# Patient Record
Sex: Female | Born: 1955 | Race: White | Hispanic: No | Marital: Married | Smoking: Former smoker
Health system: Southern US, Community
[De-identification: ages and names within clinical notes are randomized; demographics above are authoritative.]

## PROBLEM LIST (undated history)

## (undated) DIAGNOSIS — Z8709 Personal history of other diseases of the respiratory system: Secondary | ICD-10-CM

## (undated) DIAGNOSIS — T7840XA Allergy, unspecified, initial encounter: Secondary | ICD-10-CM

## (undated) DIAGNOSIS — G5603 Carpal tunnel syndrome, bilateral upper limbs: Secondary | ICD-10-CM

## (undated) DIAGNOSIS — Z8759 Personal history of other complications of pregnancy, childbirth and the puerperium: Secondary | ICD-10-CM

## (undated) DIAGNOSIS — E782 Mixed hyperlipidemia: Secondary | ICD-10-CM

## (undated) DIAGNOSIS — N183 Chronic kidney disease, stage 3 unspecified: Secondary | ICD-10-CM

## (undated) DIAGNOSIS — J302 Other seasonal allergic rhinitis: Secondary | ICD-10-CM

## (undated) DIAGNOSIS — L281 Prurigo nodularis: Secondary | ICD-10-CM

## (undated) DIAGNOSIS — G894 Chronic pain syndrome: Secondary | ICD-10-CM

## (undated) DIAGNOSIS — M797 Fibromyalgia: Secondary | ICD-10-CM

## (undated) DIAGNOSIS — R011 Cardiac murmur, unspecified: Secondary | ICD-10-CM

## (undated) DIAGNOSIS — G43909 Migraine, unspecified, not intractable, without status migrainosus: Secondary | ICD-10-CM

## (undated) DIAGNOSIS — M25562 Pain in left knee: Secondary | ICD-10-CM

## (undated) DIAGNOSIS — K219 Gastro-esophageal reflux disease without esophagitis: Secondary | ICD-10-CM

## (undated) DIAGNOSIS — R7303 Prediabetes: Secondary | ICD-10-CM

## (undated) DIAGNOSIS — N2889 Other specified disorders of kidney and ureter: Secondary | ICD-10-CM

## (undated) DIAGNOSIS — I10 Essential (primary) hypertension: Secondary | ICD-10-CM

## (undated) DIAGNOSIS — M170 Bilateral primary osteoarthritis of knee: Secondary | ICD-10-CM

## (undated) DIAGNOSIS — R7301 Impaired fasting glucose: Secondary | ICD-10-CM

## (undated) DIAGNOSIS — Q600 Renal agenesis, unilateral: Secondary | ICD-10-CM

## (undated) DIAGNOSIS — M25561 Pain in right knee: Secondary | ICD-10-CM

## (undated) DIAGNOSIS — H269 Unspecified cataract: Secondary | ICD-10-CM

## (undated) DIAGNOSIS — K649 Unspecified hemorrhoids: Secondary | ICD-10-CM

## (undated) DIAGNOSIS — M47816 Spondylosis without myelopathy or radiculopathy, lumbar region: Secondary | ICD-10-CM

## (undated) DIAGNOSIS — C801 Malignant (primary) neoplasm, unspecified: Secondary | ICD-10-CM

## (undated) DIAGNOSIS — H8110 Benign paroxysmal vertigo, unspecified ear: Secondary | ICD-10-CM

## (undated) DIAGNOSIS — E663 Overweight: Secondary | ICD-10-CM

## (undated) DIAGNOSIS — Z85528 Personal history of other malignant neoplasm of kidney: Secondary | ICD-10-CM

## (undated) HISTORY — DX: Gastro-esophageal reflux disease without esophagitis: K21.9

## (undated) HISTORY — DX: Carpal tunnel syndrome, bilateral upper limbs: G56.03

## (undated) HISTORY — DX: Bilateral primary osteoarthritis of knee: M17.0

## (undated) HISTORY — DX: Chronic kidney disease, stage 3 unspecified: N18.30

## (undated) HISTORY — DX: Pain in left knee: M25.562

## (undated) HISTORY — DX: Other specified disorders of kidney and ureter: N28.89

## (undated) HISTORY — PX: TONSILLECTOMY: SUR1361

## (undated) HISTORY — DX: Personal history of other diseases of the respiratory system: Z87.09

## (undated) HISTORY — DX: Chronic pain syndrome: G89.4

## (undated) HISTORY — DX: Other seasonal allergic rhinitis: J30.2

## (undated) HISTORY — DX: Cardiac murmur, unspecified: R01.1

## (undated) HISTORY — DX: Migraine, unspecified, not intractable, without status migrainosus: G43.909

## (undated) HISTORY — PX: OTHER SURGICAL HISTORY: SHX169

## (undated) HISTORY — PX: TUBAL LIGATION: SHX77

## (undated) HISTORY — DX: Impaired fasting glucose: R73.01

## (undated) HISTORY — DX: Prurigo nodularis: L28.1

## (undated) HISTORY — DX: Spondylosis without myelopathy or radiculopathy, lumbar region: M47.816

## (undated) HISTORY — DX: Unspecified cataract: H26.9

## (undated) HISTORY — DX: Pain in right knee: M25.561

## (undated) HISTORY — DX: Mixed hyperlipidemia: E78.2

## (undated) HISTORY — DX: Personal history of other complications of pregnancy, childbirth and the puerperium: Z87.59

## (undated) HISTORY — DX: Allergy, unspecified, initial encounter: T78.40XA

## (undated) HISTORY — DX: Renal agenesis, unilateral: Q60.0

## (undated) HISTORY — DX: Personal history of other malignant neoplasm of kidney: Z85.528

## (undated) HISTORY — DX: Benign paroxysmal vertigo, unspecified ear: H81.10

## (undated) HISTORY — DX: Fibromyalgia: M79.7

## (undated) HISTORY — DX: Chronic kidney disease, stage 3 (moderate): N18.3

## (undated) HISTORY — DX: Malignant (primary) neoplasm, unspecified: C80.1

## (undated) HISTORY — DX: Overweight: E66.3

---

## 2001-08-23 ENCOUNTER — Other Ambulatory Visit: Admission: RE | Admit: 2001-08-23 | Discharge: 2001-08-23 | Payer: Self-pay | Admitting: Gynecology

## 2009-09-07 ENCOUNTER — Encounter: Admission: RE | Admit: 2009-09-07 | Discharge: 2009-09-07 | Payer: Self-pay | Admitting: Family Medicine

## 2010-08-04 ENCOUNTER — Encounter: Payer: Self-pay | Admitting: Family Medicine

## 2011-01-30 ENCOUNTER — Encounter: Payer: Self-pay | Admitting: Family Medicine

## 2011-01-31 ENCOUNTER — Encounter: Payer: Self-pay | Admitting: Family Medicine

## 2011-01-31 ENCOUNTER — Ambulatory Visit (INDEPENDENT_AMBULATORY_CARE_PROVIDER_SITE_OTHER): Payer: BC Managed Care – PPO | Admitting: Family Medicine

## 2011-01-31 ENCOUNTER — Other Ambulatory Visit: Payer: Self-pay | Admitting: Family Medicine

## 2011-01-31 VITALS — BP 148/84 | HR 75 | Temp 98.1°F | Ht 62.25 in | Wt 169.0 lb

## 2011-01-31 DIAGNOSIS — Z8759 Personal history of other complications of pregnancy, childbirth and the puerperium: Secondary | ICD-10-CM

## 2011-01-31 DIAGNOSIS — Z8639 Personal history of other endocrine, nutritional and metabolic disease: Secondary | ICD-10-CM

## 2011-01-31 DIAGNOSIS — Z862 Personal history of diseases of the blood and blood-forming organs and certain disorders involving the immune mechanism: Secondary | ICD-10-CM

## 2011-01-31 DIAGNOSIS — R5383 Other fatigue: Secondary | ICD-10-CM

## 2011-01-31 DIAGNOSIS — K219 Gastro-esophageal reflux disease without esophagitis: Secondary | ICD-10-CM

## 2011-01-31 DIAGNOSIS — R1310 Dysphagia, unspecified: Secondary | ICD-10-CM

## 2011-01-31 DIAGNOSIS — T7840XA Allergy, unspecified, initial encounter: Secondary | ICD-10-CM

## 2011-01-31 DIAGNOSIS — Z0000 Encounter for general adult medical examination without abnormal findings: Secondary | ICD-10-CM

## 2011-01-31 DIAGNOSIS — IMO0001 Reserved for inherently not codable concepts without codable children: Secondary | ICD-10-CM

## 2011-01-31 DIAGNOSIS — R030 Elevated blood-pressure reading, without diagnosis of hypertension: Secondary | ICD-10-CM

## 2011-01-31 DIAGNOSIS — M25569 Pain in unspecified knee: Secondary | ICD-10-CM

## 2011-01-31 DIAGNOSIS — R5381 Other malaise: Secondary | ICD-10-CM

## 2011-01-31 DIAGNOSIS — E663 Overweight: Secondary | ICD-10-CM | POA: Insufficient documentation

## 2011-01-31 DIAGNOSIS — M25561 Pain in right knee: Secondary | ICD-10-CM

## 2011-01-31 DIAGNOSIS — H547 Unspecified visual loss: Secondary | ICD-10-CM

## 2011-01-31 MED ORDER — RANITIDINE HCL 300 MG PO TABS: 300.0000 mg | ORAL_TABLET | Freq: Every day | ORAL | Status: DC

## 2011-01-31 NOTE — Patient Instructions (Addendum)
Heartburn Heartburn is a painful, burning sensation in the chest. It may feel worse in certain positions, such as lying down or bending over. It is caused by stomach acid backing up into the tube that carries food from the mouth down to the stomach (lower esophagus).  CAUSES A number of conditions can cause or worsen heartburn, including:   Pregnancy.   Being overweight (obesity).   A condition called hiatal hernia, in which part or all of the stomach is moved up into the chest through a weakness in the diaphragm muscle.   Alcohol.   Exercise.   Eating just before going to bed.   Overeating.   Medications, including:   Nonsteroidal anti-inflammatory drugs, such as ibuprofen and naproxen.   Aspirin.   Some blood pressure medicines, including beta-blockers, calcium channel blockers, and alpha-blockers.   Nitrates (used to treat angina).   The asthma medication Theophylline.   Certain sedative drugs.   Heartburn may be worse after eating certain foods. These heartburn-causing foods are different for different people, but may include:   Peppers.   Chocolate.   Coffee.   High-fat foods, including fried foods.   Spicy foods.   Garlic, onions.   Citrus fruits, including oranges, grapefruit, lemons and limes.   Food containing tomatoes or tomato products.   Mint.   Carbonated beverages.   Vinegar.  SYMPTOMS  Symptoms may last for a few minutes or a few hours, and can include:  Burning pain in the chest or lower throat.   Bitter taste in the mouth.   Coughing.  DIAGNOSIS If the usual treatments for heartburn do not improve your symptoms, then tests may be done to see if there is another condition present. Possible tests may include:  X-rays.   Endoscopy. This is when a tube with a light and a camera on the end is used to examine the esophagus and the stomach.   Blood, breath, or stool tests may be used to check for bacteria that cause ulcers.   TREATMENT There are a number of non-prescription medicines used to treat heartburn, including:  Antacids.   Acid reducers (also called H-2 blockers).   Proton-pump inhibitors.  HOME CARE INSTRUCTIONS  Raise the head of your bed by putting blocks under the legs.   Eat 2-3 hours before going to bed.   Stop smoking.   Try to reach and maintain a healthy weight.   Do not eat just a few very large meals. Instead, eat many smaller meals throughout the day.   Try to identify foods and beverages that make your symptoms worse, and avoid these.   Avoid tight clothing.   Do not exercise right after eating.  SEEK IMMEDIATE MEDICAL CARE IF YOU:  Have severe chest pain that goes down your arm, or into your jaw or neck.   Feel sweaty, dizzy, or lightheaded.   Are short of breath.   Throw up (vomit) blood.   Have difficulty or pain with swallowing.   Have bloody or black, tarry stools.   Have bouts of heartburn more than three times a week for more than two weeks.  Document Released: 11/16/2008 Document Re-Released: 09/24/2009 [facility] Patient Information 2011 [place], [place]. Consider Alavert daily or try Cetirizine twice a day

## 2011-02-01 LAB — CBC WITH DIFFERENTIAL/PLATELET
Basophils Absolute: 0 10*3/uL (ref 0.0–0.1)
Eosinophils Absolute: 0.2 10*3/uL (ref 0.0–0.7)
Hemoglobin: 13.3 g/dL (ref 12.0–15.0)
Lymphocytes Relative: 43 % (ref 12–46)
MCH: 30.9 pg (ref 26.0–34.0)
MCHC: 32.8 g/dL (ref 30.0–36.0)
Neutro Abs: 3.8 10*3/uL (ref 1.7–7.7)
Platelets: 313 10*3/uL (ref 150–400)
RBC: 4.3 MIL/uL (ref 3.87–5.11)
RDW: 13.5 % (ref 11.5–15.5)
WBC: 7.9 10*3/uL (ref 4.0–10.5)

## 2011-02-01 LAB — HEPATIC FUNCTION PANEL
ALT: 15 U/L (ref 0–35)
Bilirubin, Direct: 0.1 mg/dL (ref 0.0–0.3)
Indirect Bilirubin: 0.5 mg/dL (ref 0.0–0.9)
Total Protein: 7.4 g/dL (ref 6.0–8.3)

## 2011-02-01 LAB — BASIC METABOLIC PANEL
BUN: 13 mg/dL (ref 6–23)
Chloride: 102 mEq/L (ref 96–112)
Glucose, Bld: 87 mg/dL (ref 70–99)
Sodium: 139 mEq/L (ref 135–145)

## 2011-02-01 LAB — LIPID PANEL
HDL: 77 mg/dL (ref >39)
VLDL: 41 mg/dL — ABNORMAL HIGH (ref 0–40)

## 2011-02-01 LAB — TSH: TSH: 0.901 u[IU]/mL (ref 0.350–4.500)

## 2011-02-01 LAB — T4, FREE: Free T4: 1.16 ng/dL (ref 0.80–1.80)

## 2011-02-05 ENCOUNTER — Encounter: Payer: Self-pay | Admitting: Family Medicine

## 2011-02-05 DIAGNOSIS — M25561 Pain in right knee: Secondary | ICD-10-CM

## 2011-02-05 DIAGNOSIS — Z8759 Personal history of other complications of pregnancy, childbirth and the puerperium: Secondary | ICD-10-CM

## 2011-02-05 DIAGNOSIS — Z0000 Encounter for general adult medical examination without abnormal findings: Secondary | ICD-10-CM | POA: Insufficient documentation

## 2011-02-05 DIAGNOSIS — H547 Unspecified visual loss: Secondary | ICD-10-CM | POA: Insufficient documentation

## 2011-02-05 DIAGNOSIS — M25562 Pain in left knee: Secondary | ICD-10-CM

## 2011-02-05 HISTORY — DX: Personal history of other complications of pregnancy, childbirth and the puerperium: Z87.59

## 2011-02-05 HISTORY — DX: Pain in right knee: M25.561

## 2011-02-05 HISTORY — DX: Pain in left knee: M25.562

## 2011-02-05 NOTE — Progress Notes (Signed)
[name] [id number] Dec 29, 1955 02/05/2011      Progress Note New Patient  Subjective  Chief Complaint  Chief Complaint  Patient presents with  . Annual Exam    neck problems    HPI  Patient is a 55 year old Caucasian female in today for urgent appointment. She has her blood pressures recently been trending up. Denies headaches, chest pain, palpitations, shortness of breath. Has swelling with some heartburn. Some epigastric discomfort some bloating some belching. No change in bowel habits, bloody or or stool. No anorexia nausea or diarrhea. No recent illness, fevers, chills. She follows with Dr. Orvan Falconer at the [facility] for her Paps and mammograms last exam was roughly 2 years ago. She has some trouble with stiffness and discomfort in her knees at times but no swelling erythema or injury. Did have some difficulties with a corn on his left foot. As a child she had trouble with recurrent strep and bronchitis but has not had difficulty as an adult. Does trouble with allergies intermittently with congestion and pruritus responds well to sublingual Alavert.  Past Medical History  Diagnosis Date  . Overweight   . History of bronchitis     childhood, wood stove and coal heat  . History of galactorrhea 02/05/2011  . Knee pain, bilateral 02/05/2011  . Allergy   . GERD (gastroesophageal reflux disease)   . Heart murmur   . Preventative health care 02/05/2011  . Elevated BP 02/05/2011    Past Surgical History  Procedure Date  . Cesarean section 1983  . Tubal ligation     Family History  Problem Relation Age of Onset  . Thyroid disease Mother   . Heart disease Mother     CHF, heart murmur, PAD  . Kidney disease Mother     RAS  . Cancer Father     colon  . Cancer Sister     cervical, breast  . Thyroid disease Sister   . Heart disease Brother   . Thyroid disease Maternal Grandfather   . Cancer Maternal Grandfather   . Diabetes Paternal Grandmother   . Cancer Paternal  Grandmother     breast  . Heart disease Paternal Grandmother   . Heart disease Paternal Grandfather   . Diabetes Paternal Grandfather   . Hypertension Paternal Grandfather   . Thyroid disease Sister   . Diabetes Sister   . Diabetes Sister   . Hypertension Sister   . Cancer Paternal Aunt     breast cancer  . Heart disease Maternal Grandmother   . Thyroid disease Maternal Grandmother   . Cancer Sister     kidney    History   Social History  . Marital Status: Married    Spouse Name: Sharl Ma    Number of Children: 3  . Years of Education: N/A   Occupational History  . Not on file.   Social History Main Topics  . Smoking status: Former Smoker -- 2.0 packs/day for 5 years    Types: Cigarettes    Quit date: 07/14/1981  . Smokeless tobacco: Never Used  . Alcohol Use: No  . Drug Use: No  . Sexually Active: Not on file   Other Topics Concern  . Not on file   Social History Narrative  . No narrative on file    No current outpatient prescriptions on file prior to visit.    Allergies  Allergen Reactions  . Codeine Nausea Only    Review of Systems  Review of Systems  Constitutional: Negative  for fever, chills and malaise/fatigue.  HENT: Positive for congestion. Negative for hearing loss and nosebleeds.   Eyes: Negative for pain and discharge.  Respiratory: Negative for cough, sputum production, shortness of breath and wheezing.   Cardiovascular: Negative for chest pain, palpitations and leg swelling.  Gastrointestinal: Positive for heartburn and nausea. Negative for vomiting, abdominal pain, diarrhea, constipation and blood in stool.  Genitourinary: Negative for dysuria, urgency, frequency and hematuria.  Musculoskeletal: Positive for joint pain. Negative for myalgias, back pain and falls.       B/l knees  Skin: Negative for rash.  Neurological: Negative for dizziness, tremors, sensory change, focal weakness, loss of consciousness, weakness and headaches.    Endo/Heme/Allergies: Negative for polydipsia. Does not bruise/bleed easily.  Psychiatric/Behavioral: Negative for depression and suicidal ideas. The patient is not nervous/anxious and does not have insomnia.     Objective  BP 148/84  Pulse 75  Temp(Src) 98.1 F (36.7 C) (Oral)  Ht 5' 2.25" (1.581 m)  Wt 169 lb (76.658 kg)  BMI 30.66 kg/m2  SpO2 97%  Physical Exam  Physical Exam  Constitutional: She is oriented to person, place, and time and well-developed, well-nourished, and in no distress. No distress.  HENT:  Head: Normocephalic and atraumatic.  Right Ear: External ear normal.  Left Ear: External ear normal.  Nose: Nose normal.  Mouth/Throat: Oropharynx is clear and moist. No oropharyngeal exudate.  Eyes: Conjunctivae are normal. Pupils are equal, round, and reactive to light. Right eye exhibits no discharge. Left eye exhibits no discharge. No scleral icterus.  Neck: Normal range of motion. Neck supple. No thyromegaly present.  Cardiovascular: Normal rate, regular rhythm, normal heart sounds and intact distal pulses.   No murmur heard. Pulmonary/Chest: Effort normal and breath sounds normal. No respiratory distress. She has no wheezes. She has no rales.  Abdominal: Soft. Bowel sounds are normal. She exhibits no distension and no mass. There is no tenderness.  Musculoskeletal: Normal range of motion. She exhibits no edema and no tenderness.  Lymphadenopathy:    She has no cervical adenopathy.  Neurological: She is alert and oriented to person, place, and time. She has normal reflexes. No cranial nerve deficit. Coordination normal.  Skin: Skin is warm and dry. No rash noted. She is not diaphoretic.  Psychiatric: Mood, memory and affect normal.       Assessment & Plan  Allergic state Symptoms vary patient encouraged to try Zyrtec once to twice daily when symptoms flare.  Preventative health care Encouraged her to proceed with annual fasting labs prior to next visit.    Knee pain, bilateral Intermittent, stiff as well at times, consider fish oil caps and stay as active as tolerated.  Elevated BP Patient encouraged to minimize sodium and caffeine until seen again, will continue to monitor, patient will notify us if she becomes symptomatic  GERD (gastroesophageal reflux disease) Avoid trans fats, offending foods, consider Ginger prn and Tums prn, if symptoms worsen can use Ranitidine prescription provided

## 2011-02-05 NOTE — Assessment & Plan Note (Addendum)
Avoid trans fats, offending foods, consider Ginger prn and Tums prn, if symptoms worsen can use Ranitidine prescription provided. She does note some dysphagia at times.

## 2011-02-05 NOTE — Assessment & Plan Note (Signed)
Intermittent, stiff as well at times, consider fish oil caps and stay as active as tolerated.

## 2011-02-05 NOTE — Assessment & Plan Note (Signed)
Patient encouraged to minimize sodium and caffeine until seen again, will continue to monitor, patient will notify us if she becomes symptomatic

## 2011-02-05 NOTE — Assessment & Plan Note (Signed)
Encouraged her to proceed with annual fasting labs prior to next visit.

## 2011-02-05 NOTE — Assessment & Plan Note (Signed)
Symptoms vary patient encouraged to try Zyrtec once to twice daily when symptoms flare.

## 2011-02-26 ENCOUNTER — Other Ambulatory Visit: Payer: BC Managed Care – PPO

## 2011-03-05 ENCOUNTER — Encounter: Payer: Self-pay | Admitting: Family Medicine

## 2011-03-05 ENCOUNTER — Ambulatory Visit (INDEPENDENT_AMBULATORY_CARE_PROVIDER_SITE_OTHER): Payer: BC Managed Care – PPO | Admitting: Family Medicine

## 2011-03-05 VITALS — BP 139/80 | HR 63 | Temp 98.0°F | Ht 62.25 in | Wt 166.1 lb

## 2011-03-05 DIAGNOSIS — R030 Elevated blood-pressure reading, without diagnosis of hypertension: Secondary | ICD-10-CM

## 2011-03-05 DIAGNOSIS — J0391 Acute recurrent tonsillitis, unspecified: Secondary | ICD-10-CM

## 2011-03-05 DIAGNOSIS — E785 Hyperlipidemia, unspecified: Secondary | ICD-10-CM

## 2011-03-05 DIAGNOSIS — J039 Acute tonsillitis, unspecified: Secondary | ICD-10-CM

## 2011-03-05 DIAGNOSIS — Z8709 Personal history of other diseases of the respiratory system: Secondary | ICD-10-CM

## 2011-03-05 DIAGNOSIS — IMO0001 Reserved for inherently not codable concepts without codable children: Secondary | ICD-10-CM

## 2011-03-05 NOTE — Patient Instructions (Signed)
Hypercholesterolemia High Blood Cholesterol Cholesterol is a white, waxy, fat-like protein needed by your body in small amounts. The liver makes all the cholesterol you need. It is carried from the liver by the blood through the blood vessels. Deposits (plaque) may build up on blood vessel walls. This makes the arteries narrower and stiffer. Plaque increases the risk for heart attack and stroke. You cannot feel your cholesterol level even if it is very high. The only way to know is by a blood test to check your lipid (fats) levels. Once you know your cholesterol levels, you should keep a record of the test results. Work with your caregiver to to keep your levels in the desired range. WHAT THE RESULTS MEAN:  Total cholesterol is a rough measure of all the cholesterol in your blood.   LDL is the so-called bad cholesterol. This is the type that deposits cholesterol in the walls of the arteries. You want this level to be low.   HDL is the good cholesterol because it cleans the arteries and carries the LDL away. You want this level to be high.   Triglycerides are fat that the body can either burn for energy or store. High levels are closely linked to heart disease.  DESIRED LEVELS:  Total cholesterol below 200.   LDL below 100 for people at risk, below 70 for very high risk.   HDL above 50 is good, above 60 is best.   Triglycerides below 150.  HOW TO LOWER YOUR CHOLESTEROL:  Diet.   Choose fish or white meat chicken and [place], roasted or baked. Limit fatty cuts of red meat, fried foods, and processed meats, such as sausage and lunch meat.   Eat lots of fresh fruits and vegetables. Choose whole grains, beans, pasta, potatoes and cereals.   Use only small amounts of olive, corn or canola oils. Avoid butter, mayonnaise, shortening or palm kernel oils. Avoid foods with trans-fats.   Use skim/nonfat milk and low-fat/nonfat yogurt and cheeses. Avoid whole milk, cream, ice cream, egg yolks and  cheeses. Healthy desserts include angel food cake, gingersnaps, animal crackers, hard candy, popsicles, and low-fat/nonfat frozen yogurt. Avoid pastries, cakes, pies and cookies.   Exercise.   A regular program helps decrease LDL and raises HDL.   Helps with weight control.   Do things that increase your activity level like gardening, walking, or taking the stairs.   Medication.   May be prescribed by your caregiver to help lowering cholesterol and the risk for heart disease.   You may need medicine even if your levels are normal if you have several risk factors.  HOME CARE INSTRUCTIONS  Follow your diet and exercise programs as suggested by your caregiver.   Take medications as directed.   Have blood work done when your caregiver feels it is necessary.  MAKE SURE YOU:   Understand these instructions.   Will watch your condition.   Will get help right away if you are not doing well or get worse.  Document Released: 06/30/2005 Document Re-Released: 06/12/2008 [facility] Patient Information 2011 [place], [place]. Try probiotic caps daily, consider vingegar cleanse

## 2011-03-10 ENCOUNTER — Encounter: Payer: Self-pay | Admitting: Family Medicine

## 2011-03-10 NOTE — Progress Notes (Signed)
[name] [id number] [birth year] 03/10/2011      Progress Note-Follow Up  Subjective  Chief Complaint  Chief Complaint  Patient presents with  . Follow-up    1 month follow up    HPI  Patient is a 55 yo Caucasian female. Since her last visit she reports feeling well. She did have a mild sore throat no obvious fevers or chills. She has had some mild nasal congestion but no green rhinorrhea. No headache no ear pain. No recent illness, fevers, chills, chest pain, palpitations, shortness of breath since her last visit. She has not tried any OTC meds for the nasal congestion.  Past Medical History  Diagnosis Date  . Overweight   . History of bronchitis     childhood, wood stove and coal heat  . History of galactorrhea 02/05/2011  . Knee pain, bilateral 02/05/2011  . Allergy   . GERD (gastroesophageal reflux disease)   . Heart murmur   . Preventative health care 02/05/2011  . Elevated BP 02/05/2011  . Recurrent acute tonsillitis 03/05/2011    Past Surgical History  Procedure Date  . Cesarean section 1983  . Tubal ligation     Family History  Problem Relation Age of Onset  . Thyroid disease Mother   . Heart disease Mother     CHF, heart murmur, PAD  . Kidney disease Mother     RAS  . Cancer Father     colon  . Cancer Sister     cervical, breast  . Thyroid disease Sister   . Heart disease Brother   . Thyroid disease Maternal Grandfather   . Cancer Maternal Grandfather   . Diabetes Paternal Grandmother   . Cancer Paternal Grandmother     breast  . Heart disease Paternal Grandmother   . Heart disease Paternal Grandfather   . Diabetes Paternal Grandfather   . Hypertension Paternal Grandfather   . Thyroid disease Sister   . Diabetes Sister   . Diabetes Sister   . Hypertension Sister   . Cancer Paternal Aunt     breast cancer  . Heart disease Maternal Grandmother   . Thyroid disease Maternal Grandmother   . Cancer Sister     kidney  . Asthma Daughter   .  Allergies Daughter   . Lung disease Son     recurrent bronchitis  . Lung disease Daughter     recurrent bronchitis    History   Social History  . Marital Status: Married    Spouse Name: Sharl Ma    Number of Children: 3  . Years of Education: N/A   Occupational History  . Not on file.   Social History Main Topics  . Smoking status: Former Smoker -- 2.0 packs/day for 5 years    Types: Cigarettes    Quit date: 07/14/1981  . Smokeless tobacco: Never Used  . Alcohol Use: No  . Drug Use: No  . Sexually Active: Not on file   Other Topics Concern  . Not on file   Social History Narrative  . No narrative on file    Current Outpatient Prescriptions on File Prior to Visit  Medication Sig Dispense Refill  . acetaminophen (TYLENOL) 500 MG tablet Take 500 mg by mouth every 6 (six) hours as needed.        Marland Kitchen GREEN TEA, CAMILLIA SINENSIS, PO Take 1 tablet by mouth daily.        Marland Kitchen ibuprofen (ADVIL,MOTRIN) 200 MG tablet Take 200 mg by mouth every 6 (  six) hours as needed.        . ranitidine (ZANTAC) 300 MG tablet Take 1 tablet (300 mg total) by mouth at bedtime.  30 tablet  1    Allergies  Allergen Reactions  . Codeine Nausea Only    Review of Systems  Review of Systems  Constitutional: Negative for fever and malaise/fatigue.  HENT: Positive for sore throat. Negative for congestion.   Eyes: Negative for discharge.  Respiratory: Negative for shortness of breath.   Cardiovascular: Negative for chest pain, palpitations and leg swelling.  Gastrointestinal: Negative for nausea, abdominal pain and diarrhea.  Genitourinary: Negative for dysuria.  Musculoskeletal: Negative for falls.  Skin: Negative for rash.  Neurological: Negative for loss of consciousness and headaches.  Endo/Heme/Allergies: Negative for polydipsia.  Psychiatric/Behavioral: Negative for depression and suicidal ideas. The patient is not nervous/anxious and does not have insomnia.     Objective  BP 139/80   Pulse 63  Temp(Src) 98 F (36.7 C) (Oral)  Ht 5' 2.25" (1.581 m)  Wt 166 lb 1.9 oz (75.352 kg)  BMI 30.14 kg/m2  SpO2 98%  Physical Exam  Physical Exam  Constitutional: She is oriented to person, place, and time and well-developed, well-nourished, and in no distress. No distress.  HENT:  Head: Normocephalic and atraumatic.  Eyes: Conjunctivae are normal.  Neck: Neck supple. No thyromegaly present.  Cardiovascular: Normal rate, regular rhythm and normal heart sounds.   No murmur heard. Pulmonary/Chest: Effort normal and breath sounds normal. She has no wheezes.  Abdominal: She exhibits no distension and no mass.  Musculoskeletal: She exhibits no edema.  Lymphadenopathy:    She has no cervical adenopathy.  Neurological: She is alert and oriented to person, place, and time.  Skin: Skin is warm and dry. No rash noted. She is not diaphoretic.  Psychiatric: Memory, affect and judgment normal.    Lab Results  Component Value Date   TSH 0.901 01/31/2011   Lab Results  Component Value Date   WBC 7.9 01/31/2011   HGB 13.3 01/31/2011   HCT 40.6 01/31/2011   MCV 94.4 01/31/2011   PLT 313 01/31/2011   Lab Results  Component Value Date   CREATININE 0.87 01/31/2011   BUN 13 01/31/2011   NA 139 01/31/2011   K 4.3 01/31/2011   CL 102 01/31/2011   CO2 28 01/31/2011   Lab Results  Component Value Date   ALT 15 01/31/2011   AST 15 01/31/2011   ALKPHOS 89 01/31/2011   BILITOT 0.6 01/31/2011   Lab Results  Component Value Date   CHOL 247* 01/31/2011   Lab Results  Component Value Date   HDL 77 01/31/2011   Lab Results  Component Value Date   LDLCALC 129* 01/31/2011   Lab Results  Component Value Date   TRIG 203* 01/31/2011   Lab Results  Component Value Date   CHOLHDL 3.2 01/31/2011     Assessment & Plan  Recurrent acute tonsillitis Patient requesting referral to ENT for further evaluation of her enlarged tonsils and recurrent tonsillitis, she describes quite a bit of food  retention and halitosis and she reports ENTs have recommended tonsillectomy in the past but now she is ready to proceed, will order today  Elevated BP Adequate control at today's visit. Minimize sodium and maintain 8 hours of sleep nightly  Hyperlipidemia SIgnificantly elevated numbers, patient hesitant to start a Statin, avoid trans fats, increase exercise, Increase fish oil and fiber supplements. Continue to monitor

## 2011-03-10 NOTE — Assessment & Plan Note (Signed)
Patient requesting referral to ENT for further evaluation of her enlarged tonsils and recurrent tonsillitis, she describes quite a bit of food retention and halitosis and she reports ENTs have recommended tonsillectomy in the past but now she is ready to proceed, will order today

## 2011-03-10 NOTE — Assessment & Plan Note (Signed)
SIgnificantly elevated numbers, patient hesitant to start a Statin, avoid trans fats, increase exercise, Increase fish oil and fiber supplements. Continue to monitor

## 2011-03-10 NOTE — Assessment & Plan Note (Signed)
Adequate control at today's visit. Minimize sodium and maintain 8 hours of sleep nightly

## 2011-07-15 DIAGNOSIS — IMO0002 Reserved for concepts with insufficient information to code with codable children: Secondary | ICD-10-CM

## 2011-07-15 HISTORY — DX: Reserved for concepts with insufficient information to code with codable children: IMO0002

## 2011-09-24 ENCOUNTER — Encounter: Payer: Self-pay | Admitting: Family Medicine

## 2011-09-24 ENCOUNTER — Telehealth: Payer: Self-pay

## 2011-09-24 ENCOUNTER — Ambulatory Visit (INDEPENDENT_AMBULATORY_CARE_PROVIDER_SITE_OTHER): Payer: BC Managed Care – PPO | Admitting: Family Medicine

## 2011-09-24 VITALS — BP 174/85 | HR 85 | Temp 97.2°F | Wt 165.0 lb

## 2011-09-24 DIAGNOSIS — IMO0001 Reserved for inherently not codable concepts without codable children: Secondary | ICD-10-CM

## 2011-09-24 DIAGNOSIS — R030 Elevated blood-pressure reading, without diagnosis of hypertension: Secondary | ICD-10-CM

## 2011-09-24 DIAGNOSIS — H6690 Otitis media, unspecified, unspecified ear: Secondary | ICD-10-CM | POA: Insufficient documentation

## 2011-09-24 MED ORDER — OFLOXACIN 0.3 % OT SOLN: OTIC | Status: DC

## 2011-09-24 MED ORDER — HYDROCODONE-ACETAMINOPHEN 5-500 MG PO TABS: 1.0000 | ORAL_TABLET | Freq: Four times a day (QID) | ORAL | Status: DC | PRN

## 2011-09-24 MED ORDER — AMOXICILLIN-POT CLAVULANATE 875-125 MG PO TABS: 1.0000 | ORAL_TABLET | Freq: Two times a day (BID) | ORAL | Status: AC

## 2011-09-24 NOTE — Assessment & Plan Note (Signed)
Pt's home bp monitoring lately (even with a dose of sudafed yesterday) shows 130's over 80s per her report. Monitor and call if bp's like today's office reading persist. Avoid decongestants if at all possible.

## 2011-09-24 NOTE — Progress Notes (Signed)
OFFICE VISIT  09/24/2011   CC:  Chief Complaint  Patient presents with  . ear pain, congestion     HPI:    Patient is a 56 y.o. Caucasian female who presents for left ear pain. One month hx of stopped up off and on and uncomfortable left ear, some left jaw area discomfort yesterday+ST on and off lately, then onset yesterday of runny nose and sneezing.   Left ear does itch and feel swollen at times.  No ear drainage.  No fevers.   Past Medical History  Diagnosis Date  . Overweight   . History of bronchitis     childhood, wood stove and coal heat  . History of galactorrhea 02/05/2011  . Knee pain, bilateral 02/05/2011  . Allergy   . GERD (gastroesophageal reflux disease)   . Heart murmur   . Preventative health care 02/05/2011  . Elevated BP 02/05/2011  . Recurrent acute tonsillitis 03/05/2011    Past Surgical History  Procedure Date  . Cesarean section 1983  . Tubal ligation     Outpatient Prescriptions Prior to Visit  Medication Sig Dispense Refill  . acetaminophen (TYLENOL) 500 MG tablet Take 500 mg by mouth every 6 (six) hours as needed.        Marland Kitchen GREEN TEA, CAMILLIA SINENSIS, PO Take 1 tablet by mouth daily.        Marland Kitchen ibuprofen (ADVIL,MOTRIN) 200 MG tablet Take 200 mg by mouth every 6 (six) hours as needed.        . ranitidine (ZANTAC) 300 MG tablet Take 1 tablet (300 mg total) by mouth at bedtime.  30 tablet  1    Allergies  Allergen Reactions  . Codeine Nausea Only    ROS As per HPI  PE: Blood pressure 174/85, pulse 85, temperature 97.2 F (36.2 C), temperature source Temporal, weight 165 lb (74.844 kg), SpO2 97.00%. Gen: Alert, well appearing.  Patient is oriented to person, place, time, and situation. ENT: left TM with dull, wrinkled appearance, EAC with mild erythema proximal to TM, question of small rupture of TM in posterior lower corner, with no active drainage. Right TM and EAC normal.  No jaw swelling. Throat without erythema or swelling.  LABS:    none  IMPRESSION AND PLAN:  AOM (acute otitis media) Augmentin 875mg  bid x 10d plus floxin otic drops x 10d. Zyrtec 10mg  qd prn for recent URI/allergy sx's. Vicodin 5/500, 1-2 q6h prn pain for severe pain, #30, no RF.  Elevated BP Pt's home bp monitoring lately (even with a dose of sudafed yesterday) shows 130's over 80s per her report. Monitor and call if bp's like today's office reading persist. Avoid decongestants if at all possible.       FOLLOW UP: Return for f/u in office for recheck ear in 10-14d.

## 2011-09-24 NOTE — Telephone Encounter (Signed)
Patient given her work note

## 2011-09-24 NOTE — Assessment & Plan Note (Addendum)
Augmentin 875mg  bid x 10d plus floxin otic drops x 10d. Zyrtec 10mg  qd prn for recent URI/allergy sx's. Vicodin 5/500, 1-2 q6h prn pain for severe pain, #30, no RF.

## 2011-10-08 ENCOUNTER — Ambulatory Visit (INDEPENDENT_AMBULATORY_CARE_PROVIDER_SITE_OTHER): Payer: BC Managed Care – PPO | Admitting: Family Medicine

## 2011-10-08 ENCOUNTER — Ambulatory Visit: Payer: BC Managed Care – PPO | Admitting: Family Medicine

## 2011-10-08 ENCOUNTER — Encounter: Payer: Self-pay | Admitting: Family Medicine

## 2011-10-08 VITALS — BP 133/82 | HR 67 | Temp 97.3°F | Ht 62.25 in | Wt 174.0 lb

## 2011-10-08 DIAGNOSIS — R600 Localized edema: Secondary | ICD-10-CM

## 2011-10-08 DIAGNOSIS — B373 Candidiasis of vulva and vagina: Secondary | ICD-10-CM

## 2011-10-08 DIAGNOSIS — B3731 Acute candidiasis of vulva and vagina: Secondary | ICD-10-CM

## 2011-10-08 DIAGNOSIS — H6690 Otitis media, unspecified, unspecified ear: Secondary | ICD-10-CM

## 2011-10-08 DIAGNOSIS — R609 Edema, unspecified: Secondary | ICD-10-CM

## 2011-10-08 DIAGNOSIS — R319 Hematuria, unspecified: Secondary | ICD-10-CM

## 2011-10-08 DIAGNOSIS — I10 Essential (primary) hypertension: Secondary | ICD-10-CM

## 2011-10-08 LAB — POCT URINALYSIS DIPSTICK
Glucose, UA: NEGATIVE
Ketones, UA: NEGATIVE
Protein, UA: NEGATIVE
Spec Grav, UA: 1.015
pH, UA: 6.5

## 2011-10-08 MED ORDER — FLUCONAZOLE 150 MG PO TABS: 150.0000 mg | ORAL_TABLET | Freq: Once | ORAL | Status: AC

## 2011-10-08 MED ORDER — LOSARTAN POTASSIUM 50 MG PO TABS: ORAL_TABLET | ORAL | Status: DC
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
## 2011-10-08 NOTE — Assessment & Plan Note (Addendum)
Has been on the "cusp" of being dx'd with this for a few years now, but now we have a good 2 wk period of confirmatory home bp's. Stage 1. UA today without protein. Discussed med options: decided to avoid diuretics since she has a bit of nocturia already, also decided to avoid ACE-I b/c of her fear of cough side effect potential. Started generic cozaar 50mg  today, increase to 2 qd after 7d if needed.  Discussed goal bp of <140/90 and outlined clear parameters to call or return for before next appt. F/u in office in 2 wks.

## 2011-10-08 NOTE — Assessment & Plan Note (Signed)
Her ear looks EXCELLENT/NORMAL today s/p a course of augmentin. I reassured her that the remainder of her soreness should gradually dissipate completely.

## 2011-10-13 ENCOUNTER — Other Ambulatory Visit: Payer: Self-pay | Admitting: Family Medicine

## 2011-10-13 LAB — URINE CULTURE

## 2011-10-13 MED ORDER — NITROFURANTOIN MONOHYD MACRO 100 MG PO CAPS: 100.0000 mg | ORAL_CAPSULE | Freq: Two times a day (BID) | ORAL | Status: DC

## 2011-10-13 NOTE — Progress Notes (Signed)
Macrobid sent per lab result note of 3/27.

## 2011-10-15 ENCOUNTER — Ambulatory Visit: Payer: BC Managed Care – PPO | Admitting: Family Medicine

## 2011-10-22 ENCOUNTER — Ambulatory Visit (INDEPENDENT_AMBULATORY_CARE_PROVIDER_SITE_OTHER): Payer: BC Managed Care – PPO | Admitting: Family Medicine

## 2011-10-22 ENCOUNTER — Encounter: Payer: Self-pay | Admitting: Family Medicine

## 2011-10-22 VITALS — BP 131/70 | HR 73 | Ht 62.25 in | Wt 172.0 lb

## 2011-10-22 DIAGNOSIS — N390 Urinary tract infection, site not specified: Secondary | ICD-10-CM | POA: Insufficient documentation

## 2011-10-22 DIAGNOSIS — I10 Essential (primary) hypertension: Secondary | ICD-10-CM

## 2011-10-22 DIAGNOSIS — E785 Hyperlipidemia, unspecified: Secondary | ICD-10-CM

## 2011-10-22 NOTE — Assessment & Plan Note (Signed)
Mildly elevated trigs recently, LDL fine, HDL great. Encouraged low chol/low fat diet, increase exercise. Recheck FLP 42mo.

## 2011-10-22 NOTE — Patient Instructions (Signed)
Take 3 tums per day and 1 women's multivitamin per day for osteoporosis prevention.
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
## 2011-10-22 NOTE — Assessment & Plan Note (Signed)
Control is good. Continue 50mg  losartan qd. F/u 32mo.

## 2011-10-22 NOTE — Assessment & Plan Note (Signed)
Resolved

## 2011-11-03 ENCOUNTER — Other Ambulatory Visit: Payer: Self-pay | Admitting: Obstetrics and Gynecology

## 2011-11-03 DIAGNOSIS — R928 Other abnormal and inconclusive findings on diagnostic imaging of breast: Secondary | ICD-10-CM

## 2011-11-05 ENCOUNTER — Ambulatory Visit
Admission: RE | Admit: 2011-11-05 | Discharge: 2011-11-05 | Disposition: A | Discharge status: Home / Self Care | Payer: BC Managed Care – PPO | Source: Ambulatory Visit | Attending: Obstetrics and Gynecology | Admitting: Obstetrics and Gynecology

## 2011-11-05 DIAGNOSIS — R928 Other abnormal and inconclusive findings on diagnostic imaging of breast: Secondary | ICD-10-CM

## 2011-12-03 ENCOUNTER — Ambulatory Visit: Payer: BC Managed Care – PPO | Admitting: Family Medicine

## 2011-12-04 ENCOUNTER — Other Ambulatory Visit: Payer: Self-pay | Admitting: *Deleted

## 2011-12-04 MED ORDER — LOSARTAN POTASSIUM 50 MG PO TABS: ORAL_TABLET | ORAL | Status: DC

## 2011-12-04 NOTE — Telephone Encounter (Signed)
Faxed refill request received from pharmacy for losartan Last filled by MD on 10/08/11, #[phone] x 1 Last seen on 10/22/11 Follow up 04/21/12 for CPE

## 2012-01-01 ENCOUNTER — Ambulatory Visit (INDEPENDENT_AMBULATORY_CARE_PROVIDER_SITE_OTHER): Payer: Self-pay | Admitting: Family Medicine

## 2012-01-01 ENCOUNTER — Ambulatory Visit (HOSPITAL_BASED_OUTPATIENT_CLINIC_OR_DEPARTMENT_OTHER)
Admission: RE | Admit: 2012-01-01 | Discharge: 2012-01-01 | Disposition: A | Discharge status: Home / Self Care | Payer: BC Managed Care – PPO | Source: Ambulatory Visit | Attending: Family Medicine | Admitting: Family Medicine

## 2012-01-01 ENCOUNTER — Encounter: Payer: Self-pay | Admitting: Family Medicine

## 2012-01-01 VITALS — BP 137/81 | HR 69 | Temp 97.4°F | Ht 62.25 in | Wt 173.0 lb

## 2012-01-01 DIAGNOSIS — IMO0002 Reserved for concepts with insufficient information to code with codable children: Secondary | ICD-10-CM

## 2012-01-01 DIAGNOSIS — M545 Low back pain, unspecified: Secondary | ICD-10-CM | POA: Insufficient documentation

## 2012-01-01 DIAGNOSIS — S139XXA Sprain of joints and ligaments of unspecified parts of neck, initial encounter: Secondary | ICD-10-CM | POA: Insufficient documentation

## 2012-01-01 DIAGNOSIS — IMO0001 Reserved for inherently not codable concepts without codable children: Secondary | ICD-10-CM

## 2012-01-01 DIAGNOSIS — S335XXA Sprain of ligaments of lumbar spine, initial encounter: Secondary | ICD-10-CM

## 2012-01-01 DIAGNOSIS — M542 Cervicalgia: Secondary | ICD-10-CM | POA: Insufficient documentation

## 2012-01-01 DIAGNOSIS — S161XXA Strain of muscle, fascia and tendon at neck level, initial encounter: Secondary | ICD-10-CM

## 2012-01-01 DIAGNOSIS — M7918 Myalgia, other site: Secondary | ICD-10-CM

## 2012-01-01 DIAGNOSIS — S39012A Strain of muscle, fascia and tendon of lower back, initial encounter: Secondary | ICD-10-CM

## 2012-01-01 DIAGNOSIS — S46919A Strain of unspecified muscle, fascia and tendon at shoulder and upper arm level, unspecified arm, initial encounter: Secondary | ICD-10-CM

## 2012-01-01 MED ORDER — CYCLOBENZAPRINE HCL 10 MG PO TABS: 10.0000 mg | ORAL_TABLET | Freq: Three times a day (TID) | ORAL | Status: AC | PRN

## 2012-01-01 NOTE — Patient Instructions (Addendum)
Don't take any OTC pain or anti-inflammitory meds. Take celebrex 200mg , 1 cap with food once daily x 15d (samples).
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
## 2012-01-04 DIAGNOSIS — M7918 Myalgia, other site: Secondary | ICD-10-CM | POA: Insufficient documentation

## 2012-01-04 NOTE — Assessment & Plan Note (Signed)
Cervical spine, shoulders, and lumbar spine regions are the areas most affected/strained. Will check C-spine and L-spine x-rays. Start celebrex 200 mg qd with food--samples given.  Therapeutic expectations and side effect profile of medication discussed today.  Patient's questions answered. Start flexeril 10mg  q8h prn.  Therapeutic expectations and side effect profile of medication discussed today.  Patient's questions answered. Start PT--ordered today [facility] PT). F/u 2 wks.

## 2012-01-05 ENCOUNTER — Telehealth: Payer: Self-pay | Admitting: Family Medicine

## 2012-01-05 NOTE — Telephone Encounter (Signed)
She should be taking celebrex 200mg  daily and flexeril (this is the muscle relaxer). If she does not feel impaired when she takes her hydrocodone/APAP then she may take it for pain control at work.  If it makes her feel impaired, then I guess we could try some tramadol short term.  Let me know. --thx
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
## 2012-01-05 NOTE — Telephone Encounter (Signed)
Dr McGowen please advise. 

## 2012-01-06 NOTE — Telephone Encounter (Signed)
Pt will take Hydrocodone/APAP at work.  She will only take 1/2 tablet.  She has 5/500 tabs.  If this does not work she will call us.

## 2012-01-09 ENCOUNTER — Other Ambulatory Visit: Payer: Self-pay | Admitting: *Deleted

## 2012-01-09 MED ORDER — LOSARTAN POTASSIUM 50 MG PO TABS: ORAL_TABLET | ORAL | Status: DC

## 2012-01-14 ENCOUNTER — Ambulatory Visit: Payer: BC Managed Care – PPO | Admitting: Family Medicine

## 2012-01-28 ENCOUNTER — Ambulatory Visit (INDEPENDENT_AMBULATORY_CARE_PROVIDER_SITE_OTHER): Payer: BC Managed Care – PPO | Admitting: Family Medicine

## 2012-01-28 ENCOUNTER — Encounter: Payer: Self-pay | Admitting: Family Medicine

## 2012-01-28 VITALS — BP 130/79 | HR 96 | Temp 96.8°F | Ht 62.25 in | Wt 177.0 lb

## 2012-01-28 DIAGNOSIS — IMO0001 Reserved for inherently not codable concepts without codable children: Secondary | ICD-10-CM

## 2012-01-28 DIAGNOSIS — M7918 Myalgia, other site: Secondary | ICD-10-CM

## 2012-01-28 NOTE — Assessment & Plan Note (Signed)
She is considerably improved, esp considering that she has not done PE nor has she taken any time off of work. Continue celebrex 200mg  qd (#12 samples given today) with food. Continue prn vicodin 1/2 tab bid and flexeril q8h prn--no rx's for these meds given today. She'll continue a few more chiropracter visits and I recommended she do ROM exercises for neck, shoulders, and low back.
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
## 2012-02-04 ENCOUNTER — Ambulatory Visit (INDEPENDENT_AMBULATORY_CARE_PROVIDER_SITE_OTHER): Payer: BC Managed Care – PPO | Admitting: Family Medicine

## 2012-02-04 ENCOUNTER — Encounter: Payer: Self-pay | Admitting: Family Medicine

## 2012-02-04 VITALS — BP 152/105 | HR 88 | Temp 97.2°F | Ht 62.25 in | Wt 177.8 lb

## 2012-02-04 DIAGNOSIS — J40 Bronchitis, not specified as acute or chronic: Secondary | ICD-10-CM

## 2012-02-04 DIAGNOSIS — E785 Hyperlipidemia, unspecified: Secondary | ICD-10-CM

## 2012-02-04 DIAGNOSIS — J209 Acute bronchitis, unspecified: Secondary | ICD-10-CM

## 2012-02-04 DIAGNOSIS — I10 Essential (primary) hypertension: Secondary | ICD-10-CM

## 2012-02-04 LAB — COMPREHENSIVE METABOLIC PANEL
ALT: 33 U/L (ref 0–35)
Albumin: 4.1 g/dL (ref 3.5–5.2)
CO2: 25 mEq/L (ref 19–32)
Chloride: 102 mEq/L (ref 96–112)
GFR: 89.68 mL/min (ref >60.00)
Glucose, Bld: 87 mg/dL (ref 70–99)
Potassium: 4.1 mEq/L (ref 3.5–5.1)
Sodium: 138 mEq/L (ref 135–145)
Total Bilirubin: 0.5 mg/dL (ref 0.3–1.2)
Total Protein: 7.1 g/dL (ref 6.0–8.3)

## 2012-02-04 LAB — RENAL FUNCTION PANEL
BUN: 13 mg/dL (ref 6–23)
CO2: 25 mEq/L (ref 19–32)
Creatinine, Ser: 0.7 mg/dL (ref 0.4–1.2)
GFR: 89.68 mL/min (ref >60.00)
Glucose, Bld: 87 mg/dL (ref 70–99)
Sodium: 138 mEq/L (ref 135–145)

## 2012-02-04 LAB — LIPID PANEL: Total CHOL/HDL Ratio: 4

## 2012-02-04 LAB — CBC
Hemoglobin: 12.2 g/dL (ref 12.0–15.0)
Platelets: 241 10*3/uL (ref 150.0–400.0)
RBC: 3.84 Mil/uL — ABNORMAL LOW (ref 3.87–5.11)

## 2012-02-04 LAB — TSH: TSH: 1.13 u[IU]/mL (ref 0.35–5.50)

## 2012-02-04 MED ORDER — GUAIFENESIN ER 600 MG PO TB12: 600.0000 mg | ORAL_TABLET | Freq: Two times a day (BID) | ORAL | Status: DC

## 2012-02-04 MED ORDER — AZITHROMYCIN 250 MG PO TABS: ORAL_TABLET | ORAL | Status: AC

## 2012-02-04 MED ORDER — FLUCONAZOLE 150 MG PO TABS: ORAL_TABLET | ORAL | Status: DC

## 2012-02-04 MED ORDER — HYDROCOD POLST-CHLORPHEN POLST 10-8 MG/5ML PO LQCR: 5.0000 mL | Freq: Every evening | ORAL | Status: DC | PRN

## 2012-02-04 NOTE — Patient Instructions (Addendum)

## 2012-02-05 ENCOUNTER — Encounter: Payer: Self-pay | Admitting: Family Medicine

## 2012-02-05 DIAGNOSIS — J209 Acute bronchitis, unspecified: Secondary | ICD-10-CM | POA: Insufficient documentation

## 2012-02-05 NOTE — Assessment & Plan Note (Signed)
Elevated with acute illness, asked to avoid sudafed and dextromethorphan
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
## 2012-02-05 NOTE — Assessment & Plan Note (Addendum)
Started on an antibiotic and mucinex, increase rest and Tussionex prn

## 2012-04-01 ENCOUNTER — Ambulatory Visit (HOSPITAL_BASED_OUTPATIENT_CLINIC_OR_DEPARTMENT_OTHER)
Admission: RE | Admit: 2012-04-01 | Discharge: 2012-04-01 | Disposition: A | Discharge status: Home / Self Care | Payer: BC Managed Care – PPO | Source: Ambulatory Visit | Attending: Family Medicine | Admitting: Family Medicine

## 2012-04-01 ENCOUNTER — Encounter: Payer: Self-pay | Admitting: Family Medicine

## 2012-04-01 ENCOUNTER — Ambulatory Visit (INDEPENDENT_AMBULATORY_CARE_PROVIDER_SITE_OTHER): Payer: BC Managed Care – PPO | Admitting: Family Medicine

## 2012-04-01 VITALS — BP 148/84 | HR 76 | Temp 96.8°F | Ht 62.25 in | Wt 178.0 lb

## 2012-04-01 DIAGNOSIS — M546 Pain in thoracic spine: Secondary | ICD-10-CM

## 2012-04-01 DIAGNOSIS — M549 Dorsalgia, unspecified: Secondary | ICD-10-CM | POA: Insufficient documentation

## 2012-04-01 MED ORDER — HYDROCODONE-ACETAMINOPHEN 5-500 MG PO TABS: ORAL_TABLET | ORAL | Status: DC
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
## 2012-04-04 NOTE — Assessment & Plan Note (Signed)
Myofascial strain. Check T-spine x-ray. I recommended she take a week off from work and do stretches and relative rest. She has flexeril and NSAIDs to take at home as previously instructed when she had low back pain. I rx'd Vicodin 5/500, 1-2 q6h prn severe pain, #45, no RF.  Therapeutic expectations and side effect profile of medication discussed today.  Patient's questions answered.

## 2012-04-09 ENCOUNTER — Ambulatory Visit (INDEPENDENT_AMBULATORY_CARE_PROVIDER_SITE_OTHER): Payer: BC Managed Care – PPO | Admitting: Family Medicine

## 2012-04-09 ENCOUNTER — Other Ambulatory Visit: Payer: Self-pay | Admitting: Family Medicine

## 2012-04-09 ENCOUNTER — Ambulatory Visit (HOSPITAL_BASED_OUTPATIENT_CLINIC_OR_DEPARTMENT_OTHER)
Admission: RE | Admit: 2012-04-09 | Discharge: 2012-04-09 | Disposition: A | Discharge status: Home / Self Care | Payer: BC Managed Care – PPO | Source: Ambulatory Visit | Attending: Family Medicine | Admitting: Family Medicine

## 2012-04-09 ENCOUNTER — Encounter: Payer: Self-pay | Admitting: Family Medicine

## 2012-04-09 VITALS — BP 172/81 | HR 94 | Ht 62.25 in | Wt 180.0 lb

## 2012-04-09 DIAGNOSIS — R0781 Pleurodynia: Secondary | ICD-10-CM

## 2012-04-09 DIAGNOSIS — M546 Pain in thoracic spine: Secondary | ICD-10-CM

## 2012-04-09 DIAGNOSIS — R10812 Left upper quadrant abdominal tenderness: Secondary | ICD-10-CM

## 2012-04-09 DIAGNOSIS — R079 Chest pain, unspecified: Secondary | ICD-10-CM | POA: Insufficient documentation

## 2012-04-09 DIAGNOSIS — I10 Essential (primary) hypertension: Secondary | ICD-10-CM

## 2012-04-09 LAB — LIPASE: Lipase: 15 U/L (ref 11.0–59.0)

## 2012-04-09 LAB — COMPREHENSIVE METABOLIC PANEL
Albumin: 3.7 g/dL (ref 3.5–5.2)
CO2: 26 mEq/L (ref 19–32)
Calcium: 8.9 mg/dL (ref 8.4–10.5)
GFR: 85.5 mL/min (ref >60.00)
Glucose, Bld: 95 mg/dL (ref 70–99)
Potassium: 4.1 mEq/L (ref 3.5–5.1)
Sodium: 139 mEq/L (ref 135–145)
Total Protein: 6.4 g/dL (ref 6.0–8.3)

## 2012-04-09 NOTE — Assessment & Plan Note (Signed)
BP up a bit with pain. I encouraged her to take 2 of her cozaar tabs per day instead of one.
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
## 2012-04-09 NOTE — Assessment & Plan Note (Signed)
Unclear etiology.  Thoracic spine x-ray unremarkable. Will check rib x-ray and since she does have some LUQ tenderness I will check a CMET and lipase. She'll continue her current NSAIDs, pain meds, and muscle relaxers and she'll try to return to work in 3d. If she is not well enough to get through work then I will have her see orthopedics.

## 2012-04-12 ENCOUNTER — Encounter: Payer: Self-pay | Admitting: Family Medicine

## 2012-04-12 ENCOUNTER — Ambulatory Visit (INDEPENDENT_AMBULATORY_CARE_PROVIDER_SITE_OTHER): Payer: BC Managed Care – PPO | Admitting: Family Medicine

## 2012-04-12 VITALS — BP 145/83 | HR 105 | Wt 180.0 lb

## 2012-04-12 DIAGNOSIS — M549 Dorsalgia, unspecified: Secondary | ICD-10-CM

## 2012-04-12 DIAGNOSIS — M546 Pain in thoracic spine: Secondary | ICD-10-CM

## 2012-04-12 DIAGNOSIS — M542 Cervicalgia: Secondary | ICD-10-CM

## 2012-04-12 DIAGNOSIS — M545 Low back pain, unspecified: Secondary | ICD-10-CM

## 2012-04-12 MED ORDER — CYCLOBENZAPRINE HCL 10 MG PO TABS: 10.0000 mg | ORAL_TABLET | Freq: Two times a day (BID) | ORAL | Status: DC | PRN

## 2012-04-12 MED ORDER — OXYCODONE HCL 5 MG PO TABS: ORAL_TABLET | ORAL | Status: DC

## 2012-04-12 NOTE — Assessment & Plan Note (Addendum)
Her course is worsening. I told her I want a specialist to evaluate her. I will refer to physical medicine & rehab MD named Dr. Regino Schultze at [facility].  I will defer further imaging to the specialist. I did have her d/c her vicodin (recent mild LFT elevation, plus inadequate pain control), and I rx'd oxycodone 5mg , 1-2 q6h prn, limit to 6 tabs per day, #60, no RF.  I also RF'd her flexeril 10mg , 1 bid prn, #30, no RF.
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
## 2012-04-13 ENCOUNTER — Encounter: Payer: Self-pay | Admitting: Family Medicine

## 2012-04-14 ENCOUNTER — Other Ambulatory Visit: Payer: BC Managed Care – PPO

## 2012-04-21 ENCOUNTER — Encounter: Payer: BC Managed Care – PPO | Admitting: Family Medicine

## 2012-04-29 ENCOUNTER — Telehealth: Payer: Self-pay | Admitting: Family Medicine

## 2012-04-29 NOTE — Telephone Encounter (Signed)
Pt dropped off short term disability paperwork last Friday and wants to know status.  Advised I did not know about this set of paperwork and I would look into it and call her back.

## 2012-04-29 NOTE — Telephone Encounter (Signed)
Patient is checking on status of disability paperwork.

## 2012-04-29 NOTE — Telephone Encounter (Signed)
Per Dr. Milinda Cave, he has completed this form and given back to [facility].  I will ask Nikki status on 10/18.

## 2012-04-30 NOTE — Telephone Encounter (Signed)
Advised pt that paperwork has been completed, but we can not find in office.  Advised that they have most likely been faxed and sent to our central scanning department.  Pt aware that I will continue to look for these to be scanned into chart and follow up with patient.

## 2012-05-04 NOTE — Telephone Encounter (Signed)
Notified pt per Lowella Bandy forms have been faxed.  Pt would like a copy.

## 2012-05-13 ENCOUNTER — Other Ambulatory Visit: Payer: Self-pay | Admitting: *Deleted

## 2012-05-13 MED ORDER — OXYCODONE HCL 5 MG PO TABS: ORAL_TABLET | ORAL | Status: DC

## 2012-05-13 NOTE — Telephone Encounter (Signed)
Refill request for OXYCODONE Last filled-04/12/12, #60 X 0 Last seen- 04/12/12 Follow up - 2 WEEKS to follow up pain.  Pt has CPE scheduled on 05/26/12. Please advise refills.  Pt is having pain down her hip/leg.  She wants to know who would be MD to do pain management referral, PCP or ortho.  Pt states she is trying to get out and walk to get exercise, but pain is making it difficult.  Pt states she is out of her medication.  Advised pt we have a 48-72 hour refill policy and I can not guarantee her request will be taken care of today.  She voices understanding.

## 2012-05-13 NOTE — Telephone Encounter (Signed)
Pt notified RX ready to pickup and reminded to bring in ID when she comes to pick up.

## 2012-05-19 ENCOUNTER — Other Ambulatory Visit: Payer: BC Managed Care – PPO

## 2012-05-25 DIAGNOSIS — N2889 Other specified disorders of kidney and ureter: Secondary | ICD-10-CM

## 2012-05-25 HISTORY — DX: Other specified disorders of kidney and ureter: N28.89

## 2012-05-26 ENCOUNTER — Encounter: Payer: Self-pay | Admitting: Family Medicine

## 2012-05-26 ENCOUNTER — Ambulatory Visit (INDEPENDENT_AMBULATORY_CARE_PROVIDER_SITE_OTHER): Payer: BC Managed Care – PPO | Admitting: Family Medicine

## 2012-05-26 VITALS — BP 127/75 | HR 80 | Ht 62.25 in | Wt 176.0 lb

## 2012-05-26 DIAGNOSIS — Z0000 Encounter for general adult medical examination without abnormal findings: Secondary | ICD-10-CM

## 2012-05-26 DIAGNOSIS — M546 Pain in thoracic spine: Secondary | ICD-10-CM

## 2012-05-26 DIAGNOSIS — Z23 Encounter for immunization: Secondary | ICD-10-CM

## 2012-05-26 LAB — COMPREHENSIVE METABOLIC PANEL
ALT: 33 U/L (ref 0–35)
AST: 28 U/L (ref 0–37)
Albumin: 3.9 g/dL (ref 3.5–5.2)
CO2: 27 mEq/L (ref 19–32)
Calcium: 9 mg/dL (ref 8.4–10.5)
Chloride: 102 mEq/L (ref 96–112)
Potassium: 3.8 mEq/L (ref 3.5–5.1)

## 2012-05-26 LAB — TSH: TSH: 1.74 u[IU]/mL (ref 0.35–5.50)

## 2012-05-26 LAB — CBC WITH DIFFERENTIAL/PLATELET
Basophils Absolute: 0 10*3/uL (ref 0.0–0.1)
Basophils Relative: 0.6 % (ref 0.0–3.0)
Eosinophils Absolute: 0.1 10*3/uL (ref 0.0–0.7)
Lymphocytes Relative: 49 % — ABNORMAL HIGH (ref 12.0–46.0)
MCHC: 32.2 g/dL (ref 30.0–36.0)
Monocytes Relative: 8.8 % (ref 3.0–12.0)
Neutrophils Relative %: 39.7 % — ABNORMAL LOW (ref 43.0–77.0)
RBC: 4.02 Mil/uL (ref 3.87–5.11)

## 2012-05-26 MED ORDER — CYCLOBENZAPRINE HCL 10 MG PO TABS: 10.0000 mg | ORAL_TABLET | Freq: Two times a day (BID) | ORAL | Status: DC | PRN

## 2012-05-26 NOTE — Assessment & Plan Note (Signed)
Reviewed age and gender appropriate health maintenance issues (prudent diet, regular exercise, health risks of tobacco and excessive alcohol, use of seatbelts, fire alarms in home, use of sunscreen).  Also reviewed age and gender appropriate health screening as well as vaccine recommendations. Tdap given today. Discussed need for her first colonoscopy to screen for colon cancer and we'll pursue this referral to GI when her back pain issue is more stable. Checked routine HM labs today (she is fasting). She is due for her routine GYN health maintenance exam/testing 06/2012 per her report today. F/u back pain 1 mo.

## 2012-05-26 NOTE — Assessment & Plan Note (Signed)
She has mid back, low back, and intermittent radiculopathy pains in legs. She is under the care of Dr.Wang and is on neurontin 300mg  qhs, flexeril 10mg  q8h prn (#30, RF x 1 rx'd again today), and oxycodone prn (no new rx for this given today).  She recently had a back MRI through [facility] yesterday and was called and told to come in to Dr. Juanda Chance today to be worked in --she was not told any specific result of her MRI. She is currently not able to work due to this pain.

## 2012-05-26 NOTE — Patient Instructions (Signed)
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
## 2012-05-27 ENCOUNTER — Encounter: Payer: Self-pay | Admitting: Family Medicine

## 2012-05-27 LAB — LDL CHOLESTEROL, DIRECT: Direct LDL: 170.7 mg/dL

## 2012-06-21 ENCOUNTER — Other Ambulatory Visit: Payer: Self-pay | Admitting: Urology

## 2012-06-23 ENCOUNTER — Ambulatory Visit (INDEPENDENT_AMBULATORY_CARE_PROVIDER_SITE_OTHER): Payer: BC Managed Care – PPO | Admitting: Family Medicine

## 2012-06-23 ENCOUNTER — Encounter: Payer: Self-pay | Admitting: Family Medicine

## 2012-06-23 VITALS — BP 147/84 | HR 85 | Ht 62.25 in | Wt 178.0 lb

## 2012-06-23 DIAGNOSIS — M7918 Myalgia, other site: Secondary | ICD-10-CM

## 2012-06-23 DIAGNOSIS — IMO0001 Reserved for inherently not codable concepts without codable children: Secondary | ICD-10-CM

## 2012-06-23 DIAGNOSIS — N289 Disorder of kidney and ureter, unspecified: Secondary | ICD-10-CM

## 2012-06-23 DIAGNOSIS — N2889 Other specified disorders of kidney and ureter: Secondary | ICD-10-CM | POA: Insufficient documentation

## 2012-06-23 DIAGNOSIS — M549 Dorsalgia, unspecified: Secondary | ICD-10-CM

## 2012-06-23 MED ORDER — GABAPENTIN 300 MG PO CAPS: 300.0000 mg | ORAL_CAPSULE | Freq: Three times a day (TID) | ORAL | Status: DC

## 2012-06-23 MED ORDER — OXYCODONE HCL 5 MG PO TABS: ORAL_TABLET | ORAL | Status: DC

## 2012-06-23 NOTE — Progress Notes (Signed)
OFFICE NOTE  06/23/2012  CC:  Chief Complaint  Patient presents with  . Follow-up    pain     HPI: Patient is a 56 y.o. Caucasian female who is here for follow up on her musculoskeletal pain. She hurts a lot still in the entire back, radiating pain around sides and into abdomen, also low back pain with radiating pain down legs.  Left side affected more than right.  No weakness or paresthesias.  Describes spasms/cramps/legs aching during the night when trying to go to sleep.  Recent MRI of T spine showed one bulging disc but there was no mention of it compressing anything.  MRI L spine at the same time was unremarkable.  She is upset with what her phys med/rehab MD wrote in her short term disability letter, wants to confront him about it at upcoming f/u visit in 2d, says she may not continue to see him after this.  She feels like a neurologist referral is needed.  She takes 2 oxycodone 5mg  tabs at bedtime to try to help get rest. She takes mobic and cyclobenzaprine in the daytime.  Takes one 300mg  gabapentin pill qhs.  She has recently been diagnosed with a renal mass that is believed to be cancer.  Urologist's impression is that the disease is localized and a nephrectomy was recommended.  She has appt for this surgery 07/12/12.  Pertinent PMH:  Past Medical History  Diagnosis Date  . Overweight   . History of bronchitis     childhood, wood stove and coal heat  . History of galactorrhea 02/05/2011  . Knee pain, bilateral 02/05/2011  . Allergy   . GERD (gastroesophageal reflux disease)   . Heart murmur   . Preventative health care 02/05/2011  . Elevated BP 02/05/2011  . Recurrent acute tonsillitis 03/05/2011  . Bronchitis, acute 02/05/2012  . Hyperlipidemia, mixed   . Renal mass, right 05/25/12    Noted on T-spine MRI that was done for ongoing mid/upper back pain by physical med/rehab MD.  Urology re-imaged with a CT scan and confirmed a mass suspicious for malignant tumor so they  recommended nephrectomy.  Their impression was that her dz was localized.    MEDS:  Outpatient Prescriptions Prior to Visit  Medication Sig Dispense Refill  . cyclobenzaprine (FLEXERIL) 10 MG tablet Take 1 tablet (10 mg total) by mouth 2 (two) times daily as needed for muscle spasms.  30 tablet  1  . gabapentin (NEURONTIN) 300 MG capsule Take 1 capsule by mouth Daily.      Marland Kitchen ibuprofen (ADVIL,MOTRIN) 200 MG tablet Take 200 mg by mouth every 6 (six) hours as needed.        Marland Kitchen losartan (COZAAR) 50 MG tablet 1-2 tabs po qd  60 tablet  5  . meloxicam (MOBIC) 7.5 MG tablet Take 1 tablet by mouth Daily.      Marland Kitchen omeprazole (PRILOSEC) 20 MG capsule Take 1 capsule by mouth Daily.      Marland Kitchen oxyCODONE (OXY IR/ROXICODONE) 5 MG immediate release tablet 1-2 tabs po q6h prn pain  60 tablet  0   Last reviewed on 06/23/2012 10:47 AM by Jeoffrey Massed, MD  PE: Blood pressure 147/84, pulse 85, height 5' 2.25" (1.581 m), weight 178 lb (80.74 kg). Gen: Alert, well appearing.  Patient is oriented to person, place, time, and situation. AFFECT: she is nervous, talks with a desperate flare at times.  Displays lucid thought and speech. No further exam today.  IMPRESSION AND PLAN:  Musculoskeletal pain Mostly myofascial pain, but her description of radiating pain is puzzling.  We talked at length today about her pain, how it inhibits normal activity and normal life for her and how frustrated that makes her feel.  She says she really wants to go back to work ASAP but is unable to at this time. Will increase neurontin to 300mg  tid over the next 2 weeks.   Oxycodone 5mg , 1-2 tabs qhs prn, #60, no RF.  She knows that these are to last her 1 mo. I encouraged her to continue to see Dr Regino Schultze.   Spinal pain Believed to be renal cell carcinoma, localized. Urologist is set to do a nephrectomy for her 07/12/12.   Spent 45 min with patient today, with >50% of that time spent in counseling and care coordination for the  problems noted above.  An After Visit Summary was printed and given to the patient.  FOLLOW UP: 6 wks

## 2012-06-23 NOTE — Assessment & Plan Note (Signed)
Believed to be renal cell carcinoma, localized. Urologist is set to do a nephrectomy for her 07/12/12.

## 2012-06-23 NOTE — Assessment & Plan Note (Addendum)
Mostly myofascial pain, but her description of radiating pain is puzzling.  We talked at length today about her pain, how it inhibits normal activity and normal life for her and how frustrated that makes her feel.  She says she really wants to go back to work ASAP but is unable to at this time. Will increase neurontin to 300mg  tid over the next 2 weeks.   Oxycodone 5mg , 1-2 tabs qhs prn, #60, no RF.  She knows that these are to last her 1 mo. I encouraged her to continue to see Dr Regino Schultze.

## 2012-07-01 ENCOUNTER — Encounter (HOSPITAL_COMMUNITY): Payer: Self-pay | Admitting: Pharmacy Technician

## 2012-07-05 ENCOUNTER — Encounter (HOSPITAL_COMMUNITY)
Admission: RE | Admit: 2012-07-05 | Discharge: 2012-07-05 | Disposition: A | Discharge status: Home / Self Care | Payer: BC Managed Care – PPO | Source: Ambulatory Visit | Attending: Urology | Admitting: Urology

## 2012-07-05 ENCOUNTER — Encounter (HOSPITAL_COMMUNITY): Payer: Self-pay

## 2012-07-05 HISTORY — DX: Essential (primary) hypertension: I10

## 2012-07-05 HISTORY — DX: Unspecified hemorrhoids: K64.9

## 2012-07-05 LAB — ABO/RH: ABO/RH(D): O POS

## 2012-07-05 LAB — BASIC METABOLIC PANEL
CO2: 25 mEq/L (ref 19–32)
Calcium: 9.7 mg/dL (ref 8.4–10.5)
Creatinine, Ser: 0.71 mg/dL (ref 0.50–1.10)
GFR calc Af Amer: >90 mL/min (ref >90)
GFR calc non Af Amer: >90 mL/min (ref >90)

## 2012-07-05 LAB — CBC
MCH: 31.1 pg (ref 26.0–34.0)
MCHC: 35 g/dL (ref 30.0–36.0)
MCV: 88.8 fL (ref 78.0–100.0)
Platelets: 243 10*3/uL (ref 150–400)
RDW: 12.7 % (ref 11.5–15.5)

## 2012-07-05 NOTE — Patient Instructions (Addendum)
20 [name]  07/05/2012   Your procedure is scheduled on: 12-30-  -2013  Report to [facility] at     0515   AM .  Call this number if you have problems the morning of surgery: [phone]  Or Presurgical Testing [phone]([place])   Remember: Follow any bowel prep instructions per MD office. For Cpap use: Bring mask and tubing only.   Do not eat food:After Midnight.    Take these medicines the morning of surgery with A SIP OF WATER: Neurontin. Omeprazole. Oxycodone   Do not wear jewelry, make-up or nail polish.  Do not wear lotions, powders, or perfumes. You may wear deodorant.  Do not shave 48 hours prior to surgery.(face and neck okay, no shaving of legs)  Do not bring valuables to the hospital.  Contacts, dentures or bridgework,body piercing,  may not be worn into surgery.  Leave suitcase in the car. After surgery it may be brought to your room.  For patients admitted to the hospital, checkout time is 11:00 AM the day of discharge.   Patients discharged the day of surgery will not be allowed to drive home. Must have responsible person with you x 24 hours once discharged.  Name and phone number of your driver: [name], spouse [phone] cell  Special Instructions: CHG Shower Use Special Wash: see special instructions.(avoid face and genitals)   Please read over the following fact sheets that you were given: MRSA Information, Blood Transfusion fact sheet, Incentive Spirometry Instruction.    Failure to follow these instructions may result in Cancellation of your surgery.   Patient signature_______________________________________________________

## 2012-07-12 ENCOUNTER — Ambulatory Visit (HOSPITAL_COMMUNITY): Payer: BC Managed Care – PPO | Admitting: Registered Nurse

## 2012-07-12 ENCOUNTER — Encounter (HOSPITAL_COMMUNITY): Payer: Self-pay | Admitting: *Deleted

## 2012-07-12 ENCOUNTER — Encounter (HOSPITAL_COMMUNITY)
Admission: RE | Disposition: A | Discharge status: Home / Self Care | Payer: Self-pay | Source: Ambulatory Visit | Attending: Urology

## 2012-07-12 ENCOUNTER — Encounter (HOSPITAL_COMMUNITY): Payer: Self-pay | Admitting: Registered Nurse

## 2012-07-12 ENCOUNTER — Inpatient Hospital Stay (HOSPITAL_COMMUNITY)
Admission: RE | Admit: 2012-07-12 | Discharge: 2012-07-13 | DRG: 303 | Disposition: A | Discharge status: Home / Self Care | Payer: BC Managed Care – PPO | Source: Ambulatory Visit | Attending: Urology | Admitting: Urology

## 2012-07-12 DIAGNOSIS — K219 Gastro-esophageal reflux disease without esophagitis: Secondary | ICD-10-CM | POA: Diagnosis present

## 2012-07-12 DIAGNOSIS — E782 Mixed hyperlipidemia: Secondary | ICD-10-CM | POA: Diagnosis present

## 2012-07-12 DIAGNOSIS — Z23 Encounter for immunization: Secondary | ICD-10-CM

## 2012-07-12 DIAGNOSIS — Z885 Allergy status to narcotic agent status: Secondary | ICD-10-CM

## 2012-07-12 DIAGNOSIS — Z01812 Encounter for preprocedural laboratory examination: Secondary | ICD-10-CM

## 2012-07-12 DIAGNOSIS — R011 Cardiac murmur, unspecified: Secondary | ICD-10-CM | POA: Diagnosis present

## 2012-07-12 DIAGNOSIS — C649 Malignant neoplasm of unspecified kidney, except renal pelvis: Principal | ICD-10-CM | POA: Diagnosis present

## 2012-07-12 DIAGNOSIS — M479 Spondylosis, unspecified: Secondary | ICD-10-CM | POA: Diagnosis present

## 2012-07-12 DIAGNOSIS — E663 Overweight: Secondary | ICD-10-CM | POA: Diagnosis present

## 2012-07-12 DIAGNOSIS — I10 Essential (primary) hypertension: Secondary | ICD-10-CM | POA: Diagnosis present

## 2012-07-12 DIAGNOSIS — Z6833 Body mass index (BMI) 33.0-33.9, adult: Secondary | ICD-10-CM

## 2012-07-12 DIAGNOSIS — Z9089 Acquired absence of other organs: Secondary | ICD-10-CM

## 2012-07-12 DIAGNOSIS — Z79899 Other long term (current) drug therapy: Secondary | ICD-10-CM

## 2012-07-12 DIAGNOSIS — Z87891 Personal history of nicotine dependence: Secondary | ICD-10-CM

## 2012-07-12 DIAGNOSIS — G8929 Other chronic pain: Secondary | ICD-10-CM | POA: Diagnosis present

## 2012-07-12 HISTORY — PX: ROBOT ASSISTED LAPAROSCOPIC NEPHRECTOMY: SHX5140

## 2012-07-12 LAB — HEMOGLOBIN AND HEMATOCRIT, BLOOD
HCT: 34.6 % — ABNORMAL LOW (ref 36.0–46.0)
Hemoglobin: 12 g/dL (ref 12.0–15.0)

## 2012-07-12 LAB — TYPE AND SCREEN
ABO/RH(D): O POS
Antibody Screen: NEGATIVE

## 2012-07-12 SURGERY — ROBOTIC ASSISTED LAPAROSCOPIC NEPHRECTOMY
Anesthesia: General | Laterality: Right | Wound class: Clean

## 2012-07-12 MED ORDER — ONDANSETRON HCL 4 MG/2ML IJ SOLN
INTRAMUSCULAR | Status: DC | PRN
  Administered 2012-07-12: 4 mg via INTRAVENOUS

## 2012-07-12 MED ORDER — LACTATED RINGERS IV SOLN
INTRAVENOUS | Status: DC | PRN
  Administered 2012-07-12: 07:00:00 via INTRAVENOUS

## 2012-07-12 MED ORDER — MIDAZOLAM HCL 5 MG/5ML IJ SOLN
INTRAMUSCULAR | Status: DC | PRN
  Administered 2012-07-12: 2 mg via INTRAVENOUS

## 2012-07-12 MED ORDER — GABAPENTIN 300 MG PO CAPS
300.0000 mg | ORAL_CAPSULE | Freq: Three times a day (TID) | ORAL | Status: DC
  Administered 2012-07-12 – 2012-07-13 (×3): 300 mg via ORAL
  Filled 2012-07-12 (×5): qty 1

## 2012-07-12 MED ORDER — PHENYLEPHRINE HCL 10 MG/ML IJ SOLN
INTRAMUSCULAR | Status: DC | PRN
  Administered 2012-07-12: 40 ug via INTRAVENOUS

## 2012-07-12 MED ORDER — KCL IN DEXTROSE-NACL 10-5-0.45 MEQ/L-%-% IV SOLN
INTRAVENOUS | Status: AC
  Filled 2012-07-12: qty 1000

## 2012-07-12 MED ORDER — GLYCOPYRROLATE 0.2 MG/ML IJ SOLN
INTRAMUSCULAR | Status: DC | PRN
  Administered 2012-07-12: .6 mg via INTRAVENOUS

## 2012-07-12 MED ORDER — ACETAMINOPHEN 10 MG/ML IV SOLN
INTRAVENOUS | Status: AC
  Filled 2012-07-12: qty 100

## 2012-07-12 MED ORDER — PROPOFOL 10 MG/ML IV BOLUS
INTRAVENOUS | Status: DC | PRN
  Administered 2012-07-12: 180 mg via INTRAVENOUS

## 2012-07-12 MED ORDER — INFLUENZA VIRUS VACC SPLIT PF IM SUSP
0.5000 mL | INTRAMUSCULAR | Status: AC
  Administered 2012-07-13: 0.5 mL via INTRAMUSCULAR
  Filled 2012-07-12: qty 0.5

## 2012-07-12 MED ORDER — HYDROMORPHONE HCL PF 1 MG/ML IJ SOLN
INTRAMUSCULAR | Status: AC
  Filled 2012-07-12: qty 1

## 2012-07-12 MED ORDER — ROCURONIUM BROMIDE 100 MG/10ML IV SOLN
INTRAVENOUS | Status: DC | PRN
  Administered 2012-07-12 (×2): 10 mg via INTRAVENOUS
  Administered 2012-07-12: 50 mg via INTRAVENOUS
  Administered 2012-07-12: 20 mg via INTRAVENOUS

## 2012-07-12 MED ORDER — PROMETHAZINE HCL 25 MG/ML IJ SOLN: 6.2500 mg | INTRAMUSCULAR | Status: DC | PRN

## 2012-07-12 MED ORDER — HYDROMORPHONE HCL PF 1 MG/ML IJ SOLN
0.5000 mg | INTRAMUSCULAR | Status: DC | PRN
  Administered 2012-07-12: 0.5 mg via INTRAVENOUS
  Administered 2012-07-12: 1 mg via INTRAVENOUS
  Filled 2012-07-12 (×2): qty 1

## 2012-07-12 MED ORDER — ACETAMINOPHEN 10 MG/ML IV SOLN
INTRAVENOUS | Status: DC | PRN
  Administered 2012-07-12: 1000 mg via INTRAVENOUS

## 2012-07-12 MED ORDER — NEOSTIGMINE METHYLSULFATE 1 MG/ML IJ SOLN
INTRAMUSCULAR | Status: DC | PRN
  Administered 2012-07-12: 4 mg via INTRAVENOUS

## 2012-07-12 MED ORDER — KCL IN DEXTROSE-NACL 10-5-0.45 MEQ/L-%-% IV SOLN
INTRAVENOUS | Status: DC
  Administered 2012-07-12: 75 mL/h via INTRAVENOUS
  Administered 2012-07-13 (×2): via INTRAVENOUS
  Filled 2012-07-12 (×3): qty 1000

## 2012-07-12 MED ORDER — SENNA 8.6 MG PO TABS
1.0000 | ORAL_TABLET | Freq: Two times a day (BID) | ORAL | Status: DC
  Administered 2012-07-12 – 2012-07-13 (×2): 8.6 mg via ORAL
  Filled 2012-07-12 (×2): qty 1

## 2012-07-12 MED ORDER — HYDROMORPHONE HCL PF 1 MG/ML IJ SOLN
INTRAMUSCULAR | Status: DC | PRN
  Administered 2012-07-12 (×3): 0.5 mg via INTRAVENOUS

## 2012-07-12 MED ORDER — HYDROMORPHONE HCL PF 1 MG/ML IJ SOLN
0.2500 mg | INTRAMUSCULAR | Status: DC | PRN
  Administered 2012-07-12: 0.5 mg via INTRAVENOUS

## 2012-07-12 MED ORDER — LACTATED RINGERS IR SOLN
Status: DC | PRN
  Administered 2012-07-12: 1000 mL

## 2012-07-12 MED ORDER — CEFAZOLIN SODIUM-DEXTROSE 2-3 GM-% IV SOLR
2.0000 g | INTRAVENOUS | Status: AC
  Administered 2012-07-12: 2 g via INTRAVENOUS

## 2012-07-12 MED ORDER — DOCUSATE SODIUM 100 MG PO CAPS
100.0000 mg | ORAL_CAPSULE | Freq: Two times a day (BID) | ORAL | Status: DC
  Administered 2012-07-12 – 2012-07-13 (×2): 100 mg via ORAL
  Filled 2012-07-12 (×4): qty 1

## 2012-07-12 MED ORDER — OXYCODONE HCL 5 MG PO TABS
10.0000 mg | ORAL_TABLET | ORAL | Status: DC | PRN
  Administered 2012-07-12 – 2012-07-13 (×4): 10 mg via ORAL
  Filled 2012-07-12 (×5): qty 2

## 2012-07-12 MED ORDER — STERILE WATER FOR IRRIGATION IR SOLN
Status: DC | PRN
  Administered 2012-07-12: 3000 mL

## 2012-07-12 MED ORDER — BUPIVACAINE LIPOSOME 1.3 % IJ SUSP
20.0000 mL | Freq: Once | INTRAMUSCULAR | Status: AC
  Administered 2012-07-12: 20 mL
  Filled 2012-07-12: qty 20

## 2012-07-12 MED ORDER — FENTANYL CITRATE 0.05 MG/ML IJ SOLN
INTRAMUSCULAR | Status: DC | PRN
  Administered 2012-07-12 (×5): 50 ug via INTRAVENOUS

## 2012-07-12 MED ORDER — ONDANSETRON HCL 4 MG/2ML IJ SOLN
4.0000 mg | INTRAMUSCULAR | Status: DC | PRN
  Administered 2012-07-12 – 2012-07-13 (×5): 4 mg via INTRAVENOUS
  Filled 2012-07-12 (×5): qty 2

## 2012-07-12 MED ORDER — CEFAZOLIN SODIUM-DEXTROSE 2-3 GM-% IV SOLR
INTRAVENOUS | Status: AC
  Filled 2012-07-12: qty 50

## 2012-07-12 MED ORDER — LIDOCAINE HCL (CARDIAC) 20 MG/ML IV SOLN
INTRAVENOUS | Status: DC | PRN
  Administered 2012-07-12: 80 mg via INTRAVENOUS

## 2012-07-12 MED ORDER — PANTOPRAZOLE SODIUM 40 MG PO TBEC
40.0000 mg | DELAYED_RELEASE_TABLET | Freq: Every day | ORAL | Status: DC
  Administered 2012-07-13: 40 mg via ORAL
  Filled 2012-07-12: qty 1

## 2012-07-12 MED ORDER — LACTATED RINGERS IV SOLN: INTRAVENOUS | Status: DC

## 2012-07-12 MED ORDER — CYCLOBENZAPRINE HCL 10 MG PO TABS
10.0000 mg | ORAL_TABLET | Freq: Three times a day (TID) | ORAL | Status: DC | PRN
  Filled 2012-07-12: qty 1

## 2012-07-12 MED ORDER — ACETAMINOPHEN 10 MG/ML IV SOLN
1000.0000 mg | Freq: Four times a day (QID) | INTRAVENOUS | Status: AC
  Administered 2012-07-12 – 2012-07-13 (×3): 1000 mg via INTRAVENOUS
  Filled 2012-07-12 (×4): qty 100

## 2012-07-12 SURGICAL SUPPLY — 77 items
ADH SKN CLS APL DERMABOND .7 (GAUZE/BANDAGES/DRESSINGS) ×4
APL ESCP 34 STRL LF DISP (HEMOSTASIS)
APPLICATOR SURGIFLO ENDO (HEMOSTASIS) IMPLANT
BAG SPEC RTRVL LRG 6X4 10 (ENDOMECHANICALS)
CANNULA SEAL DVNC (CANNULA) ×3 IMPLANT
CANNULA SEALS DA VINCI (CANNULA)
CHLORAPREP W/TINT 26ML (MISCELLANEOUS) ×3 IMPLANT
CLIP LIGATING HEM O LOK PURPLE (MISCELLANEOUS) ×2 IMPLANT
CLIP LIGATING HEMO LOK XL GOLD (MISCELLANEOUS) ×10 IMPLANT
CLIP LIGATING HEMO O LOK GREEN (MISCELLANEOUS) ×2 IMPLANT
CLOTH BEACON ORANGE TIMEOUT ST (SAFETY) ×3 IMPLANT
CORDS BIPOLAR (ELECTRODE) ×3 IMPLANT
COVER SURGICAL LIGHT HANDLE (MISCELLANEOUS) ×3 IMPLANT
COVER TIP SHEARS 8 DVNC (MISCELLANEOUS) ×2 IMPLANT
COVER TIP SHEARS 8MM DA VINCI (MISCELLANEOUS) ×1
CUTTER FLEX LINEAR 45M (STAPLE) ×1 IMPLANT
DECANTER SPIKE VIAL GLASS SM (MISCELLANEOUS) ×1 IMPLANT
DERMABOND ADVANCED (GAUZE/BANDAGES/DRESSINGS) ×2
DERMABOND ADVANCED .7 DNX12 (GAUZE/BANDAGES/DRESSINGS) ×4 IMPLANT
DISSECTOR BLUNT TIP ENDO 5MM (MISCELLANEOUS) ×2 IMPLANT
DRAIN CHANNEL 15F RND FF 3/16 (WOUND CARE) ×3 IMPLANT
DRAPE INCISE IOBAN 66X45 STRL (DRAPES) ×3 IMPLANT
DRAPE LAPAROSCOPIC ABDOMINAL (DRAPES) ×3 IMPLANT
DRAPE LG THREE QUARTER DISP (DRAPES) ×4 IMPLANT
DRAPE TABLE BACK 44X90 PK DISP (DRAPES) ×3 IMPLANT
DRAPE WARM FLUID 44X44 (DRAPE) ×3 IMPLANT
ELECT REM PT RETURN 9FT ADLT (ELECTROSURGICAL) ×6
ELECTRODE REM PT RTRN 9FT ADLT (ELECTROSURGICAL) ×4 IMPLANT
EVACUATOR SILICONE 100CC (DRAIN) ×3 IMPLANT
GAUZE VASELINE 3X9 (GAUZE/BANDAGES/DRESSINGS) IMPLANT
GLOVE BIOGEL M STRL SZ7.5 (GLOVE) ×8 IMPLANT
GOWN STRL NON-REIN LRG LVL3 (GOWN DISPOSABLE) ×8 IMPLANT
GOWN STRL REIN XL XLG (GOWN DISPOSABLE) ×7 IMPLANT
KIT ACCESSORY DA VINCI DISP (KITS) ×1
KIT ACCESSORY DVNC DISP (KITS) ×2 IMPLANT
KIT BASIN OR (CUSTOM PROCEDURE TRAY) ×3 IMPLANT
MARKER SKIN DUAL TIP RULER LAB (MISCELLANEOUS) ×1 IMPLANT
NDL INSUFFLATION 14GA 120MM (NEEDLE) ×1 IMPLANT
NEEDLE INSUFFLATION 14GA 120MM (NEEDLE) ×3 IMPLANT
NS IRRIG 1000ML POUR BTL (IV SOLUTION) ×1 IMPLANT
PENCIL BUTTON HOLSTER BLD 10FT (ELECTRODE) ×3 IMPLANT
POSITIONER SURGICAL ARM (MISCELLANEOUS) ×4 IMPLANT
POUCH ENDO CATCH II 15MM (MISCELLANEOUS) ×2 IMPLANT
POUCH SPECIMEN RETRIEVAL 10MM (ENDOMECHANICALS) ×1 IMPLANT
RELOAD 45 VASCULAR/THIN (ENDOMECHANICALS) IMPLANT
RELOAD STAPLE 45 2.5 WHT GRN (ENDOMECHANICALS) ×3 IMPLANT
RELOAD WHITE ECR60W (STAPLE) ×8 IMPLANT
SET TUBE IRRIG SUCTION NO TIP (IRRIGATION / IRRIGATOR) ×2 IMPLANT
SOLUTION ANTI FOG 6CC (MISCELLANEOUS) ×3 IMPLANT
SOLUTION ELECTROLUBE (MISCELLANEOUS) ×3 IMPLANT
SPONGE GAUZE 4X4 12PLY (GAUZE/BANDAGES/DRESSINGS) ×2 IMPLANT
SPONGE LAP 18X18 X RAY DECT (DISPOSABLE) ×3 IMPLANT
SPONGE LAP 4X18 X RAY DECT (DISPOSABLE) ×2 IMPLANT
STAPLE ECHEON FLEX 60 POW ENDO (STAPLE) ×2 IMPLANT
SURGIFLO W/THROMBIN 8M KIT (HEMOSTASIS) IMPLANT
SUT ETHILON 3 0 PS 1 (SUTURE) ×3 IMPLANT
SUT MNCRL AB 4-0 PS2 18 (SUTURE) ×6 IMPLANT
SUT PDS AB 1 CT1 27 (SUTURE) ×6 IMPLANT
SUT PDS AB 1 TP1 54 (SUTURE) ×2 IMPLANT
SUT V-LOC BARB 180 2/0GR6 GS22 (SUTURE)
SUT VIC AB 0 CT1 27 (SUTURE)
SUT VIC AB 0 CT1 27XBRD ANTBC (SUTURE) IMPLANT
SUT VICRYL 0 UR6 27IN ABS (SUTURE) ×4 IMPLANT
SUT VLOC BARB 180 ABS3/0GR12 (SUTURE)
SUTURE V-LC BRB 180 2/0GR6GS22 (SUTURE) ×2 IMPLANT
SUTURE VLOC BRB 180 ABS3/0GR12 (SUTURE) ×1 IMPLANT
SYR BULB IRRIGATION 50ML (SYRINGE) IMPLANT
TAPE CLOTH SURG 4X10 WHT LF (GAUZE/BANDAGES/DRESSINGS) ×2 IMPLANT
TOWEL OR NON WOVEN STRL DISP B (DISPOSABLE) ×6 IMPLANT
TRAY FOLEY CATH 14FRSI W/METER (CATHETERS) ×3 IMPLANT
TRAY LAP CHOLE (CUSTOM PROCEDURE TRAY) ×3 IMPLANT
TROCAR 12M 150ML BLUNT (TROCAR) ×2 IMPLANT
TROCAR BLADELESS OPT 5 75 (ENDOMECHANICALS) ×4 IMPLANT
TROCAR ENDOPATH XCEL 12X100 BL (ENDOMECHANICALS) ×2 IMPLANT
TROCAR XCEL 12X100 BLDLESS (ENDOMECHANICALS) ×3 IMPLANT
TUBING INSUFFLATION 10FT LAP (TUBING) ×3 IMPLANT
WATER STERILE IRR 1500ML POUR (IV SOLUTION) ×2 IMPLANT
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
## 2012-07-12 NOTE — Preoperative (Signed)
Beta Blockers   Reason not to administer Beta Blockers:Not Applicable 

## 2012-07-12 NOTE — Brief Op Note (Signed)
07/12/2012  10:30 AM  PATIENT:  [name]  56 y.o. female  PRE-OPERATIVE DIAGNOSIS:  RIGHT RENAL MASS  POST-OPERATIVE DIAGNOSIS:  RIGHT RENAL MASS  PROCEDURE:  Procedure(s) (LRB) with comments: ROBOTIC ASSISTED LAPAROSCOPIC NEPHRECTOMY (Right)  SURGEON:  Surgeon(s) and Role:    * Sebastian Ache, MD - Primary    * Milford Cage, MD - Assisting  PHYSICIAN ASSISTANT:   ASSISTANTS: Natalia Leatherwood MD   ANESTHESIA:   general  EBL:  Total I/O In: 2000 [I.V.:2000] Out: 225 [Urine:100; Blood:125]  BLOOD ADMINISTERED:none  DRAINS: (1) Jackson-Pratt drain(s) with closed bulb suction in the RLQ   LOCAL MEDICATIONS USED:  MARCAINE     SPECIMEN:  Source of Specimen:  Rt Kidney  DISPOSITION OF SPECIMEN:  PATHOLOGY  COUNTS:  YES  TOURNIQUET:  * No tourniquets in log *  DICTATION: .Other Dictation: Dictation Number  [id number]  PLAN OF CARE: Admit to inpatient   PATIENT DISPOSITION:  PACU - hemodynamically stable.   Delay start of Pharmacological VTE agent (>24hrs) due to surgical blood loss or risk of bleeding: yes

## 2012-07-12 NOTE — Anesthesia Postprocedure Evaluation (Signed)
Anesthesia Post Note  Patient: [name]  Procedure(s) Performed: Procedure(s) (LRB): ROBOTIC ASSISTED LAPAROSCOPIC NEPHRECTOMY (Right)  Anesthesia type: General  Patient location: PACU  Post pain: Pain level controlled  Post assessment: Post-op Vital signs reviewed  Last Vitals:  Filed Vitals:   07/12/12 1130  BP: 153/73  Pulse: 83  Temp:   Resp: 15    Post vital signs: Reviewed  Level of consciousness: sedated  Complications: No apparent anesthesia complications

## 2012-07-12 NOTE — Progress Notes (Signed)
Hgb. And Hct. Drawn by Lab 

## 2012-07-12 NOTE — Transfer of Care (Signed)
Immediate Anesthesia Transfer of Care Note  Patient: [name]  Procedure(s) Performed: Procedure(s) (LRB) with comments: ROBOTIC ASSISTED LAPAROSCOPIC NEPHRECTOMY (Right)  Patient Location: PACU  Anesthesia Type:General  Level of Consciousness: awake, alert , oriented and patient cooperative  Airway & Oxygen Therapy: Patient Spontanous Breathing and Patient connected to face mask oxygen  Post-op Assessment: Report given to PACU RN, Post -op Vital signs reviewed and stable and Patient moving all extremities  Post vital signs: Reviewed and stable  Complications: No apparent anesthesia complications

## 2012-07-12 NOTE — Progress Notes (Signed)
Hgb. 12.0- Hct. 34.6- Results noted.

## 2012-07-12 NOTE — Anesthesia Preprocedure Evaluation (Addendum)
Anesthesia Evaluation  Patient identified by MRN, date of birth, ID band Patient awake    Reviewed: Allergy & Precautions, H&P , NPO status , Patient's Chart, lab work & pertinent test results  Airway Mallampati: II TM Distance: >3 FB Neck ROM: Full    Dental  (+) Edentulous Upper, Poor Dentition and Dental Advisory Given   Pulmonary neg pulmonary ROS,  breath sounds clear to auscultation  Pulmonary exam normal       Cardiovascular hypertension, Pt. on medications + Valvular Problems/Murmurs Rhythm:Regular Rate:Normal     Neuro/Psych negative neurological ROS  negative psych ROS   GI/Hepatic Neg liver ROS, GERD-  Medicated,  Endo/Other  negative endocrine ROS  Renal/GU negative Renal ROS  negative genitourinary   Musculoskeletal negative musculoskeletal ROS (+)   Abdominal   Peds  Hematology negative hematology ROS (+)   Anesthesia Other Findings   Reproductive/Obstetrics negative OB ROS                          Anesthesia Physical Anesthesia Plan  ASA: III  Anesthesia Plan: General   Post-op Pain Management:    Induction: Intravenous  Airway Management Planned: Oral ETT  Additional Equipment:   Intra-op Plan:   Post-operative Plan: Extubation in OR  Informed Consent: I have reviewed the patients History and Physical, chart, labs and discussed the procedure including the risks, benefits and alternatives for the proposed anesthesia with the patient or authorized representative who has indicated his/her understanding and acceptance.   Dental advisory given  Plan Discussed with: CRNA  Anesthesia Plan Comments:         Anesthesia Quick Evaluation

## 2012-07-12 NOTE — Op Note (Signed)
[name], [name] NO.:  [id number]  MEDICAL RECORD NO.:  [id number]  LOCATION:  1416                         FACILITY:  [facility]  PHYSICIAN:  Sebastian Ache, MD     DATE OF BIRTH:  March 07, 1956  DATE OF PROCEDURE:  07/12/2012 DATE OF DISCHARGE:                              OPERATIVE REPORT   DIAGNOSIS:  Large right renal mass.  PROCEDURE:  Robotic-assisted laparoscopic right radical nephrectomy.  SPECIMEN:  Right kidney with perihilar lymph nodes.  ESTIMATED BLOOD LOSS:  125 mL.  ASSISTANT:  Natalia Leatherwood, MD  DRAIN:  Jackson-Pratt drain to bulb suction.  COMPLICATIONS:  None.  INDICATION:  Ms. [name] is a pleasant 56 year old lady with history of degenerative joint disease and chronic back pain.  She underwent MRI of the spine, which revealed an incidental large right renal mass.  A dedicated CT scan was performed, which revealed a large 7-cm infiltrative right renal mass worrisome for renal cell carcinoma.  There were associated parasitic vessels and straining worrisome for possible T3 disease; however, no local regional disease or adenopathy was noted. Chest x-ray was normal.  Options were discussed with the patient including surgical extirpation with and without minimally invasive assistance and wished to proceed with the latter.  Informed consent was obtained and placed in the medical record.  PROCEDURE IN DETAIL:  The patient being [name], was verified. Procedure being right robotic radical nephrectomy was confirmed. Procedure was carried out.  Time-out was performed.  Intravenous antibiotics were administered.  General endotracheal anesthesia was introduced.  The patient was placed into a right side up with full flank position with 15 degrees of table flexion, beanbag, superior arm elevator, axillary roll, sequential compression devices, bottom of the leg bent and up of the leg straight.  She was further fashioned in the operative table  using 3-inch silk tape.  Sterile field was created by prepping and draping the patient's entire right flank and abdomen using chlorhexidine gluconate.  Next, a high-flow low-pressure pneumoperitoneum was easily obtained using Veress technique and the right lower quadrant having passed the aspiration and drop test.  A 12- mm robotic camera port was then placed 3 fingerbreadths superior lateral to the umbilicus on the right side.  Laparoscopic examination of the peritoneal cavity revealed no significant adhesions or visceral injury. The liver was indeed quite large as anticipated.  As such, a 5-mm port was placed in the subxiphoid area and the  self-locking grasper was used to help place the liver on very gentle lateral traction.  Next, additional ports were placed as follows:  8-mm right subcostal robotic port, 8-mm inferior paramedian robotic port, far lateral 8-mm robotic port 3 fingerbreadths superior medial to the anterior suprailiac spine; 12-mm assistant port in the midline 4 fingerbreadths below the level of the camera port and a 5-mm assistant port in the midline 4 fingerbreadths above the level of the camera port.  Robot was docked and passed through electronic checks.  Initial attention was directed to the level of the retroperitoneum.  Incision was made lateral to the ascending colon from the area of the cecum and towards the area of the hepatic flexure.  This was  flipped medially.  The lower pole of the kidney was placed on very gentle lateral traction.  Dissection was proceeded inferomedially towards the area of the psoas muscle.  The ureter was encountered, doubly clipped and ligated.  The conglomeration of gonadal and parasitic vessels was encounter.  These were controlled with vascular load stapler.  Dissection then proceeded in this triangle superiorly towards the area of the renal hilum.  The hilum was encounter, this consisted of two-vein, one artery with also  separate conglomeration of the lower pole of parasitic vessels.  The parasitic vessels were controlled using large Weck clips x2 down and 1 up and the lower pole smaller vein was controlled using vascular load stapler. This provided excellent visualization of the single right renal artery. This was controlled using an extra large Weck clips, 3 down and 1 up and cold scissor ligation.  The remaining renal vein was controlled using vascular load stapler and the adrenal gland was seen and was grossly normal.  As such, adrenal-sparing technique was used by using vascular stapler, separating the upper polefrom the overlying adrenal gland.  The superior attachments were very carefully taken down and the superior peritoneum was incised.  Several areas of parasitic vessels were controlled with extra large Weck clips.  Lateral attachments were taken down.  This completely freed up the right radical nephrectomy. Specimens were placed into a large EndoCatch bag for later retrieval. Inspection of the hilum revealed excellent hemostasis.  All sponge and needle counts were correct.  Dissection of liver bed was unremarkable. Liver was then taken off of lateral traction.  The specimen was flipped superiorly and then a closed suction drain was brought through the right lateralmost assistant port, directed to the right retroperitoneum. Robot was then undocked.  Specimen was retrieved by extending the previous lower assistant port site in the midline for total distance of approximately 5 cm.  The specimen was retrieved and set aside for permanent pathology.  The previous 12-mm camera port site was closed with fascia using figure-of-eight Vicryl.  The extraction site was closed with little fascia using interrupted figure-of-eight PDS followed by interrupted Vicryl to the level of the Scarpa's fascia.  All incision sites were then infiltrated with 40 mL of half and half Exparel and Marcaine.  The skin was  reapproximated using subcuticular Monocryl. Procedure was then terminated.  The patient tolerated the procedure well.  There were no immediate postprocedure complications.  The patient was taken to the postanesthesia care unit in excellent condition.          ______________________________ Sebastian Ache, MD     TM/MEDQ  D:  07/12/2012  T:  07/12/2012  Job:  [id number]

## 2012-07-13 ENCOUNTER — Encounter (HOSPITAL_COMMUNITY): Payer: Self-pay | Admitting: Urology

## 2012-07-13 LAB — BASIC METABOLIC PANEL
BUN: 10 mg/dL (ref 6–23)
CO2: 28 mEq/L (ref 19–32)
Calcium: 8.9 mg/dL (ref 8.4–10.5)
GFR calc non Af Amer: 51 mL/min — ABNORMAL LOW (ref >90)
Glucose, Bld: 107 mg/dL — ABNORMAL HIGH (ref 70–99)
Potassium: 3.8 mEq/L (ref 3.5–5.1)

## 2012-07-13 MED ORDER — LORATADINE 10 MG PO TABS
10.0000 mg | ORAL_TABLET | Freq: Every day | ORAL | Status: DC
  Administered 2012-07-13: 10 mg via ORAL
  Filled 2012-07-13: qty 1

## 2012-07-13 MED ORDER — OXYCODONE-ACETAMINOPHEN 5-325 MG PO TABS: 1.0000 | ORAL_TABLET | Freq: Four times a day (QID) | ORAL | Status: DC | PRN

## 2012-07-13 MED ORDER — SENNA-DOCUSATE SODIUM 8.6-50 MG PO TABS: 1.0000 | ORAL_TABLET | Freq: Two times a day (BID) | ORAL | Status: AC

## 2012-07-13 NOTE — Progress Notes (Signed)
Patient discharged in stable condition. Patient's husband took her home via personal car. Erskin Burnet RN

## 2012-07-13 NOTE — Progress Notes (Signed)
1 Day Post-Op  Subjective:  1 - Rt Renal Neoplasm - POD1 s/p right robotic radical nephrectomy. Hgb and Cr acceptable. No acute events. Tolerating diet and pain controlled. Path pending.   Objective: Vital signs in last 24 hours: Temp:  [97.4 F (36.3 C)-98.6 F (37 C)] 98.6 F (37 C) (12/31 0546) Pulse Rate:  [75-98] 98  (12/31 0546) Resp:  [12-18] 18  (12/31 0546) BP: (121-177)/(60-81) 131/70 mmHg (12/31 0546) SpO2:  [93 %-100 %] 95 % (12/31 0546) Weight:  [82.9 kg (182 lb 12.2 oz)] 82.9 kg (182 lb 12.2 oz) (12/30 1226)    Intake/Output from previous day: 12/30 0701 - 12/31 0700 In: 4343.8 [P.O.:300; I.V.:4043.8] Out: 27 [Urine:3825; Emesis/NG output:100; Drains:35; Blood:125] Intake/Output this shift: Total I/O In: 975 [I.V.:975] Out: 2605 [Urine:2600; Drains:5]  General appearance: alert, cooperative and appears stated age Head: Normocephalic, without obvious abnormality, atraumatic Eyes: conjunctivae/corneas clear. PERRL, EOM's intact. Fundi benign. Ears: normal TM's and external ear canals both ears Nose: Nares normal. Septum midline. Mucosa normal. No drainage or sinus tenderness. Throat: lips, mucosa, and tongue normal; teeth and gums normal Back: symmetric, no curvature. ROM normal. No CVA tenderness. Resp: clear to auscultation bilaterally Cardio: regular rate and rhythm, S1, S2 normal, no murmur, click, rub or gallop GI: soft, non-tender; bowel sounds normal; no masses,  no organomegaly Extremities: extremities normal, atraumatic, no cyanosis or edema Pulses: 2+ and symmetric Skin: Skin color, texture, turgor normal. No rashes or lesions Lymph nodes: Cervical, supraclavicular, and axillary nodes normal. Neurologic: Grossly normal Incision/Wound: Port sites and extraction site c/d/i. JP with scant serous output. Removed and dry dressing applied.  Lab Results:   Basename 07/13/12 0500 07/12/12 1059  WBC -- --  HGB 12.3 12.0  HCT 36.8 34.6*  PLT -- --    BMET  Basename 07/13/12 0500  NA 137  K 3.8  CL 102  CO2 28  GLUCOSE 107*  BUN 10  CREATININE 1.18*  CALCIUM 8.9   PT/INR No results found for this basename: LABPROT:2,INR:2 in the last 72 hours ABG No results found for this basename: PHART:2,PCO2:2,PO2:2,HCO3:2 in the last 72 hours  Studies/Results: No results found.  Anti-infectives: Anti-infectives     Start     Dose/Rate Route Frequency Ordered Stop   07/12/12 0519   ceFAZolin (ANCEF) IVPB 2 g/50 mL premix        2 g 100 mL/hr over 30 Minutes Intravenous 30 min pre-op 07/12/12 0519 07/12/12 0732          Assessment/Plan: 1 - Rt Renal Neoplasm - Doing well POD 1. - DC foley - Ambulate - Advance Diet - Possible DC in afternoon v. tomorrow  [facility], Hue Frick 07/13/2012

## 2012-07-13 NOTE — Discharge Summary (Signed)
Physician Discharge Summary  Patient ID: [name] MRN: [id number] DOB/AGE: 03-17-1956 56 y.o.  Admit date: 07/12/2012 Discharge date: 07/13/2012  Admission Diagnoses: Rt Renal Neoplasm  Discharge Diagnoses: pT1b Rt Renal Cell Carcinoma  Discharged Condition: good  Hospital Course:   Pt underwent Rt robotic radical nephrectomy on the day of admission without acute complicaitons. She was admitted to the 4th floor Urology service post-op where she began her vigorous recovery. By POD 1, the day of discharge, she was ambulatory, tollerating diet, pain controlled on PO meds and felt to be adequate for discharge. Hgb >10 and Cr <1.5. JP removed. 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   Consults: None  Significant Diagnostic Studies: labs: Hgb >10 and Cr <1.5.  Treatments: surgery:  Rt robotic radical nephrectomy  Discharge Exam: Blood pressure 123/70, pulse 107, temperature 99.3 F (37.4 C), temperature source Oral, resp. rate 16, height 5\' 2"  (1.575 m), weight 82.9 kg (182 lb 12.2 oz), SpO2 91.00%. General appearance: alert, cooperative, appears stated age and family and pastor in room Head: Normocephalic, without obvious abnormality, atraumatic Eyes: conjunctivae/corneas clear. PERRL, EOM's intact. Fundi benign. Ears: normal TM's and external ear canals both ears Nose: Nares normal. Septum midline. Mucosa normal. No drainage or sinus tenderness. Throat: lips, mucosa, and tongue normal; teeth and gums normal Neck: no adenopathy, no carotid bruit, no JVD, supple, symmetrical, trachea midline and thyroid not enlarged, symmetric, no tenderness/mass/nodules Back: symmetric, no curvature. ROM normal. No CVA tenderness. Resp: clear to auscultation bilaterally Cardio: regular rate and rhythm, S1, S2 normal, no murmur, click, rub or gallop GI: soft, non-tender; bowel sounds normal; no masses,  no  organomegaly Extremities: extremities normal, atraumatic, no cyanosis or edema Pulses: 2+ and symmetric Skin: Skin color, texture, turgor normal. No rashes or lesions Lymph nodes: Cervical, supraclavicular, and axillary nodes normal. Neurologic: Grossly normal Incision/Wound: Port  sites, prior JP site, and extraction sites all c/d/i. No erythema or hernia.  Disposition: Home  Discharge Orders    Future Appointments: Provider: Department: Dept Phone: Center:   08/04/2012 8:00 AM Jeoffrey Massed, MD [facility] [place] [phone] None       Medication List     As of 07/13/2012  4:52 PM    STOP taking these medications         meloxicam 7.5 MG tablet   Commonly known as: MOBIC      oxyCODONE 5 MG immediate release tablet   Commonly known as: Oxy IR/ROXICODONE      TAKE these medications         cyclobenzaprine 10 MG tablet   Commonly known as: FLEXERIL   Take 10 mg by mouth 2 (two) times daily as needed. Muscle spasms      gabapentin 300 MG capsule   Commonly known as: NEURONTIN   Take 300 mg by mouth 3 (three) times daily.      losartan 50 MG tablet   Commonly known as: COZAAR   Take 50-100 mg by mouth as needed.      multivitamin with minerals Tabs   Take 1 tablet by mouth daily.      omeprazole 20 MG capsule   Commonly known as: PRILOSEC   Take 1 capsule by mouth Daily.      OVER THE COUNTER MEDICATION   Take 2 tablets by mouth daily. RELACORE- natural weight loss supplement      oxyCODONE-acetaminophen 5-325 MG per  tablet   Commonly known as: PERCOCET/ROXICET   Take 1-2 tablets by mouth every 6 (six) hours as needed for pain.      sennosides-docusate sodium 8.6-50 MG tablet   Commonly known as: SENOKOT-S   Take 1 tablet by mouth 2 (two) times daily. While taking pain meds to prevent constipation           Follow-up Information    Follow up with Sebastian Ache, MD. On 07/19/2012. (at 12:45 for MD visit)    Contact information:   [address] [place] [address] [phone]          Signed: Sebastian Ache 07/13/2012, 4:52 PM

## 2012-08-04 ENCOUNTER — Ambulatory Visit (INDEPENDENT_AMBULATORY_CARE_PROVIDER_SITE_OTHER): Payer: BC Managed Care – PPO | Admitting: Family Medicine

## 2012-08-04 ENCOUNTER — Encounter: Payer: Self-pay | Admitting: Family Medicine

## 2012-08-04 VITALS — BP 135/89 | HR 73 | Ht 62.25 in | Wt 176.0 lb

## 2012-08-04 DIAGNOSIS — I10 Essential (primary) hypertension: Secondary | ICD-10-CM

## 2012-08-04 DIAGNOSIS — Z905 Acquired absence of kidney: Secondary | ICD-10-CM

## 2012-08-04 LAB — BASIC METABOLIC PANEL
Calcium: 9.8 mg/dL (ref 8.4–10.5)
GFR: 59.21 mL/min — ABNORMAL LOW (ref >60.00)
Potassium: 4.1 mEq/L (ref 3.5–5.1)
Sodium: 137 mEq/L (ref 135–145)

## 2012-08-04 NOTE — Progress Notes (Signed)
OFFICE NOTE  08/04/2012  CC:  Chief Complaint  Patient presents with  . Follow-up    renal cancer, myofascial pain     HPI: Patient is a 57 y.o. Caucasian female who is here for f/u back pain and nephrectomy for renal malignancy (hosp 12/30-12/31, 2013). She is doing well, seems like her low back pain and legs/feet pains have all resolved.  She still does have some mid back pain.  Neurontin had been helping with some of her pain and she wants to remain on this for now.  Is otherwise only on some narcotic pain meds for post-surgical pains (from her surgeon).   Remarks how well she is feeling.  Is walking 2 miles most days now, wants to return to work 08/14/12.  She is taking some meloxicam and we discussed today the fact that she'll have to be extra careful with certain meds (like NSAIDs) since she now has only one kidney.   Fortunately, it seems her renal malignancy was locally contained and she may not need any further treatment besides her surgery.  Pertinent PMH:  Past Medical History  Diagnosis Date  . Overweight   . History of bronchitis     childhood, wood stove and coal heat  . History of galactorrhea 02/05/2011  . Knee pain, bilateral 02/05/2011  . Allergy   . GERD (gastroesophageal reflux disease)   . Heart murmur   . Preventative health care 02/05/2011  . Elevated BP 02/05/2011  . Recurrent acute tonsillitis 03/05/2011  . Bronchitis, acute 02/05/2012    no current problems 07-05-12  . Hyperlipidemia, mixed   . Renal mass, right 05/25/12    Noted on T-spine MRI that was done for ongoing mid/upper back pain by physical med/rehab MD.  Urology re-imaged with a CT scan and confirmed a mass suspicious for malignant tumor so they recommended nephrectomy.  Their impression was that her dz was localized.  . Hypertension   . Arthritis     back  . Hemorrhoids    Past Surgical History  Procedure Date  . Cesarean section 1983  . Tubal ligation   . Tonsillectomy   . Robot assisted  laparoscopic nephrectomy 07/12/2012    Procedure: ROBOTIC ASSISTED LAPAROSCOPIC NEPHRECTOMY;  Surgeon: Sebastian Ache, MD;  Location: [facility];  Service: Urology;  Laterality: Right;    MEDS:  Outpatient Prescriptions Prior to Visit  Medication Sig Dispense Refill  . gabapentin (NEURONTIN) 300 MG capsule Take 300 mg by mouth 3 (three) times daily.      Marland Kitchen losartan (COZAAR) 50 MG tablet Take 50-100 mg by mouth as needed.      . Multiple Vitamin (MULTIVITAMIN WITH MINERALS) TABS Take 1 tablet by mouth daily.      Marland Kitchen omeprazole (PRILOSEC) 20 MG capsule Take 1 capsule by mouth Daily.      Marland Kitchen OVER THE COUNTER MEDICATION Take 2 tablets by mouth daily. RELACORE- natural weight loss supplement      . sennosides-docusate sodium (SENOKOT-S) 8.6-50 MG tablet Take 1 tablet by mouth 2 (two) times daily. While taking pain meds to prevent constipation  30 tablet  0  . cyclobenzaprine (FLEXERIL) 10 MG tablet Take 10 mg by mouth 2 (two) times daily as needed. Muscle spasms      . oxyCODONE-acetaminophen (ROXICET) 5-325 MG per tablet Take 1-2 tablets by mouth every 6 (six) hours as needed for pain.  40 tablet  0   Last reviewed on 08/04/2012  8:53 AM by Jeoffrey Massed, MD  PE: Blood pressure 135/89, pulse 73, height 5' 2.25" (1.581 m), weight 176 lb (79.833 kg). Gen: Alert, well appearing.  Patient is oriented to person, place, time, and situation. AFFECT: pleasant, lucid thought and speech. CV: RRR, no m/r/g.   LUNGS: CTA bilat, nonlabored resps, good aeration in all lung fields. ABD: soft, minimal discomfort with diffuse palpation without guarding or rebound.  Well healed surgical wounds. EXT: no clubbing, cyanosis, or edema.   IMPRESSION AND PLAN:  1) Back pain (mid): imaging showed no significant pathology to correlate with this pain.  Continue neurontin for now. We are going to hold off on the pain management referral we had ordered--she has not made appt for this yet--in hopes that her pain continues  to subside and be controlled with tylenol prn only in near future.  2) Renal cancer, s/p right nephrectomy.  Doing very well. Discussed need to do all we can to avoid renal insult and to preserve kidney function as much as possible. In that vain, we d/c'd meloxicam today.    3) HTN.  Stable.  Continue cozaar 50mg  qd at this time.  BMET today.  FOLLOW UP: 54mo

## 2012-08-09 ENCOUNTER — Other Ambulatory Visit: Payer: Self-pay | Admitting: *Deleted

## 2012-08-09 MED ORDER — LOSARTAN POTASSIUM 50 MG PO TABS: 50.0000 mg | ORAL_TABLET | ORAL | Status: DC | PRN

## 2012-08-09 NOTE — Telephone Encounter (Signed)
Faxed refill request received from pharmacy for LOSARTAN Last filled by MD on 12/04/11, #30 X 5 Last seen on 08/04/12 Follow up 09/09/12 RX SENT.

## 2012-08-18 ENCOUNTER — Telehealth: Payer: Self-pay | Admitting: Family Medicine

## 2012-08-18 MED ORDER — DULOXETINE HCL 30 MG PO CPEP: 30.0000 mg | ORAL_CAPSULE | Freq: Every day | ORAL | Status: DC

## 2012-08-18 NOTE — Telephone Encounter (Signed)
Cymbalta 30mg , 1 tab daily, #30, no RF--sent to her pharmacy.  Needs f/u in office in 3-4 wks. -thx

## 2012-08-18 NOTE — Telephone Encounter (Signed)
Please advise 

## 2012-08-18 NOTE — Telephone Encounter (Signed)
Patient has returned back to work, is doing okay in the AM but is in a lot of pain in the afternoon. Is taking 1 tylenol 625 MG in AM & 1 in PM. Patient said that you mentioned taking Cymbalta at last OV. She is interested in trying the Cymbalta. Can an Rx be [facility] [place]. Please advise.

## 2012-08-19 NOTE — Telephone Encounter (Signed)
Pt notified.  She will call back to schedule.

## 2012-08-19 NOTE — Telephone Encounter (Signed)
I have attempted to contact this patient by phone with the following results: left message to return my call on answering machine (home/mobile).  

## 2012-09-15 ENCOUNTER — Encounter: Payer: Self-pay | Admitting: Family Medicine

## 2012-09-15 ENCOUNTER — Ambulatory Visit (INDEPENDENT_AMBULATORY_CARE_PROVIDER_SITE_OTHER): Payer: BC Managed Care – PPO | Admitting: Family Medicine

## 2012-09-15 VITALS — BP 130/76 | HR 76 | Ht 62.25 in | Wt 171.0 lb

## 2012-09-15 DIAGNOSIS — M797 Fibromyalgia: Secondary | ICD-10-CM | POA: Insufficient documentation

## 2012-09-15 DIAGNOSIS — IMO0001 Reserved for inherently not codable concepts without codable children: Secondary | ICD-10-CM

## 2012-09-15 MED ORDER — CYCLOBENZAPRINE HCL 10 MG PO TABS: 10.0000 mg | ORAL_TABLET | Freq: Two times a day (BID) | ORAL | Status: DC | PRN

## 2012-09-15 MED ORDER — DULOXETINE HCL 60 MG PO CPEP: 60.0000 mg | ORAL_CAPSULE | Freq: Every day | ORAL | Status: DC

## 2012-09-15 NOTE — Addendum Note (Signed)
Addended by: Jeoffrey Massed on: 09/15/2012 09:16 AM   Modules accepted: Orders

## 2012-09-15 NOTE — Progress Notes (Signed)
OFFICE NOTE  09/15/2012  CC:  Chief Complaint  Patient presents with  . Follow-up    cymbalta-doing well     HPI: Patient is a 57 y.o. Caucasian female who is here for 1 mo f/u cymbalta trial.   Positives: myalgias at the end of her day are much improved.  Tension in body is less.  Calmer+.  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 Negatives: no side effects. Still feels like it could help more, esp with myalgias/arthralgias in lower lumbar spine/SI joints area. She is no longer on any narcotic pain meds.    Pertinent PMH:  Past Medical History  Diagnosis Date  . Overweight   . History of bronchitis     childhood, wood stove and coal heat  . History of galactorrhea 02/05/2011  . Knee pain, bilateral 02/05/2011  . Allergy   . GERD (gastroesophageal reflux disease)   . Heart murmur   . Preventative health care 02/05/2011  . Elevated BP 02/05/2011  . Recurrent acute tonsillitis 03/05/2011  . Bronchitis, acute 02/05/2012    no current problems 07-05-12  . Hyperlipidemia, mixed   . Renal mass, right 05/25/12    Noted on T-spine MRI that was done for ongoing mid/upper back pain by physical med/rehab MD.  Urology re-imaged with a CT scan and confirmed a mass suspicious for malignant tumor so they recommended nephrectomy.  Their impression was that her dz was localized.  . Hypertension   . Arthritis     back  . Hemorrhoids    Past surgical, social, and family history reviewed and no changes noted since last office visit.  MEDS:  Outpatient Prescriptions Prior to Visit  Medication Sig Dispense Refill  . cyclobenzaprine (FLEXERIL) 10 MG tablet Take 10 mg by mouth 2 (two) times daily as needed. Muscle spasms      . DULoxetine (CYMBALTA) 30 MG capsule Take 1 capsule (30 mg total) by mouth daily.  30 capsule  0  . gabapentin (NEURONTIN) 300 MG capsule Take 300 mg by mouth 3 (three) times daily.      Marland Kitchen losartan (COZAAR) 50 MG tablet Take 1-2  tablets (50-100 mg total) by mouth as needed.  60 tablet  5  . Multiple Vitamin (MULTIVITAMIN WITH MINERALS) TABS Take 1 tablet by mouth daily.      Marland Kitchen omeprazole (PRILOSEC) 20 MG capsule Take 1 capsule by mouth Daily.      Marland Kitchen OVER THE COUNTER MEDICATION Take 2 tablets by mouth daily. RELACORE- natural weight loss supplement      . oxyCODONE-acetaminophen (ROXICET) 5-325 MG per tablet Take 1-2 tablets by mouth every 6 (six) hours as needed for pain.  40 tablet  0  . sennosides-docusate sodium (SENOKOT-S) 8.6-50 MG tablet Take 1 tablet by mouth 2 (two) times daily. While taking pain meds to prevent constipation  30 tablet  0  . HYDROcodone-acetaminophen (NORCO/VICODIN) 5-325 MG per tablet as directed.      . meloxicam (MOBIC) 7.5 MG tablet Take 1 tablet by mouth Daily.      Marland Kitchen oxyCODONE (OXY IR/ROXICODONE) 5 MG immediate release tablet as needed.       No facility-administered medications prior to visit.    PE: Blood pressure 130/76, pulse 76, height 5' 2.25" (1.581 m), weight 171 lb (77.565 kg). Gen: Alert, well appearing.  Patient is oriented to person, place, time, and situation. AFFECT: pleasant, lucid thought and speech. CV: RRR, no m/r/g.  LUNGS: CTA bilat, nonlabored resps, good aeration in all lung fields. EXT: no clubbing, cyanosis, or edema.   IMPRESSION AND PLAN:  Fibromyalgia syndrome Improved with 30mg  cymbalta qd x 1 mo, but room for more improvement and pt tolerating med well. Increase to 60mg  cymbalta qd.   RF flexeril.  May take celebrex 200mg  qd prn but avoid this med if sick and we'll watch renal function closely if she is taking this regularly. SI joint stretches discussed.     FOLLOW UP:  6-8 wks

## 2012-09-15 NOTE — Assessment & Plan Note (Addendum)
Improved with 30mg  cymbalta qd x 1 mo, but room for more improvement and pt tolerating med well. Increase to 60mg  cymbalta qd.   RF flexeril.  May take celebrex 200mg  qd prn but avoid this med if sick and we'll watch renal function closely if she is taking this regularly. SI joint stretches discussed.

## 2012-09-29 ENCOUNTER — Ambulatory Visit: Payer: BC Managed Care – PPO | Admitting: Family Medicine

## 2012-10-06 ENCOUNTER — Telehealth: Payer: Self-pay | Admitting: Family Medicine

## 2012-10-06 MED ORDER — CELECOXIB 200 MG PO CAPS: ORAL_CAPSULE | ORAL | Status: DC

## 2012-10-06 NOTE — Telephone Encounter (Signed)
[name] on 10/06/2012 10:58 AM Medication Request Patient is doing good on the samples of celebrex and [facility] [place]

## 2012-10-06 NOTE — Telephone Encounter (Signed)
OK.  Rx sent to pharmacy. 

## 2012-10-07 NOTE — Telephone Encounter (Signed)
Patient informed/SLS  

## 2012-11-03 ENCOUNTER — Ambulatory Visit (INDEPENDENT_AMBULATORY_CARE_PROVIDER_SITE_OTHER): Payer: BC Managed Care – PPO | Admitting: Family Medicine

## 2012-11-03 ENCOUNTER — Encounter: Payer: Self-pay | Admitting: Family Medicine

## 2012-11-03 VITALS — BP 134/85 | HR 87 | Temp 97.7°F | Resp 16 | Wt 174.5 lb

## 2012-11-03 DIAGNOSIS — Z905 Acquired absence of kidney: Secondary | ICD-10-CM

## 2012-11-03 DIAGNOSIS — E782 Mixed hyperlipidemia: Secondary | ICD-10-CM

## 2012-11-03 LAB — BASIC METABOLIC PANEL
BUN: 26 mg/dL — ABNORMAL HIGH (ref 6–23)
Creatinine, Ser: 1.2 mg/dL (ref 0.4–1.2)
GFR: 47.76 mL/min — ABNORMAL LOW (ref >60.00)
Glucose, Bld: 87 mg/dL (ref 70–99)
Potassium: 4.6 mEq/L (ref 3.5–5.1)

## 2012-11-03 LAB — LIPID PANEL
Cholesterol: 296 mg/dL — ABNORMAL HIGH (ref 0–200)
HDL: 63.8 mg/dL (ref >39.00)
VLDL: 29.2 mg/dL (ref 0.0–40.0)

## 2012-11-03 MED ORDER — MELOXICAM 15 MG PO TABS: ORAL_TABLET | ORAL | Status: DC

## 2012-11-03 NOTE — Progress Notes (Signed)
OFFICE NOTE  11/08/2012  CC:  Chief Complaint  Patient presents with  . Follow-up    Medication Management; Cholesterol     HPI: Patient is a 57 y.o. Caucasian female who is here for f/u myalgias, SI joint dysfunction. Continues to improve regarding low back pain, has not required narcotic pain med in > 1 mo per her report. Says celebrex to costly.  Asks for trial of mobic or other generic.  Also, has been a bit oversedated on neurontin and cymbalta together but says she has been taking the cymbalta in the morning. She will soon start walking and light workouts per her report. Is fasting today in prep for repeat chol panel: last one was abnl but she wanted to try TLC.  Pertinent PMH:  Past Medical History  Diagnosis Date  . Overweight   . History of bronchitis     childhood, wood stove and coal heat  . History of galactorrhea 02/05/2011  . Knee pain, bilateral 02/05/2011  . Allergy   . GERD (gastroesophageal reflux disease)   . Heart murmur   . Preventative health care 02/05/2011  . Elevated BP 02/05/2011  . Recurrent acute tonsillitis 03/05/2011  . Bronchitis, acute 02/05/2012    no current problems 07-05-12  . Hyperlipidemia, mixed   . 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 Hypertension   . Arthritis     back  . Hemorrhoids     MEDS:  Outpatient Prescriptions Prior to Visit  Medication Sig Dispense Refill  . cyclobenzaprine (FLEXERIL) 10 MG tablet Take 1 tablet (10 mg total) by mouth 2 (two) times daily as needed. Muscle spasms  60 tablet  3  . diphenhydramine-acetaminophen (TYLENOL PM) 25-500 MG TABS Take 1 tablet by mouth at bedtime as needed.      . DULoxetine (CYMBALTA) 60 MG capsule Take 1 capsule (60 mg total) by mouth daily.  30 capsule  2  . gabapentin (NEURONTIN) 300 MG capsule Take 300 mg by mouth 3 (three) times daily.      Marland Kitchen losartan (COZAAR) 50 MG tablet Take 1-2 tablets (50-100 mg total) by mouth as  needed.  60 tablet  5  . Multiple Vitamin (MULTIVITAMIN WITH MINERALS) TABS Take 1 tablet by mouth daily.      Marland Kitchen omeprazole (PRILOSEC) 20 MG capsule Take 1 capsule by mouth Daily.      Marland Kitchen OVER THE COUNTER MEDICATION Take 2 tablets by mouth daily. RELACORE- natural weight loss supplement      . sennosides-docusate sodium (SENOKOT-S) 8.6-50 MG tablet Take 1 tablet by mouth 2 (two) times daily. While taking pain meds to prevent constipation  30 tablet  0  . celecoxib (CELEBREX) 200 MG capsule 1 tab po qd prn musculoskeletal pain  30 capsule  6  . oxyCODONE-acetaminophen (ROXICET) 5-325 MG per tablet Take 1-2 tablets by mouth every 6 (six) hours as needed for pain.  40 tablet  0   No facility-administered medications prior to visit.    PE: Blood pressure 134/85, pulse 87, temperature 97.7 F (36.5 C), temperature source Oral, resp. rate 16, weight 174 lb 8 oz (79.153 kg), SpO2 98.00%. Gen: Alert, well appearing.  Patient is oriented to person, place, time, and situation. No further exam today.  IMPRESSION AND PLAN: 1) Subacute LB musculoskeletal strain/pain (Lumbar and SI joints): stable, gradually improving.  Back at work now. D/C celebrex, start mobic 15mg  qd prn.  Will watch renal function while on fairly  regular NSAIDs given hx of nephrectomy.  2) fibromyalgai: improving some on cymbalta and neurontin--change cymbalta to AM dosing, titrate neurontin dose to optimize therapeutic effect/minimize sedation effect.    Encouraged her to keep active, increase exercise like she says she plans to do.  3) Hyperlipidemia: recheck fasting lipids today.  FOLLOW UP: 4 mo

## 2012-11-04 LAB — LDL CHOLESTEROL, DIRECT: Direct LDL: 227.5 mg/dL

## 2012-11-08 ENCOUNTER — Encounter: Payer: Self-pay | Admitting: Family Medicine

## 2012-11-08 DIAGNOSIS — E782 Mixed hyperlipidemia: Secondary | ICD-10-CM | POA: Insufficient documentation

## 2012-11-09 ENCOUNTER — Telehealth: Payer: Self-pay | Admitting: Family Medicine

## 2012-11-09 NOTE — Telephone Encounter (Signed)
Result note sent to April just now.--thx

## 2012-11-10 ENCOUNTER — Telehealth: Payer: Self-pay | Admitting: *Deleted

## 2012-11-10 MED ORDER — ATORVASTATIN CALCIUM 10 MG PO TABS: 10.0000 mg | ORAL_TABLET | Freq: Every day | ORAL | Status: DC

## 2012-11-10 NOTE — Telephone Encounter (Addendum)
Spoke with pt RE: lab results & further provider instructions; Patient understood & agreed, new Rx to pharmacy/SLS  Notes Recorded by Jeoffrey Massed, MD on 11/09/2012 at 10:33 PM Pls notify pt that her recent labs were stable except her "bad" cholesterol is too high. She needs to eat a low chol/low fat diet but she also needs med in addition to this in order to get it down into goal range or close to it (it was 227 and it needs to be <130. Start atorvastatin 10mg , 1 tab po qd, #30, RF x 2--pls send to pharmacy. Recheck chol panel in 2 mo.-thx

## 2012-11-10 NOTE — Telephone Encounter (Signed)
[name] will call patient with results.

## 2012-11-10 NOTE — Telephone Encounter (Signed)
Spoke w/patient RE: lab results and pt inquired about anti-inflammatory medication; reports that she was changed from Celebrex to Mobic d/t cost of medication; pt would like to know if we have any type of discount for Celebrex available, as she would prefer to stay on Rx d/t "prefers capsules over tablets d/t swallowing easier"/SLS Please advise.

## 2012-11-10 NOTE — Addendum Note (Signed)
Addended by: Regis Bill on: 11/10/2012 11:49 AM   Modules accepted: Orders

## 2012-12-15 ENCOUNTER — Telehealth: Payer: Self-pay | Admitting: *Deleted

## 2012-12-15 MED ORDER — DULOXETINE HCL 60 MG PO CPEP: 60.0000 mg | ORAL_CAPSULE | Freq: Every day | ORAL | Status: DC

## 2012-12-15 NOTE — Telephone Encounter (Signed)
Faxed request for Duloxetine Last Rx: 03.05.14 #30x2 Last OV: 04.23.14 Return: 4-mth [Appt 08.20.14] Per [facility] Refill Protocol, Rx request to pharmacySLS

## 2013-01-24 ENCOUNTER — Other Ambulatory Visit (HOSPITAL_COMMUNITY): Payer: Self-pay | Admitting: Urology

## 2013-01-24 ENCOUNTER — Ambulatory Visit (HOSPITAL_COMMUNITY)
Admission: RE | Admit: 2013-01-24 | Discharge: 2013-01-24 | Disposition: A | Discharge status: Home / Self Care | Payer: BC Managed Care – PPO | Source: Ambulatory Visit | Attending: Urology | Admitting: Urology

## 2013-01-24 DIAGNOSIS — I10 Essential (primary) hypertension: Secondary | ICD-10-CM | POA: Insufficient documentation

## 2013-01-24 DIAGNOSIS — C649 Malignant neoplasm of unspecified kidney, except renal pelvis: Secondary | ICD-10-CM

## 2013-02-21 ENCOUNTER — Other Ambulatory Visit: Payer: Self-pay | Admitting: Family Medicine

## 2013-02-21 MED ORDER — ATORVASTATIN CALCIUM 10 MG PO TABS: 10.0000 mg | ORAL_TABLET | Freq: Every day | ORAL | Status: DC

## 2013-03-02 ENCOUNTER — Ambulatory Visit: Payer: BC Managed Care – PPO | Admitting: Family Medicine

## 2013-03-09 ENCOUNTER — Encounter: Payer: Self-pay | Admitting: Family Medicine

## 2013-03-09 ENCOUNTER — Encounter: Payer: Self-pay | Admitting: Internal Medicine

## 2013-03-09 ENCOUNTER — Ambulatory Visit (INDEPENDENT_AMBULATORY_CARE_PROVIDER_SITE_OTHER): Payer: BC Managed Care – PPO | Admitting: Family Medicine

## 2013-03-09 VITALS — BP 110/79 | HR 98 | Temp 97.3°F | Resp 18 | Ht 62.0 in | Wt 186.0 lb

## 2013-03-09 DIAGNOSIS — Z1211 Encounter for screening for malignant neoplasm of colon: Secondary | ICD-10-CM

## 2013-03-09 DIAGNOSIS — E785 Hyperlipidemia, unspecified: Secondary | ICD-10-CM

## 2013-03-09 DIAGNOSIS — IMO0001 Reserved for inherently not codable concepts without codable children: Secondary | ICD-10-CM

## 2013-03-09 LAB — LIPID PANEL
HDL: 82 mg/dL (ref >39.00)
VLDL: 46.8 mg/dL — ABNORMAL HIGH (ref 0.0–40.0)

## 2013-03-09 LAB — CK: Total CK: 125 U/L (ref 7–177)

## 2013-03-09 MED ORDER — GABAPENTIN 300 MG PO CAPS: ORAL_CAPSULE | ORAL | Status: DC

## 2013-03-09 NOTE — Progress Notes (Signed)
OFFICE NOTE  03/09/2013  CC:  Chief Complaint  Patient presents with  . Follow-up    3 month  . Hyperlipidemia    fasting for blood work      HPI: Patient is a 57 y.o. Caucasian female who is here for 3 mo f/u fibromyalgia, HTN, and hyperlipidemia. Started atorvastatin 2-3 mo ago and is fasting today for repeat lipids.  Taking atorv regularly, diet is fair.  No signif side effects she can put her finger on.  PAIN: still bad, daily, describes it in buth sides of sacrum and in midline sacrococcygeal area.   She is working daily and exercising 2-3 times per week--this makes her feel better.  Also hurting in soft tissues of neck and trapezius regions bilat, anterior legs tops of feet hurt and burn.    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 Gets 4-5 hours of sleep per night usually.   Pertinent PMH:  Past Medical History  Diagnosis Date  . Overweight(278.02)   . History of bronchitis     childhood, wood stove and coal heat  . History of galactorrhea 02/05/2011  . Knee pain, bilateral 02/05/2011  . Allergy   . GERD (gastroesophageal reflux disease)   . Heart murmur   . Preventative health care 02/05/2011  . Recurrent acute tonsillitis 03/05/2011  . Bronchitis, acute 02/05/2012    no current problems 07-05-12  . Hyperlipidemia, mixed   . 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 Hypertension   . Arthritis     back  . Hemorrhoids    Past surgical, social, and family history reviewed and no changes noted since last office visit.  MEDS:  Outpatient Prescriptions Prior to Visit  Medication Sig Dispense Refill  . atorvastatin (LIPITOR) 10 MG tablet Take 1 tablet (10 mg total) by mouth daily.  30 tablet  2  . cyclobenzaprine (FLEXERIL) 10 MG tablet Take 1 tablet (10 mg total) by mouth 2 (two) times daily as needed. Muscle spasms  60 tablet  3  . DULoxetine  (CYMBALTA) 60 MG capsule Take 1 capsule (60 mg total) by mouth daily.  30 capsule  2  . gabapentin (NEURONTIN) 300 MG capsule Take 300 mg by mouth 3 (three) times daily.      Marland Kitchen losartan (COZAAR) 50 MG tablet Take 1-2 tablets (50-100 mg total) by mouth as needed.  60 tablet  5  . meloxicam (MOBIC) 15 MG tablet 1 tab po qd prn musculoskeletal pain  30 tablet  3  . omeprazole (PRILOSEC) 20 MG capsule Take 1 capsule by mouth Daily.      . sennosides-docusate sodium (SENOKOT-S) 8.6-50 MG tablet Take 1 tablet by mouth 2 (two) times daily. While taking pain meds to prevent constipation  30 tablet  0  . diphenhydramine-acetaminophen (TYLENOL PM) 25-500 MG TABS Take 1 tablet by mouth at bedtime as needed.      . Multiple Vitamin (MULTIVITAMIN WITH MINERALS) TABS Take 1 tablet by mouth daily.      Marland Kitchen OVER THE COUNTER MEDICATION Take 2 tablets by mouth daily. RELACORE- natural weight loss supplement       No facility-administered medications prior to visit.    PE: Blood pressure 110/79, pulse 98, temperature 97.3 F (36.3 C), temperature source Temporal, resp. rate 18, height 5\' 2"  (1.575 m), weight 186 lb (84.369 kg), SpO2 97.00%. Gen: Alert, well appearing.  Patient  is oriented to person, place, time, and situation. CV: RRR, no m/r/g.   LUNGS: CTA bilat, nonlabored resps, good aeration in all lung fields. She has tender points in neck, shoulders, mid and low back, sacral region, hips/gluts, thighs, anterior legs, both upper and lower arms.  No rash.  No joint swelling or erythema.  IMPRESSION AND PLAN:  1) Hyperlipidemia: repeat FLP today and see how statin has helped her. Low chol/low fat diet.  2) Fibromyalgia: decent control.  Stressed the importance of getting 7 hours of sleep per night.  Continue exercise and meds--with a focus on trying to titrate her neurontin to 600 mg tid.  3) HTN-stable.  4) Prev health: referral to GI ordered today for colon cancer screening.  FOLLOW UP: 3 mo for her  annual health maintenance exam (she gets pap/pelvic/mammo through her gyn).

## 2013-03-10 LAB — LDL CHOLESTEROL, DIRECT: Direct LDL: 177.1 mg/dL

## 2013-03-16 MED ORDER — ATORVASTATIN CALCIUM 40 MG PO TABS: 40.0000 mg | ORAL_TABLET | Freq: Every day | ORAL | Status: DC

## 2013-03-16 NOTE — Addendum Note (Signed)
Addended by: Eulah Pont on: 03/16/2013 08:42 AM   Modules accepted: Orders

## 2013-03-23 ENCOUNTER — Other Ambulatory Visit: Payer: Self-pay | Admitting: Family Medicine

## 2013-03-23 MED ORDER — DULOXETINE HCL 60 MG PO CPEP: 60.0000 mg | ORAL_CAPSULE | Freq: Every day | ORAL | Status: DC

## 2013-04-11 ENCOUNTER — Ambulatory Visit (AMBULATORY_SURGERY_CENTER): Payer: Self-pay | Admitting: *Deleted

## 2013-04-11 VITALS — Ht 62.0 in | Wt 187.8 lb

## 2013-04-11 DIAGNOSIS — Z1211 Encounter for screening for malignant neoplasm of colon: Secondary | ICD-10-CM

## 2013-04-11 MED ORDER — NA SULFATE-K SULFATE-MG SULF 17.5-3.13-1.6 GM/177ML PO SOLN: ORAL | Status: DC

## 2013-04-11 NOTE — Progress Notes (Signed)
Patient denies any allergies to eggs or soy. Patient denies any problems with anesthesia.  

## 2013-04-12 ENCOUNTER — Encounter: Payer: Self-pay | Admitting: Internal Medicine

## 2013-04-13 HISTORY — PX: COLONOSCOPY: SHX174

## 2013-04-25 ENCOUNTER — Encounter: Payer: Self-pay | Admitting: Internal Medicine

## 2013-04-25 ENCOUNTER — Ambulatory Visit (AMBULATORY_SURGERY_CENTER): Payer: BC Managed Care – PPO | Admitting: Internal Medicine

## 2013-04-25 VITALS — BP 112/64 | HR 65 | Temp 97.4°F | Resp 10 | Ht 62.0 in | Wt 187.0 lb

## 2013-04-25 DIAGNOSIS — Z1211 Encounter for screening for malignant neoplasm of colon: Secondary | ICD-10-CM

## 2013-04-25 DIAGNOSIS — D126 Benign neoplasm of colon, unspecified: Secondary | ICD-10-CM

## 2013-04-25 DIAGNOSIS — Z800 Family history of malignant neoplasm of digestive organs: Secondary | ICD-10-CM

## 2013-04-25 MED ORDER — SODIUM CHLORIDE 0.9 % IV SOLN: 500.0000 mL | INTRAVENOUS | Status: DC

## 2013-04-25 NOTE — Progress Notes (Signed)
Called to room to assist during endoscopic procedure.  Patient ID and intended procedure confirmed with present staff. Received instructions for my participation in the procedure from the performing physician.  

## 2013-04-25 NOTE — Op Note (Signed)
[facility] [address] [place], [address]   COLONOSCOPY PROCEDURE REPORT  PATIENT: [name], [name]  MR#: [id number] BIRTHDATE: 05/20/1956 , 57  yrs. old GENDER: Female ENDOSCOPIST: Iva Boop, MD, [facility] REFERRED [id number] Milinda Cave, M.D. PROCEDURE DATE:  04/25/2013 PROCEDURE:   Colonoscopy with biopsy First Screening Colonoscopy - Avg.  risk and is 50 yrs.  old or older Yes.  Prior Negative Screening - Now for repeat screening. N/A  History of Adenoma - Now for follow-up colonoscopy & has been > or = to 3 yrs.  N/A  Polyps Removed Today? Yes. ASA CLASS:   Class II INDICATIONS:elevated risk screening, Patient's immediate family history of colon cancer - father 60, and first colonoscopy. MEDICATIONS: propofol (Diprivan) 200mg  IV, MAC sedation, administered by CRNA, and These medications were titrated to patient response per physician's verbal order  DESCRIPTION OF PROCEDURE:   After the risks benefits and alternatives of the procedure were thoroughly explained, informed consent was obtained.  A digital rectal exam revealed no abnormalities of the rectum.   The LB [id number]-[id number]  endoscope was introduced through the anus and advanced to the cecum, which was identified by both the appendix and ileocecal valve. No adverse events experienced.   The quality of the prep was excellent using Suprep  The instrument was then slowly withdrawn as the colon was fully examined.   COLON FINDINGS: A sessile polyp measuring 2 mm in size was found at the cecum.  A polypectomy was performed with cold forceps.  The resection was complete and the polyp tissue was completely retrieved.   The colon mucosa was otherwise normal.   A right colon retroflexion was performed.  Retroflexed views revealed no abnormalities. The time to cecum=2 minutes 37 seconds.  Withdrawal time=8 minutes 44 seconds.  The scope was withdrawn and the procedure completed. COMPLICATIONS: There were no  complications.  ENDOSCOPIC IMPRESSION: 1.   Sessile polyp measuring 2 mm in size was found at the cecum; polypectomy was performed with cold forceps 2.   The colon mucosa was otherwise normal - excellent prep - first colonoscopy in patient w/ FHx CRCA  RECOMMENDATIONS: Timing of repeat colonoscopy will be determined by pathology findings. Likely 5 years (2019)   eSigned:  Iva Boop, MD, [facility] 04/25/2013 9:57 AM   cc: Earley Favor, MD and The Patient

## 2013-04-25 NOTE — Progress Notes (Signed)
Patient did not experience any of the following events: a burn prior to discharge; a fall within the facility; wrong site/side/patient/procedure/implant event; or a hospital transfer or hospital admission upon discharge from the facility. (G8907) Patient did not have preoperative order for IV antibiotic SSI prophylaxis. (G8918)  

## 2013-04-25 NOTE — Progress Notes (Signed)
Procedure ends, to recovery, report given and VSS. 

## 2013-04-25 NOTE — Patient Instructions (Addendum)
I found and removed one tiny (possible) polyp. It looks benign. Everything else was ok - normal and the prep was great.  I will let you know pathology results and when to have another routine colonoscopy by mail. Should be in 5 years - 2019.  Increase fiber in your diet and this can help your constipation. Fiber supplements (Benefiber or generic version) 2-3 tablespoons a day can also help. If these fail try MiraLax daily or as often as needed. If that fails come back to see me.  It is the time of year to have a vaccination to prevent the flu (influenza virus). Please have this done through your primary care provider or you can get this done at local pharmacies or the [facility]. It would be very helpful if you notify your primary care provider when and where you had the vaccination given by messaging them in My Chart, leaving a message or faxing the information.  I appreciate the opportunity to care for you. Iva Boop, MD, FACG   YOU HAD AN ENDOSCOPIC PROCEDURE TODAY AT THE [facility]: Refer to the procedure report that was given to you for any specific questions about what was found during the examination.  If the procedure report does not answer your questions, please call your gastroenterologist to clarify.  If you requested that your care partner not be given the details of your procedure findings, then the procedure report has been included in a sealed envelope for you to review at your convenience later.  YOU SHOULD EXPECT: Some feelings of bloating in the abdomen. Passage of more gas than usual.  Walking can help get rid of the air that was put into your GI tract during the procedure and reduce the bloating. If you had a lower endoscopy (such as a colonoscopy or flexible sigmoidoscopy) you may notice spotting of blood in your stool or on the toilet paper. If you underwent a bowel prep for your procedure, then you may not have a normal bowel movement for a few  days.  DIET: Your first meal following the procedure should be a light meal and then it is ok to progress to your normal diet.  A half-sandwich or bowl of soup is an example of a good first meal.  Heavy or fried foods are harder to digest and may make you feel nauseous or bloated.  Likewise meals heavy in dairy and vegetables can cause extra gas to form and this can also increase the bloating.  Drink plenty of fluids but you should avoid alcoholic beverages for 24 hours.  ACTIVITY: Your care partner should take you home directly after the procedure.  You should plan to take it easy, moving slowly for the rest of the day.  You can resume normal activity the day after the procedure however you should NOT DRIVE or use heavy machinery for 24 hours (because of the sedation medicines used during the test).    SYMPTOMS TO REPORT IMMEDIATELY: A gastroenterologist can be reached at any hour.  During normal business hours, 8:30 AM to 5:00 PM Monday through Friday, call [phone].  After hours and on weekends, please call the GI answering service at [phone] who will take a message and have the physician on call contact you.   Following lower endoscopy (colonoscopy or flexible sigmoidoscopy):  Excessive amounts of blood in the stool  Significant tenderness or worsening of abdominal pains  Swelling of the abdomen that is new, acute  Fever of  100F or higher  FOLLOW UP: If any biopsies were taken you will be contacted by phone or by letter within the next 1-3 weeks.  Call your gastroenterologist if you have not heard about the biopsies in 3 weeks.  Our staff will call the home number listed on your records the next business day following your procedure to check on you and address any questions or concerns that you may have at that time regarding the information given to you following your procedure. This is a courtesy call and so if there is no answer at the home number and we have not heard from you  through the emergency physician on call, we will assume that you have returned to your regular daily activities without incident.  SIGNATURES/CONFIDENTIALITY: You and/or your care partner have signed paperwork which will be entered into your electronic medical record.  These signatures attest to the fact that that the information above on your After Visit Summary has been reviewed and is understood.  Full responsibility of the confidentiality of this discharge information lies with you and/or your care-partner.  Please read over handout about polyps  Continue your normal medications

## 2013-04-26 ENCOUNTER — Telehealth: Payer: Self-pay | Admitting: *Deleted

## 2013-04-26 NOTE — Telephone Encounter (Signed)
Name identifier, left message, follow-up 

## 2013-04-29 ENCOUNTER — Encounter: Payer: Self-pay | Admitting: Internal Medicine

## 2013-04-29 NOTE — Progress Notes (Signed)
Quick Note:  Not a polyp Repeat colonoscopy 2019 (FHx CRCA) ______

## 2013-05-02 ENCOUNTER — Other Ambulatory Visit: Payer: Self-pay | Admitting: Family Medicine

## 2013-05-02 MED ORDER — DULOXETINE HCL 60 MG PO CPEP: 60.0000 mg | ORAL_CAPSULE | Freq: Every day | ORAL | Status: DC

## 2013-05-02 MED ORDER — ATORVASTATIN CALCIUM 40 MG PO TABS: 40.0000 mg | ORAL_TABLET | Freq: Every day | ORAL | Status: DC

## 2013-05-03 ENCOUNTER — Other Ambulatory Visit: Payer: Self-pay | Admitting: Family Medicine

## 2013-05-03 NOTE — Telephone Encounter (Signed)
Medication request was already complete yesterday.

## 2013-05-11 ENCOUNTER — Telehealth: Payer: Self-pay | Admitting: Family Medicine

## 2013-05-11 MED ORDER — OXYCODONE HCL 5 MG PO TABS: ORAL_TABLET | ORAL | Status: DC

## 2013-05-11 NOTE — Telephone Encounter (Signed)
Patient fell out of a chair & hurt her back. She has been taking a muscle relaxer at night & tylenol during the day. She also tried Gabapentin but these are not helping her. Patient is requesting oxycodone. Can she pick up an Rx on her lunch hour today?

## 2013-05-11 NOTE — Telephone Encounter (Signed)
I printed rx for oxycodone 5mg  tabs, #20. She must come in and see me if she needs pain meds beyond this.-thx

## 2013-05-11 NOTE — Telephone Encounter (Signed)
Please advise 

## 2013-05-25 ENCOUNTER — Other Ambulatory Visit: Payer: Self-pay | Admitting: Family Medicine

## 2013-05-25 ENCOUNTER — Telehealth: Payer: Self-pay | Admitting: Family Medicine

## 2013-05-25 DIAGNOSIS — I10 Essential (primary) hypertension: Secondary | ICD-10-CM

## 2013-05-25 DIAGNOSIS — Z0000 Encounter for general adult medical examination without abnormal findings: Secondary | ICD-10-CM

## 2013-05-25 DIAGNOSIS — E785 Hyperlipidemia, unspecified: Secondary | ICD-10-CM

## 2013-05-25 DIAGNOSIS — N189 Chronic kidney disease, unspecified: Secondary | ICD-10-CM

## 2013-05-25 NOTE — Telephone Encounter (Signed)
Please fax CPE lab orders to [facility] [phone].

## 2013-05-25 NOTE — Telephone Encounter (Signed)
Faxed orders

## 2013-05-25 NOTE — Telephone Encounter (Signed)
Orders are in

## 2013-05-25 NOTE — Telephone Encounter (Signed)
Please advise 

## 2013-05-26 ENCOUNTER — Other Ambulatory Visit: Payer: Self-pay | Admitting: Family Medicine

## 2013-05-26 LAB — CBC WITH DIFFERENTIAL/PLATELET
Basophils Absolute: 0 10*3/uL (ref 0.0–0.1)
Eosinophils Relative: 3 % (ref 0–5)
Lymphocytes Relative: 35 % (ref 12–46)
Lymphs Abs: 2 10*3/uL (ref 0.7–4.0)
Monocytes Relative: 7 % (ref 3–12)
Neutro Abs: 3 10*3/uL (ref 1.7–7.7)
Neutrophils Relative %: 54 % (ref 43–77)
Platelets: 299 10*3/uL (ref 150–400)
RBC: 3.93 MIL/uL (ref 3.87–5.11)
RDW: 14.1 % (ref 11.5–15.5)
WBC: 5.6 10*3/uL (ref 4.0–10.5)

## 2013-05-26 LAB — COMPREHENSIVE METABOLIC PANEL
ALT: 19 U/L (ref 0–35)
AST: 15 U/L (ref 0–37)
CO2: 28 mEq/L (ref 19–32)
Calcium: 9.3 mg/dL (ref 8.4–10.5)
Chloride: 101 mEq/L (ref 96–112)
Sodium: 139 mEq/L (ref 135–145)
Total Bilirubin: 0.5 mg/dL (ref 0.3–1.2)
Total Protein: 6.9 g/dL (ref 6.0–8.3)

## 2013-05-26 LAB — LIPID PANEL
Cholesterol: 201 mg/dL — ABNORMAL HIGH (ref 0–200)
Total CHOL/HDL Ratio: 2.8 Ratio
VLDL: 40 mg/dL (ref 0–40)

## 2013-05-27 ENCOUNTER — Telehealth: Payer: Self-pay | Admitting: Family Medicine

## 2013-05-27 ENCOUNTER — Other Ambulatory Visit: Payer: Self-pay | Admitting: Family Medicine

## 2013-05-27 DIAGNOSIS — E782 Mixed hyperlipidemia: Secondary | ICD-10-CM

## 2013-05-27 DIAGNOSIS — Z905 Acquired absence of kidney: Secondary | ICD-10-CM

## 2013-05-27 LAB — TSH: TSH: 1.513 u[IU]/mL (ref 0.350–4.500)

## 2013-05-27 MED ORDER — MELOXICAM 15 MG PO TABS: ORAL_TABLET | ORAL | Status: DC

## 2013-05-27 NOTE — Telephone Encounter (Signed)
Mobic rx done.

## 2013-05-27 NOTE — Telephone Encounter (Signed)
Patient requesting mobic refill.  Patient last 03/09/13.  Last rx was on 11/03/12 x 3 refills.  Please advise refill.

## 2013-06-01 ENCOUNTER — Other Ambulatory Visit: Payer: BC Managed Care – PPO

## 2013-06-10 ENCOUNTER — Ambulatory Visit (INDEPENDENT_AMBULATORY_CARE_PROVIDER_SITE_OTHER): Payer: BC Managed Care – PPO | Admitting: Family Medicine

## 2013-06-10 ENCOUNTER — Encounter: Payer: BC Managed Care – PPO | Admitting: Family Medicine

## 2013-06-10 ENCOUNTER — Encounter: Payer: Self-pay | Admitting: Family Medicine

## 2013-06-10 VITALS — BP 126/86 | HR 93 | Temp 98.0°F | Ht 62.16 in | Wt 189.0 lb

## 2013-06-10 DIAGNOSIS — Z0000 Encounter for general adult medical examination without abnormal findings: Secondary | ICD-10-CM

## 2013-06-10 NOTE — Progress Notes (Signed)
Pre-visit discussion using our clinic review tool. No additional management support is needed unless otherwise documented below in the visit note.  

## 2013-06-10 NOTE — Patient Instructions (Signed)

## 2013-06-10 NOTE — Progress Notes (Signed)
Office Note 06/10/2013  CC:  Chief Complaint  Patient presents with  . Annual Exam    HPI:  [name] is a 57 y.o. White female who is here today for annual health maintenance exam. She recently had her first screening colonoscopy and this was normal.  Due to FH of CRCA she will get repeat colonoscopy in 5 yrs. She gets screening pap/pelvic, mammograms, DEXA scans through her GYN.   Past Medical History  Diagnosis Date  . Overweight(278.02)   . History of bronchitis     childhood, wood stove and coal heat  . History of galactorrhea 02/05/2011  . Knee pain, bilateral 02/05/2011  . Allergy   . GERD (gastroesophageal reflux disease)   . Heart murmur   . Recurrent acute tonsillitis 03/05/2011  . Bronchitis, acute 02/05/2012    no current problems 07-05-12  . Hyperlipidemia, mixed   . 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 Hypertension   . Arthritis     back  . Hemorrhoids   . Fibromyalgia     Past Surgical History  Procedure Laterality Date  . Cesarean section  1983  . Tubal ligation    . Tonsillectomy    . Robot assisted laparoscopic nephrectomy  07/12/2012    Procedure: ROBOTIC ASSISTED LAPAROSCOPIC NEPHRECTOMY;  Surgeon: Sebastian Ache, MD;  Location: [facility];  Service: Urology;  Laterality: Right;  . Colonoscopy  04/2013    Normal.  Repeat 2019 (FH of CRCA    Family History  Problem Relation Age of Onset  . Thyroid disease Mother   . Heart disease Mother     CHF, heart murmur, PAD  . Kidney disease Mother     RAS  . Other Mother 20    bowel obstruction,surgery this year  . Colon cancer Father 70  . Cancer Sister     cervical, breast  . Thyroid disease Sister   . Heart disease Brother   . Thyroid disease Maternal Grandfather   . Cancer Maternal Grandfather   . Diabetes Paternal Grandmother   . Cancer Paternal Grandmother     breast  . Heart disease Paternal Grandmother   . Heart disease  Paternal Grandfather   . Diabetes Paternal Grandfather   . Hypertension Paternal Grandfather   . Thyroid disease Sister   . Diabetes Sister   . Diabetes Sister   . Hypertension Sister   . Cancer Paternal Aunt     breast cancer  . Heart disease Maternal Grandmother   . Thyroid disease Maternal Grandmother   . Cancer Sister     kidney  . Asthma Daughter   . Allergies Daughter   . Lung disease Son     recurrent bronchitis  . Lung disease Daughter     recurrent bronchitis    History   Social History  . Marital Status: Married    Spouse Name: Sharl Ma    Number of Children: 3  . Years of Education: N/A   Occupational History  . Not on file.   Social History Main Topics  . Smoking status: Former Smoker -- 2.00 packs/day for 5 years    Types: Cigarettes    Quit date: 07/14/1981  . Smokeless tobacco: Never Used  . Alcohol Use: No  . Drug Use: No  . Sexual Activity: Yes   Other Topics Concern  . Not on file   Social History Narrative   Married, 3 children.   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   No tobacco, alcohol, or drugs.  No exercise.    Outpatient Prescriptions Prior to Visit  Medication Sig Dispense Refill  . atorvastatin (LIPITOR) 40 MG tablet Take 1 tablet (40 mg total) by mouth daily.  90 tablet  1  . cyclobenzaprine (FLEXERIL) 10 MG tablet Take 1 tablet (10 mg total) by mouth 2 (two) times daily as needed. Muscle spasms  60 tablet  3  . diphenhydramine-acetaminophen (TYLENOL PM) 25-500 MG TABS Take 1 tablet by mouth at bedtime as needed.      . DULoxetine (CYMBALTA) 60 MG capsule Take 1 capsule (60 mg total) by mouth daily.  90 capsule  1  . gabapentin (NEURONTIN) 300 MG capsule 2 caps tid  180 capsule  6  . Green Tea, Camillia sinensis, (GREEN TEA PO) Take by mouth daily.      Marland Kitchen losartan (COZAAR) 50 MG tablet Take 1-2 tablets (50-100 mg total) by mouth as needed.  60 tablet  5  . meloxicam (MOBIC) 15 MG tablet 1 tab po qd prn musculoskeletal pain  30  tablet  3  . omeprazole (PRILOSEC) 20 MG capsule Take 1 capsule by mouth Daily.      Marland Kitchen OVER THE COUNTER MEDICATION Take 2 tablets by mouth daily. RELACORE- natural weight loss supplement      . oxyCODONE (OXY IR/ROXICODONE) 5 MG immediate release tablet 1-2 tabs po q6h prn pain  20 tablet  0  . psyllium (METAMUCIL) 58.6 % powder Take 1 packet by mouth as needed.      . sennosides-docusate sodium (SENOKOT-S) 8.6-50 MG tablet Take 1 tablet by mouth 2 (two) times daily. While taking pain meds to prevent constipation  30 tablet  0  . Multiple Vitamin (MULTIVITAMIN WITH MINERALS) TABS Take 1 tablet by mouth daily.       No facility-administered medications prior to visit.    Allergies  Allergen Reactions  . Codeine Nausea Only  . Dilaudid [Hydromorphone Hcl] Nausea And Vomiting    ROS Review of Systems  Constitutional: Negative for fever, chills, appetite change and fatigue.  HENT: Negative for congestion, dental problem, ear pain and sore throat.   Eyes: Negative for discharge, redness and visual disturbance.  Respiratory: Negative for cough, chest tightness, shortness of breath and wheezing.   Cardiovascular: Negative for chest pain, palpitations and leg swelling.  Gastrointestinal: Negative for nausea, vomiting, abdominal pain, diarrhea and blood in stool.  Genitourinary: Negative for dysuria, urgency, frequency, hematuria, flank pain and difficulty urinating.  Musculoskeletal: Positive for arthralgias (some back and knees pain since a fall a few weeks ago). Negative for back pain, joint swelling, myalgias and neck stiffness.  Skin: Negative for pallor and rash.  Neurological: Negative for dizziness, speech difficulty, weakness and headaches.  Hematological: Negative for adenopathy. Does not bruise/bleed easily.  Psychiatric/Behavioral: Negative for confusion and sleep disturbance. The patient is not nervous/anxious.      PE; Blood pressure 126/86, pulse 93, temperature 98 F (36.7  C), temperature source Temporal, height 5' 2.16" (1.579 m), weight 189 lb (85.73 kg), SpO2 98.00%. Gen: Alert, well appearing.  Patient is oriented to person, place, time, and situation. AFFECT: pleasant, lucid thought and speech. ENT: Ears: EACs clear, normal epithelium.  TMs with good light reflex and landmarks bilaterally.  Eyes: no injection, icteris, swelling, or exudate.  EOMI, PERRLA.  Fundoscopy: clear disc margins and normal retinal vasculature. Nose: no drainage or turbinate edema/swelling.  No injection or focal lesion.  Mouth: lips without lesion/swelling.  Oral mucosa pink and moist.  Dentition intact and without obvious caries or gingival swelling.  Oropharynx without erythema, exudate, or swelling.  Neck: supple/nontender.  No LAD, mass, or TM.  Carotid pulses 2+ bilaterally, without bruits. CV: RRR, soft systolic flow murmur at cardiac base, without r/g.   LUNGS: CTA bilat, nonlabored resps, good aeration in all lung fields. ABD: soft, NT, ND, BS normal.  No hepatospenomegaly or mass.  No bruits. EXT: no clubbing, cyanosis, or edema.  Musculoskeletal: no joint swelling, erythema, warmth, or tenderness.  ROM of all joints intact. Skin - no sores or suspicious lesions or rashes or color changes Neuro: CN 2-12 intact bilaterally, strength 5/5 in proximal and distal upper extremities and lower extremities bilaterally.  No sensory deficits.  No tremor.  No disdiadochokinesis.  No ataxia.  Upper extremity and lower extremity DTRs symmetric.  No pronator drift.   Pertinent labs:  Lab Results  Component Value Date   WBC 5.6 05/26/2013   HGB 12.2 05/26/2013   HCT 35.3* 05/26/2013   MCV 89.8 05/26/2013   PLT 299 05/26/2013     Chemistry      Component Value Date/Time   NA 139 05/26/2013 1447   K 4.6 05/26/2013 1447   CL 101 05/26/2013 1447   CO2 28 05/26/2013 1447   BUN 19 05/26/2013 1447   CREATININE 1.03 05/26/2013 1447   CREATININE 1.2 11/03/2012 0929      Component Value  Date/Time   CALCIUM 9.3 05/26/2013 1447   ALKPHOS 100 05/26/2013 1447   AST 15 05/26/2013 1447   ALT 19 05/26/2013 1447   BILITOT 0.5 05/26/2013 1447     Lab Results  Component Value Date   TSH 1.513 05/26/2013   Lab Results  Component Value Date   CHOL 201* 05/26/2013   HDL 73 05/26/2013   LDLCALC 88 05/26/2013   LDLDIRECT 177.1 03/09/2013   TRIG 200* 05/26/2013   CHOLHDL 2.8 05/26/2013     ASSESSMENT AND PLAN:   Health maintenance examination Reviewed age and gender appropriate health maintenance issues (prudent diet, regular exercise, health risks of tobacco and excessive alcohol, use of seatbelts, fire alarms in home, use of sunscreen).  Also reviewed age and gender appropriate health screening as well as vaccine recommendations. UTD on vaccines. I reiterated the importance of her taking her calcium 1500mg  qd and vit D 800 IU qd. Will obtain records of paps from her GYN MD.    FOLLOW UP:  Return in about 6 months (around 12/08/2013) for f/u HTN, hyperlipidemia, fibro.

## 2013-06-10 NOTE — Assessment & Plan Note (Signed)
Reviewed age and gender appropriate health maintenance issues (prudent diet, regular exercise, health risks of tobacco and excessive alcohol, use of seatbelts, fire alarms in home, use of sunscreen).  Also reviewed age and gender appropriate health screening as well as vaccine recommendations. UTD on vaccines. I reiterated the importance of her taking her calcium 1500mg  qd and vit D 800 IU qd. Will obtain records of paps from her GYN MD.

## 2013-07-12 ENCOUNTER — Telehealth: Payer: Self-pay | Admitting: Family Medicine

## 2013-07-12 NOTE — Telephone Encounter (Signed)
Rx reordered.  

## 2013-07-12 NOTE — Telephone Encounter (Signed)
Pls do new eRx for gabapentin 300mg , 2 tabs po tid, #540 (90 day supply), RF x 3.

## 2013-07-12 NOTE — Telephone Encounter (Signed)
Patient called stating that the pharmacy has her instructions for gabapentin at 1 tab three times daily.  She states she has been taking two 300mg  tabs three times daily.  Please advise correct sig.

## 2013-07-22 ENCOUNTER — Encounter: Payer: Self-pay | Admitting: Family Medicine

## 2013-07-22 ENCOUNTER — Ambulatory Visit (INDEPENDENT_AMBULATORY_CARE_PROVIDER_SITE_OTHER): Payer: BC Managed Care – PPO | Admitting: Family Medicine

## 2013-07-22 VITALS — BP 110/72 | HR 85 | Temp 97.1°F | Resp 18 | Ht 62.0 in | Wt 192.0 lb

## 2013-07-22 DIAGNOSIS — J019 Acute sinusitis, unspecified: Secondary | ICD-10-CM

## 2013-07-22 MED ORDER — AZITHROMYCIN 250 MG PO TABS: ORAL_TABLET | ORAL | Status: DC

## 2013-07-22 NOTE — Progress Notes (Signed)
Pre visit review using our clinic review tool, if applicable. No additional management support is needed unless otherwise documented below in the visit note. 

## 2013-07-22 NOTE — Progress Notes (Signed)
OFFICE NOTE  07/22/2013  CC:  Chief Complaint  Patient presents with  . URI    x week      HPI: Patient is a 58 y.o. Caucasian female who is here for URI sx's. Onset a week ago, nasal/sinus pressure, upper teeth aching, peri-orbital HA, PND, coughing. Just finished bactrim for a UTI.  No fevers.  Pertinent PMH:  Past Medical History  Diagnosis Date  . Overweight(278.02)   . History of bronchitis     childhood, wood stove and coal heat  . History of galactorrhea 02/05/2011  . Knee pain, bilateral 02/05/2011  . Allergy   . GERD (gastroesophageal reflux disease)   . Heart murmur   . Recurrent acute tonsillitis 03/05/2011  . Bronchitis, acute 02/05/2012    no current problems 07-05-12  . Hyperlipidemia, mixed   . 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 Hypertension   . Arthritis     back  . Hemorrhoids   . Fibromyalgia     MEDS:  Outpatient Prescriptions Prior to Visit  Medication Sig Dispense Refill  . atorvastatin (LIPITOR) 40 MG tablet Take 1 tablet (40 mg total) by mouth daily.  90 tablet  1  . cyclobenzaprine (FLEXERIL) 10 MG tablet Take 1 tablet (10 mg total) by mouth 2 (two) times daily as needed. Muscle spasms  60 tablet  3  . diphenhydramine-acetaminophen (TYLENOL PM) 25-500 MG TABS Take 1 tablet by mouth at bedtime as needed.      . DULoxetine (CYMBALTA) 60 MG capsule Take 1 capsule (60 mg total) by mouth daily.  90 capsule  1  . gabapentin (NEURONTIN) 300 MG capsule 2 caps tid  180 capsule  6  . Green Tea, Camillia sinensis, (GREEN TEA PO) Take by mouth daily.      Marland Kitchen losartan (COZAAR) 50 MG tablet Take 1-2 tablets (50-100 mg total) by mouth as needed.  60 tablet  5  . meloxicam (MOBIC) 15 MG tablet 1 tab po qd prn musculoskeletal pain  30 tablet  3  . Multiple Vitamin (MULTIVITAMIN WITH MINERALS) TABS Take 1 tablet by mouth daily.      Marland Kitchen omeprazole (PRILOSEC) 20 MG capsule Take 1 capsule by mouth Daily.       Marland Kitchen OVER THE COUNTER MEDICATION Take 2 tablets by mouth daily. RELACORE- natural weight loss supplement      . sennosides-docusate sodium (SENOKOT-S) 8.6-50 MG tablet Take 1 tablet by mouth 2 (two) times daily. While taking pain meds to prevent constipation  30 tablet  0  . oxyCODONE (OXY IR/ROXICODONE) 5 MG immediate release tablet 1-2 tabs po q6h prn pain  20 tablet  0  . psyllium (METAMUCIL) 58.6 % powder Take 1 packet by mouth as needed.       No facility-administered medications prior to visit.    PE: Blood pressure 110/72, pulse 85, temperature 97.1 F (36.2 C), temperature source Temporal, resp. rate 18, height 5\' 2"  (1.575 m), weight 192 lb (87.091 kg), SpO2 98.00%. VS: noted--normal. Gen: alert, NAD, NONTOXIC APPEARING. HEENT: eyes without injection, drainage, or swelling.  Ears: EACs clear, TMs with normal light reflex and landmarks.  Nose: Clear rhinorrhea, with some dried, crusty exudate adherent to mildly injected mucosa.  No purulent d/c.  Diffuse paranasal sinus TTP.  No facial swelling.  Throat and mouth without focal lesion.  No pharyngial swelling, erythema, or exudate.   Neck: supple, no LAD.  Tender in submandibular region but no  focal LAD. LUNGS: CTA bilat, nonlabored resps.   CV: RRR, no m/r/g. EXT: no c/c/e SKIN: no rash    IMPRESSION AND PLAN:  Acute sinusitis. Azith x 5d. Robitussin DM or mucinex DM. Saline nasal spray discussed.  An After Visit Summary was printed and given to the patient.  FOLLOW UP: prn

## 2013-07-22 NOTE — Patient Instructions (Signed)
Robitussin DM or Mucinex DM as directed for adults.  Saline nasal spray (generic, OTC) several times per day.

## 2013-09-05 ENCOUNTER — Other Ambulatory Visit: Payer: Self-pay

## 2013-09-05 MED ORDER — LOSARTAN POTASSIUM 50 MG PO TABS: 50.0000 mg | ORAL_TABLET | ORAL | Status: DC | PRN

## 2013-09-07 ENCOUNTER — Other Ambulatory Visit: Payer: Self-pay | Admitting: Family Medicine

## 2013-09-07 DIAGNOSIS — Z905 Acquired absence of kidney: Secondary | ICD-10-CM

## 2013-09-07 DIAGNOSIS — E782 Mixed hyperlipidemia: Secondary | ICD-10-CM

## 2013-09-07 MED ORDER — LOSARTAN POTASSIUM 50 MG PO TABS: 50.0000 mg | ORAL_TABLET | ORAL | Status: DC | PRN

## 2013-09-26 ENCOUNTER — Encounter: Payer: Self-pay | Admitting: Family Medicine

## 2013-09-26 ENCOUNTER — Ambulatory Visit (INDEPENDENT_AMBULATORY_CARE_PROVIDER_SITE_OTHER): Payer: BC Managed Care – PPO | Admitting: Family Medicine

## 2013-09-26 VITALS — BP 134/79 | HR 94 | Temp 97.0°F | Resp 16 | Ht 62.0 in | Wt 195.0 lb

## 2013-09-26 DIAGNOSIS — M7051 Other bursitis of knee, right knee: Secondary | ICD-10-CM

## 2013-09-26 DIAGNOSIS — M7052 Other bursitis of knee, left knee: Secondary | ICD-10-CM

## 2013-09-26 DIAGNOSIS — IMO0002 Reserved for concepts with insufficient information to code with codable children: Secondary | ICD-10-CM

## 2013-09-26 DIAGNOSIS — M109 Gout, unspecified: Secondary | ICD-10-CM

## 2013-09-26 MED ORDER — PREDNISONE 20 MG PO TABS: ORAL_TABLET | ORAL | Status: DC

## 2013-09-26 MED ORDER — OXYCODONE HCL 5 MG PO TABS: ORAL_TABLET | ORAL | Status: DC

## 2013-09-26 NOTE — Progress Notes (Signed)
OFFICE NOTE  09/26/2013  CC:  Chief Complaint  Patient presents with  . Gout    possible gout, both great toes     HPI: Patient is a 58 y.o. Caucasian female who is here for toe pain. Onset about 5 d/a at right MTP joint, acute and severe--"like a fire".  Present constantly, but much worse with wt bearing, turned red and swelled a little bit, felt hot to touch.  No recent new. The next day the left MTP joint started to hurt but was much less severe. She has never had arthritis in toes or anywhere else like this before. No known hx of gout. Tylenol 650 + meloxicam 15mg  qd helped minimally.  Pertinent PMH:  Past medical, surgical, social, and family history reviewed and no changes are noted since last office visit.  MEDS:  Outpatient Prescriptions Prior to Visit  Medication Sig Dispense Refill  . atorvastatin (LIPITOR) 40 MG tablet Take 1 tablet (40 mg total) by mouth daily.  90 tablet  1  . cyclobenzaprine (FLEXERIL) 10 MG tablet Take 1 tablet (10 mg total) by mouth 2 (two) times daily as needed. Muscle spasms  60 tablet  3  . diphenhydramine-acetaminophen (TYLENOL PM) 25-500 MG TABS Take 1 tablet by mouth at bedtime as needed.      . DULoxetine (CYMBALTA) 60 MG capsule Take 1 capsule (60 mg total) by mouth daily.  90 capsule  1  . gabapentin (NEURONTIN) 300 MG capsule 2 caps tid  180 capsule  6  . Green Tea, Camillia sinensis, (GREEN TEA PO) Take by mouth daily.      Marland Kitchen losartan (COZAAR) 50 MG tablet Take 1-2 tablets (50-100 mg total) by mouth as needed.  60 tablet  3  . meloxicam (MOBIC) 15 MG tablet 1 tab po qd prn musculoskeletal pain  30 tablet  3  . Multiple Vitamin (MULTIVITAMIN WITH MINERALS) TABS Take 1 tablet by mouth daily.      Marland Kitchen omeprazole (PRILOSEC) 20 MG capsule Take 1 capsule by mouth Daily.      Marland Kitchen OVER THE COUNTER MEDICATION Take 2 tablets by mouth daily. RELACORE- natural weight loss supplement      . psyllium (METAMUCIL) 58.6 % powder Take 1 packet by mouth as  needed.      . sennosides-docusate sodium (SENOKOT-S) 8.6-50 MG tablet Take 1 tablet by mouth 2 (two) times daily. While taking pain meds to prevent constipation  30 tablet  0  . Wheat Dextrin (BENEFIBER PO) Take by mouth.      . oxyCODONE (OXY IR/ROXICODONE) 5 MG immediate release tablet 1-2 tabs po q6h prn pain  20 tablet  0  . azithromycin (ZITHROMAX) 250 MG tablet 2 tabs po qd x 1d, then 1 tab po qd x 4d  6 each  0   No facility-administered medications prior to visit.    PE: Blood pressure 134/79, pulse 94, temperature 97 F (36.1 C), temperature source Temporal, resp. rate 16, height 5\' 2"  (1.575 m), weight 195 lb (88.451 kg), SpO2 96.00%. Gen: Alert, well appearing.  Patient is oriented to person, place, time, and situation. CV: RRR, no m/r/g.   LUNGS: CTA bilat, nonlabored resps, good aeration in all lung fields. Knees: no effusion, warmth, or erythema.  Moderate TTP over pes anserine bursa bilat. ROM of knees intact, no instability. Ankles: ROM intact, no warmth or tenderness. Both MTP joints (R>L) are mildly warm and erythematous, no signif swelling.  These MTPs are mildly TTP and have mild  pain with active ROM.  Remainder of toes/feet nontender and non-erythematous.   IMPRESSION AND PLAN:  1) Podagra, bilat--first known episode gout. Check uric acid level today, CBC, BMET. Start prednisone 40mg  qd x 3d, then 20mg  qd x 3d, then 10mg  qd x 2d, then stop. Oxycodone 5mg , 1-2 tabs po q6h prn, #30, no RF. Low purine diet discussed, handout given.  2) Mild bilat pes anserine bursitis. Recommended ice to both sides.  The prednisone for her gout may help this problem some, too.  An After Visit Summary was printed and given to the patient.  FOLLOW UP:  Keep f/u already scheduled for 12/2013, earlier if further episodes of gout.

## 2013-09-27 ENCOUNTER — Encounter: Payer: Self-pay | Admitting: Family Medicine

## 2013-09-27 ENCOUNTER — Ambulatory Visit (INDEPENDENT_AMBULATORY_CARE_PROVIDER_SITE_OTHER): Payer: BC Managed Care – PPO | Admitting: Family Medicine

## 2013-09-27 VITALS — BP 128/79 | HR 91 | Temp 98.2°F | Resp 18 | Ht 62.0 in | Wt 193.0 lb

## 2013-09-27 DIAGNOSIS — J019 Acute sinusitis, unspecified: Secondary | ICD-10-CM

## 2013-09-27 MED ORDER — AZITHROMYCIN 250 MG PO TABS: ORAL_TABLET | ORAL | Status: DC

## 2013-09-27 MED ORDER — FLUCONAZOLE 150 MG PO TABS: ORAL_TABLET | ORAL | Status: DC

## 2013-09-27 NOTE — Progress Notes (Signed)
OFFICE NOTE  09/27/2013  CC:  Chief Complaint  Patient presents with  . Sinus Problem  . Eye Burn    last week  . Headache  . Otalgia    left sided      HPI: Patient is a 58 y.o. Caucasian female who is here for uri sx's. Approx 6 days of eyes itching and burning, nasal and sinus congestion and drainage, starting to get hoarse voice. PND causing some nausea this morning.  HA in peri-orbital and forehead area.  +upper teeth pain diffusely last 1-2 days. No eye discharge.  No fever.  Throat is sore.  No cough. Allegra 24h gel caps.  No other alllergy or URI meds.  Pertinent PMH:  Past medical, surgical, social, and family history reviewed and no changes are noted since last office visit.  MEDS:  Outpatient Prescriptions Prior to Visit  Medication Sig Dispense Refill  . atorvastatin (LIPITOR) 40 MG tablet Take 1 tablet (40 mg total) by mouth daily.  90 tablet  1  . cyclobenzaprine (FLEXERIL) 10 MG tablet Take 1 tablet (10 mg total) by mouth 2 (two) times daily as needed. Muscle spasms  60 tablet  3  . diphenhydramine-acetaminophen (TYLENOL PM) 25-500 MG TABS Take 1 tablet by mouth at bedtime as needed.      . DULoxetine (CYMBALTA) 60 MG capsule Take 1 capsule (60 mg total) by mouth daily.  90 capsule  1  . gabapentin (NEURONTIN) 300 MG capsule 2 caps tid  180 capsule  6  . Green Tea, Camillia sinensis, (GREEN TEA PO) Take by mouth daily.      Marland Kitchen losartan (COZAAR) 50 MG tablet Take 1-2 tablets (50-100 mg total) by mouth as needed.  60 tablet  3  . Multiple Vitamin (MULTIVITAMIN WITH MINERALS) TABS Take 1 tablet by mouth daily.      Marland Kitchen omeprazole (PRILOSEC) 20 MG capsule Take 1 capsule by mouth Daily.      Marland Kitchen OVER THE COUNTER MEDICATION Take 2 tablets by mouth daily. RELACORE- natural weight loss supplement      . oxyCODONE (OXY IR/ROXICODONE) 5 MG immediate release tablet 1-2 tabs po q6h prn pain  30 tablet  0  . predniSONE (DELTASONE) 20 MG tablet 2 tabs po qd x 3d, then 1 tab po qd  x 3d, then 1/2 tab po qd x 2d, then stop  10 tablet  0  . psyllium (METAMUCIL) 58.6 % powder Take 1 packet by mouth as needed.      . sennosides-docusate sodium (SENOKOT-S) 8.6-50 MG tablet Take 1 tablet by mouth 2 (two) times daily. While taking pain meds to prevent constipation  30 tablet  0  . Wheat Dextrin (BENEFIBER PO) Take by mouth.      . meloxicam (MOBIC) 15 MG tablet 1 tab po qd prn musculoskeletal pain  30 tablet  3   No facility-administered medications prior to visit.    PE: Blood pressure 128/79, pulse 91, temperature 98.2 F (36.8 C), temperature source Temporal, resp. rate 18, height 5\' 2"  (1.575 m), weight 193 lb (87.544 kg), SpO2 94.00%. VS: noted--normal. Gen: alert, NAD, NONTOXIC APPEARING. HEENT: eyes without injection, drainage, or swelling.  Ears: EACs clear, TMs with normal light reflex and landmarks.  Nose: Clear rhinorrhea, with some dried, crusty exudate adherent to mildly injected mucosa.  No purulent d/c.  Diffuse paranasal sinus TTP.  No facial swelling.  Throat and mouth without focal lesion.  No pharyngial swelling, erythema, or exudate.   Neck: supple,  no LAD.   LUNGS: CTA bilat, nonlabored resps.   CV: RRR, no m/r/g. EXT: no c/c/e SKIN: no rash  IMPRESSION AND PLAN:  Acute sinusitis. Z-pack rx'd, continue allegra 180 mg qd , add saline nasal spray. Prednisone I rx'd yesterday for her gout should help this some as well. Zaditor eye drops OTC recommended for prn use.  Pt requested fluconazole 150mg  for 1-2 use since she commonly gets yeast vaginitis when on antibiotics: rx given today.  An After Visit Summary was printed and given to the patient.  FOLLOW UP: prn

## 2013-09-27 NOTE — Patient Instructions (Signed)
Buy OTC generic eye drops : brand name is zaditor.  Use as directed on package.

## 2013-09-27 NOTE — Progress Notes (Signed)
Pre visit review using our clinic review tool, if applicable. No additional management support is needed unless otherwise documented below in the visit note. 

## 2013-09-28 LAB — BASIC METABOLIC PANEL
BUN: 22 mg/dL (ref 6–23)
CALCIUM: 9.4 mg/dL (ref 8.4–10.5)
CO2: 29 mEq/L (ref 19–32)
Chloride: 103 mEq/L (ref 96–112)
Creatinine, Ser: 1.6 mg/dL — ABNORMAL HIGH (ref 0.4–1.2)
GFR: 34.47 mL/min — AB (ref >60.00)
GLUCOSE: 90 mg/dL (ref 70–99)
POTASSIUM: 4.4 meq/L (ref 3.5–5.1)
Sodium: 141 mEq/L (ref 135–145)

## 2013-09-28 LAB — CBC WITH DIFFERENTIAL/PLATELET
Basophils Absolute: 0 10*3/uL (ref 0.0–0.1)
Basophils Relative: 0.7 % (ref 0.0–3.0)
EOS ABS: 0.1 10*3/uL (ref 0.0–0.7)
EOS PCT: 8.1 % — AB (ref 0.0–5.0)
HEMATOCRIT: 37.1 % (ref 36.0–46.0)
Hemoglobin: 12.4 g/dL (ref 12.0–15.0)
LYMPHS ABS: 1.1 10*3/uL (ref 0.7–4.0)
Lymphocytes Relative: 61.9 % — ABNORMAL HIGH (ref 12.0–46.0)
MCHC: 33.4 g/dL (ref 30.0–36.0)
MCV: 94.1 fl (ref 78.0–100.0)
MONO ABS: 0.3 10*3/uL (ref 0.1–1.0)
Monocytes Relative: 18.9 % — ABNORMAL HIGH (ref 3.0–12.0)
NEUTROS PCT: 10.4 % — AB (ref 43.0–77.0)
Neutro Abs: 0.2 10*3/uL — ABNORMAL LOW (ref 1.4–7.7)
PLATELETS: 205 10*3/uL (ref 150.0–400.0)
RBC: 3.94 Mil/uL (ref 3.87–5.11)
RDW: 13.4 % (ref 11.5–14.6)
WBC: 1.8 10*3/uL — CL (ref 4.5–10.5)

## 2013-09-28 LAB — URIC ACID: URIC ACID, SERUM: 6 mg/dL (ref 2.4–7.0)

## 2013-10-03 ENCOUNTER — Other Ambulatory Visit: Payer: Self-pay | Admitting: Family Medicine

## 2013-10-03 DIAGNOSIS — D72819 Decreased white blood cell count, unspecified: Secondary | ICD-10-CM

## 2013-10-03 DIAGNOSIS — R7989 Other specified abnormal findings of blood chemistry: Secondary | ICD-10-CM

## 2013-10-04 ENCOUNTER — Other Ambulatory Visit (INDEPENDENT_AMBULATORY_CARE_PROVIDER_SITE_OTHER): Payer: BC Managed Care – PPO

## 2013-10-04 DIAGNOSIS — R7989 Other specified abnormal findings of blood chemistry: Secondary | ICD-10-CM

## 2013-10-04 DIAGNOSIS — E785 Hyperlipidemia, unspecified: Secondary | ICD-10-CM

## 2013-10-04 DIAGNOSIS — I10 Essential (primary) hypertension: Secondary | ICD-10-CM

## 2013-10-04 DIAGNOSIS — D72819 Decreased white blood cell count, unspecified: Secondary | ICD-10-CM

## 2013-10-04 DIAGNOSIS — R799 Abnormal finding of blood chemistry, unspecified: Secondary | ICD-10-CM

## 2013-10-04 DIAGNOSIS — N189 Chronic kidney disease, unspecified: Secondary | ICD-10-CM

## 2013-10-04 DIAGNOSIS — Z0000 Encounter for general adult medical examination without abnormal findings: Secondary | ICD-10-CM

## 2013-10-04 LAB — COMPREHENSIVE METABOLIC PANEL
ALK PHOS: 85 U/L (ref 39–117)
ALT: 26 U/L (ref 0–35)
AST: 18 U/L (ref 0–37)
Albumin: 4 g/dL (ref 3.5–5.2)
BILIRUBIN TOTAL: 0.6 mg/dL (ref 0.3–1.2)
BUN: 22 mg/dL (ref 6–23)
CO2: 32 mEq/L (ref 19–32)
Calcium: 9.4 mg/dL (ref 8.4–10.5)
Chloride: 100 mEq/L (ref 96–112)
Creatinine, Ser: 1.3 mg/dL — ABNORMAL HIGH (ref 0.4–1.2)
GFR: 43.9 mL/min — AB (ref >60.00)
GLUCOSE: 85 mg/dL (ref 70–99)
Potassium: 4.2 mEq/L (ref 3.5–5.1)
Sodium: 139 mEq/L (ref 135–145)
Total Protein: 6.8 g/dL (ref 6.0–8.3)

## 2013-10-04 LAB — CBC WITH DIFFERENTIAL/PLATELET
BASOS ABS: 0.1 10*3/uL (ref 0.0–0.1)
Basophils Relative: 0.6 % (ref 0.0–3.0)
EOS PCT: 1.1 % (ref 0.0–5.0)
Eosinophils Absolute: 0.1 10*3/uL (ref 0.0–0.7)
HEMATOCRIT: 38.7 % (ref 36.0–46.0)
Hemoglobin: 12.7 g/dL (ref 12.0–15.0)
LYMPHS ABS: 4.3 10*3/uL — AB (ref 0.7–4.0)
Lymphocytes Relative: 40.5 % (ref 12.0–46.0)
MCHC: 32.8 g/dL (ref 30.0–36.0)
MCV: 95 fl (ref 78.0–100.0)
Monocytes Absolute: 0.3 10*3/uL (ref 0.1–1.0)
Monocytes Relative: 3.2 % (ref 3.0–12.0)
NEUTROS ABS: 5.8 10*3/uL (ref 1.4–7.7)
Neutrophils Relative %: 54.6 % (ref 43.0–77.0)
PLATELETS: 294 10*3/uL (ref 150.0–400.0)
RBC: 4.07 Mil/uL (ref 3.87–5.11)
RDW: 14.1 % (ref 11.5–14.6)
WBC: 10.6 10*3/uL — ABNORMAL HIGH (ref 4.5–10.5)

## 2013-10-04 LAB — LIPID PANEL
CHOLESTEROL: 217 mg/dL — AB (ref 0–200)
HDL: 100.8 mg/dL (ref >39.00)
LDL Cholesterol: 80 mg/dL (ref 0–99)
TRIGLYCERIDES: 181 mg/dL — AB (ref 0.0–149.0)
Total CHOL/HDL Ratio: 2
VLDL: 36.2 mg/dL (ref 0.0–40.0)

## 2013-10-04 LAB — TSH: TSH: 1.37 u[IU]/mL (ref 0.35–5.50)

## 2013-11-14 ENCOUNTER — Ambulatory Visit: Payer: BC Managed Care – PPO | Admitting: Family Medicine

## 2013-11-14 ENCOUNTER — Encounter: Payer: Self-pay | Admitting: Family Medicine

## 2013-11-14 ENCOUNTER — Other Ambulatory Visit: Payer: Self-pay | Admitting: Family Medicine

## 2013-11-14 ENCOUNTER — Ambulatory Visit (INDEPENDENT_AMBULATORY_CARE_PROVIDER_SITE_OTHER): Payer: BC Managed Care – PPO | Admitting: Family Medicine

## 2013-11-14 ENCOUNTER — Ambulatory Visit (HOSPITAL_BASED_OUTPATIENT_CLINIC_OR_DEPARTMENT_OTHER)
Admission: RE | Admit: 2013-11-14 | Discharge: 2013-11-14 | Disposition: A | Discharge status: Home / Self Care | Payer: BC Managed Care – PPO | Source: Ambulatory Visit | Attending: Family Medicine | Admitting: Family Medicine

## 2013-11-14 VITALS — BP 159/83 | HR 85 | Temp 97.8°F | Ht 62.0 in | Wt 198.5 lb

## 2013-11-14 DIAGNOSIS — M1710 Unilateral primary osteoarthritis, unspecified knee: Secondary | ICD-10-CM

## 2013-11-14 DIAGNOSIS — M179 Osteoarthritis of knee, unspecified: Secondary | ICD-10-CM

## 2013-11-14 DIAGNOSIS — M7051 Other bursitis of knee, right knee: Secondary | ICD-10-CM

## 2013-11-14 DIAGNOSIS — IMO0002 Reserved for concepts with insufficient information to code with codable children: Secondary | ICD-10-CM

## 2013-11-14 DIAGNOSIS — M7052 Other bursitis of knee, left knee: Secondary | ICD-10-CM

## 2013-11-14 DIAGNOSIS — Z905 Acquired absence of kidney: Secondary | ICD-10-CM

## 2013-11-14 DIAGNOSIS — E782 Mixed hyperlipidemia: Secondary | ICD-10-CM

## 2013-11-14 MED ORDER — MELOXICAM 15 MG PO TABS: ORAL_TABLET | ORAL | Status: DC

## 2013-11-14 MED ORDER — OXYCODONE HCL 5 MG PO TABS: ORAL_TABLET | ORAL | Status: DC

## 2013-11-14 MED ORDER — METHYLPREDNISOLONE ACETATE 40 MG/ML IJ SUSP
40.0000 mg | Freq: Once | INTRAMUSCULAR | Status: AC
  Administered 2013-11-14: 40 mg via INTRA_ARTICULAR

## 2013-11-14 NOTE — Progress Notes (Signed)
Pre visit review using our clinic review tool, if applicable. No additional management support is needed unless otherwise documented below in the visit note. 

## 2013-11-14 NOTE — Progress Notes (Signed)
OFFICE NOTE  11/14/2013  CC:  Chief Complaint  Patient presents with  . Knee Pain   HPI: Patient is a 58 y.o. Caucasian female who is here for bilateral knee pain, L>R, for about 2 mo. Medial aspect of knees seems to be the focus, left knee cap area with pain and that knee has some instability feeling and occ "gives out on me".  She works on her feet as a CMA all day.  Sx's a bit better with rest/non wt bearing. Denies warmth or redness of knee. Takes meloxicam daily lately. Takes oxycodone on W/E's only.  BPs at home 130s/80s.    Pertinent PMH:  Past medical, surgical, social, and family history reviewed and no changes are noted since last office visit.  MEDS:  Outpatient Prescriptions Prior to Visit  Medication Sig Dispense Refill  . atorvastatin (LIPITOR) 40 MG tablet Take 1 tablet (40 mg total) by mouth daily.  90 tablet  1  . cyclobenzaprine (FLEXERIL) 10 MG tablet Take 1 tablet (10 mg total) by mouth 2 (two) times daily as needed. Muscle spasms  60 tablet  3  . diphenhydramine-acetaminophen (TYLENOL PM) 25-500 MG TABS Take 1 tablet by mouth at bedtime as needed.      . DULoxetine (CYMBALTA) 60 MG capsule Take 1 capsule (60 mg total) by mouth daily.  90 capsule  1  . gabapentin (NEURONTIN) 300 MG capsule 2 caps tid  180 capsule  6  . Green Tea, Camillia sinensis, (GREEN TEA PO) Take by mouth daily.      Marland Kitchen losartan (COZAAR) 50 MG tablet Take 1-2 tablets (50-100 mg total) by mouth as needed.  60 tablet  3  . meloxicam (MOBIC) 15 MG tablet 1 tab po qd prn musculoskeletal pain  30 tablet  3  . Multiple Vitamin (MULTIVITAMIN WITH MINERALS) TABS Take 1 tablet by mouth daily.      Marland Kitchen omeprazole (PRILOSEC) 20 MG capsule Take 1 capsule by mouth Daily.      Marland Kitchen OVER THE COUNTER MEDICATION Take 2 tablets by mouth daily. RELACORE- natural weight loss supplement      . oxyCODONE (OXY IR/ROXICODONE) 5 MG immediate release tablet 1-2 tabs po q6h prn pain  30 tablet  0  . psyllium (METAMUCIL)  58.6 % powder Take 1 packet by mouth as needed.      . sennosides-docusate sodium (SENOKOT-S) 8.6-50 MG tablet Take 1 tablet by mouth 2 (two) times daily. While taking pain meds to prevent constipation  30 tablet  0  . Wheat Dextrin (BENEFIBER PO) Take by mouth.      Marland Kitchen azithromycin (ZITHROMAX) 250 MG tablet 2 tabs po qd x 1d, then 1 tab po qd x 4d  6 each  0  . fluconazole (DIFLUCAN) 150 MG tablet 1 tab po qd x 2d  2 tablet  1  . predniSONE (DELTASONE) 20 MG tablet 2 tabs po qd x 3d, then 1 tab po qd x 3d, then 1/2 tab po qd x 2d, then stop  10 tablet  0   No facility-administered medications prior to visit.  Not taking prednisone or azithromycin  PE: Blood pressure 159/83, pulse 85, temperature 97.8 F (36.6 C), temperature source Temporal, height '5\' 2"'  (1.575 m), weight 198 lb 8 oz (90.039 kg), SpO2 94.00%. Gen: Alert, well appearing.  Patient is oriented to person, place, time, and situation. Both knees with mild bony hypertrophy but no obvious effusion. No warmth or erythema.  ROM intact but crepitus noted bilat.  Patellar grind + on left, but ok on right. General TTP around knee cap bilat but also significant point tenderness over pes anserine bursa region bilaterally.  No knee instability and no lat or med collat lig tenderness.    IMPRESSION AND PLAN:  Knee pain, bilateral, subacute. Looking back in problem list, this was an issue a couple of years ago as well when she was seeing previous PMD. Suspect osteoarthritis, but I also think she has bilat pes anserine bursitis. Recommended ice application to pes anserine bursa regions bilat. Will give knee injection in both knees today.  Plan is to continue with daily meloxicam with food, oxycodone 36m q6h prn severe pain--use sparingly.   Procedure: Therapeutic knee injection (both right and left done today):  The patient's clinical condition is marked by substantial pain and/or significant functional disability.  Other conservative  therapy has not provided relief, is contraindicated, or not appropriate.  There is a reasonable likelihood that injection will significantly improve the patient's pain and/or functional disability. Cleaned skin with alcohol swab, used anterior approach to enter each knee joint, Injected 1 ml of depo medrol 468mml + 2 ml of 1% lidocaine w/out epi into joint space without resistance.  No immediate complications.  I initially tried approach inferomedial to patella on left knee and met bony resistance so I resorted to inferolateral approach and entered joint space w/out problem.  Patient tolerated procedure well.  Post-injection care discussed, including 20 min of icing 1-2 times in the next 4-8 hours, frequent non weight-bearing ROM exercises over the next few days, and general pain medication management.  An After Visit Summary was printed and given to the patient.  FOLLOW UP: 4 wks

## 2013-11-17 ENCOUNTER — Telehealth: Payer: Self-pay

## 2013-11-17 NOTE — Telephone Encounter (Signed)
Pt left a message wanting a note for work for Tuesday May 5. Note written and faxed to pt.

## 2013-11-22 ENCOUNTER — Ambulatory Visit: Payer: BC Managed Care – PPO | Admitting: Nurse Practitioner

## 2013-11-23 ENCOUNTER — Encounter: Payer: Self-pay | Admitting: *Deleted

## 2013-11-23 ENCOUNTER — Encounter: Payer: Self-pay | Admitting: Nurse Practitioner

## 2013-11-23 ENCOUNTER — Ambulatory Visit (INDEPENDENT_AMBULATORY_CARE_PROVIDER_SITE_OTHER): Payer: BC Managed Care – PPO | Admitting: Nurse Practitioner

## 2013-11-23 VITALS — BP 164/85 | HR 98 | Temp 97.5°F | Ht 62.0 in | Wt 193.0 lb

## 2013-11-23 DIAGNOSIS — M549 Dorsalgia, unspecified: Secondary | ICD-10-CM

## 2013-11-23 DIAGNOSIS — M179 Osteoarthritis of knee, unspecified: Secondary | ICD-10-CM | POA: Insufficient documentation

## 2013-11-23 DIAGNOSIS — R82998 Other abnormal findings in urine: Secondary | ICD-10-CM

## 2013-11-23 DIAGNOSIS — R6884 Jaw pain: Secondary | ICD-10-CM | POA: Insufficient documentation

## 2013-11-23 DIAGNOSIS — N390 Urinary tract infection, site not specified: Secondary | ICD-10-CM

## 2013-11-23 DIAGNOSIS — M1710 Unilateral primary osteoarthritis, unspecified knee: Secondary | ICD-10-CM

## 2013-11-23 DIAGNOSIS — R8290 Unspecified abnormal findings in urine: Secondary | ICD-10-CM

## 2013-11-23 DIAGNOSIS — IMO0002 Reserved for concepts with insufficient information to code with codable children: Secondary | ICD-10-CM

## 2013-11-23 LAB — POCT URINALYSIS DIPSTICK
BILIRUBIN UA: NEGATIVE
Glucose, UA: NEGATIVE
Ketones, UA: NEGATIVE
LEUKOCYTES UA: NEGATIVE
Nitrite, UA: NEGATIVE
Protein, UA: NEGATIVE
Spec Grav, UA: 1.02
Urobilinogen, UA: 0.2
pH, UA: 6.5

## 2013-11-23 MED ORDER — SULFAMETHOXAZOLE-TMP DS 800-160 MG PO TABS
1.0000 | ORAL_TABLET | Freq: Two times a day (BID) | ORAL | Status: DC
Start: 2013-11-23 — End: 2014-01-09

## 2013-11-23 MED ORDER — DICLOFENAC SODIUM 1 % TD GEL: TRANSDERMAL | Status: DC

## 2013-11-23 NOTE — Progress Notes (Signed)
Pre visit review using our clinic review tool, if applicable. No additional management support is needed unless otherwise documented below in the visit note. 

## 2013-11-23 NOTE — Assessment & Plan Note (Signed)
xrays show tricompartmental oa.  Steroid inj helpful, still some mild pain with exercise. Voltaren gel TID

## 2013-11-23 NOTE — Assessment & Plan Note (Signed)
L jaw pain X 4 d. New partial. Pain Likely related to poor fit. Nml exam. Discuss w/dentist

## 2013-11-23 NOTE — Progress Notes (Signed)
Subjective:     [name] is a 58 y.o. female who presents with c/o L sided jaw pain since she got new partial plate. Pain radiates into ear & along side of neck. She also c/o L sided flank pain & malodorous urine. She denies fever, nausea, abd pain, dysuria, frequency. Oxycodone did not help. She mentions knee pain is better after steroid inj, but persistent when exercises.  The following portions of the patient's history were reviewed and updated as appropriate: allergies, current medications, past medical history, past social history, past surgical history and problem list.  Review of Systems Pertinent items are noted in HPI.    Objective:    BP 164/85  Pulse 98  Temp(Src) 97.5 F (36.4 C) (Temporal)  Ht 5\' 2"  (1.575 m)  Wt 193 lb (87.544 kg)  BMI 35.29 kg/m2  SpO2 97% BP 164/85  Pulse 98  Temp(Src) 97.5 F (36.4 C) (Temporal)  Ht 5\' 2"  (1.575 m)  Wt 193 lb (87.544 kg)  BMI 35.29 kg/m2  SpO2 97% General appearance: alert, cooperative, appears stated age and no distress Head: Normocephalic, without obvious abnormality, atraumatic Eyes: negative findings: lids and lashes normal and conjunctivae and sclerae normal Ears: normal TM's and external ear canals both ears Nose: Nares normal. Septum midline. Mucosa normal. No drainage or sinus tenderness., pale mucosa Throat: lips, mucosa, and tongue normal; teeth and gums normal Neck: no adenopathy, supple, symmetrical, trachea midline, thyroid not enlarged, symmetric, no tenderness/mass/nodules and mild swelling L lower jaw when compared to R side.Diffusely tender at TMJ, & along sternocleidomastoid. Back: L CVA tenderness Lymph nodes: Cervical, supraclavicular, and axillary nodes normal.    Assessment:     1. Malodorous urine   2. Back pain   3. Pain in lower jaw    4 knee oa     Plan:   See problem list for complete A&P F/u PRN

## 2013-11-23 NOTE — Patient Instructions (Addendum)
I think jaw pain is related to new partial plate. Please discuss fit with dentist. Take it out as often as able to see if pain gets better. Consider using occasional anti-inflammatory for pain such as ibuprophen or aleve. Also, you may rub nickel size amount voltaren gel on jaw twice daily. Use voltaren gel on knees as directed. Stretch back daily.  Start antibiotic. Our office will call if we need to change the antibiotic.Sip hydrating fluids (water, juice, colorless soda, decaff tea) every hour to flush kidneys.  Urinary Tract Infection Urinary tract infections (UTIs) can develop anywhere along your urinary tract. Your urinary tract is your body's drainage system for removing wastes and extra water. Your urinary tract includes two kidneys, two ureters, a bladder, and a urethra. Your kidneys are a pair of bean-shaped organs. Each kidney is about the size of your fist. They are located below your ribs, one on each side of your spine. CAUSES Infections are caused by microbes, which are microscopic organisms, including fungi, viruses, and bacteria. These organisms are so small that they can only be seen through a microscope. Bacteria are the microbes that most commonly cause UTIs. SYMPTOMS  Symptoms of UTIs may vary by age and gender of the patient and by the location of the infection. Symptoms in young women typically include a frequent and intense urge to urinate and a painful, burning feeling in the bladder or urethra during urination. Older women and men are more likely to be tired, shaky, and weak and have muscle aches and abdominal pain. A fever may mean the infection is in your kidneys. Other symptoms of a kidney infection include pain in your back or sides below the ribs, nausea, and vomiting. DIAGNOSIS To diagnose a UTI, your caregiver will ask you about your symptoms. Your caregiver also will ask to provide a urine sample. The urine sample will be tested for bacteria and white blood cells. White  blood cells are made by your body to help fight infection. TREATMENT  Typically, UTIs can be treated with medication. Because most UTIs are caused by a bacterial infection, they usually can be treated with the use of antibiotics. The choice of antibiotic and length of treatment depend on your symptoms and the type of bacteria causing your infection. HOME CARE INSTRUCTIONS  If you were prescribed antibiotics, take them exactly as your caregiver instructs you. Finish the medication even if you feel better after you have only taken some of the medication.  Drink enough water and fluids to keep your urine clear or pale yellow.  Avoid caffeine, tea, and carbonated beverages. They tend to irritate your bladder.  Empty your bladder often. Avoid holding urine for long periods of time.  Empty your bladder before and after sexual intercourse.  After a bowel movement, women should cleanse from front to back. Use each tissue only once. SEEK MEDICAL CARE IF:   You have back pain.  You develop a fever.  Your symptoms do not begin to resolve within 3 days. SEEK IMMEDIATE MEDICAL CARE IF:   You have severe back pain or lower abdominal pain.  You develop chills.  You have nausea or vomiting.  You have continued burning or discomfort with urination. MAKE SURE YOU:   Understand these instructions.  Will watch your condition.  Will get help right away if you are not doing well or get worse. Document Released: 04/09/2005 Document Revised: 12/30/2011 Document Reviewed: 08/08/2011 [facility] Patient Information 2014 [place].

## 2013-11-23 NOTE — Assessment & Plan Note (Signed)
[id number], pyelo, kidney stone Daily back stretches Bactrim, sending urine for cx.

## 2013-11-23 NOTE — Assessment & Plan Note (Signed)
Malodorous urine last night. L sided back pain. POC ua- tr blood Urine cx-pending bactrim

## 2013-11-25 ENCOUNTER — Telehealth: Payer: Self-pay | Admitting: Nurse Practitioner

## 2013-11-25 LAB — URINE CULTURE: Colony Count: 9000

## 2013-11-25 NOTE — Telephone Encounter (Signed)
pls call pt: Advise No UTI 

## 2013-11-25 NOTE — Telephone Encounter (Signed)
Left detailed message on pt's vm. Per pt's DPR. 

## 2013-12-12 ENCOUNTER — Ambulatory Visit: Payer: BC Managed Care – PPO | Admitting: Family Medicine

## 2013-12-14 ENCOUNTER — Telehealth: Payer: Self-pay

## 2013-12-14 NOTE — Telephone Encounter (Signed)
PA authorization done on Voltaren 1% gel. Sent to [employer]. Awaiting response.

## 2014-01-02 ENCOUNTER — Ambulatory Visit: Payer: BC Managed Care – PPO | Admitting: Family Medicine

## 2014-01-09 ENCOUNTER — Encounter: Payer: Self-pay | Admitting: Family Medicine

## 2014-01-09 ENCOUNTER — Ambulatory Visit (INDEPENDENT_AMBULATORY_CARE_PROVIDER_SITE_OTHER): Payer: BC Managed Care – PPO | Admitting: Family Medicine

## 2014-01-09 VITALS — BP 133/79 | HR 76 | Temp 97.1°F | Resp 18 | Ht 62.0 in | Wt 197.0 lb

## 2014-01-09 DIAGNOSIS — M170 Bilateral primary osteoarthritis of knee: Secondary | ICD-10-CM

## 2014-01-09 DIAGNOSIS — M1710 Unilateral primary osteoarthritis, unspecified knee: Secondary | ICD-10-CM

## 2014-01-09 DIAGNOSIS — E782 Mixed hyperlipidemia: Secondary | ICD-10-CM

## 2014-01-09 DIAGNOSIS — I10 Essential (primary) hypertension: Secondary | ICD-10-CM

## 2014-01-09 MED ORDER — OXYCODONE HCL 5 MG PO TABS: ORAL_TABLET | ORAL | Status: DC

## 2014-01-09 MED ORDER — LOSARTAN POTASSIUM 100 MG PO TABS: 100.0000 mg | ORAL_TABLET | Freq: Every day | ORAL | Status: DC

## 2014-01-09 NOTE — Progress Notes (Signed)
OFFICE NOTE  01/09/2014  CC:  Chief Complaint  Patient presents with  . Follow-up     HPI: Patient is a 58 y.o. Caucasian female who is here for 27mo f/u HTN, hyperlipidemia, GERD, fibromyalgia. Doing ok: knee pain/arthritis still problematic and her voltaren gel was denied by insurer.  Pertinent PMH:  Past medical, surgical, social, and family history reviewed and no changes are noted since last office visit.  MEDS:  Outpatient Prescriptions Prior to Visit  Medication Sig Dispense Refill  . atorvastatin (LIPITOR) 40 MG tablet Take 1 tablet (40 mg total) by mouth daily.  90 tablet  1  . cyclobenzaprine (FLEXERIL) 10 MG tablet Take 1 tablet (10 mg total) by mouth 2 (two) times daily as needed. Muscle spasms  60 tablet  3  . diphenhydramine-acetaminophen (TYLENOL PM) 25-500 MG TABS Take 1 tablet by mouth at bedtime as needed.      . DULoxetine (CYMBALTA) 60 MG capsule Take 1 capsule (60 mg total) by mouth daily.  90 capsule  1  . gabapentin (NEURONTIN) 300 MG capsule 2 caps tid  180 capsule  6  . Green Tea, Camillia sinensis, (GREEN TEA PO) Take by mouth daily.      Marland Kitchen losartan (COZAAR) 50 MG tablet Take 1-2 tablets (50-100 mg total) by mouth as needed.  60 tablet  3  . meloxicam (MOBIC) 15 MG tablet 1 tab po qd prn musculoskeletal pain  30 tablet  6  . Multiple Vitamin (MULTIVITAMIN WITH MINERALS) TABS Take 1 tablet by mouth daily.      Marland Kitchen omeprazole (PRILOSEC) 20 MG capsule Take 1 capsule by mouth Daily.      . psyllium (METAMUCIL) 58.6 % powder Take 1 packet by mouth as needed.      . sennosides-docusate sodium (SENOKOT-S) 8.6-50 MG tablet Take 1 tablet by mouth 2 (two) times daily. While taking pain meds to prevent constipation  30 tablet  0  . Wheat Dextrin (BENEFIBER PO) Take by mouth.      . diclofenac sodium (VOLTAREN) 1 % GEL Use quarter size amount on fronts, back, & sides of knee tid  100 g  3  . OVER THE COUNTER MEDICATION Take 2 tablets by mouth daily. RELACORE- natural  weight loss supplement      . oxyCODONE (OXY IR/ROXICODONE) 5 MG immediate release tablet 1-2 tabs po q6h prn pain  30 tablet  0  . sulfamethoxazole-trimethoprim (BACTRIM DS) 800-160 MG per tablet Take 1 tablet by mouth 2 (two) times daily.  6 tablet  0   No facility-administered medications prior to visit.    PE: Blood pressure 133/79, pulse 76, temperature 97.1 F (36.2 C), temperature source Temporal, resp. rate 18, height 5\' 2"  (1.575 m), weight 197 lb (89.359 kg), SpO2 97.00%. Gen: Alert, well appearing.  Patient is oriented to person, place, time, and situation. AFFECT: pleasant, lucid thought and speech. KNEES: mild increased adiposity around knee joints but no effusion, no warmth.  Mild diffuse tenderness in anterior knee regions diffusely in both knees.  No instability.  +crepitus bilat.  IMPRESSION AND PLAN:  1) Bilat knee osteoarthritis: continue mobic qd with food, keep activity level up.  Oxycodone 5mg  q6h prn, #30, --use sparingly. Signs/symptoms to call or return for were reviewed and pt expressed understanding.  2) Hypertension: take 100 mg losartan dose qd  3) Hyperlipidemia, mixed: continue atorv 40mg  qd. Recheck FLP at f/u in 22mo. Continue diet, increase activity.  4) GERD: may continue daily PPI, work on  GER diet.  An After Visit Summary was printed and given to the patient.  FOLLOW UP: 69mo for fasting CPE

## 2014-01-09 NOTE — Progress Notes (Signed)
Pre visit review using our clinic review tool, if applicable. No additional management support is needed unless otherwise documented below in the visit note. 

## 2014-01-10 ENCOUNTER — Telehealth: Payer: Self-pay | Admitting: Family Medicine

## 2014-01-10 NOTE — Telephone Encounter (Signed)
Relevant patient education assigned to patient using Emmi. ° °

## 2014-01-25 ENCOUNTER — Other Ambulatory Visit: Payer: Self-pay | Admitting: Family Medicine

## 2014-01-30 ENCOUNTER — Ambulatory Visit (HOSPITAL_COMMUNITY)
Admission: RE | Admit: 2014-01-30 | Discharge: 2014-01-30 | Disposition: A | Discharge status: Home / Self Care | Payer: BC Managed Care – PPO | Source: Ambulatory Visit | Attending: Urology | Admitting: Urology

## 2014-01-30 ENCOUNTER — Other Ambulatory Visit (HOSPITAL_COMMUNITY): Payer: Self-pay | Admitting: Urology

## 2014-01-30 DIAGNOSIS — N3941 Urge incontinence: Secondary | ICD-10-CM

## 2014-01-30 DIAGNOSIS — R0989 Other specified symptoms and signs involving the circulatory and respiratory systems: Secondary | ICD-10-CM | POA: Insufficient documentation

## 2014-01-30 DIAGNOSIS — R3915 Urgency of urination: Secondary | ICD-10-CM | POA: Insufficient documentation

## 2014-01-30 DIAGNOSIS — I10 Essential (primary) hypertension: Secondary | ICD-10-CM | POA: Insufficient documentation

## 2014-01-30 DIAGNOSIS — J40 Bronchitis, not specified as acute or chronic: Secondary | ICD-10-CM | POA: Insufficient documentation

## 2014-03-17 ENCOUNTER — Telehealth: Payer: Self-pay | Admitting: Family Medicine

## 2014-03-17 MED ORDER — DULOXETINE HCL 60 MG PO CPEP: ORAL_CAPSULE | ORAL | Status: DC

## 2014-03-17 NOTE — Telephone Encounter (Signed)
Patient states that her insurance will expire soon and would like to know if Dr. Anitra Lauth would refill duloxetine so she won't have to pay out of pocket

## 2014-03-17 NOTE — Telephone Encounter (Signed)
Rx sent to pharmacy   

## 2014-04-18 ENCOUNTER — Encounter: Payer: Self-pay | Admitting: Family Medicine

## 2014-04-18 ENCOUNTER — Telehealth: Payer: Self-pay | Admitting: Family Medicine

## 2014-04-18 NOTE — Telephone Encounter (Signed)
Patient aware.  She will contact that office and have them send records.

## 2014-04-18 NOTE — Telephone Encounter (Signed)
No changes in meds recommended based on this information, but I would like to see the CT she is talking about. I see no CT scan in her EPIC chart.

## 2014-04-18 NOTE — Telephone Encounter (Signed)
Please advise 

## 2014-04-18 NOTE — Telephone Encounter (Signed)
Patient received results from Dr. Tammi Klippel office for CT scan. They advised patient that results were okay however there was fluid around her heart. Patient would like to know if she should change her bp meds. She has noticed intermittant swelling of her ankles & feet.

## 2014-04-19 ENCOUNTER — Telehealth: Payer: Self-pay | Admitting: Family Medicine

## 2014-04-19 NOTE — Telephone Encounter (Signed)
Pls note: I saw her CT and the fluid around the heart is inconsequential and absolutely nothing to worry about. Reassure pt.-thx

## 2014-04-19 NOTE — Telephone Encounter (Signed)
Left detailed message on pt's cell.  Okay per DPR. 

## 2014-06-05 ENCOUNTER — Ambulatory Visit: Payer: BC Managed Care – PPO

## 2014-06-05 ENCOUNTER — Telehealth: Payer: Self-pay | Admitting: Family Medicine

## 2014-06-05 ENCOUNTER — Encounter: Payer: Self-pay | Admitting: Family Medicine

## 2014-06-05 ENCOUNTER — Ambulatory Visit (INDEPENDENT_AMBULATORY_CARE_PROVIDER_SITE_OTHER): Payer: BC Managed Care – PPO | Admitting: Family Medicine

## 2014-06-05 DIAGNOSIS — Z23 Encounter for immunization: Secondary | ICD-10-CM

## 2014-06-05 MED ORDER — OXYCODONE HCL 5 MG PO TABS: ORAL_TABLET | ORAL | Status: DC

## 2014-06-05 NOTE — Telephone Encounter (Signed)
Oxycodone rx printed. 

## 2014-06-05 NOTE — Telephone Encounter (Signed)
Pt requesting rf of oxycodone.  Last OV was 01/09/14.  Last RX was 01/09/14.  Please advise.

## 2014-06-05 NOTE — Telephone Encounter (Signed)
Patient having knee pain.

## 2014-06-19 ENCOUNTER — Other Ambulatory Visit: Payer: Self-pay | Admitting: Family Medicine

## 2014-06-19 MED ORDER — ATORVASTATIN CALCIUM 40 MG PO TABS: 40.0000 mg | ORAL_TABLET | Freq: Every day | ORAL | Status: DC

## 2014-06-21 ENCOUNTER — Encounter: Payer: BC Managed Care – PPO | Admitting: Family Medicine

## 2014-06-22 ENCOUNTER — Encounter: Payer: BC Managed Care – PPO | Admitting: Family Medicine

## 2014-06-27 ENCOUNTER — Ambulatory Visit (INDEPENDENT_AMBULATORY_CARE_PROVIDER_SITE_OTHER): Payer: BC Managed Care – PPO | Admitting: Family Medicine

## 2014-06-27 ENCOUNTER — Encounter: Payer: Self-pay | Admitting: Family Medicine

## 2014-06-27 VITALS — BP 94/64 | HR 94 | Temp 97.6°F | Resp 18 | Ht 62.0 in | Wt 197.0 lb

## 2014-06-27 DIAGNOSIS — Z0000 Encounter for general adult medical examination without abnormal findings: Secondary | ICD-10-CM

## 2014-06-27 DIAGNOSIS — Q079 Congenital malformation of nervous system, unspecified: Secondary | ICD-10-CM

## 2014-06-27 DIAGNOSIS — A5219 Other symptomatic neurosyphilis: Secondary | ICD-10-CM

## 2014-06-27 DIAGNOSIS — M1712 Unilateral primary osteoarthritis, left knee: Secondary | ICD-10-CM

## 2014-06-27 DIAGNOSIS — Q078 Other specified congenital malformations of nervous system: Secondary | ICD-10-CM

## 2014-06-27 DIAGNOSIS — I952 Hypotension due to drugs: Secondary | ICD-10-CM

## 2014-06-27 LAB — CBC WITH DIFFERENTIAL/PLATELET
Basophils Absolute: 0.1 10*3/uL (ref 0.0–0.1)
Basophils Relative: 0.8 % (ref 0.0–3.0)
EOS PCT: 5.4 % — AB (ref 0.0–5.0)
Eosinophils Absolute: 0.3 10*3/uL (ref 0.0–0.7)
HEMATOCRIT: 38.9 % (ref 36.0–46.0)
Hemoglobin: 12.7 g/dL (ref 12.0–15.0)
LYMPHS ABS: 2.2 10*3/uL (ref 0.7–4.0)
LYMPHS PCT: 35.5 % (ref 12.0–46.0)
MCHC: 32.6 g/dL (ref 30.0–36.0)
MCV: 94.2 fl (ref 78.0–100.0)
MONOS PCT: 5.4 % (ref 3.0–12.0)
Monocytes Absolute: 0.3 10*3/uL (ref 0.1–1.0)
NEUTROS PCT: 52.9 % (ref 43.0–77.0)
Neutro Abs: 3.3 10*3/uL (ref 1.4–7.7)
Platelets: 273 10*3/uL (ref 150.0–400.0)
RBC: 4.13 Mil/uL (ref 3.87–5.11)
RDW: 13.9 % (ref 11.5–15.5)
WBC: 6.2 10*3/uL (ref 4.0–10.5)

## 2014-06-27 LAB — TSH: TSH: 1.28 u[IU]/mL (ref 0.35–4.50)

## 2014-06-27 MED ORDER — DULOXETINE HCL 30 MG PO CPEP: 30.0000 mg | ORAL_CAPSULE | Freq: Every day | ORAL | Status: DC

## 2014-06-27 MED ORDER — LOSARTAN POTASSIUM 100 MG PO TABS: ORAL_TABLET | ORAL | Status: DC

## 2014-06-27 NOTE — Progress Notes (Signed)
Office Note 06/27/2014  CC:  Chief Complaint  Patient presents with  . Annual Exam    fasting    HPI:  [name] is a 58 y.o. White female who is here for annual CPE. She has been exercising quite a bit, knee hurting lately b/c of this.  Asks about possibly getting steroid injection again today in left knee. Cold sx's lately--about 3 days--nasal congestion, PND, not so much coughing.  No fevers.  No ST.  She feels like she has some odd colors in visual fields briefly upon awakening and she says this is coming from the cymbalta--happens only intermittently.  Asks if we can cut back on cymbalta dose.    Past Medical History  Diagnosis Date  . Overweight(278.02)   . History of bronchitis     childhood, wood stove and coal heat  . History of galactorrhea 02/05/2011  . Knee pain, bilateral 02/05/2011  . Seasonal allergic rhinitis   . GERD (gastroesophageal reflux disease)   . Heart murmur   . Hyperlipidemia, mixed   . 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 Tresa Moore)  . Hypertension   . Arthritis     back  . Hemorrhoids   . Fibromyalgia   . Chronic renal insufficiency, stage III (moderate)   . Solitary left kidney 2013    Aquired: right nephrectomy for renal cancer    Past Surgical History  Procedure Laterality Date  . Cesarean section  1983  . Tubal ligation    . Tonsillectomy    . Robot assisted laparoscopic nephrectomy  07/12/2012    Procedure: ROBOTIC ASSISTED LAPAROSCOPIC NEPHRECTOMY;  Surgeon: Alexis Frock, MD;  Location: [facility];  Service: Urology;  Laterality: Right;  . Colonoscopy  04/2013    Normal.  Repeat 2019 ([facility]    Family History  Problem Relation Age of Onset  . Thyroid disease Mother   . Heart disease Mother     CHF, heart murmur, PAD  . Kidney disease Mother     RAS  . Other Mother 40    bowel obstruction,surgery  this year  . Colon cancer Father 47  . Cancer Sister     cervical, breast  . Thyroid disease Sister   . Heart disease Brother   . Thyroid disease Maternal Grandfather   . Cancer Maternal Grandfather   . Diabetes Paternal Grandmother   . Cancer Paternal Grandmother     breast  . Heart disease Paternal Grandmother   . Heart disease Paternal Grandfather   . Diabetes Paternal Grandfather   . Hypertension Paternal Grandfather   . Thyroid disease Sister   . Diabetes Sister   . Diabetes Sister   . Hypertension Sister   . Cancer Paternal Aunt     breast cancer  . Heart disease Maternal Grandmother   . Thyroid disease Maternal Grandmother   . Cancer Sister     kidney  . Asthma Daughter   . Allergies Daughter   . Lung disease Son     recurrent bronchitis  . Lung disease Daughter     recurrent bronchitis    History   Social History  . Marital Status: Married    Spouse Name: Jerrye Beavers    Number of Children: 3  . Years of Education: N/A   Occupational History  . Not on file.   Social History Main Topics  . Smoking status: Former Smoker --  2.00 packs/day for 5 years    Types: Cigarettes    Quit date: 07/14/1981  . Smokeless tobacco: Never Used  . Alcohol Use: No  . Drug Use: No  . Sexual Activity: Yes   Other Topics Concern  . Not on file   Social History Narrative   Married, 3 children.   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   No tobacco, alcohol, or drugs.  No exercise.    Outpatient Prescriptions Prior to Visit  Medication Sig Dispense Refill  . atorvastatin (LIPITOR) 40 MG tablet Take 1 tablet (40 mg total) by mouth daily. 90 tablet 1  . cyclobenzaprine (FLEXERIL) 10 MG tablet Take 1 tablet (10 mg total) by mouth 2 (two) times daily as needed. Muscle spasms 60 tablet 3  . diphenhydramine-acetaminophen (TYLENOL PM) 25-500 MG TABS Take 1 tablet by mouth at bedtime as needed.    . gabapentin (NEURONTIN) 300 MG capsule 2 caps tid 180 capsule 6  . meloxicam (MOBIC)  15 MG tablet 1 tab po qd prn musculoskeletal pain 30 tablet 6  . Multiple Vitamin (MULTIVITAMIN WITH MINERALS) TABS Take 1 tablet by mouth daily.    Marland Kitchen omeprazole (PRILOSEC) 20 MG capsule Take 1 capsule by mouth Daily.    Marland Kitchen oxyCODONE (OXY IR/ROXICODONE) 5 MG immediate release tablet 1-2 tabs po q6h prn pain 30 tablet 0  . psyllium (METAMUCIL) 58.6 % powder Take 1 packet by mouth as needed.    . sennosides-docusate sodium (SENOKOT-S) 8.6-50 MG tablet Take 1 tablet by mouth 2 (two) times daily. While taking pain meds to prevent constipation 30 tablet 0  . Wheat Dextrin (BENEFIBER PO) Take by mouth.    . DULoxetine (CYMBALTA) 60 MG capsule TAKE 1 CAPSULE (60 MG TOTAL) BY MOUTH DAILY. 90 capsule 1  . losartan (COZAAR) 100 MG tablet Take 1 tablet (100 mg total) by mouth daily. 30 tablet 12  . Green Tea, Camillia sinensis, (GREEN TEA PO) Take by mouth daily.    Marland Kitchen OVER THE COUNTER MEDICATION Take 2 tablets by mouth daily. RELACORE- natural weight loss supplement     No facility-administered medications prior to visit.    Allergies  Allergen Reactions  . Codeine Nausea Only  . Dilaudid [Hydromorphone Hcl] Nausea And Vomiting    ROS Review of Systems  Constitutional: Negative for fever, chills, appetite change and fatigue.  HENT: Negative for congestion, dental problem, ear pain and sore throat.   Eyes: Negative for discharge, redness and visual disturbance.  Respiratory: Negative for cough, chest tightness, shortness of breath and wheezing.   Cardiovascular: Negative for chest pain, palpitations and leg swelling.  Gastrointestinal: Negative for nausea, vomiting, abdominal pain, diarrhea and blood in stool.  Genitourinary: Negative for dysuria, urgency, frequency, hematuria, flank pain and difficulty urinating.  Musculoskeletal: Negative for myalgias, back pain, joint swelling, arthralgias and neck stiffness.  Skin: Negative for pallor and rash.  Neurological: Negative for dizziness, speech  difficulty, weakness and headaches.  Hematological: Negative for adenopathy. Does not bruise/bleed easily.  Psychiatric/Behavioral: Negative for confusion and sleep disturbance. The patient is not nervous/anxious.     PE; Blood pressure 94/64, pulse 94, temperature 97.6 F (36.4 C), temperature source Temporal, resp. rate 18, height 5\' 2"  (1.575 m), weight 197 lb (89.359 kg), SpO2 98 %. repeat bp R arm with manual cuff was 104/72. Gen: Alert, well appearing.  Patient is oriented to person, place, time, and situation. AFFECT: pleasant, lucid thought and speech. ENT: Ears: EACs clear, normal epithelium.  TMs  with good light reflex and landmarks bilaterally.  Eyes: no injection, icteris, swelling, or exudate.  EOMI, Right pupil is 3 mm, round and minimally reactive to light.  Left pupil is 2 mm and reacts normally to light (shrinks to 1 mm size) Nose: no drainage or turbinate edema/swelling.  No injection or focal lesion.  Mouth: lips without lesion/swelling.  Oral mucosa pink and moist.  Dentition intact and without obvious caries or gingival swelling.  Oropharynx without erythema, exudate, or swelling.  Neck: supple/nontender.  No LAD, mass, or TM.  Carotid pulses 2+ bilaterally, without bruits. CV: RRR, no m/r/g.   LUNGS: CTA bilat, nonlabored resps, good aeration in all lung fields. ABD: soft, NT, ND, BS normal.  No hepatospenomegaly or mass.  No bruits. EXT: no clubbing, cyanosis, or edema.  Musculoskeletal: no joint swelling, erythema, warmth, or tenderness.  ROM of all joints intact.  Left knee without warmth, erythema, or effusion.   Mild anterior tenderness to palpation.  No instability.  Mild crepitus with flexion/extension of left knee. Skin - no sores or suspicious lesions or rashes or color changes   Pertinent labs:  None today RECENT:  Lab Results  Component Value Date   WBC 10.6* 10/04/2013   HGB 12.7 10/04/2013   HCT 38.7 10/04/2013   MCV 95.0 10/04/2013   PLT 294.0  10/04/2013   Lab Results  Component Value Date   CHOL 217* 10/04/2013   HDL 100.80 10/04/2013   LDLCALC 80 10/04/2013   LDLDIRECT 177.1 03/09/2013   TRIG 181.0* 10/04/2013   CHOLHDL 2 10/04/2013     Chemistry      Component Value Date/Time   NA 139 10/04/2013 0843   K 4.2 10/04/2013 0843   CL 100 10/04/2013 0843   CO2 32 10/04/2013 0843   BUN 22 10/04/2013 0843   CREATININE 1.3* 10/04/2013 0843   CREATININE 1.03 05/26/2013 1447      Component Value Date/Time   CALCIUM 9.4 10/04/2013 0843   ALKPHOS 85 10/04/2013 0843   AST 18 10/04/2013 0843   ALT 26 10/04/2013 0843   BILITOT 0.6 10/04/2013 0843     Lab Results  Component Value Date   TSH 1.37 10/04/2013     ASSESSMENT AND PLAN:   Hypotension due to drugs She has hx of HTN, treated currently with 100 mg losartan. Will drop dose down to 1/2 of 100 mg tab qd.  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 Also, the right pupil reacts minimally to light but seems to constrict appropriately with accomodation. Will ask ophthalmologist to see her for further evaluation.  Health maintenance examination Reviewed age and gender appropriate health maintenance issues (prudent diet, regular exercise, health risks of tobacco and excessive alcohol, use of seatbelts, fire alarms in home, use of sunscreen).  Also reviewed age and gender appropriate health screening as well as vaccine recommendations. Fasting HP labs today. She asked that her cymbalta dose be decreased from 60mg  qd to 30mg  qd to see if the odd lights in visual fields upon awakening is possibly due to this med, so I did this. Vaccines all UTD.  She gets appropriate GYN screening through her GYN MD--scheduled for early next year.  Osteoarthritis, knee I recommended she rest her left knee x 1 wk (just do usual activities only--no exercising).  Hold off on steroid injection at this time.  An After Visit Summary was printed and given to the patient.  FOLLOW  UP:  Return in about 6 months (around 12/27/2014)  for routine chronic illness f/u.

## 2014-06-27 NOTE — Assessment & Plan Note (Signed)
She has hx of HTN, treated currently with 100 mg losartan. Will drop dose down to 1/2 of 100 mg tab qd.

## 2014-06-27 NOTE — Progress Notes (Signed)
Pre visit review using our clinic review tool, if applicable. No additional management support is needed unless otherwise documented below in the visit note. 

## 2014-06-27 NOTE — Assessment & Plan Note (Signed)
Right pupil larger than left. Also, the right pupil reacts minimally to light but seems to constrict appropriately with accomodation. Will ask ophthalmologist to see her for further evaluation.

## 2014-06-27 NOTE — Assessment & Plan Note (Signed)
Reviewed age and gender appropriate health maintenance issues (prudent diet, regular exercise, health risks of tobacco and excessive alcohol, use of seatbelts, fire alarms in home, use of sunscreen).  Also reviewed age and gender appropriate health screening as well as vaccine recommendations. Fasting HP labs today. She asked that her cymbalta dose be decreased from 60mg  qd to 30mg  qd to see if the odd lights in visual fields upon awakening is possibly due to this med, so I did this. Vaccines all UTD.  She gets appropriate GYN screening through her GYN MD--scheduled for early next year.

## 2014-06-27 NOTE — Assessment & Plan Note (Signed)
I recommended she rest her left knee x 1 wk (just do usual activities only--no exercising).  Hold off on steroid injection at this time.

## 2014-06-28 LAB — COMPREHENSIVE METABOLIC PANEL
ALT: 28 U/L (ref 0–35)
AST: 27 U/L (ref 0–37)
Albumin: 4.2 g/dL (ref 3.5–5.2)
Alkaline Phosphatase: 96 U/L (ref 39–117)
BUN: 18 mg/dL (ref 6–23)
CO2: 25 meq/L (ref 19–32)
Calcium: 9.5 mg/dL (ref 8.4–10.5)
Chloride: 104 mEq/L (ref 96–112)
Creatinine, Ser: 1.1 mg/dL (ref 0.4–1.2)
GFR: 52.84 mL/min — AB (ref >60.00)
Glucose, Bld: 105 mg/dL — ABNORMAL HIGH (ref 70–99)
Potassium: 4.4 mEq/L (ref 3.5–5.1)
SODIUM: 137 meq/L (ref 135–145)
TOTAL PROTEIN: 6.9 g/dL (ref 6.0–8.3)
Total Bilirubin: 0.7 mg/dL (ref 0.2–1.2)

## 2014-06-28 LAB — LDL CHOLESTEROL, DIRECT: Direct LDL: 128.9 mg/dL

## 2014-06-28 LAB — LIPID PANEL
Cholesterol: 226 mg/dL — ABNORMAL HIGH (ref 0–200)
HDL: 66.1 mg/dL (ref >39.00)
NonHDL: 159.9
TRIGLYCERIDES: 306 mg/dL — AB (ref 0.0–149.0)
Total CHOL/HDL Ratio: 3
VLDL: 61.2 mg/dL — AB (ref 0.0–40.0)

## 2014-06-30 ENCOUNTER — Other Ambulatory Visit: Payer: Self-pay | Admitting: Family Medicine

## 2014-06-30 MED ORDER — ATORVASTATIN CALCIUM 80 MG PO TABS: 80.0000 mg | ORAL_TABLET | Freq: Every day | ORAL | Status: DC

## 2014-07-02 ENCOUNTER — Other Ambulatory Visit: Payer: Self-pay | Admitting: Family Medicine

## 2014-07-03 ENCOUNTER — Other Ambulatory Visit: Payer: Self-pay | Admitting: Family Medicine

## 2014-07-03 ENCOUNTER — Encounter: Payer: BC Managed Care – PPO | Admitting: Family Medicine

## 2014-07-03 MED ORDER — DULOXETINE HCL 30 MG PO CPEP: 30.0000 mg | ORAL_CAPSULE | Freq: Two times a day (BID) | ORAL | Status: DC

## 2014-07-13 ENCOUNTER — Telehealth: Payer: Self-pay | Admitting: Family Medicine

## 2014-07-13 MED ORDER — GABAPENTIN 300 MG PO CAPS: ORAL_CAPSULE | ORAL | Status: DC

## 2014-07-13 NOTE — Telephone Encounter (Signed)
Gabapentin eRx'd per pt request.

## 2014-07-13 NOTE — Telephone Encounter (Signed)
Pt requesting rf of gabapentin.  Last OV was 12/15.  Last Rx was 03/09/13 x 6 rfs. Please advise rf.

## 2014-07-14 DIAGNOSIS — R7301 Impaired fasting glucose: Secondary | ICD-10-CM

## 2014-07-14 HISTORY — DX: Impaired fasting glucose: R73.01

## 2014-08-21 ENCOUNTER — Other Ambulatory Visit: Payer: BC Managed Care – PPO

## 2014-08-30 ENCOUNTER — Other Ambulatory Visit (INDEPENDENT_AMBULATORY_CARE_PROVIDER_SITE_OTHER): Payer: Self-pay

## 2014-08-30 ENCOUNTER — Telehealth: Payer: Self-pay | Admitting: Family Medicine

## 2014-08-30 DIAGNOSIS — E785 Hyperlipidemia, unspecified: Secondary | ICD-10-CM

## 2014-08-30 LAB — ALT: ALT: 22 U/L (ref 0–35)

## 2014-08-30 LAB — AST: AST: 18 U/L (ref 0–37)

## 2014-08-30 NOTE — Telephone Encounter (Signed)
Patient went to Dr. Girtha Rm at [facility]. 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 She said he wants to talk to you about it. He has not done anything for her yet.

## 2014-10-04 ENCOUNTER — Other Ambulatory Visit: Payer: Self-pay | Admitting: Family Medicine

## 2014-10-04 NOTE — Telephone Encounter (Signed)
Oxycodone 10 MG 1-2 every 4-6 hours as needed. Please contact patient when ready for pu

## 2014-10-04 NOTE — Telephone Encounter (Signed)
Refill request for Oxycodone Last filled by MD on- 06/05/14 #30 x0 Last Appt: 06/27/2014 Next Appt: 12/27/2014 Please advise refill?

## 2014-10-05 MED ORDER — OXYCODONE HCL 5 MG PO TABS: ORAL_TABLET | ORAL | Status: DC

## 2014-10-16 ENCOUNTER — Other Ambulatory Visit: Payer: Self-pay

## 2014-10-16 MED ORDER — CYCLOBENZAPRINE HCL 10 MG PO TABS: ORAL_TABLET | ORAL | Status: DC

## 2014-10-16 NOTE — Telephone Encounter (Signed)
Please Advise Refill Request? Refill request for- Cyclobenzaprine 10 mg  Last filled by MD on - 01/14/13 Last Appt - 06/27/14        Next Appt - 12/27/14 Pharmacy- [facility]

## 2014-11-15 ENCOUNTER — Telehealth: Payer: Self-pay | Admitting: Family Medicine

## 2014-11-15 MED ORDER — OXYCODONE HCL 5 MG PO TABS: ORAL_TABLET | ORAL | Status: DC

## 2014-11-15 NOTE — Telephone Encounter (Signed)
Pt. is requesting a refill on her oxycodone. She is having knee pain and neck pain.

## 2014-11-15 NOTE — Telephone Encounter (Signed)
Patient called & said her husband Merril Isakson will be picking up Rx.

## 2014-11-15 NOTE — Telephone Encounter (Signed)
Oxycodone rx printed. 

## 2014-11-15 NOTE — Telephone Encounter (Signed)
Pt requesting refill for oxycodone. Please review and advised. Thanks.

## 2014-11-16 NOTE — Telephone Encounter (Signed)
LMOM advising pt Rx is ready for p/u.

## 2014-12-27 ENCOUNTER — Encounter: Payer: Self-pay | Admitting: Family Medicine

## 2014-12-27 ENCOUNTER — Ambulatory Visit (INDEPENDENT_AMBULATORY_CARE_PROVIDER_SITE_OTHER): Payer: 59 | Admitting: Family Medicine

## 2014-12-27 VITALS — BP 131/81 | HR 76 | Temp 97.9°F | Resp 16 | Wt 199.0 lb

## 2014-12-27 DIAGNOSIS — N183 Chronic kidney disease, stage 3 (moderate): Secondary | ICD-10-CM | POA: Diagnosis not present

## 2014-12-27 DIAGNOSIS — I10 Essential (primary) hypertension: Secondary | ICD-10-CM | POA: Diagnosis not present

## 2014-12-27 DIAGNOSIS — Z23 Encounter for immunization: Secondary | ICD-10-CM

## 2014-12-27 DIAGNOSIS — Z905 Acquired absence of kidney: Secondary | ICD-10-CM | POA: Diagnosis not present

## 2014-12-27 DIAGNOSIS — E782 Mixed hyperlipidemia: Secondary | ICD-10-CM

## 2014-12-27 DIAGNOSIS — Z131 Encounter for screening for diabetes mellitus: Secondary | ICD-10-CM | POA: Diagnosis not present

## 2014-12-27 LAB — BASIC METABOLIC PANEL
BUN: 19 mg/dL (ref 6–23)
CHLORIDE: 105 meq/L (ref 96–112)
CO2: 28 meq/L (ref 19–32)
CREATININE: 1.17 mg/dL (ref 0.40–1.20)
Calcium: 9.1 mg/dL (ref 8.4–10.5)
GFR: 50.67 mL/min — ABNORMAL LOW (ref >60.00)
Glucose, Bld: 105 mg/dL — ABNORMAL HIGH (ref 70–99)
POTASSIUM: 4.4 meq/L (ref 3.5–5.1)
Sodium: 139 mEq/L (ref 135–145)

## 2014-12-27 LAB — HEMOGLOBIN A1C: Hgb A1c MFr Bld: 5.7 % (ref 4.6–6.5)

## 2014-12-27 MED ORDER — OXYCODONE HCL 5 MG PO TABS: ORAL_TABLET | ORAL | Status: DC

## 2014-12-27 MED ORDER — ZOSTER VACCINE LIVE 19400 UNT/0.65ML ~~LOC~~ SOLR: 0.6500 mL | Freq: Once | SUBCUTANEOUS | Status: DC

## 2014-12-27 MED ORDER — MELOXICAM 15 MG PO TABS: ORAL_TABLET | ORAL | Status: DC

## 2014-12-27 NOTE — Progress Notes (Signed)
OFFICE NOTE  12/27/2014  CC:  Chief Complaint  Patient presents with  . Follow-up    6 month f/u. Pt is fasting.     HPI: Patient is a 59 y.o. Caucasian female who is here for 6 mo f/u HTN, hyperlipidemia, fibromyalgia, CRI stage III with solitary left kidney.  She is very concerned about her family history of DM 2: 2 sisters, one parent.  Wants HbA1c, has never had an elevated glucose.  She is fasting today.  Lately having neck pain and right hand>L hand numbness intermittently, esp hs.  Occ pain down both arms.  Seeing chiropracter currently and getting better.  Cymbalta and neurontin helping for her fibromyalgia.    Started working out yesterday.    Home bp checks normal.   Pertinent PMH:  Past medical, surgical, social, and family history reviewed and no changes are noted since last office visit.  MEDS:  Outpatient Prescriptions Prior to Visit  Medication Sig Dispense Refill  . atorvastatin (LIPITOR) 80 MG tablet Take 1 tablet (80 mg total) by mouth daily. 30 tablet 3  . cyclobenzaprine (FLEXERIL) 10 MG tablet 1 tab po bid prn 60 tablet 3  . diphenhydramine-acetaminophen (TYLENOL PM) 25-500 MG TABS Take 1 tablet by mouth at bedtime as needed.    . DULoxetine (CYMBALTA) 30 MG capsule Take 1 capsule (30 mg total) by mouth 2 (two) times daily. 60 capsule 6  . gabapentin (NEURONTIN) 300 MG capsule 2 caps tid 180 capsule 11  . losartan (COZAAR) 100 MG tablet 1/2 tab po qd 15 tablet 6  . meloxicam (MOBIC) 15 MG tablet 1 tab po qd prn musculoskeletal pain 30 tablet 6  . Multiple Vitamin (MULTIVITAMIN WITH MINERALS) TABS Take 1 tablet by mouth daily.    Marland Kitchen omeprazole (PRILOSEC) 20 MG capsule Take 1 capsule by mouth Daily.    Marland Kitchen oxyCODONE (OXY IR/ROXICODONE) 5 MG immediate release tablet 1-2 tabs po q6h prn pain 30 tablet 0  . psyllium (METAMUCIL) 58.6 % powder Take 1 packet by mouth as needed.    . sennosides-docusate sodium (SENOKOT-S) 8.6-50 MG tablet Take 1 tablet by mouth 2  (two) times daily. While taking pain meds to prevent constipation 30 tablet 0  . Wheat Dextrin (BENEFIBER PO) Take by mouth.    Nyoka Cowden Tea, Camillia sinensis, (GREEN TEA PO) Take by mouth daily.    Marland Kitchen OVER THE COUNTER MEDICATION Take 2 tablets by mouth daily. RELACORE- natural weight loss supplement     No facility-administered medications prior to visit.    PE: Blood pressure 131/81, pulse 76, temperature 97.9 F (36.6 C), temperature source Oral, resp. rate 16, weight 199 lb (90.266 kg), SpO2 95 %. Gen: Alert, well appearing.  Patient is oriented to person, place, time, and situation. CV: RRR, no m/r/g.   LUNGS: CTA bilat, nonlabored resps, good aeration in all lung fields. EXT: no clubbing, cyanosis, or edema.    Lab Results  Component Value Date   TSH 1.28 06/27/2014   Lab Results  Component Value Date   WBC 6.2 06/27/2014   HGB 12.7 06/27/2014   HCT 38.9 06/27/2014   MCV 94.2 06/27/2014   PLT 273.0 06/27/2014   Lab Results  Component Value Date   CREATININE 1.1 06/27/2014   BUN 18 06/27/2014   NA 137 06/27/2014   K 4.4 06/27/2014   CL 104 06/27/2014   CO2 25 06/27/2014   Lab Results  Component Value Date   ALT 22 08/30/2014   AST 18  08/30/2014   ALKPHOS 96 06/27/2014   BILITOT 0.7 06/27/2014   Lab Results  Component Value Date   CHOL 226* 06/27/2014   Lab Results  Component Value Date   HDL 66.10 06/27/2014   Lab Results  Component Value Date   LDLCALC 80 10/04/2013   Lab Results  Component Value Date   TRIG 306.0* 06/27/2014   Lab Results  Component Value Date   CHOLHDL 3 06/27/2014    IMPRESSION AND PLAN:  1) Diabetes screening: I notified pt that insurer may not cover A1c as screening test since she has never had an elevated fasting glucose but she said she would pay out of pocket if necessary. HbA1c and BMET today.  2) CRI, stage III with solitary kidney.  Trying to avoid nephrotoxic meds but she gets great benefit from meloxicam prn for  her musculoskeletal complaints, so we'll continue this and monitor renal function closely. BMET today.  3) Fibromyalgia: continue cymbalta and neurontin and meloxicam.  Also oxycodone 5mg , 1-2q6h prn, #30 (rx given today).   4) HTN: The current medical regimen is effective;  continue present plan and medications. Lytes,cr today.  5) Preventative health care: pneumovax 23 IM today (CRI stage III). Rx for zostavax today.  An After Visit Summary was printed and given to the patient.  FOLLOW UP: 6 mo for CPE

## 2014-12-27 NOTE — Progress Notes (Signed)
Pre visit review using our clinic review tool, if applicable. No additional management support is needed unless otherwise documented below in the visit note. 

## 2015-01-10 ENCOUNTER — Other Ambulatory Visit: Payer: Self-pay | Admitting: *Deleted

## 2015-01-10 MED ORDER — ATORVASTATIN CALCIUM 80 MG PO TABS
80.0000 mg | ORAL_TABLET | Freq: Every day | ORAL | Status: DC
Start: 2015-01-10 — End: 2015-07-10

## 2015-01-10 NOTE — Telephone Encounter (Signed)
RF request for atorvastatin.  LOV: 07/04/15 Next ov: 12/27/14 Last written: 06/30/14 #30 w/ 3RF

## 2015-01-24 ENCOUNTER — Other Ambulatory Visit: Payer: Self-pay | Admitting: Obstetrics and Gynecology

## 2015-01-25 LAB — CYTOLOGY - PAP

## 2015-01-29 ENCOUNTER — Other Ambulatory Visit: Payer: Self-pay | Admitting: *Deleted

## 2015-01-29 MED ORDER — DULOXETINE HCL 30 MG PO CPEP: 30.0000 mg | ORAL_CAPSULE | Freq: Two times a day (BID) | ORAL | Status: DC

## 2015-01-29 NOTE — Telephone Encounter (Signed)
RF request for duloxetine. LOV: 12/27/14 Next ov: 07/04/15 Last written: 07/03/14 #60 w/ 6RF

## 2015-01-31 ENCOUNTER — Other Ambulatory Visit: Payer: Self-pay | Admitting: *Deleted

## 2015-01-31 MED ORDER — DULOXETINE HCL 60 MG PO CPEP: 60.0000 mg | ORAL_CAPSULE | Freq: Every day | ORAL | Status: DC

## 2015-01-31 NOTE — Telephone Encounter (Signed)
Fax from [facility] stating pts insurance will not pay for Dulosetine 30mg  BID. Per Dr. Anitra Lauth okay to send in Rx for 60mg  take once daily. Left detailed message on pts cell vm, okay per DPR.

## 2015-02-12 ENCOUNTER — Telehealth: Payer: Self-pay | Admitting: Family Medicine

## 2015-02-12 MED ORDER — OXYCODONE HCL 5 MG PO TABS: ORAL_TABLET | ORAL | Status: DC

## 2015-02-12 NOTE — Telephone Encounter (Signed)
RF request for oxycodone LOV: 12/27/14 Next ov:  07/04/15 Last written: 12/27/14 #30 w/ 0RF Please advise. Thanks.

## 2015-02-12 NOTE — Telephone Encounter (Signed)
Pt is requesting a rx for oxycodone.

## 2015-02-12 NOTE — Telephone Encounter (Signed)
Oxycodone rx printed per pt request.

## 2015-02-13 NOTE — Telephone Encounter (Signed)
Pt advised Rx ready for p/u.

## 2015-03-21 ENCOUNTER — Other Ambulatory Visit: Payer: Self-pay | Admitting: Family Medicine

## 2015-03-21 MED ORDER — OXYCODONE HCL 5 MG PO TABS: ORAL_TABLET | ORAL | Status: DC

## 2015-03-21 NOTE — Telephone Encounter (Signed)
Pt is requesting a refill on her  Oxycodone. She is also requesting the Lazartin to be the old dosage,where she takes 1-2 pills as she needs them. The pills she is currently taking are to hard for her to swallow. She needs the oblong pills/dh

## 2015-03-21 NOTE — Telephone Encounter (Signed)
Please advise about losartan. Thanks.

## 2015-03-21 NOTE — Telephone Encounter (Signed)
RF request for oxycodone LOV: 12/27/14 Next ov: 07/04/15 Last written: 02/12/15 #30 w/ 0YF  Also see message below about losartan.  Please advise.Thanks.

## 2015-03-22 ENCOUNTER — Other Ambulatory Visit: Payer: Self-pay | Admitting: Family Medicine

## 2015-03-22 MED ORDER — LOSARTAN POTASSIUM 50 MG PO TABS: ORAL_TABLET | ORAL | Status: DC

## 2015-03-22 NOTE — Telephone Encounter (Signed)
OK, I sent in rx for losartan 50mg , 1-2 tabs po qd.

## 2015-03-22 NOTE — Telephone Encounter (Signed)
Left detailed message on cell vm, okay per DPR.  

## 2015-03-23 NOTE — Telephone Encounter (Signed)
Received fax from pharmacy stating that pts insurance will not cover the 50mg  losartan twice a day. Per Dr. Anitra Lauth pt will need to continue taking the 100mg  losartan and can cut them in half to help with swallowing them. Pt advised and voiced understanding but seemed displeased with this.

## 2015-04-04 ENCOUNTER — Ambulatory Visit (HOSPITAL_COMMUNITY)
Admission: RE | Admit: 2015-04-04 | Discharge: 2015-04-04 | Disposition: A | Discharge status: Home / Self Care | Payer: BLUE CROSS/BLUE SHIELD | Source: Ambulatory Visit | Attending: Urology | Admitting: Urology

## 2015-04-04 ENCOUNTER — Other Ambulatory Visit: Payer: Self-pay | Admitting: Urology

## 2015-04-04 DIAGNOSIS — C649 Malignant neoplasm of unspecified kidney, except renal pelvis: Secondary | ICD-10-CM | POA: Insufficient documentation

## 2015-04-23 ENCOUNTER — Encounter: Payer: Self-pay | Admitting: Family Medicine

## 2015-04-23 ENCOUNTER — Ambulatory Visit (INDEPENDENT_AMBULATORY_CARE_PROVIDER_SITE_OTHER): Payer: BLUE CROSS/BLUE SHIELD | Admitting: Family Medicine

## 2015-04-23 VITALS — BP 113/72 | HR 83 | Temp 97.7°F | Resp 16 | Ht 62.0 in | Wt 182.0 lb

## 2015-04-23 DIAGNOSIS — K529 Noninfective gastroenteritis and colitis, unspecified: Secondary | ICD-10-CM

## 2015-04-23 DIAGNOSIS — E860 Dehydration: Secondary | ICD-10-CM | POA: Diagnosis not present

## 2015-04-23 MED ORDER — PROMETHAZINE HCL 12.5 MG PO TABS: ORAL_TABLET | ORAL | Status: DC

## 2015-04-23 NOTE — Progress Notes (Signed)
Pre visit review using our clinic review tool, if applicable. No additional management support is needed unless otherwise documented below in the visit note. 

## 2015-04-23 NOTE — Progress Notes (Signed)
OFFICE NOTE  04/23/2015  CC:  Chief Complaint  Patient presents with  . Diarrhea  . Headache  . Sore Throat   HPI: Patient is a 59 y.o. Caucasian female who is here for illness that started 6 d/a with watery diarrhea, fatigue/malaise, crampy abd pain, subjective fever.  The frequency of BMs and amount of fluid loss in BM is lessening over time.  Nausea but no vomiting.  Poor intake of solids. Decent intake of water/clear liquids.  ST off and on, some L ear pain on/off, some upper resp congestion and mild cough, +HA. Sick contacts at work+.  Taking imodium AD and says it was no help.  Pertinent PMH:  Past medical, surgical, social, and family history reviewed and no changes are noted since last office visit.  MEDS:  Outpatient Prescriptions Prior to Visit  Medication Sig Dispense Refill  . atorvastatin (LIPITOR) 80 MG tablet Take 1 tablet (80 mg total) by mouth daily. 30 tablet 3  . cyclobenzaprine (FLEXERIL) 10 MG tablet 1 tab po bid prn 60 tablet 3  . diphenhydramine-acetaminophen (TYLENOL PM) 25-500 MG TABS Take 1 tablet by mouth at bedtime as needed.    . DULoxetine (CYMBALTA) 60 MG capsule Take 1 capsule (60 mg total) by mouth daily. 30 capsule 12  . gabapentin (NEURONTIN) 300 MG capsule 2 caps tid 180 capsule 11  . losartan (COZAAR) 50 MG tablet 1-2 tabs po qd 60 tablet 11  . meloxicam (MOBIC) 15 MG tablet 1 tab po qd prn musculoskeletal pain 30 tablet 6  . Multiple Vitamin (MULTIVITAMIN WITH MINERALS) TABS Take 1 tablet by mouth daily.    Marland Kitchen omeprazole (PRILOSEC) 20 MG capsule Take 1 capsule by mouth Daily.    Marland Kitchen oxyCODONE (OXY IR/ROXICODONE) 5 MG immediate release tablet 1-2 tabs po q6h prn pain 30 tablet 0  . psyllium (METAMUCIL) 58.6 % powder Take 1 packet by mouth as needed.    . sennosides-docusate sodium (SENOKOT-S) 8.6-50 MG tablet Take 1 tablet by mouth 2 (two) times daily. While taking pain meds to prevent constipation 30 tablet 0  . Wheat Dextrin (BENEFIBER PO) Take by  mouth.    Nyoka Cowden Tea, Camillia sinensis, (GREEN TEA PO) Take by mouth daily.    Marland Kitchen zoster vaccine live, PF, (ZOSTAVAX) 23536 UNT/0.65ML injection Inject 19,400 Units into the skin once. (Patient not taking: Reported on 04/23/2015) 1 vial 0   No facility-administered medications prior to visit.    PE: Blood pressure 113/72, pulse 83, temperature 97.7 F (36.5 C), temperature source Oral, resp. rate 16, height 5\' 2"  (1.575 m), weight 182 lb (82.555 kg), SpO2 92 %. Gen: Alert, well appearing.  Patient is oriented to person, place, time, and situation. RWE:RXVQ: no injection, icteris, swelling, or exudate.  EOMI, PERRLA. Mouth: lips without lesion/swelling.  Oral mucosa pink and moist. Oropharynx without erythema, exudate, or swelling.  Neck: mild tenderness in jugulodigastric nodes area but no palpable LAD. CV: RRR, no m/r/g.   LUNGS: CTA bilat, nonlabored resps, good aeration in all lung fields. ABD: soft, rotund/mildly distended in lower abdomen, with normal BS in upper quadrants but decreased BS in lower quadrants.  Diffuse lower abd TTP, without guarding or rebound. EXT: no clubbing, cyanosis, or edema.    IMPRESSION AND PLAN:  Acute (suspect viral) gastroenteritis, with dehydration. Treat nausea with phenergan 12.5mg , 1-2 tabs po q6h prn, push fluids, BRAT diet and advance as tolerated. No anti-diarrheal med at this time since BM frequency and volume of fluid loss has  lessened quite a bit since beginning of illness. Stop atorvastatin, meloxicam, and losartan until nausea and diarrhea have resolved.  An After Visit Summary was printed and given to the patient.  FOLLOW UP: prn

## 2015-04-23 NOTE — Patient Instructions (Signed)
Do not take atorvastatin, meloxicam, or losartan until your nausea and diarrhea have completely resolved.

## 2015-04-26 ENCOUNTER — Other Ambulatory Visit: Payer: Self-pay | Admitting: *Deleted

## 2015-04-26 MED ORDER — OXYCODONE HCL 5 MG PO TABS: ORAL_TABLET | ORAL | Status: DC

## 2015-04-26 NOTE — Telephone Encounter (Signed)
Pt came by office and is requesting refill.   RF request for oxycodone LOV: 04/23/15 Next ov: 07/04/15 Last written: 03/21/15 #30 w/ 0RF  Please advise. Thanks.

## 2015-04-27 NOTE — Telephone Encounter (Signed)
Left message advising pt that Rx is ready for p/u.

## 2015-05-08 ENCOUNTER — Encounter: Payer: Self-pay | Admitting: Family Medicine

## 2015-05-16 ENCOUNTER — Encounter: Payer: Self-pay | Admitting: Family Medicine

## 2015-05-16 ENCOUNTER — Telehealth: Payer: Self-pay | Admitting: Family Medicine

## 2015-05-16 ENCOUNTER — Ambulatory Visit (INDEPENDENT_AMBULATORY_CARE_PROVIDER_SITE_OTHER): Payer: BLUE CROSS/BLUE SHIELD | Admitting: Family Medicine

## 2015-05-16 VITALS — BP 116/80 | HR 72 | Temp 98.5°F | Resp 20 | Wt 186.5 lb

## 2015-05-16 DIAGNOSIS — J0100 Acute maxillary sinusitis, unspecified: Secondary | ICD-10-CM

## 2015-05-16 DIAGNOSIS — J029 Acute pharyngitis, unspecified: Secondary | ICD-10-CM | POA: Diagnosis not present

## 2015-05-16 DIAGNOSIS — J320 Chronic maxillary sinusitis: Secondary | ICD-10-CM | POA: Insufficient documentation

## 2015-05-16 LAB — POCT RAPID STREP A (OFFICE): Rapid Strep A Screen: NEGATIVE

## 2015-05-16 MED ORDER — ZOSTER VACCINE LIVE 19400 UNT/0.65ML ~~LOC~~ SOLR: 0.6500 mL | Freq: Once | SUBCUTANEOUS | Status: DC

## 2015-05-16 MED ORDER — AMOXICILLIN-POT CLAVULANATE 875-125 MG PO TABS: 1.0000 | ORAL_TABLET | Freq: Two times a day (BID) | ORAL | Status: DC

## 2015-05-16 NOTE — Telephone Encounter (Signed)
Left message stating Rx has been faxed to pharmacy.

## 2015-05-16 NOTE — Progress Notes (Signed)
   Subjective:    Patient ID: [name], female    DOB: [birth year], 57 y.o.   MRN: [id number]  HPI  Sore throat: She presents to acute office visit with complaints of sore throat, congestion, sinus pressure, headache, hoarseness, chills, fever that has occurred for over a week. She states last night she became even worse and felt like it was going into her chest and she started to have a productive cough. Patient denies any nausea, vomit, diarrhea, rash or myalgias. Patient takes Zyrtec at night. She has no asthma or COPD history. She does have a history of smoking for 13 years, currently is a nonsmoker.   Past Medical History  Diagnosis Date  . Overweight(278.02)   . History of bronchitis     childhood, wood stove and coal heat  . History of galactorrhea 02/05/2011  . Knee pain, bilateral 02/05/2011  . Seasonal allergic rhinitis   . GERD (gastroesophageal reflux disease)   . Heart murmur   . Hyperlipidemia, mixed   . 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 Wyatt Mage sign of recurrent dz as of this 04/2015 f/u.  Marland Kitchen Hypertension   . Arthritis     back  . Hemorrhoids   . Fibromyalgia   . Chronic renal insufficiency, stage III (moderate)   . Solitary left kidney 2013    Aquired: right nephrectomy for renal cancer   Allergies  Allergen Reactions  . Codeine Nausea Only  . Dilaudid [Hydromorphone Hcl] Nausea And Vomiting   Review of Systems Negative, with the exception of above mentioned in HPI    Objective:   Physical Exam BP 116/80 mmHg  Pulse 72  Temp(Src) 98.5 F (36.9 C) (Oral)  Resp 20  Wt 186 lb 8 oz (84.596 kg)  SpO2 94% Gen: Afebrile. No acute distress. Nontoxic in appearance, well-developed, well-nourished Caucasian female.  HENT: AT. [place]. Bilateral TM visualized and normal in appearance. MMM. Bilateral nares erythema and swelling. Throat with  erythema, no exudates Eyes:Pupils Equal Round Reactive to light, Extraocular movements intact,  Conjunctiva without redness, discharge or icterus. Neck/lymp/endocrine: Supple, bilateral anterior cervical lymphadenopathy, with tenderness to palpation  CV: RRR  Chest: CTAB, no wheeze or crackles Abd: Soft. Round. NTND. BS present  Skin: No rashes, purpura or petechiae.      Assessment & Plan:  1. Sore throat - POCT rapid strep A: Negative  2. Acute maxillary sinusitis, recurrence not specified - Patient encouraged to rest, maintain hydration. Use nasal saline at least 3 times a day. Mucinex if can tolerate. Start Flonase for 2-4 weeks. Augmentin prescription for 10 days. - Work excuse provided for yesterday and today. - amoxicillin-clavulanate (AUGMENTIN) 875-125 MG tablet; Take 1 tablet by mouth 2 (two) times daily.  Dispense: 20 tablet; Refill: 0  Follow-up as needed

## 2015-05-16 NOTE — Patient Instructions (Signed)
Flonase, augmentin, hydrate, rest. Nasal saline, mucinex if can tolerate.   Sinusitis, Adult Sinusitis is redness, soreness, and inflammation of the paranasal sinuses. Paranasal sinuses are air pockets within the bones of your face. They are located beneath your eyes, in the middle of your forehead, and above your eyes. In healthy paranasal sinuses, mucus is able to drain out, and air is able to circulate through them by way of your nose. However, when your paranasal sinuses are inflamed, mucus and air can become trapped. This can allow bacteria and other germs to grow and cause infection. Sinusitis can develop quickly and last only a short time (acute) or continue over a long period (chronic). Sinusitis that lasts for more than 12 weeks is considered chronic. CAUSES Causes of sinusitis include:  Allergies.  Structural abnormalities, such as displacement of the cartilage that separates your nostrils (deviated septum), which can decrease the air flow through your nose and sinuses and affect sinus drainage.  Functional abnormalities, such as when the small hairs (cilia) that line your sinuses and help remove mucus do not work properly or are not present. SIGNS AND SYMPTOMS Symptoms of acute and chronic sinusitis are the same. The primary symptoms are pain and pressure around the affected sinuses. Other symptoms include:  Upper toothache.  Earache.  Headache.  Bad breath.  Decreased sense of smell and taste.  A cough, which worsens when you are lying flat.  Fatigue.  Fever.  Thick drainage from your nose, which often is green and may contain pus (purulent).  Swelling and warmth over the affected sinuses. DIAGNOSIS Your health care provider will perform a physical exam. During your exam, your health care provider may perform any of the following to help determine if you have acute sinusitis or chronic sinusitis:  Look in your nose for signs of abnormal growths in your nostrils  (nasal polyps).  Tap over the affected sinus to check for signs of infection.  View the inside of your sinuses using an imaging device that has a light attached (endoscope). If your health care provider suspects that you have chronic sinusitis, one or more of the following tests may be recommended:  Allergy tests.  Nasal culture. A sample of mucus is taken from your nose, sent to a lab, and screened for bacteria.  Nasal cytology. A sample of mucus is taken from your nose and examined by your health care provider to determine if your sinusitis is related to an allergy. TREATMENT Most cases of acute sinusitis are related to a viral infection and will resolve on their own within 10 days. Sometimes, medicines are prescribed to help relieve symptoms of both acute and chronic sinusitis. These may include pain medicines, decongestants, nasal steroid sprays, or saline sprays. However, for sinusitis related to a bacterial infection, your health care provider will prescribe antibiotic medicines. These are medicines that will help kill the bacteria causing the infection. Rarely, sinusitis is caused by a fungal infection. In these cases, your health care provider will prescribe antifungal medicine. For some cases of chronic sinusitis, surgery is needed. Generally, these are cases in which sinusitis recurs more than 3 times per year, despite other treatments. HOME CARE INSTRUCTIONS  Drink plenty of water. Water helps thin the mucus so your sinuses can drain more easily.  Use a humidifier.  Inhale steam 3-4 times a day (for example, sit in the bathroom with the shower running).  Apply a warm, moist washcloth to your face 3-4 times a day, or as directed  by your health care provider.  Use saline nasal sprays to help moisten and clean your sinuses.  Take medicines only as directed by your health care provider.  If you were prescribed either an antibiotic or antifungal medicine, finish it all even if  you start to feel better. SEEK IMMEDIATE MEDICAL CARE IF:  You have increasing pain or severe headaches.  You have nausea, vomiting, or drowsiness.  You have swelling around your face.  You have vision problems.  You have a stiff neck.  You have difficulty breathing.   This information is not intended to replace advice given to you by your health care provider. Make sure you discuss any questions you have with your health care provider.   Document Released: 06/30/2005 Document Revised: 07/21/2014 Document Reviewed: 07/15/2011 Elsevier Interactive Patient Education [employer].

## 2015-05-16 NOTE — Telephone Encounter (Signed)
Patient has lost her Rx for Zostavax. Please fax to [facility]

## 2015-05-22 ENCOUNTER — Other Ambulatory Visit: Payer: Self-pay | Admitting: *Deleted

## 2015-05-22 MED ORDER — LOSARTAN POTASSIUM 50 MG PO TABS
ORAL_TABLET | ORAL | Status: DC
Start: 2015-05-22 — End: 2016-06-09

## 2015-05-22 NOTE — Telephone Encounter (Signed)
RF request for losartan 90 day supply LOV: 12/27/14 Next ov: 07/04/15 Last written: 03/22/15 #60 w/ 1RF

## 2015-05-23 ENCOUNTER — Other Ambulatory Visit: Payer: Self-pay | Admitting: Family Medicine

## 2015-05-23 ENCOUNTER — Other Ambulatory Visit: Payer: Self-pay | Admitting: *Deleted

## 2015-05-23 MED ORDER — OXYCODONE HCL 5 MG PO TABS: ORAL_TABLET | ORAL | Status: DC

## 2015-05-23 NOTE — Telephone Encounter (Signed)
Pt is requesting a refill on her Oxycodone.

## 2015-05-23 NOTE — Telephone Encounter (Signed)
Opened in Error.

## 2015-05-23 NOTE — Telephone Encounter (Signed)
RF request for oxycodone LOV: 12/27/14 Next ov: 07/04/15 Last written: 04/26/15 #30 w/ 0RF Please advise. Thanks.

## 2015-06-19 ENCOUNTER — Other Ambulatory Visit: Payer: Self-pay | Admitting: Family Medicine

## 2015-06-19 NOTE — Telephone Encounter (Signed)
Pt is requesting a refill on her oxycodone. She has an appt later in December.

## 2015-06-19 NOTE — Telephone Encounter (Signed)
RF request for oxycodone LOV: 12/27/14 Next ov: 07/04/15 Last written: 05/23/15 #30 w/ [id number]

## 2015-06-20 MED ORDER — OXYCODONE HCL 5 MG PO TABS: ORAL_TABLET | ORAL | Status: DC

## 2015-06-20 NOTE — Telephone Encounter (Signed)
Spoke to pt to advise her Rx was ready for p/u. Pt wanted to schedule an apt today to be seen for left arm pain, pain under left breast and reflux symptoms. I spoke to Dr. Anitra Lauth and he stated that pt needs to go to ER for cardiac work up due to symptoms she is having. Pt advised and voiced understanding.

## 2015-07-04 ENCOUNTER — Encounter: Payer: Self-pay | Admitting: Family Medicine

## 2015-07-04 ENCOUNTER — Ambulatory Visit (INDEPENDENT_AMBULATORY_CARE_PROVIDER_SITE_OTHER): Payer: Managed Care, Other (non HMO) | Admitting: Family Medicine

## 2015-07-04 VITALS — BP 95/63 | HR 87 | Temp 98.0°F | Resp 16 | Ht 62.0 in | Wt 187.5 lb

## 2015-07-04 DIAGNOSIS — Z0000 Encounter for general adult medical examination without abnormal findings: Secondary | ICD-10-CM | POA: Diagnosis not present

## 2015-07-04 LAB — CBC WITH DIFFERENTIAL/PLATELET
BASOS ABS: 0 10*3/uL (ref 0.0–0.1)
Basophils Relative: 0 % (ref 0–1)
Eosinophils Absolute: 0.2 10*3/uL (ref 0.0–0.7)
Eosinophils Relative: 3 % (ref 0–5)
HCT: 38.6 % (ref 36.0–46.0)
HEMOGLOBIN: 13 g/dL (ref 12.0–15.0)
LYMPHS ABS: 2.3 10*3/uL (ref 0.7–4.0)
LYMPHS PCT: 33 % (ref 12–46)
MCH: 30.8 pg (ref 26.0–34.0)
MCHC: 33.7 g/dL (ref 30.0–36.0)
MCV: 91.5 fL (ref 78.0–100.0)
MPV: 10.4 fL (ref 8.6–12.4)
Monocytes Absolute: 0.5 10*3/uL (ref 0.1–1.0)
Monocytes Relative: 7 % (ref 3–12)
NEUTROS PCT: 57 % (ref 43–77)
Neutro Abs: 3.9 10*3/uL (ref 1.7–7.7)
Platelets: 292 10*3/uL (ref 150–400)
RBC: 4.22 MIL/uL (ref 3.87–5.11)
RDW: 13.9 % (ref 11.5–15.5)
WBC: 6.9 10*3/uL (ref 4.0–10.5)

## 2015-07-04 NOTE — Progress Notes (Signed)
Office Note 07/04/2015  CC:  Chief Complaint  Patient presents with  . Annual Exam    Pt is fasting.     HPI:  [name] is a 59 y.o. White female who is here for annual health maintenance exam.   Past Medical History  Diagnosis Date  . Overweight(278.02)   . History of bronchitis     childhood, wood stove and coal heat  . History of galactorrhea 02/05/2011  . Knee pain, bilateral 02/05/2011  . Seasonal allergic rhinitis   . GERD (gastroesophageal reflux disease)   . Heart murmur   . Hyperlipidemia, mixed   . 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 Wyatt Mage sign of recurrent dz as of this 04/2015 f/u.  Marland Kitchen Hypertension   . Arthritis     back  . Hemorrhoids   . Fibromyalgia   . Chronic renal insufficiency, stage III (moderate)   . Solitary left kidney 2013    Aquired: right nephrectomy for renal cancer    Past Surgical History  Procedure Laterality Date  . Cesarean section  1983  . Tubal ligation    . Tonsillectomy    . Robot assisted laparoscopic nephrectomy  07/12/2012    Procedure: ROBOTIC ASSISTED LAPAROSCOPIC NEPHRECTOMY;  Surgeon: Alexis Frock, MD;  Location: [facility];  Service: Urology;  Laterality: Right;  . Colonoscopy  04/2013    Normal.  Repeat 2019 ([facility] of [facility]    Family History  Problem Relation Age of Onset  . Thyroid disease Mother   . Heart disease Mother     CHF, heart murmur, PAD  . Kidney disease Mother     RAS  . Other Mother 65    bowel obstruction,surgery this year  . Colon cancer Father 7  . Cancer Sister     cervical, breast  . Thyroid disease Sister   . Heart disease Brother   . Thyroid disease Maternal Grandfather   . Cancer Maternal Grandfather   . Diabetes Paternal Grandmother   . Cancer Paternal Grandmother     breast  . Heart disease Paternal Grandmother   . Heart disease Paternal Grandfather   .  Diabetes Paternal Grandfather   . Hypertension Paternal Grandfather   . Thyroid disease Sister   . Diabetes Sister   . Diabetes Sister   . Hypertension Sister   . Cancer Paternal Aunt     breast cancer  . Heart disease Maternal Grandmother   . Thyroid disease Maternal Grandmother   . Cancer Sister     kidney  . Asthma Daughter   . Allergies Daughter   . Lung disease Son     recurrent bronchitis  . Lung disease Daughter     recurrent bronchitis    Social History   Social History  . Marital Status: Married    Spouse Name: Jerrye Beavers  . Number of Children: 3  . Years of Education: N/A   Occupational History  . Not on file.   Social History Main Topics  . Smoking status: Former Smoker -- 2.00 packs/day for 5 years    Types: Cigarettes    Quit date: 07/14/1981  . Smokeless tobacco: Never Used  . Alcohol Use: No  . Drug Use: No  . Sexual Activity: Yes   Other Topics Concern  . Not on file   Social History Narrative   Married, 3 children.   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   No tobacco, alcohol, or drugs.  No exercise.    Outpatient Prescriptions Prior to Visit  Medication Sig Dispense Refill  . alendronate (FOSAMAX) 70 MG tablet   2  . atorvastatin (LIPITOR) 80 MG tablet Take 1 tablet (80 mg total) by mouth daily. 30 tablet 3  . cyclobenzaprine (FLEXERIL) 10 MG tablet 1 tab po bid prn 60 tablet 3  . diphenhydramine-acetaminophen (TYLENOL PM) 25-500 MG TABS Take 1 tablet by mouth at bedtime as needed.    . DULoxetine (CYMBALTA) 60 MG capsule Take 1 capsule (60 mg total) by mouth daily. 30 capsule 12  . gabapentin (NEURONTIN) 300 MG capsule 2 caps tid 180 capsule 11  . losartan (COZAAR) 50 MG tablet 1-2 tabs po qd 180 tablet 3  . meloxicam (MOBIC) 15 MG tablet 1 tab po qd prn musculoskeletal pain 30 tablet 6  . Multiple Vitamin (MULTIVITAMIN WITH MINERALS) TABS Take 1 tablet by mouth daily.    Marland Kitchen omeprazole (PRILOSEC) 20 MG capsule Take 1 capsule by mouth Daily.     Marland Kitchen oxyCODONE (OXY IR/ROXICODONE) 5 MG immediate release tablet 1-2 tabs po q6h prn pain 30 tablet 0  . promethazine (PHENERGAN) 12.5 MG tablet 1-2 tabs po q6h prn nausea 30 tablet 0  . psyllium (METAMUCIL) 58.6 % powder Take 1 packet by mouth as needed.    . sennosides-docusate sodium (SENOKOT-S) 8.6-50 MG tablet Take 1 tablet by mouth 2 (two) times daily. While taking pain meds to prevent constipation 30 tablet 0  . Wheat Dextrin (BENEFIBER PO) Take by mouth.    Marland Kitchen amoxicillin-clavulanate (AUGMENTIN) 875-125 MG tablet Take 1 tablet by mouth 2 (two) times daily. (Patient not taking: Reported on 07/04/2015) 20 tablet 0  . Green Tea, Camillia sinensis, (GREEN TEA PO) Take by mouth daily. Reported on 07/04/2015    . zoster vaccine live, PF, (ZOSTAVAX) 91478 UNT/0.65ML injection Inject 19,400 Units into the skin once. (Patient not taking: Reported on 07/04/2015) 1 each 0   No facility-administered medications prior to visit.    Allergies  Allergen Reactions  . Codeine Nausea Only  . Dilaudid [Hydromorphone Hcl] Nausea And Vomiting    ROS Review of Systems  Constitutional: Negative for fever, chills, appetite change and fatigue.  HENT: Negative for congestion, dental problem, ear pain and sore throat.   Eyes: Negative for discharge, redness and visual disturbance.  Respiratory: Negative for cough, chest tightness, shortness of breath and wheezing.   Cardiovascular: Negative for chest pain, palpitations and leg swelling.  Gastrointestinal: Negative for nausea, vomiting, abdominal pain, diarrhea and blood in stool.  Genitourinary: Negative for dysuria, urgency, frequency, hematuria, flank pain and difficulty urinating.  Musculoskeletal: Negative for myalgias, back pain, joint swelling, arthralgias and neck stiffness.  Skin: Negative for pallor and rash.  Neurological: Negative for dizziness, speech difficulty, weakness and headaches.  Hematological: Negative for adenopathy. Does not  bruise/bleed easily.  Psychiatric/Behavioral: Negative for confusion and sleep disturbance. The patient is not nervous/anxious.     PE; Blood pressure 95/63, pulse 87, temperature 98 F (36.7 C), temperature source Oral, resp. rate 16, height 5\' 2"  (1.575 m), weight 187 lb 8 oz (85.049 kg), SpO2 96 %.  Pt examined with Jacklynn Ganong, CMA, as chaperone. Gen: Alert, well appearing.  Patient is oriented to person, place, time, and situation. AFFECT: pleasant, lucid thought and speech. ENT: Ears: EACs clear, normal epithelium.  TMs with good light reflex and landmarks bilaterally.  Eyes: no injection, icteris, swelling, or exudate.  EOMI,  PERRLA. Nose: no drainage or turbinate edema/swelling.  No injection or focal lesion.  Mouth: lips without lesion/swelling.  Oral mucosa pink and moist.  Dentition intact and without obvious caries or gingival swelling.  Oropharynx without erythema, exudate, or swelling.  Neck: supple/nontender.  No LAD, mass, or TM.  Carotid pulses 2+ bilaterally, without bruits. CV: RRR, no m/r/g.   LUNGS: CTA bilat, nonlabored resps, good aeration in all lung fields. ABD: soft, NT, ND, BS normal.  No hepatospenomegaly or mass.  No bruits. EXT: no clubbing, cyanosis, or edema.  Musculoskeletal: no joint swelling, erythema, warmth, or tenderness.  ROM of all joints intact. Skin - no sores or suspicious lesions or rashes or color changes   Pertinent labs:  Lab Results  Component Value Date   TSH 1.28 06/27/2014   Lab Results  Component Value Date   WBC 6.2 06/27/2014   HGB 12.7 06/27/2014   HCT 38.9 06/27/2014   MCV 94.2 06/27/2014   PLT 273.0 06/27/2014   Lab Results  Component Value Date   CREATININE 1.17 12/27/2014   BUN 19 12/27/2014   NA 139 12/27/2014   K 4.4 12/27/2014   CL 105 12/27/2014   CO2 28 12/27/2014   Lab Results  Component Value Date   ALT 22 08/30/2014   AST 18 08/30/2014   ALKPHOS 96 06/27/2014   BILITOT 0.7 06/27/2014   Lab Results   Component Value Date   CHOL 226* 06/27/2014   Lab Results  Component Value Date   HDL 66.10 06/27/2014   Lab Results  Component Value Date   LDLCALC 80 10/04/2013   Lab Results  Component Value Date   TRIG 306.0* 06/27/2014   Lab Results  Component Value Date   CHOLHDL 3 06/27/2014   Lab Results  Component Value Date   HGBA1C 5.7 12/27/2014    ASSESSMENT AND PLAN:   Health maintenance exam:  Reviewed age and gender appropriate health maintenance issues (prudent diet, regular exercise, health risks of tobacco and excessive alcohol, use of seatbelts, fire alarms in home, use of sunscreen).  Also reviewed age and gender appropriate health screening as well as vaccine recommendations. Vaccines UTD. Fasting HP labs drawn today. Cerv and breast ca screening done via her GYN, Dr. Matthew Saras. Colon ca screening utd: Repeat 2019 (5 yr recall due to [place] of Colon cancer).  An After Visit Summary was printed and given to the patient.  FOLLOW UP:  Return in about 6 months (around 01/02/2016) for routine chronic illness f/u.

## 2015-07-04 NOTE — Progress Notes (Signed)
Pre visit review using our clinic review tool, if applicable. No additional management support is needed unless otherwise documented below in the visit note. 

## 2015-07-05 LAB — COMPREHENSIVE METABOLIC PANEL
ALT: 20 U/L (ref 6–29)
AST: 17 U/L (ref 10–35)
Albumin: 4.3 g/dL (ref 3.6–5.1)
Alkaline Phosphatase: 100 U/L (ref 33–130)
BUN: 12 mg/dL (ref 7–25)
CALCIUM: 9.3 mg/dL (ref 8.6–10.4)
CHLORIDE: 99 mmol/L (ref 98–110)
CO2: 27 mmol/L (ref 20–31)
Creat: 0.99 mg/dL (ref 0.50–1.05)
Glucose, Bld: 110 mg/dL — ABNORMAL HIGH (ref 65–99)
POTASSIUM: 4.5 mmol/L (ref 3.5–5.3)
Sodium: 135 mmol/L (ref 135–146)
TOTAL PROTEIN: 6.7 g/dL (ref 6.1–8.1)
Total Bilirubin: 0.5 mg/dL (ref 0.2–1.2)

## 2015-07-05 LAB — LIPID PANEL
CHOL/HDL RATIO: 2.8 ratio (ref <=5.0)
CHOLESTEROL: 190 mg/dL (ref 125–200)
HDL: 68 mg/dL (ref >=46)
LDL CALC: 77 mg/dL (ref <130)
TRIGLYCERIDES: 225 mg/dL — AB (ref <150)
VLDL: 45 mg/dL — AB (ref <30)

## 2015-07-05 LAB — TSH: TSH: 1.3 u[IU]/mL (ref 0.350–4.500)

## 2015-07-10 ENCOUNTER — Other Ambulatory Visit: Payer: Self-pay | Admitting: *Deleted

## 2015-07-10 MED ORDER — ATORVASTATIN CALCIUM 80 MG PO TABS: 80.0000 mg | ORAL_TABLET | Freq: Every day | ORAL | Status: DC

## 2015-07-10 NOTE — Telephone Encounter (Signed)
RF request for atrovastatin LOV: 07/04/15 Next ov: 01/02/16 Last written: 01/10/15 #30 w/ 3RF

## 2015-07-17 ENCOUNTER — Telehealth: Payer: Self-pay | Admitting: Certified Registered Nurse Anesthetist

## 2015-07-17 NOTE — Telephone Encounter (Signed)
Rx request for Oxycodone. Last seen 07/04/15.  Last refilled 06/20/15 #30.

## 2015-07-18 ENCOUNTER — Other Ambulatory Visit: Payer: Self-pay | Admitting: Family Medicine

## 2015-07-18 MED ORDER — OXYCODONE HCL 5 MG PO TABS: ORAL_TABLET | ORAL | Status: DC

## 2015-07-18 NOTE — Telephone Encounter (Signed)
Left message stating that Rx is ready for p/u.

## 2015-07-18 NOTE — Telephone Encounter (Signed)
Oxycodone rx printed. 

## 2015-07-18 NOTE — Telephone Encounter (Signed)
Please advise. Thanks.  

## 2015-07-30 ENCOUNTER — Other Ambulatory Visit: Payer: Self-pay | Admitting: *Deleted

## 2015-07-30 MED ORDER — GABAPENTIN 300 MG PO CAPS: ORAL_CAPSULE | ORAL | Status: DC

## 2015-07-30 NOTE — Telephone Encounter (Signed)
RF request for gabapentin LOV: 07/04/15 Next ov: 01/02/16 Last written: 07/13/14 #180 w/ 11RF

## 2015-08-15 ENCOUNTER — Other Ambulatory Visit: Payer: Self-pay | Admitting: *Deleted

## 2015-08-15 MED ORDER — OXYCODONE HCL 5 MG PO TABS: ORAL_TABLET | ORAL | Status: DC

## 2015-08-15 NOTE — Telephone Encounter (Signed)
RF request for oxycodone LOV: 07/04/15 Next ov: 01/02/16 Last written: 07/18/15 #30 w/ 0RF  Please advise. Thanks.

## 2015-08-16 NOTE — Telephone Encounter (Signed)
Rx put up front for p/u. Pt advised and voiced understanding.   

## 2015-08-21 ENCOUNTER — Ambulatory Visit: Payer: Managed Care, Other (non HMO) | Admitting: Family Medicine

## 2015-09-10 ENCOUNTER — Other Ambulatory Visit: Payer: Self-pay | Admitting: *Deleted

## 2015-09-10 MED ORDER — OXYCODONE HCL 5 MG PO TABS: ORAL_TABLET | ORAL | Status: DC

## 2015-09-10 NOTE — Telephone Encounter (Signed)
RF request for oxycodone LOV: 07/04/15 Next ov: 01/02/16 Last written: 08/15/15 #30 w/ [id number]  Please advise. Thanks.

## 2015-09-11 NOTE — Telephone Encounter (Signed)
Rx put up front for p/u. Pt advised and voiced understanding.   

## 2015-10-05 ENCOUNTER — Other Ambulatory Visit: Payer: Self-pay | Admitting: Family Medicine

## 2015-10-05 MED ORDER — OXYCODONE HCL 5 MG PO TABS: ORAL_TABLET | ORAL | Status: DC

## 2015-10-05 NOTE — Telephone Encounter (Signed)
Rx put up front for p/u. Left message on cell vm advising Rx is ready for p/u.

## 2015-10-05 NOTE — Telephone Encounter (Signed)
RF request for oxycodone LOV: 07/04/15 Next ov: 01/02/16 Last written: 09/10/15 #30 w/ [id number]  Please advise. Thanks.

## 2015-10-05 NOTE — Telephone Encounter (Signed)
Pt calling for a refill on her oxycodone.

## 2015-10-18 ENCOUNTER — Other Ambulatory Visit: Payer: Self-pay | Admitting: *Deleted

## 2015-10-18 MED ORDER — OMEPRAZOLE 20 MG PO CPDR: 20.0000 mg | DELAYED_RELEASE_CAPSULE | Freq: Every day | ORAL | Status: DC | PRN

## 2015-10-18 NOTE — Telephone Encounter (Signed)
RF request for omeprazole LOV: 07/04/15 Next ov: 01/02/16 Last written: unknown

## 2015-10-29 ENCOUNTER — Other Ambulatory Visit: Payer: Self-pay | Admitting: *Deleted

## 2015-10-29 NOTE — Telephone Encounter (Signed)
Pt LMOM on 10/29/15 at 2:13pm requesting refill.  RF request for oxycodone LOV: 07/04/15 Next ov: 01/02/16 Last written: 10/05/15 #30 w/ [id number]  Please advise. Thanks.

## 2015-10-30 MED ORDER — OXYCODONE HCL 5 MG PO TABS: ORAL_TABLET | ORAL | Status: DC

## 2015-10-30 NOTE — Telephone Encounter (Signed)
Rx put up front for p/u. Left message advising Rx is ready for p/u.

## 2015-11-15 ENCOUNTER — Telehealth: Payer: Self-pay | Admitting: Family Medicine

## 2015-11-15 NOTE — Telephone Encounter (Signed)
Patient left VM. She is in [place] & she forgot her medicine. Recording sounds like she is saying Deloxatine. She is asking for Rx to be sent to [facility] in [place].

## 2015-11-15 NOTE — Telephone Encounter (Signed)
Advised pt that [facility] should have Rx on file with refills and that she will have to pay out of pocket for any early refills. Pt voiced understanding.

## 2015-11-19 ENCOUNTER — Telehealth: Payer: Self-pay | Admitting: Family Medicine

## 2015-11-19 NOTE — Telephone Encounter (Signed)
Oxycodone  °

## 2015-11-20 ENCOUNTER — Other Ambulatory Visit: Payer: Self-pay | Admitting: Family Medicine

## 2015-11-20 MED ORDER — OXYCODONE HCL 5 MG PO TABS: ORAL_TABLET | ORAL | Status: DC

## 2015-11-20 NOTE — Telephone Encounter (Signed)
I'll print rx, but tell her I noticed that the last 2 RF requests for this med have come a week earlier than her usual. Is her fibromyalgia pain escalating or is there some other pain at this time?

## 2015-11-20 NOTE — Telephone Encounter (Signed)
Spoke to patient. Informed med refill request fwd to Dr. Anitra Lauth for review. Patient verbalized understanding.

## 2015-11-21 NOTE — Telephone Encounter (Signed)
Noted  

## 2015-11-21 NOTE — Telephone Encounter (Signed)
Patient states she has been coping with the knee pain but the fibromyalgia pain does seem to increase on the weekends because she is more active she take 2 tabs rather than one as directed. Pt aware she has an upcoming appt in June. Advised patient Rx ready for pick up at front desk.

## 2015-11-26 ENCOUNTER — Ambulatory Visit (INDEPENDENT_AMBULATORY_CARE_PROVIDER_SITE_OTHER): Payer: Managed Care, Other (non HMO) | Admitting: Family Medicine

## 2015-11-26 ENCOUNTER — Ambulatory Visit (HOSPITAL_BASED_OUTPATIENT_CLINIC_OR_DEPARTMENT_OTHER)
Admission: RE | Admit: 2015-11-26 | Discharge: 2015-11-26 | Disposition: A | Discharge status: Home / Self Care | Payer: Managed Care, Other (non HMO) | Source: Ambulatory Visit | Attending: Family Medicine | Admitting: Family Medicine

## 2015-11-26 ENCOUNTER — Encounter: Payer: Self-pay | Admitting: *Deleted

## 2015-11-26 ENCOUNTER — Encounter: Payer: Self-pay | Admitting: Family Medicine

## 2015-11-26 VITALS — BP 138/85 | HR 85 | Temp 97.9°F | Resp 16 | Ht 62.0 in | Wt 199.5 lb

## 2015-11-26 DIAGNOSIS — M5136 Other intervertebral disc degeneration, lumbar region: Secondary | ICD-10-CM | POA: Insufficient documentation

## 2015-11-26 DIAGNOSIS — Z85528 Personal history of other malignant neoplasm of kidney: Secondary | ICD-10-CM

## 2015-11-26 DIAGNOSIS — M545 Low back pain, unspecified: Secondary | ICD-10-CM

## 2015-11-26 DIAGNOSIS — M47896 Other spondylosis, lumbar region: Secondary | ICD-10-CM | POA: Insufficient documentation

## 2015-11-26 DIAGNOSIS — R109 Unspecified abdominal pain: Secondary | ICD-10-CM | POA: Diagnosis not present

## 2015-11-26 DIAGNOSIS — R3129 Other microscopic hematuria: Secondary | ICD-10-CM

## 2015-11-26 LAB — URINALYSIS, ROUTINE W REFLEX MICROSCOPIC
Bilirubin Urine: NEGATIVE
Ketones, ur: NEGATIVE
Leukocytes, UA: NEGATIVE
Nitrite: NEGATIVE
PH: 5.5 (ref 5.0–8.0)
Specific Gravity, Urine: >=1.03 — AB (ref 1.000–1.030)
Urine Glucose: NEGATIVE
Urobilinogen, UA: 0.2 (ref 0.0–1.0)

## 2015-11-26 LAB — POCT URINALYSIS DIPSTICK
BILIRUBIN UA: NEGATIVE
GLUCOSE UA: NEGATIVE
KETONES UA: NEGATIVE
LEUKOCYTES UA: NEGATIVE
Nitrite, UA: NEGATIVE
PH UA: 6
Spec Grav, UA: >=1.03
Urobilinogen, UA: 0.2

## 2015-11-26 NOTE — Progress Notes (Signed)
Pre visit review using our clinic review tool, if applicable. No additional management support is needed unless otherwise documented below in the visit note. 

## 2015-11-26 NOTE — Progress Notes (Signed)
OFFICE VISIT  11/26/2015   CC:  Chief Complaint  Patient presents with  . Back Pain    started this morning   HPI:    Patient is a 60 y.o. Caucasian female who presents for back/flank pain on both sides, onset this morning.  Got out of bed feeling fine, then the pain came on acutely.  The pain focuses in generalized low back region and moves up her back and around her sides some and down L anterolateral thigh lately.  No physical exertion/strain prior to onset of pain.  No tingling or numbness in legs.  The pain feels like a squeezing ache, with sharpness to it too.  Again, she says it really doesn't seem to be focused on one side of the body.   She took tylenol, oxycodone, and a flexeril this morning. No dysuria, gross hematuria, urinary urgency, or frequency.  No fever.  No abd pain.  No n/v.  Past Medical History  Diagnosis Date  . Overweight(278.02)   . History of bronchitis     childhood, wood stove and coal heat  . History of galactorrhea 02/05/2011  . Knee pain, bilateral 02/05/2011  . Seasonal allergic rhinitis   . GERD (gastroesophageal reflux disease)   . Heart murmur   . Hyperlipidemia, mixed   . 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 Wyatt Mage sign of recurrent dz as of this 04/2015 f/u.  Marland Kitchen Hypertension   . Arthritis     back  . Hemorrhoids   . Fibromyalgia   . Chronic renal insufficiency, stage III (moderate)   . Solitary left kidney 2013    Aquired: right nephrectomy for renal cancer    Past Surgical History  Procedure Laterality Date  . Cesarean section  1983  . Tubal ligation    . Tonsillectomy    . Robot assisted laparoscopic nephrectomy  07/12/2012    Procedure: ROBOTIC ASSISTED LAPAROSCOPIC NEPHRECTOMY;  Surgeon: Alexis Frock, MD;  Location: [facility];  Service: Urology;  Laterality: Right;  . Colonoscopy  04/2013    Normal.  Repeat  2019 ([facility]    Outpatient Prescriptions Prior to Visit  Medication Sig Dispense Refill  . alendronate (FOSAMAX) 70 MG tablet Take 70 mg by mouth once a week.   2  . atorvastatin (LIPITOR) 80 MG tablet Take 1 tablet (80 mg total) by mouth daily. 30 tablet 12  . cyclobenzaprine (FLEXERIL) 10 MG tablet 1 tab po bid prn 60 tablet 3  . diphenhydramine-acetaminophen (TYLENOL PM) 25-500 MG TABS Take 1 tablet by mouth at bedtime as needed.    . DULoxetine (CYMBALTA) 60 MG capsule Take 1 capsule (60 mg total) by mouth daily. 30 capsule 12  . gabapentin (NEURONTIN) 300 MG capsule 2 caps tid 180 capsule 6  . losartan (COZAAR) 50 MG tablet 1-2 tabs po qd 180 tablet 3  . meloxicam (MOBIC) 15 MG tablet 1 tab po qd prn musculoskeletal pain 30 tablet 6  . Multiple Vitamin (MULTIVITAMIN WITH MINERALS) TABS Take 1 tablet by mouth daily.    Marland Kitchen omeprazole (PRILOSEC) 20 MG capsule Take 1 capsule (20 mg total) by mouth daily as needed. 30 capsule 11  . oxyCODONE (OXY IR/ROXICODONE) 5 MG immediate release tablet 1-2 tabs po q6h prn pain 30 tablet 0  . promethazine (PHENERGAN) 12.5 MG tablet 1-2 tabs po q6h prn nausea 30 tablet 0  . psyllium (METAMUCIL) 58.6 %  powder Take 1 packet by mouth as needed.    . sennosides-docusate sodium (SENOKOT-S) 8.6-50 MG tablet Take 1 tablet by mouth 2 (two) times daily. While taking pain meds to prevent constipation 30 tablet 0  . Wheat Dextrin (BENEFIBER PO) Take by mouth.     No facility-administered medications prior to visit.    Allergies  Allergen Reactions  . Codeine Nausea Only  . Dilaudid [Hydromorphone Hcl] Nausea And Vomiting    ROS As per HPI  PE: Blood pressure 138/85, pulse 85, temperature 97.9 F (36.6 C), temperature source Oral, resp. rate 16, height 5\' 2"  (1.575 m), weight 199 lb 8 oz (90.493 kg), SpO2 97 %. Gen: wincing in pain, alert, oriented x 4. BACK: forward flexion to only 40 deg, pain with extension of L spine. Rotation and lateral bending  a bit impaired due to signif pain. She has lots of tenderness to just minimal palpation all over lumbar spine and lower thoracic pain diffusely. Both flanks are sensitive to touch.  She is generally sensitive to touch everywhere from mid T spine down---midline and laterally. LE strength 5/5 prox/dist bilat.  LABS:  CC UA with small blood, SG >1.030, trace protein, otherwise normal  IMPRESSION AND PLAN:  Back pain, suspicious more for fibromyalgia flare than acute musculoskeletal low back strain. Encouraged pt to stretch today, stay out of work, take her usual medications (mobic, flexeril, oxycodone) and I will also get an updated low back plain film.   I'll send her urine for UA with reflex microscopy to see if she truly has microscopic hematuria.  If she does, will have to work this up.  An After Visit Summary was printed and given to the patient.  FOLLOW UP: Return if symptoms worsen or fail to improve.  Signed:  Crissie Sickles, MD           11/26/2015

## 2015-12-06 ENCOUNTER — Encounter: Payer: Self-pay | Admitting: Family Medicine

## 2015-12-13 ENCOUNTER — Other Ambulatory Visit: Payer: Self-pay | Admitting: *Deleted

## 2015-12-13 ENCOUNTER — Telehealth: Payer: Self-pay | Admitting: Family Medicine

## 2015-12-13 NOTE — Telephone Encounter (Signed)
Per Dr. Anitra Lauth needs to be a 30 min apt. Pt advised and voiced understanding.  She stated that she will have to call back to schedule.

## 2015-12-13 NOTE — Telephone Encounter (Signed)
I agree, pt needs o/v at her convenience.

## 2015-12-13 NOTE — Telephone Encounter (Signed)
Pt is requesting refill. She stated that she is not due for refill til 12/22/15 but wanted to see if Dr. Anitra Lauth would go ahead and print it since he is going to be on vacation next week. She stated that she will not p/u Rx until 12/21/15. Please advise. Thanks.   RF request for oxycodone LOV: 11/26/15 Next ov: 01/02/16 Last written: 11/20/15 #30 w/ 0RF

## 2015-12-13 NOTE — Telephone Encounter (Signed)
Please call pt to schedule apt. Thanks.

## 2015-12-13 NOTE — Telephone Encounter (Signed)
Patient has been having a tingling sensation in her lower extremities for quite awhile but it has had an increase in severity and frequency. Is also having ha's and feeling weak lately.  Recent blood sugar level was 113.

## 2015-12-14 MED ORDER — OXYCODONE HCL 5 MG PO TABS: ORAL_TABLET | ORAL | Status: DC

## 2015-12-14 NOTE — Telephone Encounter (Signed)
Rx put up front for p/u. Pt advised and voiced understanding.   

## 2016-01-02 ENCOUNTER — Ambulatory Visit: Payer: 59 | Admitting: Family Medicine

## 2016-01-02 ENCOUNTER — Ambulatory Visit (INDEPENDENT_AMBULATORY_CARE_PROVIDER_SITE_OTHER): Payer: Managed Care, Other (non HMO) | Admitting: Family Medicine

## 2016-01-02 ENCOUNTER — Encounter: Payer: Self-pay | Admitting: Family Medicine

## 2016-01-02 VITALS — BP 111/72 | HR 89 | Temp 98.0°F | Resp 16 | Ht 62.0 in | Wt 202.2 lb

## 2016-01-02 DIAGNOSIS — M159 Polyosteoarthritis, unspecified: Secondary | ICD-10-CM

## 2016-01-02 DIAGNOSIS — M797 Fibromyalgia: Secondary | ICD-10-CM

## 2016-01-02 DIAGNOSIS — I10 Essential (primary) hypertension: Secondary | ICD-10-CM

## 2016-01-02 DIAGNOSIS — R7301 Impaired fasting glucose: Secondary | ICD-10-CM

## 2016-01-02 DIAGNOSIS — E782 Mixed hyperlipidemia: Secondary | ICD-10-CM

## 2016-01-02 DIAGNOSIS — M8949 Other hypertrophic osteoarthropathy, multiple sites: Secondary | ICD-10-CM

## 2016-01-02 DIAGNOSIS — N189 Chronic kidney disease, unspecified: Secondary | ICD-10-CM | POA: Diagnosis not present

## 2016-01-02 DIAGNOSIS — N2889 Other specified disorders of kidney and ureter: Secondary | ICD-10-CM | POA: Insufficient documentation

## 2016-01-02 DIAGNOSIS — N183 Chronic kidney disease, stage 3 unspecified: Secondary | ICD-10-CM

## 2016-01-02 DIAGNOSIS — M150 Primary generalized (osteo)arthritis: Secondary | ICD-10-CM

## 2016-01-02 DIAGNOSIS — G2581 Restless legs syndrome: Secondary | ICD-10-CM

## 2016-01-02 DIAGNOSIS — G894 Chronic pain syndrome: Secondary | ICD-10-CM

## 2016-01-02 LAB — BASIC METABOLIC PANEL
BUN: 15 mg/dL (ref 7–25)
CALCIUM: 9.7 mg/dL (ref 8.6–10.4)
CO2: 25 mmol/L (ref 20–31)
CREATININE: 1.09 mg/dL — AB (ref 0.50–1.05)
Chloride: 105 mmol/L (ref 98–110)
GLUCOSE: 96 mg/dL (ref 65–99)
Potassium: 4.7 mmol/L (ref 3.5–5.3)
Sodium: 141 mmol/L (ref 135–146)

## 2016-01-02 LAB — CBC WITH DIFFERENTIAL/PLATELET
BASOS ABS: 0 {cells}/uL (ref 0–200)
Basophils Relative: 0 %
Eosinophils Absolute: 296 cells/uL (ref 15–500)
Eosinophils Relative: 4 %
HEMATOCRIT: 37.7 % (ref 35.0–45.0)
HEMOGLOBIN: 12.7 g/dL (ref 11.7–15.5)
LYMPHS PCT: 33 %
Lymphs Abs: 2442 cells/uL (ref 850–3900)
MCH: 30.6 pg (ref 27.0–33.0)
MCHC: 33.7 g/dL (ref 32.0–36.0)
MCV: 90.8 fL (ref 80.0–100.0)
MONO ABS: 444 {cells}/uL (ref 200–950)
MPV: 10.6 fL (ref 7.5–12.5)
Monocytes Relative: 6 %
NEUTROS PCT: 57 %
Neutro Abs: 4218 cells/uL (ref 1500–7800)
Platelets: 294 10*3/uL (ref 140–400)
RBC: 4.15 MIL/uL (ref 3.80–5.10)
RDW: 13.8 % (ref 11.0–15.0)
WBC: 7.4 10*3/uL (ref 3.8–10.8)

## 2016-01-02 LAB — FERRITIN: FERRITIN: 44 ng/mL (ref 10–232)

## 2016-01-02 MED ORDER — OXYCODONE HCL 5 MG PO TABS: ORAL_TABLET | ORAL | Status: DC
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
## 2016-01-02 NOTE — Progress Notes (Signed)
Pre visit review using our clinic review tool, if applicable. No additional management support is needed unless otherwise documented below in the visit note. 

## 2016-01-03 ENCOUNTER — Encounter: Payer: Self-pay | Admitting: Family Medicine

## 2016-01-03 LAB — HEMOGLOBIN A1C
Hgb A1c MFr Bld: 5.8 % — ABNORMAL HIGH (ref <5.7)
Mean Plasma Glucose: 120 mg/dL

## 2016-01-11 ENCOUNTER — Ambulatory Visit (INDEPENDENT_AMBULATORY_CARE_PROVIDER_SITE_OTHER): Payer: Managed Care, Other (non HMO) | Admitting: Family Medicine

## 2016-01-11 ENCOUNTER — Encounter: Payer: Self-pay | Admitting: Family Medicine

## 2016-01-11 VITALS — BP 131/79 | HR 95 | Temp 97.8°F | Resp 16 | Ht 62.0 in | Wt 199.8 lb

## 2016-01-11 DIAGNOSIS — K5732 Diverticulitis of large intestine without perforation or abscess without bleeding: Secondary | ICD-10-CM | POA: Diagnosis not present

## 2016-01-11 DIAGNOSIS — R202 Paresthesia of skin: Secondary | ICD-10-CM | POA: Diagnosis not present

## 2016-01-11 MED ORDER — SULFAMETHOXAZOLE-TRIMETHOPRIM 800-160 MG PO TABS: 1.0000 | ORAL_TABLET | Freq: Two times a day (BID) | ORAL | Status: DC

## 2016-01-11 MED ORDER — METRONIDAZOLE 500 MG PO TABS
500.0000 mg | ORAL_TABLET | Freq: Three times a day (TID) | ORAL | Status: DC
Start: 2016-01-11 — End: 2016-06-16

## 2016-01-11 NOTE — Progress Notes (Signed)
OFFICE VISIT  01/11/2016   CC:  Chief Complaint  Patient presents with  . Abdominal Pain    LLQ over 1 week  . Tingling    in legs   HPI:    Patient is a 60 y.o. Caucasian female who presents for left lower quadrant abd pain that radiates around her left flank to the left low back.  Started 1 week ago with loose BMs 4-5 per day.  No fever.  Slight nausea but no vomiting.  Has not had this before. The pain waxes and wanes.  Sometimes gets very severe.  Appetite still good.  Eating makes the sx's worse.  Has had tingling in legs for years.  Was only once in a while, now more often.  Mostly in lower legs.  Usually happens when she's on her feet.  No irresistible urge to move her legs/restless legs symptoms.  No pain involved.  The tingling lasts seconds then goes away.    Past Medical History  Diagnosis Date  . Overweight(278.02)   . History of bronchitis     childhood, wood stove and coal heat  . History of galactorrhea 02/05/2011  . Knee pain, bilateral 02/05/2011  . Seasonal allergic rhinitis   . GERD (gastroesophageal reflux disease)   . Heart murmur   . Hyperlipidemia, mixed   . 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 Wyatt Mage sign of recurrent dz as of this 04/2015 f/u.  Marland Kitchen Hypertension   . Lumbar spondylosis   . Hemorrhoids   . Fibromyalgia   . Chronic renal insufficiency, stage III (moderate)     GFR 50's  . Solitary left kidney 2013    Aquired: right nephrectomy for renal cancer  . IFG (impaired fasting glucose) 2016    HbA1c 5.7-5.8.    Past Surgical History  Procedure Laterality Date  . Cesarean section  1983  . Tubal ligation    . Tonsillectomy    . Robot assisted laparoscopic nephrectomy  07/12/2012    Procedure: ROBOTIC ASSISTED LAPAROSCOPIC NEPHRECTOMY;  Surgeon: Alexis Frock, MD;  Location: [facility];  Service: Urology;  Laterality: Right;   . Colonoscopy  04/2013    Normal.  Repeat 2019 ([facility]    Outpatient Prescriptions Prior to Visit  Medication Sig Dispense Refill  . acetaminophen (TYLENOL) 500 MG tablet Take 1,000 mg by mouth every 8 (eight) hours as needed.    Marland Kitchen alendronate (FOSAMAX) 70 MG tablet Take 70 mg by mouth once a week.   2  . atorvastatin (LIPITOR) 80 MG tablet Take 1 tablet (80 mg total) by mouth daily. 30 tablet 12  . cyclobenzaprine (FLEXERIL) 10 MG tablet 1 tab po bid prn 60 tablet 3  . diphenhydramine-acetaminophen (TYLENOL PM) 25-500 MG TABS Take 1 tablet by mouth at bedtime as needed.    . DULoxetine (CYMBALTA) 60 MG capsule Take 1 capsule (60 mg total) by mouth daily. 30 capsule 12  . gabapentin (NEURONTIN) 300 MG capsule 2 caps tid 180 capsule 6  . losartan (COZAAR) 50 MG tablet 1-2 tabs po qd 180 tablet 3  . meloxicam (MOBIC) 15 MG tablet 1 tab po qd prn musculoskeletal pain 30 tablet 6  . Multiple Vitamin (MULTIVITAMIN WITH MINERALS) TABS Take 1 tablet by mouth daily.    Marland Kitchen omeprazole (PRILOSEC) 20 MG capsule Take 1 capsule (20 mg total) by mouth daily as needed. 30 capsule 11  .  OVER THE COUNTER MEDICATION Green Tea Fat Burner and Hydroxycut    . oxyCODONE (OXY IR/ROXICODONE) 5 MG immediate release tablet 1-2 tabs po q6h prn pain 30 tablet 0  . promethazine (PHENERGAN) 12.5 MG tablet 1-2 tabs po q6h prn nausea 30 tablet 0  . psyllium (METAMUCIL) 58.6 % powder Take 1 packet by mouth as needed.    . sennosides-docusate sodium (SENOKOT-S) 8.6-50 MG tablet Take 1 tablet by mouth 2 (two) times daily. While taking pain meds to prevent constipation 30 tablet 0  . Wheat Dextrin (BENEFIBER PO) Take by mouth.     No facility-administered medications prior to visit.    Allergies  Allergen Reactions  . Codeine Nausea Only  . Dilaudid [Hydromorphone Hcl] Nausea And Vomiting    ROS As per HPI  PE: Blood pressure 131/79, pulse 95, temperature 97.8 F (36.6 C), temperature source Oral, resp. rate  16, height 5\' 2"  (1.575 m), weight 199 lb 12 oz (90.606 kg), SpO2 96 %. Gen: Alert, well appearing.  Patient is oriented to person, place, time, and situation. [id number]: no injection, icteris, swelling, or exudate.  EOMI, PERRLA. Mouth: lips without lesion/swelling.  Oral mucosa pink and moist. Oropharynx without erythema, exudate, or swelling.  CV: RRR, no m/r/g.   LUNGS: CTA bilat, nonlabored resps, good aeration in all lung fields. ABD: soft, nondistended, mild TTP in mid epig and RUQ, but more significant TTP in LLQ.  No guarding or rebound.  No mass or HSM or bruit.  No flank or back tenderness.  No suprapubic TTP. Legs: no sensory deficits.  LE strength 5/5 prox and dist bilat.  Patellar and achilles DTRs 2+ bilat.  LABS:  Lab Results  Component Value Date   TSH 1.300 07/04/2015   Lab Results  Component Value Date   WBC 7.4 01/02/2016   HGB 12.7 01/02/2016   HCT 37.7 01/02/2016   MCV 90.8 01/02/2016   PLT 294 01/02/2016   Lab Results  Component Value Date   CREATININE 1.09* 01/02/2016   BUN 15 01/02/2016   NA 141 01/02/2016   K 4.7 01/02/2016   CL 105 01/02/2016   CO2 25 01/02/2016   Lab Results  Component Value Date   ALT 20 07/04/2015   AST 17 07/04/2015   ALKPHOS 100 07/04/2015   BILITOT 0.5 07/04/2015   Lab Results  Component Value Date   CHOL 190 07/04/2015   Lab Results  Component Value Date   HDL 68 07/04/2015   Lab Results  Component Value Date   LDLCALC 77 07/04/2015   Lab Results  Component Value Date   TRIG 225* 07/04/2015   Lab Results  Component Value Date   CHOLHDL 2.8 07/04/2015   IMPRESSION AND PLAN:  1) Diverticulitis suspected: will treat with bactrim DS 1 bid x 10d and metronidazole 500 mg tid x 10d. Liquid diet + bland foods, advance as tolerated.  2) LE paresthesias: no abnormality on exam, no red flags at this time. Watchful waiting approach at this time.  An After Visit Summary was printed and given to the  patient.  FOLLOW UP: Return if symptoms worsen or fail to improve.  Signed:  Crissie Sickles, MD           01/11/2016

## 2016-01-11 NOTE — Progress Notes (Signed)
Pre visit review using our clinic review tool, if applicable. No additional management support is needed unless otherwise documented below in the visit note. 

## 2016-01-16 ENCOUNTER — Other Ambulatory Visit: Payer: Self-pay | Admitting: Family Medicine

## 2016-01-16 NOTE — Telephone Encounter (Signed)
RF request for meloxicam LOV: 01/02/16 Next ov: 07/09/16 Last written: 12/27/14 #30 w/ 6RF  Please advise. Thanks.

## 2016-02-25 ENCOUNTER — Other Ambulatory Visit: Payer: Self-pay | Admitting: *Deleted

## 2016-02-25 MED ORDER — DULOXETINE HCL 60 MG PO CPEP: 60.0000 mg | ORAL_CAPSULE | Freq: Every day | ORAL | 12 refills | Status: DC

## 2016-02-25 NOTE — Telephone Encounter (Signed)
[facility]  RF request for duloxetine LOV: 01/02/16 Next ov: 07/09/16 Last written: 01/31/15 #30 w/ 3RF

## 2016-03-03 ENCOUNTER — Other Ambulatory Visit: Payer: Self-pay | Admitting: *Deleted

## 2016-03-03 MED ORDER — OXYCODONE HCL 5 MG PO TABS: ORAL_TABLET | ORAL | 0 refills | Status: DC

## 2016-03-03 NOTE — Telephone Encounter (Signed)
Pt called requesting refill for oxycodone. She wanted to see if Dr. Anitra Lauth would go ahead an post date a Rx for her to p/u. She stated that she is not due for her next refill til 03/12/16.   RF request for oxycodone LOV: 01/02/16 Next ov: 07/09/16 Last written: 01/02/16 #30 w/ [id number]  Please advise. Thanks.

## 2016-03-03 NOTE — Telephone Encounter (Addendum)
Left message on home vm stating Rx is ready for p/u at our front desk, okay per DPR.

## 2016-03-20 ENCOUNTER — Ambulatory Visit: Payer: Managed Care, Other (non HMO) | Admitting: Family Medicine

## 2016-04-07 ENCOUNTER — Other Ambulatory Visit: Payer: Self-pay | Admitting: Family Medicine

## 2016-04-07 ENCOUNTER — Telehealth: Payer: Self-pay

## 2016-04-07 MED ORDER — OXYCODONE HCL 5 MG PO TABS: ORAL_TABLET | ORAL | 0 refills | Status: DC

## 2016-04-07 NOTE — Telephone Encounter (Signed)
Patient called requesting to go on Lyrica so she can start taking Trintillix which she has samples of at work.  Patient also request refill on Oxycodone.  Last seen 01/05/16, Canceled last appt 03/17/16

## 2016-04-07 NOTE — Telephone Encounter (Signed)
Oxycodone rx printed. Pt needs to make appt to come in and discuss her questions about lyrica and trintellix.-thx

## 2016-04-07 NOTE — Telephone Encounter (Signed)
Patient notified to schedule appointment and prescription is ready to be picked up.

## 2016-04-09 ENCOUNTER — Ambulatory Visit (INDEPENDENT_AMBULATORY_CARE_PROVIDER_SITE_OTHER): Payer: Managed Care, Other (non HMO) | Admitting: Family Medicine

## 2016-04-09 ENCOUNTER — Encounter: Payer: Self-pay | Admitting: Family Medicine

## 2016-04-09 VITALS — BP 132/84 | HR 77 | Temp 97.6°F | Resp 16 | Wt 204.8 lb

## 2016-04-09 DIAGNOSIS — Z23 Encounter for immunization: Secondary | ICD-10-CM

## 2016-04-09 DIAGNOSIS — F32A Depression, unspecified: Secondary | ICD-10-CM

## 2016-04-09 DIAGNOSIS — F418 Other specified anxiety disorders: Secondary | ICD-10-CM

## 2016-04-09 DIAGNOSIS — F329 Major depressive disorder, single episode, unspecified: Secondary | ICD-10-CM

## 2016-04-09 DIAGNOSIS — M797 Fibromyalgia: Secondary | ICD-10-CM | POA: Diagnosis not present

## 2016-04-09 DIAGNOSIS — F419 Anxiety disorder, unspecified: Principal | ICD-10-CM

## 2016-04-09 MED ORDER — VORTIOXETINE HBR 10 MG PO TABS: ORAL_TABLET | ORAL | 3 refills | Status: DC

## 2016-04-09 MED ORDER — DULOXETINE HCL 30 MG PO CPEP: ORAL_CAPSULE | ORAL | 0 refills | Status: DC

## 2016-04-09 NOTE — Progress Notes (Signed)
OFFICE VISIT  04/09/2016  CC:  Chief Complaint  Patient presents with  . Follow-up    Discuss medication change   HPI:    Patient is a 60 y.o. Caucasian female who presents for meds discussion/questions. She is interested in trying trintellix and getting off cymbalta.  She complains of poor focus, excessive anxiety, poor energy level.  Mood is ok but motivation is down.  Fibro pain still bothersome daily. Doesn't feel like cymbalta helpful anymore. Heard a lot about trintellix and has been researching it and wants to try it.  She usually takes 1 gabapentin in daytime, 2-3 after work, and 2-3 again hs.    Past Medical History:  Diagnosis Date  . Chronic renal insufficiency, stage III (moderate)    GFR 50's  . Fibromyalgia   . GERD (gastroesophageal reflux disease)   . Heart murmur   . Hemorrhoids   . History of bronchitis    childhood, wood stove and coal heat  . History of galactorrhea 02/05/2011  . Hyperlipidemia, mixed   . Hypertension   . IFG (impaired fasting glucose) 2016   HbA1c 5.7-5.8.  Marland Kitchen Knee pain, bilateral 02/05/2011  . Lumbar spondylosis   . Overweight(278.02)   . 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 Wyatt Mage sign of recurrent dz as of this 04/2015 f/u.  Marland Kitchen Seasonal allergic rhinitis   . Solitary left kidney 2013   Aquired: right nephrectomy for renal cancer    Past Surgical History:  Procedure Laterality Date  . CESAREAN SECTION  1983  . COLONOSCOPY  04/2013   Normal.  Repeat 2019 ([facility]  . ROBOT ASSISTED LAPAROSCOPIC NEPHRECTOMY  07/12/2012   Procedure: ROBOTIC ASSISTED LAPAROSCOPIC NEPHRECTOMY;  Surgeon: Alexis Frock, MD;  Location: [facility];  Service: Urology;  Laterality: Right;  . TONSILLECTOMY    . TUBAL LIGATION      Outpatient Medications Prior to Visit  Medication Sig Dispense Refill  . acetaminophen (TYLENOL) 500 MG  tablet Take 1,000 mg by mouth every 8 (eight) hours as needed.    Marland Kitchen alendronate (FOSAMAX) 70 MG tablet Take 70 mg by mouth once a week.   2  . atorvastatin (LIPITOR) 80 MG tablet Take 1 tablet (80 mg total) by mouth daily. 30 tablet 12  . cyclobenzaprine (FLEXERIL) 10 MG tablet 1 tab po bid prn 60 tablet 3  . diphenhydramine-acetaminophen (TYLENOL PM) 25-500 MG TABS Take 1 tablet by mouth at bedtime as needed.    . gabapentin (NEURONTIN) 300 MG capsule 2 caps tid 180 capsule 6  . losartan (COZAAR) 50 MG tablet 1-2 tabs po qd 180 tablet 3  . meloxicam (MOBIC) 15 MG tablet TAKE 1 TABLET BY MOUTH EVERY DAY AS NEEDED MUSCULOSKELETAL PAIN 30 tablet 4  . metroNIDAZOLE (FLAGYL) 500 MG tablet Take 1 tablet (500 mg total) by mouth 3 (three) times daily. 30 tablet 0  . Multiple Vitamin (MULTIVITAMIN WITH MINERALS) TABS Take 1 tablet by mouth daily.    Marland Kitchen omeprazole (PRILOSEC) 20 MG capsule Take 1 capsule (20 mg total) by mouth daily as needed. 30 capsule 11  . OVER THE COUNTER MEDICATION Green Tea Fat Burner and Hydroxycut    . oxyCODONE (OXY IR/ROXICODONE) 5 MG immediate release tablet 1-2 tabs po q6h prn pain 30 tablet 0  . promethazine (PHENERGAN) 12.5 MG tablet 1-2 tabs po q6h prn nausea 30 tablet 0  . psyllium (METAMUCIL) 58.6 %  powder Take 1 packet by mouth as needed.    . sennosides-docusate sodium (SENOKOT-S) 8.6-50 MG tablet Take 1 tablet by mouth 2 (two) times daily. While taking pain meds to prevent constipation 30 tablet 0  . Wheat Dextrin (BENEFIBER PO) Take by mouth.    . DULoxetine (CYMBALTA) 60 MG capsule Take 1 capsule (60 mg total) by mouth daily. 30 capsule 12  . sulfamethoxazole-trimethoprim (BACTRIM DS,SEPTRA DS) 800-160 MG tablet Take 1 tablet by mouth 2 (two) times daily. 20 tablet 0   No facility-administered medications prior to visit.     Allergies  Allergen Reactions  . Codeine Nausea Only  . Dilaudid [Hydromorphone Hcl] Nausea And Vomiting    ROS As per  HPI  PE: Blood pressure 132/84, pulse 77, temperature 97.6 F (36.4 C), temperature source Oral, resp. rate 16, weight 204 lb 12.8 oz (92.9 kg), SpO2 96 %. Gen: Alert, well appearing.  Patient is oriented to person, place, time, and situation. AFFECT: pleasant, lucid thought and speech. No further exam today.  LABS:  none  IMPRESSION AND PLAN:  1) GAD/depression/fibromyalgia: past trial of 60mg  qd duloxetine not effective per pt. She wants to ween off of this and try trintellix.  I am ok with this. Instructions: Take 30mg  cymbalta once daily x 3d, then 30mg  duloxetine every other day x 3 doses, then stop this medication. The day after your last dose of cymbalta, start trintellix 10mg  once daily. At f/u 07/09/16 we'll see if we need to try dose increase to 20mg  qd.  Flu vaccine given today.  An After Visit Summary was printed and given to the patient.  FOLLOW UP: Return for keep f/u already scheduled 07/09/16.  Signed:  Crissie Sickles, MD           04/09/2016

## 2016-04-09 NOTE — Addendum Note (Signed)
Addended by: Gordy Councilman on: 04/09/2016 04:34 PM   Modules accepted: Orders

## 2016-04-09 NOTE — Patient Instructions (Signed)
Take 30mg  cymbalta once daily x 3d, then 30mg  duloxetine every other day x 3 doses, then stop this medication. The day after your last dose of cymbalta, start trintellix 10mg  once daily.

## 2016-04-14 ENCOUNTER — Telehealth: Payer: Self-pay | Admitting: Family Medicine

## 2016-04-14 MED ORDER — DULOXETINE HCL 60 MG PO CPEP: 60.0000 mg | ORAL_CAPSULE | Freq: Every day | ORAL | 3 refills | Status: DC

## 2016-04-14 NOTE — Telephone Encounter (Signed)
Pls notify pt that her insurer denied coverage of the Tintellix. Does she want to resume the cymbalta 60mg ? The only other option is for her to try and then fail TWO more different antidepressants (must be a one month trial of each) and then try to get them to cover the trintellix after that.  Let me know-thx

## 2016-04-14 NOTE — Telephone Encounter (Signed)
Patient notified of outcome of insurance denial, patient will stay on Cymbalta 60 mg and has refills at this time.

## 2016-04-14 NOTE — Telephone Encounter (Signed)
Noted. Med list updated.

## 2016-04-14 NOTE — Telephone Encounter (Signed)
Left message for patient to return call.

## 2016-04-19 ENCOUNTER — Other Ambulatory Visit: Payer: Self-pay | Admitting: Family Medicine

## 2016-04-28 ENCOUNTER — Other Ambulatory Visit: Payer: Self-pay

## 2016-04-28 ENCOUNTER — Telehealth: Payer: Self-pay | Admitting: *Deleted

## 2016-04-28 MED ORDER — CYCLOBENZAPRINE HCL 10 MG PO TABS: ORAL_TABLET | ORAL | 3 refills | Status: DC

## 2016-04-28 NOTE — Telephone Encounter (Signed)
Pharmacy requesting refill on Cyclobenzaprine 10 mg. Last seen 04/09/16.

## 2016-04-28 NOTE — Telephone Encounter (Signed)
We are no longer pursuing prior auth for Trintellix for this pt.

## 2016-04-28 NOTE — Telephone Encounter (Signed)
Pharmacy left msg on [name] vm stating calling to f./u on PA for Trintellix 10 mg. Pt does not see MD at this location. Will forward to PCP Dr. Anitra Lauth...[name]

## 2016-05-07 ENCOUNTER — Telehealth: Payer: Self-pay | Admitting: Family Medicine

## 2016-05-07 MED ORDER — OXYCODONE HCL 5 MG PO TABS: ORAL_TABLET | ORAL | 0 refills | Status: DC

## 2016-05-07 NOTE — Telephone Encounter (Signed)
Oxycodone rx printed per pt request. 

## 2016-05-07 NOTE — Telephone Encounter (Signed)
Patient notified that Rx was ready to be picked up.  

## 2016-05-07 NOTE — Telephone Encounter (Signed)
Patient requesting refill of oxyCODONE (OXY IR/ROXICODONE) 5 MG immediate release tablet. ° °

## 2016-05-28 ENCOUNTER — Other Ambulatory Visit: Payer: Self-pay | Admitting: Urology

## 2016-05-28 ENCOUNTER — Ambulatory Visit (HOSPITAL_COMMUNITY)
Admission: RE | Admit: 2016-05-28 | Discharge: 2016-05-28 | Disposition: A | Discharge status: Home / Self Care | Payer: Managed Care, Other (non HMO) | Source: Ambulatory Visit | Attending: Urology | Admitting: Urology

## 2016-05-28 DIAGNOSIS — C641 Malignant neoplasm of right kidney, except renal pelvis: Secondary | ICD-10-CM

## 2016-06-09 ENCOUNTER — Other Ambulatory Visit: Payer: Self-pay | Admitting: Family Medicine

## 2016-06-09 MED ORDER — OXYCODONE HCL 5 MG PO TABS: ORAL_TABLET | ORAL | 0 refills | Status: DC

## 2016-06-09 MED ORDER — LOSARTAN POTASSIUM 50 MG PO TABS: ORAL_TABLET | ORAL | 3 refills | Status: DC

## 2016-06-09 NOTE — Telephone Encounter (Signed)
Oxycodone Rx

## 2016-06-09 NOTE — Telephone Encounter (Signed)
Left detailed message on cell vm stating that Rx was ready for p/u at front desk, okay per DPR.

## 2016-06-09 NOTE — Telephone Encounter (Addendum)
RF request for oxycodone LOV: 04/09/16 Next ov: 07/09/16 Last written: 05/07/16 #30 w/ 0RF  _______________ [facility]  RF request for losartan - Rx sent.  LOV: 01/02/16 Next ov: 07/09/16 Last written: 05/22/15 #180 w/ 3RF

## 2016-06-10 ENCOUNTER — Encounter: Payer: Self-pay | Admitting: Family Medicine

## 2016-06-16 ENCOUNTER — Ambulatory Visit (INDEPENDENT_AMBULATORY_CARE_PROVIDER_SITE_OTHER): Payer: Managed Care, Other (non HMO) | Admitting: Family Medicine

## 2016-06-16 ENCOUNTER — Encounter: Payer: Self-pay | Admitting: *Deleted

## 2016-06-16 ENCOUNTER — Encounter: Payer: Self-pay | Admitting: Family Medicine

## 2016-06-16 VITALS — BP 144/65 | HR 99 | Temp 96.6°F | Resp 16 | Ht 62.0 in | Wt 204.5 lb

## 2016-06-16 DIAGNOSIS — J209 Acute bronchitis, unspecified: Secondary | ICD-10-CM | POA: Diagnosis not present

## 2016-06-16 DIAGNOSIS — B9789 Other viral agents as the cause of diseases classified elsewhere: Secondary | ICD-10-CM | POA: Diagnosis not present

## 2016-06-16 DIAGNOSIS — J069 Acute upper respiratory infection, unspecified: Secondary | ICD-10-CM

## 2016-06-16 MED ORDER — HYDROCODONE-HOMATROPINE 5-1.5 MG/5ML PO SYRP: ORAL_SOLUTION | ORAL | 0 refills | Status: DC

## 2016-06-16 MED ORDER — PREDNISONE 20 MG PO TABS: ORAL_TABLET | ORAL | 0 refills | Status: DC

## 2016-06-16 MED ORDER — ALBUTEROL SULFATE HFA 108 (90 BASE) MCG/ACT IN AERS: 2.0000 | INHALATION_SPRAY | Freq: Four times a day (QID) | RESPIRATORY_TRACT | 0 refills | Status: DC | PRN

## 2016-06-16 NOTE — Progress Notes (Signed)
OFFICE VISIT  06/16/2016   CC:  Chief Complaint  Patient presents with  . URI    x 1 week   HPI:    Patient is a 60 y.o. Caucasian female who presents for respiratory symptoms. Pt presents complaining of respiratory symptoms for 4-5 days.  Primary symptoms are: cough, nasal congestin/runny nose, PND.  Worst symptoms seems to be the cough.  Lately the symptoms seem to be worsening.  Feels chest tightness, SOB.  +Fever blister x 1.  Hurts all over chest and in back when she coughs. Pertinent negatives: No fevers, no wheezing, and no SOB.  No pain in face or teeth.  No significant HA.  ST mild at most.   Symptoms made worse by laying down at night.  Symptoms improved by nothing. Smoker? 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 Muscle or joint aches? no Flu shot this season at least 2 wks ago? yes  Additional ROS: no n/v/d or abdominal pain.  No rash.  No neck stiffness.   +Mild fatigue.  +Mild appetite loss.   Past Medical History:  Diagnosis Date  . Chronic renal insufficiency, stage III (moderate)    GFR 50's  . Fibromyalgia   . GERD (gastroesophageal reflux disease)   . Heart murmur   . Hemorrhoids   . History of bronchitis    childhood, wood stove and coal heat  . History of galactorrhea 02/05/2011  . Hyperlipidemia, mixed   . Hypertension   . IFG (impaired fasting glucose) 2016   HbA1c 5.7-5.8.  Marland Kitchen Knee pain, bilateral 02/05/2011  . Lumbar spondylosis   . Overweight(278.02)   . 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 Wyatt Mage sign of recurrent dz as of this 05/2016 f/u.  Released from surveillance as of 06/03/16; return to urol prn.  . Seasonal allergic rhinitis   . Solitary left kidney 2013   Aquired: right nephrectomy for renal cancer    Past Surgical History:  Procedure Laterality Date  . CESAREAN SECTION  1983  . COLONOSCOPY   04/2013   Normal.  Repeat 2019 ([facility]  . ROBOT ASSISTED LAPAROSCOPIC NEPHRECTOMY  07/12/2012   Procedure: ROBOTIC ASSISTED LAPAROSCOPIC NEPHRECTOMY;  Surgeon: Alexis Frock, MD;  Location: [facility];  Service: Urology;  Laterality: Right;  . TONSILLECTOMY    . TUBAL LIGATION      Outpatient Medications Prior to Visit  Medication Sig Dispense Refill  . acetaminophen (TYLENOL) 500 MG tablet Take 1,000 mg by mouth every 8 (eight) hours as needed.    Marland Kitchen alendronate (FOSAMAX) 70 MG tablet Take 70 mg by mouth once a week.   2  . atorvastatin (LIPITOR) 80 MG tablet Take 1 tablet (80 mg total) by mouth daily. 30 tablet 12  . azelastine (ASTELIN) 0.1 % nasal spray     . cyclobenzaprine (FLEXERIL) 10 MG tablet 1 tab po bid prn 60 tablet 3  . diphenhydramine-acetaminophen (TYLENOL PM) 25-500 MG TABS Take 1 tablet by mouth at bedtime as needed.    . DULoxetine (CYMBALTA) 60 MG capsule Take 1 capsule (60 mg total) by mouth daily. 90 capsule 3  . gabapentin (NEURONTIN) 300 MG capsule TAKE 2 CAPSULES BY MOUTH THREE TIMES A DAY 180 capsule 12  . losartan (COZAAR) 50 MG tablet 1-2 tabs po qd 180 tablet 3  . meloxicam (MOBIC) 15 MG tablet TAKE 1 TABLET BY MOUTH EVERY DAY AS NEEDED  MUSCULOSKELETAL PAIN 30 tablet 4  . Multiple Vitamin (MULTIVITAMIN WITH MINERALS) TABS Take 1 tablet by mouth daily.    Marland Kitchen omeprazole (PRILOSEC) 20 MG capsule Take 1 capsule (20 mg total) by mouth daily as needed. 30 capsule 11  . OVER THE COUNTER MEDICATION Green Tea Fat Burner and Hydroxycut    . oxyCODONE (OXY IR/ROXICODONE) 5 MG immediate release tablet 1-2 tabs po q6h prn pain 30 tablet 0  . promethazine (PHENERGAN) 12.5 MG tablet 1-2 tabs po q6h prn nausea 30 tablet 0  . psyllium (METAMUCIL) 58.6 % powder Take 1 packet by mouth as needed.    . sennosides-docusate sodium (SENOKOT-S) 8.6-50 MG tablet Take 1 tablet by mouth 2 (two) times daily. While taking pain meds to prevent constipation 30 tablet 0  . Wheat Dextrin  (BENEFIBER PO) Take by mouth.    . fluconazole (DIFLUCAN) 150 MG tablet     . metroNIDAZOLE (FLAGYL) 500 MG tablet Take 1 tablet (500 mg total) by mouth 3 (three) times daily. (Patient not taking: Reported on 06/16/2016) 30 tablet 0   No facility-administered medications prior to visit.     Allergies  Allergen Reactions  . Codeine Nausea Only  . Dilaudid [Hydromorphone Hcl] Nausea And Vomiting    ROS As per HPI  PE: Blood pressure (!) 144/65, pulse 99, temperature (!) 96.6 F (35.9 C), temperature source Temporal, resp. rate 16, height 5\' 2"  (1.575 m), weight 204 lb 8 oz (92.8 kg), SpO2 97 %. VS: noted--normal. Gen: alert, NAD, NONTOXIC APPEARING. HEENT: eyes without injection, drainage, or swelling.  Ears: EACs clear, TMs with normal light reflex and landmarks.  Nose: Clear rhinorrhea, with some dried, crusty exudate adherent to mildly injected mucosa.  No purulent d/c.  No paranasal sinus TTP.  No facial swelling.  Throat and mouth without focal lesion.  No pharyngial swelling, erythema, or exudate.   Neck: supple, no LAD.   LUNGS: CTA bilat, nonlabored resps.   CV: RRR, no m/r/g. EXT: no c/c/e SKIN: no rash  LABS:  none  IMPRESSION AND PLAN:  URI with acute bronchitis, suspect viral etiology. Prednisone 40mg  qd x 5d, then 20mg  qd x 5d. Ventolin HFA 1-2 p q4h prn. Hycodan syrup, 1-2 tsp qhs prn, #120 ml.  Therapeutic expectations and side effect profile of medication discussed today.  Patient's questions answered. Signs/symptoms to call or return for were reviewed and pt expressed understanding.  An After Visit Summary was printed and given to the patient.  FOLLOW UP: Return if symptoms worsen or fail to improve.  Signed:  Crissie Sickles, MD           06/16/2016

## 2016-06-16 NOTE — Progress Notes (Signed)
Pre visit review using our clinic review tool, if applicable. No additional management support is needed unless otherwise documented below in the visit note. 

## 2016-06-16 NOTE — Patient Instructions (Signed)
Get otc generic robitussin DM OR Mucinex DM and use as directed on the packaging for cough and congestion. Use otc generic saline nasal spray 2-3 times per day to irrigate/moisturize your nasal passages.   

## 2016-06-17 ENCOUNTER — Telehealth: Payer: Self-pay

## 2016-06-17 MED ORDER — AMOXICILLIN-POT CLAVULANATE 875-125 MG PO TABS: 1.0000 | ORAL_TABLET | Freq: Two times a day (BID) | ORAL | 0 refills | Status: DC

## 2016-06-17 NOTE — Telephone Encounter (Signed)
Patient called stating that she is having pain on left side of head and face, around sinus area.  She is requesting an antibiotic for sinus infection. Please advise.

## 2016-06-17 NOTE — Telephone Encounter (Signed)
Augmentin eRx'd to her local pharmacy.

## 2016-06-17 NOTE — Telephone Encounter (Signed)
Patient notified and verbalized understanding. 

## 2016-06-26 ENCOUNTER — Other Ambulatory Visit: Payer: Self-pay | Admitting: *Deleted

## 2016-06-26 MED ORDER — DULOXETINE HCL 60 MG PO CPEP: 60.0000 mg | ORAL_CAPSULE | Freq: Every day | ORAL | 3 refills | Status: DC

## 2016-06-26 MED ORDER — GABAPENTIN 300 MG PO CAPS: ORAL_CAPSULE | ORAL | 3 refills | Status: DC

## 2016-06-26 NOTE — Telephone Encounter (Signed)
[facility] requesting 90 day supply for gabapentin and duloxetine.  LOV: 04/09/16 NOV: 07/09/16  RF request for duloxetine Last written: 02/25/16 #30 w/ [id number]  RF request for gabapentin Last written: 04/14/16 #180 w/ 3RF

## 2016-07-08 ENCOUNTER — Other Ambulatory Visit: Payer: Self-pay | Admitting: *Deleted

## 2016-07-08 MED ORDER — ATORVASTATIN CALCIUM 80 MG PO TABS: 80.0000 mg | ORAL_TABLET | Freq: Every day | ORAL | 1 refills | Status: DC

## 2016-07-08 NOTE — Telephone Encounter (Signed)
RF request for atorvastatin requesting 90 day supply. LOV: 04/09/16 Next ov: 07/09/16 Last written: 07/10/15 #30 w/ 12RF

## 2016-07-09 ENCOUNTER — Encounter: Payer: Self-pay | Admitting: Family Medicine

## 2016-07-09 ENCOUNTER — Ambulatory Visit (INDEPENDENT_AMBULATORY_CARE_PROVIDER_SITE_OTHER): Payer: Managed Care, Other (non HMO) | Admitting: Family Medicine

## 2016-07-09 VITALS — BP 134/82 | HR 95 | Temp 97.7°F | Resp 16 | Ht 62.5 in | Wt 207.5 lb

## 2016-07-09 DIAGNOSIS — Z0000 Encounter for general adult medical examination without abnormal findings: Secondary | ICD-10-CM | POA: Diagnosis not present

## 2016-07-09 DIAGNOSIS — Z1159 Encounter for screening for other viral diseases: Secondary | ICD-10-CM | POA: Diagnosis not present

## 2016-07-09 LAB — COMPREHENSIVE METABOLIC PANEL
ALBUMIN: 4.2 g/dL (ref 3.5–5.2)
ALK PHOS: 101 U/L (ref 39–117)
ALT: 30 U/L (ref 0–35)
AST: 21 U/L (ref 0–37)
BUN: 12 mg/dL (ref 6–23)
CO2: 26 mEq/L (ref 19–32)
Calcium: 9.4 mg/dL (ref 8.4–10.5)
Chloride: 103 mEq/L (ref 96–112)
Creatinine, Ser: 1.05 mg/dL (ref 0.40–1.20)
GFR: 57.1 mL/min — AB (ref >60.00)
GLUCOSE: 104 mg/dL — AB (ref 70–99)
POTASSIUM: 4.4 meq/L (ref 3.5–5.1)
Sodium: 139 mEq/L (ref 135–145)
TOTAL PROTEIN: 6.7 g/dL (ref 6.0–8.3)
Total Bilirubin: 0.5 mg/dL (ref 0.2–1.2)

## 2016-07-09 LAB — LIPID PANEL
CHOL/HDL RATIO: 4
CHOLESTEROL: 231 mg/dL — AB (ref 0–200)
HDL: 64 mg/dL (ref >39.00)
Triglycerides: 408 mg/dL — ABNORMAL HIGH (ref 0.0–149.0)

## 2016-07-09 LAB — LDL CHOLESTEROL, DIRECT: Direct LDL: 123 mg/dL

## 2016-07-09 LAB — TSH: TSH: 1.06 u[IU]/mL (ref 0.35–4.50)

## 2016-07-09 LAB — HEPATITIS C ANTIBODY: HCV Ab: NEGATIVE

## 2016-07-09 MED ORDER — OXYCODONE HCL 5 MG PO TABS: ORAL_TABLET | ORAL | 0 refills | Status: DC

## 2016-07-09 MED ORDER — FLUCONAZOLE 150 MG PO TABS: 150.0000 mg | ORAL_TABLET | Freq: Once | ORAL | 0 refills | Status: AC

## 2016-07-09 NOTE — Progress Notes (Addendum)
Office Note 07/09/2016  CC:  Chief Complaint  Patient presents with  . Annual Exam    Pt is fasting.     HPI:  [name] is a 60 y.o. White female who is here for annual health maintenance exam. Her GYN care is done through Dr. Matthew Saras.  Mammo done 04/13/16.  Plan is for bone density next year. Still with some cough from recent resp illness.  Has vag yeast infection from abx use: itching, stinging, a little discharge.  Asks for diflucan.   Past Medical History:  Diagnosis Date  . Chronic renal insufficiency, stage III (moderate)    GFR 50's  . Fibromyalgia   . GERD (gastroesophageal reflux disease)   . Heart murmur   . Hemorrhoids   . History of bronchitis    childhood, wood stove and coal heat  . History of galactorrhea 02/05/2011  . Hyperlipidemia, mixed   . Hypertension   . IFG (impaired fasting glucose) 2016   HbA1c 5.7-5.8.  Marland Kitchen Knee pain, bilateral 02/05/2011  . Lumbar spondylosis   . Overweight(278.02)   . 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 Wyatt Mage sign of recurrent dz as of this 05/2016 f/u.  Released from surveillance as of 06/03/16; return to urol prn.  . Seasonal allergic rhinitis   . Solitary left kidney 2013   Aquired: right nephrectomy for renal cancer    Past Surgical History:  Procedure Laterality Date  . CESAREAN SECTION  1983  . COLONOSCOPY  04/2013   Normal.  Repeat 2019 ([facility]  . ROBOT ASSISTED LAPAROSCOPIC NEPHRECTOMY  07/12/2012   Procedure: ROBOTIC ASSISTED LAPAROSCOPIC NEPHRECTOMY;  Surgeon: Alexis Frock, MD;  Location: [facility];  Service: Urology;  Laterality: Right;  . TONSILLECTOMY    . TUBAL LIGATION      Family History  Problem Relation Age of Onset  . Thyroid disease Mother   . Heart disease Mother     CHF, heart murmur, PAD  . Kidney disease Mother     RAS  . Other Mother 9    bowel  obstruction,surgery this year  . Colon cancer Father 60  . Cancer Sister     cervical, breast  . Thyroid disease Sister   . Heart disease Brother   . Thyroid disease Maternal Grandfather   . Cancer Maternal Grandfather   . Diabetes Paternal Grandmother   . Cancer Paternal Grandmother     breast  . Heart disease Paternal Grandmother   . Heart disease Paternal Grandfather   . Diabetes Paternal Grandfather   . Hypertension Paternal Grandfather   . Thyroid disease Sister   . Diabetes Sister   . Diabetes Sister   . Hypertension Sister   . Cancer Paternal Aunt     breast cancer  . Heart disease Maternal Grandmother   . Thyroid disease Maternal Grandmother   . Cancer Sister     kidney  . Asthma Daughter   . Allergies Daughter   . Lung disease Son     recurrent bronchitis  . Lung disease Daughter     recurrent bronchitis    Social History   Social History  . Marital status: Married    Spouse name: Jerrye Beavers  . Number of children: 3  . Years of education: N/A   Occupational History  . Not on file.   Social History Main Topics  . Smoking status: Former Smoker  Packs/day: 2.00    Years: 5.00    Types: Cigarettes    Quit date: 07/14/1981  . Smokeless tobacco: Never Used  . Alcohol use No  . Drug use: No  . Sexual activity: Yes   Other Topics Concern  . Not on file   Social History Narrative   Married, 3 children.   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   No tobacco, alcohol, or drugs.  No exercise.    Outpatient Medications Prior to Visit  Medication Sig Dispense Refill  . acetaminophen (TYLENOL) 500 MG tablet Take 1,000 mg by mouth every 8 (eight) hours as needed.    Marland Kitchen albuterol (VENTOLIN HFA) 108 (90 Base) MCG/ACT inhaler Inhale 2 puffs into the lungs every 6 (six) hours as needed for wheezing or shortness of breath. 1 Inhaler 0  . atorvastatin (LIPITOR) 80 MG tablet Take 1 tablet (80 mg total) by mouth daily. 90 tablet 1  . azelastine (ASTELIN) 0.1 % nasal  spray     . cyclobenzaprine (FLEXERIL) 10 MG tablet 1 tab po bid prn 60 tablet 3  . diphenhydramine-acetaminophen (TYLENOL PM) 25-500 MG TABS Take 1 tablet by mouth at bedtime as needed.    . DULoxetine (CYMBALTA) 60 MG capsule Take 1 capsule (60 mg total) by mouth daily. 90 capsule 3  . gabapentin (NEURONTIN) 300 MG capsule TAKE 2 CAPSULES BY MOUTH THREE TIMES A DAY 540 capsule 3  . HYDROcodone-homatropine (HYCODAN) 5-1.5 MG/5ML syrup 1-2 tsp po qhs prn cough 120 mL 0  . losartan (COZAAR) 50 MG tablet 1-2 tabs po qd 180 tablet 3  . meloxicam (MOBIC) 15 MG tablet TAKE 1 TABLET BY MOUTH EVERY DAY AS NEEDED MUSCULOSKELETAL PAIN 30 tablet 4  . Multiple Vitamin (MULTIVITAMIN WITH MINERALS) TABS Take 1 tablet by mouth daily.    Marland Kitchen omeprazole (PRILOSEC) 20 MG capsule Take 1 capsule (20 mg total) by mouth daily as needed. 30 capsule 11  . oxyCODONE (OXY IR/ROXICODONE) 5 MG immediate release tablet 1-2 tabs po q6h prn pain 30 tablet 0  . promethazine (PHENERGAN) 12.5 MG tablet 1-2 tabs po q6h prn nausea 30 tablet 0  . psyllium (METAMUCIL) 58.6 % powder Take 1 packet by mouth as needed.    . sennosides-docusate sodium (SENOKOT-S) 8.6-50 MG tablet Take 1 tablet by mouth 2 (two) times daily. While taking pain meds to prevent constipation 30 tablet 0  . Wheat Dextrin (BENEFIBER PO) Take by mouth.    Marland Kitchen alendronate (FOSAMAX) 70 MG tablet Take 70 mg by mouth once a week.   2  . amoxicillin-clavulanate (AUGMENTIN) 875-125 MG tablet Take 1 tablet by mouth 2 (two) times daily. (Patient not taking: Reported on 07/09/2016) 20 tablet 0  . OVER THE COUNTER MEDICATION Green Tea Fat Burner and Hydroxycut    . predniSONE (DELTASONE) 20 MG tablet 2 tabs po qd x 5d, then 1 tab po qd x 5d (Patient not taking: Reported on 07/09/2016) 15 tablet 0   No facility-administered medications prior to visit.     Allergies  Allergen Reactions  . Codeine Nausea Only  . Dilaudid [Hydromorphone Hcl] Nausea And Vomiting     ROS Review of Systems  Constitutional: Negative for appetite change, chills, fatigue and fever.  HENT: Negative for congestion, dental problem, ear pain and sore throat.   Eyes: Negative for discharge, redness and visual disturbance.  Respiratory: Negative for cough, chest tightness, shortness of breath and wheezing.   Cardiovascular: Negative for chest pain, palpitations and leg swelling.  Gastrointestinal: Negative for abdominal pain, blood in stool, diarrhea, nausea and vomiting.  Genitourinary: Negative for difficulty urinating, dysuria, flank pain, frequency, hematuria and urgency.  Musculoskeletal: Positive for arthralgias (chronic) and myalgias (chronic). Negative for back pain, joint swelling and neck stiffness.  Skin: Negative for pallor and rash.  Neurological: Negative for dizziness, speech difficulty, weakness and headaches.  Hematological: Negative for adenopathy. Does not bruise/bleed easily.  Psychiatric/Behavioral: Negative for confusion and sleep disturbance. The patient is not nervous/anxious.     PE; Blood pressure 134/82, pulse 95, temperature 97.7 F (36.5 C), temperature source Oral, resp. rate 16, height 5' 2.5" (1.588 m), weight 207 lb 8 oz (94.1 kg), SpO2 99 %. Gen: Alert, well appearing.  Patient is oriented to person, place, time, and situation. AFFECT: pleasant, lucid thought and speech. ENT: Ears: EACs clear, normal epithelium.  TMs with good light reflex and landmarks bilaterally.  Eyes: no injection, icteris, swelling, or exudate.  EOMI, R pupil 2-3 mm, L pupil 1-2 mm (this difference is chronic) Nose: no drainage or turbinate edema/swelling.  No injection or focal lesion.  Mouth: lips without lesion/swelling.  Oral mucosa pink and moist.  Dentition intact and without obvious caries or gingival swelling.  Oropharynx without erythema, exudate, or swelling.  Neck: supple/nontender.  No LAD, mass, or TM.  Carotid pulses 2+ bilaterally, without bruits. CV:  RRR, no m/r/g.   LUNGS: CTA bilat, nonlabored resps, good aeration in all lung fields. ABD: soft, NT, ND, BS normal.  No hepatospenomegaly or mass.  No bruits. EXT: no clubbing, cyanosis, or edema.  Musculoskeletal: no joint swelling, erythema, warmth, or tenderness.  ROM of all joints intact. Skin - no sores or suspicious lesions or rashes or color changes   Pertinent labs:  Lab Results  Component Value Date   TSH 1.300 07/04/2015   Lab Results  Component Value Date   WBC 7.4 01/02/2016   HGB 12.7 01/02/2016   HCT 37.7 01/02/2016   MCV 90.8 01/02/2016   PLT 294 01/02/2016   Lab Results  Component Value Date   CREATININE 1.09 (H) 01/02/2016   BUN 15 01/02/2016   NA 141 01/02/2016   K 4.7 01/02/2016   CL 105 01/02/2016   CO2 25 01/02/2016   Lab Results  Component Value Date   ALT 20 07/04/2015   AST 17 07/04/2015   ALKPHOS 100 07/04/2015   BILITOT 0.5 07/04/2015   Lab Results  Component Value Date   CHOL 190 07/04/2015   Lab Results  Component Value Date   HDL 68 07/04/2015   Lab Results  Component Value Date   LDLCALC 77 07/04/2015   Lab Results  Component Value Date   TRIG 225 (H) 07/04/2015   Lab Results  Component Value Date   CHOLHDL 2.8 07/04/2015   Lab Results  Component Value Date   HGBA1C 5.8 (H) 01/02/2016   ASSESSMENT AND PLAN:   Health maintenance exam: Reviewed age and gender appropriate health maintenance issues (prudent diet, regular exercise, health risks of tobacco and excessive alcohol, use of seatbelts, fire alarms in home, use of sunscreen).  Also reviewed age and gender appropriate health screening as well as vaccine recommendations. Fasting lipids, CMET, and TSH drawn today.  Hep C screening desired by pt as well. Cervical and breast ca screening via her GYN, Dr. Matthew Saras. Colon ca screening: next colonoscopy due 04/2018.  Pt requested diflucan for yeast vaginitis symptoms that have recently come up after taking a course of  antibiotics.  I  sent in diflucan 150mg  qd x 1d, no RF.  It appears her URI and acute bronchitis are resolving, albeit slowly.  No further meds today for this.  An After Visit Summary was printed and given to the patient.  FOLLOW UP:  Return in about 4 months (around 11/07/2016) for routine chronic illness f/u (fibro pain/pain meds).  Signed:  Crissie Sickles, MD           07/09/2016

## 2016-07-09 NOTE — Patient Instructions (Signed)

## 2016-07-09 NOTE — Addendum Note (Signed)
Addended by: Tammi Sou on: 07/09/2016 11:24 AM   Modules accepted: Orders

## 2016-07-09 NOTE — Progress Notes (Signed)
Pre visit review using our clinic review tool, if applicable. No additional management support is needed unless otherwise documented below in the visit note. 

## 2016-07-10 ENCOUNTER — Other Ambulatory Visit: Payer: Self-pay | Admitting: Family Medicine

## 2016-07-10 MED ORDER — ROSUVASTATIN CALCIUM 20 MG PO TABS: 20.0000 mg | ORAL_TABLET | Freq: Every day | ORAL | 6 refills | Status: DC

## 2016-08-05 ENCOUNTER — Telehealth: Payer: Self-pay | Admitting: Family Medicine

## 2016-08-05 NOTE — Telephone Encounter (Signed)
Pt requesting refill of oxyCODONE (OXY IR/ROXICODONE) 5 MG immediate release tablet.

## 2016-08-06 MED ORDER — OXYCODONE HCL 5 MG PO TABS: ORAL_TABLET | ORAL | 0 refills | Status: DC

## 2016-08-06 NOTE — Telephone Encounter (Signed)
Oxycodone rx printed. 

## 2016-08-06 NOTE — Telephone Encounter (Signed)
RF request for oxycodone LOV: 07/09/16 Next ov: 10/29/16 Last written: 07/09/16 #30 w/ [id number]  Please advise. Thanks.

## 2016-08-06 NOTE — Telephone Encounter (Signed)
Rx printed, signed and put up front for p/u.  Pt advised and voiced understanding.   

## 2016-08-20 ENCOUNTER — Telehealth: Payer: Self-pay | Admitting: *Deleted

## 2016-08-20 NOTE — Telephone Encounter (Signed)
Fax from [facility].   Alternative Request to change omeprazole 20mg  to Pantoprazole 40mg , Lansoprazole 30mg , Esomepra Mag 20mg , Rabeprazole 20mg  are covered.  Please advise. Thanks.

## 2016-08-20 NOTE — Telephone Encounter (Signed)
OK to change to pantoprazole 40mg , 1 tab po qAM, #30, RF x 12.-thx

## 2016-08-21 MED ORDER — PANTOPRAZOLE SODIUM 40 MG PO TBEC: 40.0000 mg | DELAYED_RELEASE_TABLET | Freq: Every day | ORAL | 12 refills | Status: DC

## 2016-08-21 NOTE — Telephone Encounter (Signed)
Pt advised and voiced understanding.  Rx sent to [facility].

## 2016-09-03 ENCOUNTER — Other Ambulatory Visit: Payer: Self-pay | Admitting: Family Medicine

## 2016-09-03 MED ORDER — OXYCODONE HCL 5 MG PO TABS: ORAL_TABLET | ORAL | 0 refills | Status: DC

## 2016-09-03 NOTE — Telephone Encounter (Signed)
RF request for oxycodone LOV: 07/09/16 Next ov: 10/29/16 Last written: 08/06/16 #30 w/ 0RF  Please advise. Thanks.

## 2016-09-03 NOTE — Telephone Encounter (Signed)
Pt asking for refill on oxycodone

## 2016-09-05 NOTE — Telephone Encounter (Signed)
Fax sent to pharmacy.  

## 2016-09-09 ENCOUNTER — Other Ambulatory Visit: Payer: Self-pay | Admitting: Family Medicine

## 2016-09-09 ENCOUNTER — Telehealth: Payer: Self-pay | Admitting: Family Medicine

## 2016-09-09 NOTE — Telephone Encounter (Signed)
Patients is returning a call from [place], [place].  Please call patient back about her medication refill.  Thank you,  -LL

## 2016-09-09 NOTE — Telephone Encounter (Signed)
Left message for pt to call back.   Celebrex was d/c due to cost. Need to verify this is the medication pt wants to have refilled.

## 2016-09-09 NOTE — Telephone Encounter (Signed)
Patient needs refill on [facility]brex [facility]  [id number] [place], [place] [phone] (Phone) [phone] (Fax)   Patient was not sure if she needed a follow up appointment or could just get a refill on the rx. Please contact patient at [phone] to advise once rx is filled.

## 2016-09-09 NOTE — Telephone Encounter (Signed)
Patient returning [facility] phone call. Please call patient back about medication refill.

## 2016-09-10 ENCOUNTER — Other Ambulatory Visit: Payer: Self-pay | Admitting: Family Medicine

## 2016-09-10 MED ORDER — CELECOXIB 200 MG PO CAPS: ORAL_CAPSULE | ORAL | 6 refills | Status: DC

## 2016-09-10 NOTE — Telephone Encounter (Signed)
OK, will rx celebrex again.

## 2016-09-10 NOTE — Telephone Encounter (Signed)
SW pt and she stated that she is not tolerating the meloxicam well. She stated that its starting to hurt her stomach and cause her heartburn to be worse. She would like to go back to the Celebrex. Please advise. Thanks.

## 2016-09-10 NOTE — Telephone Encounter (Signed)
Duplicate note

## 2016-09-10 NOTE — Telephone Encounter (Signed)
Patient notified and expressed understanding.

## 2016-10-02 ENCOUNTER — Telehealth: Payer: Self-pay | Admitting: Family Medicine

## 2016-10-02 MED ORDER — OXYCODONE HCL 5 MG PO TABS: ORAL_TABLET | ORAL | 0 refills | Status: DC

## 2016-10-02 NOTE — Telephone Encounter (Signed)
Patient is calling because her narcotic script needs prior authorization.  Thank you,  -LL

## 2016-10-02 NOTE — Telephone Encounter (Signed)
[facility], pt has no medications to be picked up at this time that require PA, however, she had to pay out of pocket for her oxycodone last time because she didn't want to wait for PA to be done.  Pharmacist explained that many insurance companies are requiring PA's for narcotic drug use starting this year.    I advised patient, I can not do PA without a need for it, she will need to wait until another RX is submitted to pharmacy to begin PA.  She expressed understanding.  RF request for oxycodone.   Last RX 09/03/16. Last OV 07/03/17. Next OV 10/29/16.  Please advise.

## 2016-10-02 NOTE — Telephone Encounter (Signed)
Oxycodone rx printed. 

## 2016-10-03 NOTE — Telephone Encounter (Signed)
Left message on pts cell advising that Rx is ready for p/u.

## 2016-10-29 ENCOUNTER — Ambulatory Visit: Payer: Managed Care, Other (non HMO) | Admitting: Family Medicine

## 2016-10-29 ENCOUNTER — Encounter: Payer: Self-pay | Admitting: Family Medicine

## 2016-10-29 NOTE — Progress Notes (Deleted)
OFFICE VISIT  10/29/2016   CC: No chief complaint on file.   HPI:    Patient is a 61 y.o. Caucasian female who presents for 4 mo f/u HTN, hyperlipidemia, CRI stage III. Last o/v her cholesterol was still up, decided to make a switch from atorva to rosuvastatin 20mg  qd. Also, she has dx of fibromyalgia for which I rx oxycodone to help keep her maximally functional and maintain quality of life.  Indication for chronic opioid: noted above. Medication and dose: oxycodone 5mg , 1 q6h prn # pills per month: 30 Last UDS date: none Pain contract signed (Y/N): No--but this was done today. Date narcotic database last reviewed (include red flags): today.   Past Medical History:  Diagnosis Date  . Chronic renal insufficiency, stage III (moderate)    GFR 50's  . Fibromyalgia   . GERD (gastroesophageal reflux disease)   . Heart murmur   . Hemorrhoids   . History of bronchitis    childhood, wood stove and coal heat  . History of galactorrhea 02/05/2011  . Hyperlipidemia, mixed   . Hypertension   . IFG (impaired fasting glucose) 2016   HbA1c 5.7-5.8.  Marland Kitchen Knee pain, bilateral 02/05/2011  . Lumbar spondylosis   . Overweight(278.02)   . 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 Wyatt Mage sign of recurrent dz as of this 05/2016 f/u.  Released from surveillance as of 06/03/16; return to urol prn.  . Seasonal allergic rhinitis   . Solitary left kidney 2013   Aquired: right nephrectomy for renal cancer    Past Surgical History:  Procedure Laterality Date  . CESAREAN SECTION  1983  . COLONOSCOPY  04/2013   Normal.  Repeat 2019 ([facility] of [facility]  . ROBOT ASSISTED LAPAROSCOPIC NEPHRECTOMY  07/12/2012   Procedure: ROBOTIC ASSISTED LAPAROSCOPIC NEPHRECTOMY;  Surgeon: Alexis Frock, MD;  Location: [facility];  Service: Urology;  Laterality: Right;  . TONSILLECTOMY    . TUBAL LIGATION       Outpatient Medications Prior to Visit  Medication Sig Dispense Refill  . acetaminophen (TYLENOL) 500 MG tablet Take 1,000 mg by mouth every 8 (eight) hours as needed.    Marland Kitchen albuterol (VENTOLIN HFA) 108 (90 Base) MCG/ACT inhaler Inhale 2 puffs into the lungs every 6 (six) hours as needed for wheezing or shortness of breath. 1 Inhaler 0  . azelastine (ASTELIN) 0.1 % nasal spray     . celecoxib (CELEBREX) 200 MG capsule 1 tab po qd prn musculoskeletal pain 30 capsule 6  . cyclobenzaprine (FLEXERIL) 10 MG tablet TAKE 1 TABLET BY MOUTH TWICE A DAY AS NEEDED 60 tablet 5  . diphenhydramine-acetaminophen (TYLENOL PM) 25-500 MG TABS Take 1 tablet by mouth at bedtime as needed.    . DULoxetine (CYMBALTA) 60 MG capsule Take 1 capsule (60 mg total) by mouth daily. 90 capsule 3  . gabapentin (NEURONTIN) 300 MG capsule TAKE 2 CAPSULES BY MOUTH THREE TIMES A DAY 540 capsule 3  . HYDROcodone-homatropine (HYCODAN) 5-1.5 MG/5ML syrup 1-2 tsp po qhs prn cough 120 mL 0  . losartan (COZAAR) 50 MG tablet 1-2 tabs po qd 180 tablet 3  . Multiple Vitamin (MULTIVITAMIN WITH MINERALS) TABS Take 1 tablet by mouth daily.    Marland Kitchen oxyCODONE (OXY IR/ROXICODONE) 5 MG immediate release tablet 1-2 tabs po q6h prn pain 30 tablet 0  . pantoprazole (PROTONIX) 40 MG tablet Take 1 tablet (40 mg total) by  mouth daily. 30 tablet 12  . promethazine (PHENERGAN) 12.5 MG tablet 1-2 tabs po q6h prn nausea 30 tablet 0  . psyllium (METAMUCIL) 58.6 % powder Take 1 packet by mouth as needed.    . rosuvastatin (CRESTOR) 20 MG tablet Take 1 tablet (20 mg total) by mouth daily. 30 tablet 6  . sennosides-docusate sodium (SENOKOT-S) 8.6-50 MG tablet Take 1 tablet by mouth 2 (two) times daily. While taking pain meds to prevent constipation 30 tablet 0  . Wheat Dextrin (BENEFIBER PO) Take by mouth.     No facility-administered medications prior to visit.     Allergies  Allergen Reactions  . Codeine Nausea Only  . Dilaudid [Hydromorphone Hcl]  Nausea And Vomiting    ROS As per HPI  PE: There were no vitals taken for this visit. ***  LABS:  Lab Results  Component Value Date   TSH 1.06 07/09/2016   Lab Results  Component Value Date   WBC 7.4 01/02/2016   HGB 12.7 01/02/2016   HCT 37.7 01/02/2016   MCV 90.8 01/02/2016   PLT 294 01/02/2016   Lab Results  Component Value Date   CREATININE 1.05 07/09/2016   BUN 12 07/09/2016   NA 139 07/09/2016   K 4.4 07/09/2016   CL 103 07/09/2016   CO2 26 07/09/2016   Lab Results  Component Value Date   ALT 30 07/09/2016   AST 21 07/09/2016   ALKPHOS 101 07/09/2016   BILITOT 0.5 07/09/2016   Lab Results  Component Value Date   CHOL 231 (H) 07/09/2016   Lab Results  Component Value Date   HDL 64.00 07/09/2016   Lab Results  Component Value Date   LDLCALC 77 07/04/2015   Lab Results  Component Value Date   TRIG (H) 07/09/2016    408.0 =Triglyceride is over 400; calculations on Lipids are invalid.   Lab Results  Component Value Date   CHOLHDL 4 07/09/2016   Lab Results  Component Value Date   HGBA1C 5.8 (H) 01/02/2016   IMPRESSION AND PLAN:  No problem-specific Assessment & Plan notes found for this encounter.   FOLLOW UP: No Follow-up on file.

## 2016-11-05 ENCOUNTER — Other Ambulatory Visit: Payer: Self-pay | Admitting: Family Medicine

## 2016-11-05 MED ORDER — OXYCODONE HCL 5 MG PO TABS: ORAL_TABLET | ORAL | 0 refills | Status: DC

## 2016-11-05 NOTE — Telephone Encounter (Signed)
RF request for oxycodone LOV: 07/09/16 Next ov: 11/12/16 Last written: 10/02/16 #30 w/ 0RF  Please advise. Thanks.

## 2016-11-05 NOTE — Telephone Encounter (Signed)
Oxycodone  °

## 2016-11-06 NOTE — Telephone Encounter (Signed)
Rx put up front for p/u. Pt advised and voiced understanding.   

## 2016-11-07 ENCOUNTER — Encounter: Payer: Self-pay | Admitting: Family Medicine

## 2016-11-07 ENCOUNTER — Ambulatory Visit (INDEPENDENT_AMBULATORY_CARE_PROVIDER_SITE_OTHER): Payer: Self-pay | Admitting: Family Medicine

## 2016-11-07 VITALS — BP 122/64 | HR 81 | Temp 98.2°F | Resp 16 | Wt 200.5 lb

## 2016-11-07 DIAGNOSIS — G894 Chronic pain syndrome: Secondary | ICD-10-CM

## 2016-11-07 DIAGNOSIS — N183 Chronic kidney disease, stage 3 unspecified: Secondary | ICD-10-CM

## 2016-11-07 DIAGNOSIS — I10 Essential (primary) hypertension: Secondary | ICD-10-CM

## 2016-11-07 DIAGNOSIS — Z0000 Encounter for general adult medical examination without abnormal findings: Secondary | ICD-10-CM

## 2016-11-07 DIAGNOSIS — E7800 Pure hypercholesterolemia, unspecified: Secondary | ICD-10-CM

## 2016-11-07 DIAGNOSIS — Z23 Encounter for immunization: Secondary | ICD-10-CM

## 2016-11-07 LAB — LIPID PANEL
CHOLESTEROL: 190 mg/dL (ref 0–200)
HDL: 64.6 mg/dL (ref >39.00)
LDL Cholesterol: 90 mg/dL (ref 0–99)
NonHDL: 124.92
TRIGLYCERIDES: 174 mg/dL — AB (ref 0.0–149.0)
Total CHOL/HDL Ratio: 3
VLDL: 34.8 mg/dL (ref 0.0–40.0)

## 2016-11-07 LAB — BASIC METABOLIC PANEL
BUN: 13 mg/dL (ref 6–23)
CHLORIDE: 104 meq/L (ref 96–112)
CO2: 27 mEq/L (ref 19–32)
Calcium: 9.4 mg/dL (ref 8.4–10.5)
Creatinine, Ser: 1.06 mg/dL (ref 0.40–1.20)
GFR: 56.42 mL/min — ABNORMAL LOW (ref >60.00)
GLUCOSE: 103 mg/dL — AB (ref 70–99)
POTASSIUM: 4.6 meq/L (ref 3.5–5.1)
Sodium: 139 mEq/L (ref 135–145)

## 2016-11-07 MED ORDER — OXYCODONE HCL 5 MG PO TABS: ORAL_TABLET | ORAL | 0 refills | Status: DC

## 2016-11-07 NOTE — Addendum Note (Signed)
Addended by: Onalee Hua on: 11/07/2016 12:15 PM   Modules accepted: Orders

## 2016-11-07 NOTE — Progress Notes (Signed)
OFFICE VISIT  11/07/2016   CC:  Chief Complaint  Patient presents with  . Follow-up    lab and medication   HPI:    Patient is a 61 y.o. Caucasian female who presents for 4 mo f/u HTN, hyperlipidemia, CRI stage III (GFR 50s) and chronic pain syndrome. I rx her narcotic pain medication for bilat knee arthritis and low back arthrits + fibromyalgia in order to maximize functioning and quality of life.  Indication for chronic opioid: see above. Medication and dose: oxycodone 5mg  # pills per month: 30 Last UDS date: none Pain contract signed (Y/N): Yes, today. Date narcotic database last reviewed (include red flags): reviewed--no red flags. Pain well controlled on current pain med use.    HTN: consistently 120s/80s.  HLD: couldn't afford generic crestor after 2 months (lost her job) so she continued taking atorva 80mg  qd and is tolerating this med well.  Past Medical History:  Diagnosis Date  . Chronic renal insufficiency, stage III (moderate)    GFR 50's  . Fibromyalgia   . GERD (gastroesophageal reflux disease)   . Heart murmur   . Hemorrhoids   . History of bronchitis    childhood, wood stove and coal heat  . History of galactorrhea 02/05/2011  . Hyperlipidemia, mixed   . Hypertension   . IFG (impaired fasting glucose) 2016   HbA1c 5.7-5.8.  Marland Kitchen Knee pain, bilateral 02/05/2011  . Lumbar spondylosis   . Overweight(278.02)   . 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 Wyatt Mage sign of recurrent dz as of this 05/2016 f/u.  Released from surveillance as of 06/03/16; return to urol prn.  . Seasonal allergic rhinitis   . Solitary left kidney 2013   Aquired: right nephrectomy for renal cancer    Past Surgical History:  Procedure Laterality Date  . CESAREAN SECTION  1983  . COLONOSCOPY  04/2013   Normal.  Repeat 2019 ([facility] of [facility]  . ROBOT ASSISTED  LAPAROSCOPIC NEPHRECTOMY  07/12/2012   Procedure: ROBOTIC ASSISTED LAPAROSCOPIC NEPHRECTOMY;  Surgeon: Alexis Frock, MD;  Location: [facility];  Service: Urology;  Laterality: Right;  . TONSILLECTOMY    . TUBAL LIGATION      Outpatient Medications Prior to Visit  Medication Sig Dispense Refill  . acetaminophen (TYLENOL) 500 MG tablet Take 1,000 mg by mouth every 8 (eight) hours as needed.    Marland Kitchen albuterol (VENTOLIN HFA) 108 (90 Base) MCG/ACT inhaler Inhale 2 puffs into the lungs every 6 (six) hours as needed for wheezing or shortness of breath. 1 Inhaler 0  . azelastine (ASTELIN) 0.1 % nasal spray     . celecoxib (CELEBREX) 200 MG capsule 1 tab po qd prn musculoskeletal pain 30 capsule 6  . cyclobenzaprine (FLEXERIL) 10 MG tablet TAKE 1 TABLET BY MOUTH TWICE A DAY AS NEEDED 60 tablet 5  . diphenhydramine-acetaminophen (TYLENOL PM) 25-500 MG TABS Take 1 tablet by mouth at bedtime as needed.    . DULoxetine (CYMBALTA) 60 MG capsule Take 1 capsule (60 mg total) by mouth daily. 90 capsule 3  . gabapentin (NEURONTIN) 300 MG capsule TAKE 2 CAPSULES BY MOUTH THREE TIMES A DAY 540 capsule 3  . losartan (COZAAR) 50 MG tablet 1-2 tabs po qd 180 tablet 3  . Multiple Vitamin (MULTIVITAMIN WITH MINERALS) TABS Take 1 tablet by mouth daily.    . pantoprazole (PROTONIX) 40 MG tablet Take 1 tablet (40 mg total)  by mouth daily. 30 tablet 12  . promethazine (PHENERGAN) 12.5 MG tablet 1-2 tabs po q6h prn nausea 30 tablet 0  . psyllium (METAMUCIL) 58.6 % powder Take 1 packet by mouth as needed.    . sennosides-docusate sodium (SENOKOT-S) 8.6-50 MG tablet Take 1 tablet by mouth 2 (two) times daily. While taking pain meds to prevent constipation 30 tablet 0  . Wheat Dextrin (BENEFIBER PO) Take by mouth.    Marland Kitchen HYDROcodone-homatropine (HYCODAN) 5-1.5 MG/5ML syrup 1-2 tsp po qhs prn cough 120 mL 0  . oxyCODONE (OXY IR/ROXICODONE) 5 MG immediate release tablet 1-2 tabs po q6h prn pain 30 tablet 0  . rosuvastatin (CRESTOR)  20 MG tablet Take 1 tablet (20 mg total) by mouth daily. (Patient not taking: Reported on 11/07/2016) 30 tablet 6   No facility-administered medications prior to visit.     Allergies  Allergen Reactions  . Codeine Nausea Only  . Dilaudid [Hydromorphone Hcl] Nausea And Vomiting    ROS As per HPI  PE: Blood pressure 122/64, pulse 81, temperature 98.2 F (36.8 C), temperature source Oral, resp. rate 16, weight 200 lb 8 oz (90.9 kg), SpO2 98 %. Gen: Alert, well appearing.  Patient is oriented to person, place, time, and situation. CV: RRR, no m/r/g.   LUNGS: CTA bilat, nonlabored resps, good aeration in all lung fields. EXT: no clubbing, cyanosis, or edema.   LABS:  Lab Results  Component Value Date   TSH 1.06 07/09/2016   Lab Results  Component Value Date   WBC 7.4 01/02/2016   HGB 12.7 01/02/2016   HCT 37.7 01/02/2016   MCV 90.8 01/02/2016   PLT 294 01/02/2016   Lab Results  Component Value Date   CREATININE 1.05 07/09/2016   BUN 12 07/09/2016   NA 139 07/09/2016   K 4.4 07/09/2016   CL 103 07/09/2016   CO2 26 07/09/2016   Lab Results  Component Value Date   ALT 30 07/09/2016   AST 21 07/09/2016   ALKPHOS 101 07/09/2016   BILITOT 0.5 07/09/2016   Lab Results  Component Value Date   CHOL 231 (H) 07/09/2016   Lab Results  Component Value Date   HDL 64.00 07/09/2016   Lab Results  Component Value Date   LDLCALC 77 07/04/2015   Lab Results  Component Value Date   TRIG (H) 07/09/2016    408.0 =Triglyceride is over 400; calculations on Lipids are invalid.   Lab Results  Component Value Date   CHOLHDL 4 07/09/2016   Lab Results  Component Value Date   HGBA1C 5.8 (H) 01/02/2016    IMPRESSION AND PLAN:  1) Chronic pain mgmt:  Doing well on current regimen. Drug contract signed today. Urine tox screen obtained today. I printed rx's for oxycodone 1-2 q6h prn, #30 today for this month, May 2018, and June 2018.  Appropriate fill on/after date was  noted on each rx.  2) HTN: The current medical regimen is effective;  continue present plan and medications. Lytes/cr today.  3) Hyperlipidemia: tolerating 80mg  atorva well. Recheck FLP today. AST/ALT normal 4 mo ago.  4) Preventative health care: prevnar 13 given today.  5) CRI stage III: cr/lytes today.  An After Visit Summary was printed and given to the patient.  FOLLOW UP: Return in about 3 months (around 02/06/2017) for f/u pain mgmt. (Of note, pt lost job recently and is self-pay.  Ask about this situation at next f/u visit)  Signed:  Crissie Sickles, MD  11/07/2016     

## 2016-11-07 NOTE — Progress Notes (Signed)
A user error has taken place: encounter opened in error, closed for administrative reasons.

## 2016-11-12 ENCOUNTER — Ambulatory Visit: Payer: Self-pay | Admitting: Family Medicine

## 2016-11-13 ENCOUNTER — Telehealth: Payer: Self-pay | Admitting: Family Medicine

## 2016-11-13 MED ORDER — DULOXETINE HCL 60 MG PO CPEP: 60.0000 mg | ORAL_CAPSULE | Freq: Every day | ORAL | 0 refills | Status: DC

## 2016-11-13 NOTE — Telephone Encounter (Signed)
Patient is out of town and left her DULoxetine (CYMBALTA) 60 MG capsule.  She is requesting to have 3 pills called in to:   [facility] [address] [place], [place]

## 2016-11-13 NOTE — Telephone Encounter (Signed)
Rx sent as requested. Pt advised and voiced understanding.   

## 2017-02-18 ENCOUNTER — Ambulatory Visit: Payer: Self-pay | Admitting: Family Medicine

## 2017-03-09 ENCOUNTER — Other Ambulatory Visit: Payer: Self-pay | Admitting: Family Medicine

## 2017-03-09 MED ORDER — OXYCODONE HCL 5 MG PO TABS: ORAL_TABLET | ORAL | 0 refills | Status: DC

## 2017-03-09 NOTE — Telephone Encounter (Signed)
Pt has apt for 04/20/17 at 9:30am is this okay?

## 2017-03-09 NOTE — Telephone Encounter (Signed)
Patient is requesting Oxycodone Rx.

## 2017-03-09 NOTE — Telephone Encounter (Signed)
Will print rx. Pt needs o/v for f/u chronic pain in 1 mo.-thx

## 2017-03-09 NOTE — Telephone Encounter (Signed)
RF request for oxycodone LOV: 11/07/16 Next ov: 04/20/17 Last written: 11/07/16 #30 w/ 0RF  Please advise. Thanks.

## 2017-04-08 ENCOUNTER — Ambulatory Visit (INDEPENDENT_AMBULATORY_CARE_PROVIDER_SITE_OTHER): Payer: Self-pay | Admitting: Family Medicine

## 2017-04-08 ENCOUNTER — Encounter: Payer: Self-pay | Admitting: Family Medicine

## 2017-04-08 VITALS — BP 128/76 | HR 75 | Temp 98.0°F | Resp 16 | Ht 62.5 in | Wt 202.5 lb

## 2017-04-08 DIAGNOSIS — M170 Bilateral primary osteoarthritis of knee: Secondary | ICD-10-CM

## 2017-04-08 DIAGNOSIS — M19041 Primary osteoarthritis, right hand: Secondary | ICD-10-CM

## 2017-04-08 DIAGNOSIS — M797 Fibromyalgia: Secondary | ICD-10-CM

## 2017-04-08 DIAGNOSIS — N183 Chronic kidney disease, stage 3 unspecified: Secondary | ICD-10-CM

## 2017-04-08 DIAGNOSIS — I10 Essential (primary) hypertension: Secondary | ICD-10-CM

## 2017-04-08 DIAGNOSIS — G894 Chronic pain syndrome: Secondary | ICD-10-CM

## 2017-04-08 DIAGNOSIS — M19042 Primary osteoarthritis, left hand: Secondary | ICD-10-CM

## 2017-04-08 LAB — BASIC METABOLIC PANEL
BUN: 13 mg/dL (ref 6–23)
CHLORIDE: 104 meq/L (ref 96–112)
CO2: 28 mEq/L (ref 19–32)
CREATININE: 1.07 mg/dL (ref 0.40–1.20)
Calcium: 9.4 mg/dL (ref 8.4–10.5)
GFR: 55.73 mL/min — ABNORMAL LOW (ref >60.00)
Glucose, Bld: 110 mg/dL — ABNORMAL HIGH (ref 70–99)
Potassium: 4.3 mEq/L (ref 3.5–5.1)
Sodium: 139 mEq/L (ref 135–145)

## 2017-04-08 MED ORDER — OXYCODONE HCL 5 MG PO TABS: ORAL_TABLET | ORAL | 0 refills | Status: DC

## 2017-04-08 MED ORDER — LOSARTAN POTASSIUM 100 MG PO TABS: 100.0000 mg | ORAL_TABLET | Freq: Every day | ORAL | 6 refills | Status: DC

## 2017-04-08 NOTE — Progress Notes (Signed)
OFFICE VISIT  04/08/2017   CC:  Chief Complaint  Patient presents with  . Follow-up    RCI, pt is fasting.    HPI:    Patient is a 61 y.o. Caucasian female who presents for f/u chronic pain syndrome, HTN, CRI stage III. Still w/out a job or insurance.  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  Chronic lumbar and bilat knee arthritis pain+fibromyalgia pain that I treat with narcotic pain med in order to maximize functioning and quality of life. Pain is largely the same regarding fibro pain--diffuse, and knees.  Lately has been having pain in some fingers in fingers--primarily PIPs and DIPs, currently right, sometimes left.  No swelling or redness of joints. Taking celebrex but not regularly.  Taking advil, though.  Tylenol as well. Indication for chronic opioid: see above. Medication and dose: oxycodone 5mg . # pills per month: 30.  Most recent rx written 03/09/17 Last UDS date: 11/07/16. Pain contract signed (Y/N): Y, 11/07/16. Date narcotic database last reviewed (include red flags): 11/07/16, no red flags (unable to access this database today).  BP:  Home monitoring: only occ check at home, pt very unclear on numbers but sounds like 732K systolic avg..  She is taking one losartin qhs.    CRI: Cr has been stable over the last 3 yrs.  See above for NSAID use.  Needs to cut back. Pays attention to good hydration habits.  ROS: no dizziness, no CP, no palps  Past Medical History:  Diagnosis Date  . Chronic renal insufficiency, stage III (moderate)    GFR 50's  . Fibromyalgia   . GERD (gastroesophageal reflux disease)   . Heart murmur   . Hemorrhoids   . History of bronchitis    childhood, wood stove and coal heat  . History of galactorrhea 02/05/2011  . Hyperlipidemia, mixed   . Hypertension   . IFG (impaired fasting glucose) 2016   HbA1c 5.7-5.8.  Marland Kitchen Knee pain, bilateral 02/05/2011  . Lumbar spondylosis   . Overweight(278.02)   . Renal mass, right 05/25/2012   Clear cell  carcinoma--localized to kidney--nephrectomy 06/2012--post-op surveillance q 1 yr as of 05/07/15 urology f/u visit (Dr. Wyatt Mage sign of recurrent dz as of this 05/2016 f/u.  Released from surveillance as of 06/03/16; return to urol prn.  . Seasonal allergic rhinitis   . Solitary left kidney 2013   Aquired: right nephrectomy for renal cancer    Past Surgical History:  Procedure Laterality Date  . CESAREAN SECTION  1983  . COLONOSCOPY  04/2013   Normal.  Repeat 2019 ([facility] of [facility]  . ROBOT ASSISTED LAPAROSCOPIC NEPHRECTOMY  07/12/2012   Procedure: ROBOTIC ASSISTED LAPAROSCOPIC NEPHRECTOMY;  Surgeon: Alexis Frock, MD;  Location: [facility];  Service: Urology;  Laterality: Right;  . TONSILLECTOMY    . TUBAL LIGATION      Outpatient Medications Prior to Visit  Medication Sig Dispense Refill  . acetaminophen (TYLENOL) 500 MG tablet Take 1,000 mg by mouth every 8 (eight) hours as needed.    Marland Kitchen albuterol (VENTOLIN HFA) 108 (90 Base) MCG/ACT inhaler Inhale 2 puffs into the lungs every 6 (six) hours as needed for wheezing or shortness of breath. 1 Inhaler 0  . atorvastatin (LIPITOR) 80 MG tablet Take 80 mg by mouth daily.    Marland Kitchen azelastine (ASTELIN) 0.1 % nasal spray     . celecoxib (CELEBREX) 200 MG capsule 1 tab po qd prn musculoskeletal pain 30 capsule 6  . cyclobenzaprine (FLEXERIL) 10 MG  tablet TAKE 1 TABLET BY MOUTH TWICE A DAY AS NEEDED 60 tablet 5  . diphenhydramine-acetaminophen (TYLENOL PM) 25-500 MG TABS Take 1 tablet by mouth at bedtime as needed.    . DULoxetine (CYMBALTA) 60 MG capsule Take 1 capsule (60 mg total) by mouth daily. 3 capsule 0  . gabapentin (NEURONTIN) 300 MG capsule TAKE 2 CAPSULES BY MOUTH THREE TIMES A DAY 540 capsule 3  . Multiple Vitamin (MULTIVITAMIN WITH MINERALS) TABS Take 1 tablet by mouth daily.    . pantoprazole (PROTONIX) 40 MG tablet Take 1 tablet (40 mg total) by mouth daily. 30 tablet 12  . promethazine (PHENERGAN) 12.5 MG tablet 1-2 tabs po q6h prn nausea 30  tablet 0  . psyllium (METAMUCIL) 58.6 % powder Take 1 packet by mouth as needed.    . sennosides-docusate sodium (SENOKOT-S) 8.6-50 MG tablet Take 1 tablet by mouth 2 (two) times daily. While taking pain meds to prevent constipation 30 tablet 0  . Wheat Dextrin (BENEFIBER PO) Take by mouth.    . losartan (COZAAR) 50 MG tablet 1-2 tabs po qd 180 tablet 3  . oxyCODONE (OXY IR/ROXICODONE) 5 MG immediate release tablet 1-2 tabs po q6h prn pain 30 tablet 0   No facility-administered medications prior to visit.     Allergies  Allergen Reactions  . Codeine Nausea Only  . Dilaudid [Hydromorphone Hcl] Nausea And Vomiting    ROS As per HPI  PE: Blood pressure 128/76, pulse 75, temperature 98 F (36.7 C), temperature source Oral, resp. rate 16, height 5' 2.5" (1.588 m), weight 202 lb 8 oz (91.9 kg), SpO2 97 %. Gen: Alert, well appearing.  Patient is oriented to person, place, time, and situation. AFFECT: pleasant, lucid thought and speech. CV: RRR, no m/r/g.   LUNGS: CTA bilat, nonlabored resps, good aeration in all lung fields. Hands: mild hypertrophy of MCPs, R>L hand.  No erythema or swelling or tenderness to palpation.  ROM intact.  N/v intact.  LABS:  Lab Results  Component Value Date   TSH 1.06 07/09/2016   Lab Results  Component Value Date   WBC 7.4 01/02/2016   HGB 12.7 01/02/2016   HCT 37.7 01/02/2016   MCV 90.8 01/02/2016   PLT 294 01/02/2016   Lab Results  Component Value Date   CREATININE 1.06 11/07/2016   BUN 13 11/07/2016   NA 139 11/07/2016   K 4.6 11/07/2016   CL 104 11/07/2016   CO2 27 11/07/2016   Lab Results  Component Value Date   ALT 30 07/09/2016   AST 21 07/09/2016   ALKPHOS 101 07/09/2016   BILITOT 0.5 07/09/2016   Lab Results  Component Value Date   CHOL 190 11/07/2016   Lab Results  Component Value Date   HDL 64.60 11/07/2016   Lab Results  Component Value Date   LDLCALC 90 11/07/2016   Lab Results  Component Value Date   TRIG  174.0 (H) 11/07/2016   Lab Results  Component Value Date   CHOLHDL 3 11/07/2016   Lab Results  Component Value Date   HGBA1C 5.8 (H) 01/02/2016    IMPRESSION AND PLAN:  1) Chronic pain syndrome: not ideally controlled.  In addition, pt is supplementing current oxycodone with advil, which I told her to stop b/c of her CRI.  I did tell her she could take her celebrex up to 3 times per week. Will increase her monthly # of oxycodone 5mg  to #45 since I'm getting her off her advil.  Instructions given today for tylenol dosing as well. I printed rx's for oxycodone 5mg , 1 q 6h prn, #45 today for this month, oct, and Nov 2018.Marland Kitchen  Appropriate fill on/after date was noted on each rx.  2) HTN; not ideal control. Asked pt to monitor home bp more often so we'll have more data to go on in order to determine med needs. Will increase losartan to 100 mg qd today.  3) CRI stage III: Stop advil.  Continue to hydrate well. BMET today.  4) Osteoarthritis of hands/fingers: unfortunately there is no additional treatment option to add for this. Topical NSAID too $$ since she has no insurance.  An After Visit Summary was printed and given to the patient.  FOLLOW UP: Return in about 3 months (around 07/08/2017) for routine chronic illness f/u.  Signed:  Crissie Sickles, MD           04/08/2017

## 2017-04-08 NOTE — Patient Instructions (Signed)
Stop advil (ibuprofen). You may take celebrex up to 3 days per week. Take tylenol 1000 mg every 6 hours as needed.

## 2017-04-20 ENCOUNTER — Ambulatory Visit: Payer: Self-pay | Admitting: Family Medicine

## 2017-05-06 ENCOUNTER — Other Ambulatory Visit: Payer: Self-pay | Admitting: *Deleted

## 2017-05-06 MED ORDER — DULOXETINE HCL 60 MG PO CPEP: 60.0000 mg | ORAL_CAPSULE | Freq: Every day | ORAL | 1 refills | Status: DC

## 2017-05-23 ENCOUNTER — Other Ambulatory Visit: Payer: Self-pay | Admitting: Family Medicine

## 2017-07-08 ENCOUNTER — Ambulatory Visit: Payer: Self-pay | Admitting: Family Medicine

## 2017-07-08 ENCOUNTER — Encounter: Payer: Self-pay | Admitting: Family Medicine

## 2017-07-08 VITALS — BP 137/79 | HR 86 | Temp 98.0°F | Resp 16 | Wt 182.0 lb

## 2017-07-08 DIAGNOSIS — M159 Polyosteoarthritis, unspecified: Secondary | ICD-10-CM

## 2017-07-08 DIAGNOSIS — I10 Essential (primary) hypertension: Secondary | ICD-10-CM

## 2017-07-08 DIAGNOSIS — M797 Fibromyalgia: Secondary | ICD-10-CM

## 2017-07-08 DIAGNOSIS — N183 Chronic kidney disease, stage 3 unspecified: Secondary | ICD-10-CM

## 2017-07-08 DIAGNOSIS — G894 Chronic pain syndrome: Secondary | ICD-10-CM

## 2017-07-08 DIAGNOSIS — M8949 Other hypertrophic osteoarthropathy, multiple sites: Secondary | ICD-10-CM

## 2017-07-08 DIAGNOSIS — M150 Primary generalized (osteo)arthritis: Secondary | ICD-10-CM

## 2017-07-08 LAB — BASIC METABOLIC PANEL
BUN: 14 mg/dL (ref 6–23)
CHLORIDE: 103 meq/L (ref 96–112)
CO2: 28 meq/L (ref 19–32)
Calcium: 9.3 mg/dL (ref 8.4–10.5)
Creatinine, Ser: 1.06 mg/dL (ref 0.40–1.20)
GFR: 56.29 mL/min — ABNORMAL LOW (ref >60.00)
GLUCOSE: 98 mg/dL (ref 70–99)
POTASSIUM: 4.3 meq/L (ref 3.5–5.1)
Sodium: 140 mEq/L (ref 135–145)

## 2017-07-08 MED ORDER — OXYCODONE HCL 5 MG PO TABS: ORAL_TABLET | ORAL | 0 refills | Status: DC
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
## 2017-08-06 ENCOUNTER — Telehealth: Payer: Self-pay | Admitting: Family Medicine

## 2017-08-06 ENCOUNTER — Other Ambulatory Visit: Payer: Self-pay | Admitting: *Deleted

## 2017-08-06 MED ORDER — GABAPENTIN 300 MG PO CAPS: ORAL_CAPSULE | ORAL | 3 refills | Status: DC

## 2017-08-06 NOTE — Telephone Encounter (Signed)
Rx refilled per protocol 

## 2017-08-06 NOTE — Telephone Encounter (Signed)
Copied from [place]. Topic: Quick Communication - Rx Refill/Question >> Aug 06, 2017  9:45 AM Synthia Innocent wrote: Medication:  gabapentin (NEURONTIN) 300 MG capsule 90 Day Supply   Has the patient contacted their pharmacy? Yes.     (Agent: If no, request that the patient contact the pharmacy for the refill.)   Preferred Pharmacy (with phone number or street name): [facility]   Agent: Please be advised that RX refills may take up to 3 business days. We ask that you follow-up with your pharmacy.

## 2017-09-17 ENCOUNTER — Encounter: Payer: Self-pay | Admitting: Family Medicine

## 2017-09-17 ENCOUNTER — Ambulatory Visit (INDEPENDENT_AMBULATORY_CARE_PROVIDER_SITE_OTHER): Payer: Self-pay | Admitting: Family Medicine

## 2017-09-17 DIAGNOSIS — M545 Low back pain, unspecified: Secondary | ICD-10-CM

## 2017-09-17 DIAGNOSIS — M542 Cervicalgia: Secondary | ICD-10-CM

## 2017-09-17 DIAGNOSIS — M549 Dorsalgia, unspecified: Secondary | ICD-10-CM

## 2017-09-17 DIAGNOSIS — M797 Fibromyalgia: Secondary | ICD-10-CM

## 2017-09-17 MED ORDER — CYCLOBENZAPRINE HCL 10 MG PO TABS: ORAL_TABLET | ORAL | 5 refills | Status: DC
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
## 2017-09-21 ENCOUNTER — Ambulatory Visit (HOSPITAL_COMMUNITY)
Admission: RE | Admit: 2017-09-21 | Discharge: 2017-09-21 | Disposition: A | Discharge status: Home / Self Care | Payer: Self-pay | Source: Ambulatory Visit | Attending: Family Medicine | Admitting: Family Medicine

## 2017-09-21 ENCOUNTER — Other Ambulatory Visit: Payer: Self-pay | Admitting: Family Medicine

## 2017-09-21 DIAGNOSIS — M5136 Other intervertebral disc degeneration, lumbar region: Secondary | ICD-10-CM | POA: Insufficient documentation

## 2017-09-21 DIAGNOSIS — M47817 Spondylosis without myelopathy or radiculopathy, lumbosacral region: Secondary | ICD-10-CM | POA: Insufficient documentation

## 2017-09-21 DIAGNOSIS — M545 Low back pain, unspecified: Secondary | ICD-10-CM

## 2017-09-21 DIAGNOSIS — M5134 Other intervertebral disc degeneration, thoracic region: Secondary | ICD-10-CM | POA: Insufficient documentation

## 2017-09-21 DIAGNOSIS — M542 Cervicalgia: Secondary | ICD-10-CM

## 2017-09-21 DIAGNOSIS — M549 Dorsalgia, unspecified: Secondary | ICD-10-CM

## 2017-09-21 DIAGNOSIS — M4184 Other forms of scoliosis, thoracic region: Secondary | ICD-10-CM | POA: Insufficient documentation

## 2017-09-21 DIAGNOSIS — M47816 Spondylosis without myelopathy or radiculopathy, lumbar region: Secondary | ICD-10-CM | POA: Insufficient documentation

## 2017-09-21 DIAGNOSIS — M4302 Spondylolysis, cervical region: Secondary | ICD-10-CM | POA: Insufficient documentation

## 2017-10-01 ENCOUNTER — Other Ambulatory Visit: Payer: Self-pay

## 2017-10-01 MED ORDER — PANTOPRAZOLE SODIUM 40 MG PO TBEC: 40.0000 mg | DELAYED_RELEASE_TABLET | Freq: Every day | ORAL | 1 refills | Status: DC

## 2017-10-02 ENCOUNTER — Encounter: Payer: Self-pay | Admitting: Family Medicine

## 2017-10-02 ENCOUNTER — Ambulatory Visit: Payer: Self-pay | Admitting: Family Medicine

## 2017-10-02 VITALS — BP 116/62 | HR 84 | Temp 98.3°F | Resp 16 | Ht 62.5 in | Wt 177.5 lb

## 2017-10-02 DIAGNOSIS — J0100 Acute maxillary sinusitis, unspecified: Secondary | ICD-10-CM

## 2017-10-02 MED ORDER — AZITHROMYCIN 250 MG PO TABS: ORAL_TABLET | ORAL | 0 refills | Status: DC
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
## 2017-10-02 NOTE — Patient Instructions (Signed)
Get otc generic robitussin DM OR Mucinex DM and use as directed on the packaging for cough and congestion. Use otc generic saline nasal spray 2-3 times per day to irrigate/moisturize your nasal passages.   

## 2017-10-05 ENCOUNTER — Other Ambulatory Visit: Payer: Self-pay | Admitting: Family Medicine

## 2017-10-05 ENCOUNTER — Ambulatory Visit: Payer: Self-pay | Admitting: Family Medicine

## 2017-10-08 ENCOUNTER — Ambulatory Visit: Payer: Self-pay | Admitting: Family Medicine

## 2017-10-08 ENCOUNTER — Encounter: Payer: Self-pay | Admitting: Family Medicine

## 2017-10-08 ENCOUNTER — Telehealth: Payer: Self-pay | Admitting: Family Medicine

## 2017-10-08 VITALS — BP 122/68 | HR 86 | Temp 97.9°F | Resp 16 | Ht 62.5 in | Wt 177.5 lb

## 2017-10-08 DIAGNOSIS — G8929 Other chronic pain: Secondary | ICD-10-CM

## 2017-10-08 DIAGNOSIS — G894 Chronic pain syndrome: Secondary | ICD-10-CM

## 2017-10-08 DIAGNOSIS — M170 Bilateral primary osteoarthritis of knee: Secondary | ICD-10-CM

## 2017-10-08 DIAGNOSIS — I10 Essential (primary) hypertension: Secondary | ICD-10-CM

## 2017-10-08 DIAGNOSIS — M545 Low back pain: Secondary | ICD-10-CM

## 2017-10-08 DIAGNOSIS — N183 Chronic kidney disease, stage 3 unspecified: Secondary | ICD-10-CM

## 2017-10-08 DIAGNOSIS — M797 Fibromyalgia: Secondary | ICD-10-CM

## 2017-10-08 LAB — BASIC METABOLIC PANEL
BUN: 24 mg/dL — AB (ref 6–23)
CALCIUM: 9.4 mg/dL (ref 8.4–10.5)
CO2: 27 meq/L (ref 19–32)
CREATININE: 1.04 mg/dL (ref 0.40–1.20)
Chloride: 102 mEq/L (ref 96–112)
GFR: 57.49 mL/min — AB (ref >60.00)
GLUCOSE: 100 mg/dL — AB (ref 70–99)
Potassium: 3.8 mEq/L (ref 3.5–5.1)
Sodium: 137 mEq/L (ref 135–145)

## 2017-10-08 MED ORDER — OXYCODONE HCL 5 MG PO TABS: ORAL_TABLET | ORAL | 0 refills | Status: DC

## 2017-10-08 MED ORDER — FLUCONAZOLE 150 MG PO TABS: 150.0000 mg | ORAL_TABLET | Freq: Once | ORAL | 1 refills | Status: AC

## 2017-10-08 NOTE — Telephone Encounter (Signed)
OK, diflucan eRx'd. 

## 2017-10-08 NOTE — Telephone Encounter (Signed)
Copied from [place] [id number]. Topic: Quick Communication - See Telephone Encounter >> Oct 08, 2017 12:12 PM Lolita Rieger, RMA wrote: CRM for notification. See Telephone encounter for: 10/08/17.pt would like diflucan called in to her pharmacy [id number] - [place], [place] [phone] (Phone)
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
## 2017-10-08 NOTE — Telephone Encounter (Signed)
This is a [employer] Patient

## 2017-10-08 NOTE — Telephone Encounter (Signed)
Patient notified and verbalized understanding. 

## 2017-11-03 ENCOUNTER — Other Ambulatory Visit: Payer: Self-pay | Admitting: Family Medicine

## 2018-01-07 ENCOUNTER — Ambulatory Visit: Payer: BLUE CROSS/BLUE SHIELD | Admitting: Family Medicine

## 2018-01-07 ENCOUNTER — Encounter: Payer: Self-pay | Admitting: Family Medicine

## 2018-01-07 VITALS — BP 120/58 | HR 89 | Resp 16 | Ht 62.5 in | Wt 166.0 lb

## 2018-01-07 DIAGNOSIS — G5603 Carpal tunnel syndrome, bilateral upper limbs: Secondary | ICD-10-CM

## 2018-01-07 DIAGNOSIS — N183 Chronic kidney disease, stage 3 unspecified: Secondary | ICD-10-CM

## 2018-01-07 DIAGNOSIS — E7800 Pure hypercholesterolemia, unspecified: Secondary | ICD-10-CM | POA: Diagnosis not present

## 2018-01-07 DIAGNOSIS — M25532 Pain in left wrist: Secondary | ICD-10-CM

## 2018-01-07 DIAGNOSIS — G894 Chronic pain syndrome: Secondary | ICD-10-CM | POA: Diagnosis not present

## 2018-01-07 DIAGNOSIS — I10 Essential (primary) hypertension: Secondary | ICD-10-CM | POA: Diagnosis not present

## 2018-01-07 DIAGNOSIS — M25531 Pain in right wrist: Secondary | ICD-10-CM

## 2018-01-07 LAB — BASIC METABOLIC PANEL
BUN: 16 mg/dL (ref 6–23)
CALCIUM: 9.7 mg/dL (ref 8.4–10.5)
CO2: 28 mEq/L (ref 19–32)
Chloride: 104 mEq/L (ref 96–112)
Creatinine, Ser: 0.99 mg/dL (ref 0.40–1.20)
GFR: 60.8 mL/min (ref >60.00)
GLUCOSE: 92 mg/dL (ref 70–99)
POTASSIUM: 4.1 meq/L (ref 3.5–5.1)
SODIUM: 140 meq/L (ref 135–145)

## 2018-01-07 NOTE — Progress Notes (Signed)
OFFICE VISIT  01/07/2018   CC:  Chief Complaint  Patient presents with  . Follow-up    RCI     HPI:    Patient is a 62 y.o. Caucasian female who presents for 3 mo f/u chronic pain syndrome, HTN, HLD, CRI II/III (GFR upper 50s). Feeling well!  Indication for chronic opioid: chronic lumbar and bilat knee osteoarthritis + fibromyalgia pain that I treat with narcotic pain meds in order to maximize functioning and quality of life. Medication and dose: oxycodone 5mg , 1-2 q6 prn # pills per month: 60 Last UDS date: 11/07/16 Opioid Treatment Agreement signed (Y/N): 11/07/16 Opioid Treatment Agreement last reviewed with patient:  Today. [facility] reviewed this encounter (include red flags):  Today, no red flags.  Working harder on TLC and has lost 11 lbs since last visit here.   She says her pain is much improved and she wants to get off the narcotic pain med. She had too much apathy and too much desire to sleep when taking oxycodone regularly. She is walking by faith and trusting in the [place]. No withdrawal s'xs and she has had not oxycodone in 7d.  HTN: consistently 120s/60s.  No formal exercise.  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 This is getting worse, wakes her up at night.  Has worn wrist splints at night and day and sx's persist. She has never seen a specialist for this.  ROS: no HAs, no palpitations, no SOB, no CP, no dizziness, no joint swelling or redness, no rash.  No tremors.  Past Medical History:  Diagnosis Date  . Chronic renal insufficiency, stage III (moderate) (HCC)    GFR 50's  . Fibromyalgia   . GERD (gastroesophageal reflux disease)   . Heart murmur   . Hemorrhoids   . History of bronchitis    childhood, wood stove and coal heat  . History of galactorrhea 02/05/2011  . Hyperlipidemia, mixed   . Hypertension   . IFG (impaired fasting glucose) 2016   HbA1c 5.7-5.8.  Marland Kitchen Knee pain, bilateral 02/05/2011  .  Lumbar spondylosis   . Overweight(278.02)   . 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 Wyatt Mage sign of recurrent dz as of this 05/2016 f/u.  Released from surveillance as of 06/03/16; return to urol prn.  . Seasonal allergic rhinitis   . Solitary left kidney 2013   Aquired: right nephrectomy for renal cancer    Past Surgical History:  Procedure Laterality Date  . CESAREAN SECTION  1983  . COLONOSCOPY  04/2013   Normal.  Repeat 2019 ([facility]  . ROBOT ASSISTED LAPAROSCOPIC NEPHRECTOMY  07/12/2012   Procedure: ROBOTIC ASSISTED LAPAROSCOPIC NEPHRECTOMY;  Surgeon: Alexis Frock, MD;  Location: [facility];  Service: Urology;  Laterality: Right;  . TONSILLECTOMY    . TUBAL LIGATION      Outpatient Medications Prior to Visit  Medication Sig Dispense Refill  . acetaminophen (TYLENOL) 500 MG tablet Take 1,000 mg by mouth every 8 (eight) hours as needed.    Marland Kitchen atorvastatin (LIPITOR) 80 MG tablet TAKE 1 TABLET (80 MG TOTAL) BY MOUTH DAILY. 90 tablet 1  . azelastine (ASTELIN) 0.1 % nasal spray     . cyclobenzaprine (FLEXERIL) 10 MG tablet 1 tab po tid prn 90 tablet 5  . DULoxetine (CYMBALTA) 60 MG capsule TAKE 1 CAPSULE BY MOUTH EVERY DAY 90  capsule 1  . gabapentin (NEURONTIN) 300 MG capsule TAKE 2 CAPSULES BY MOUTH THREE TIMES A DAY 540 capsule 3  . losartan (COZAAR) 100 MG tablet TAKE 1 TABLET BY MOUTH EVERY DAY 30 tablet 5  . OVER THE COUNTER MEDICATION Slimvance - for weight loss    . pantoprazole (PROTONIX) 40 MG tablet Take 1 tablet (40 mg total) by mouth daily. 90 tablet 1  . promethazine (PHENERGAN) 12.5 MG tablet 1-2 tabs po q6h prn nausea 30 tablet 0  . psyllium (METAMUCIL) 58.6 % powder Take 1 packet by mouth as needed.    . sennosides-docusate sodium (SENOKOT-S) 8.6-50 MG tablet Take 1 tablet by mouth 2 (two) times daily. While taking pain meds to prevent  constipation 30 tablet 0  . Wheat Dextrin (BENEFIBER PO) Take by mouth.    . oxyCODONE (OXY IR/ROXICODONE) 5 MG immediate release tablet 1-2 tabs po q6h prn pain 60 tablet 0  . albuterol (VENTOLIN HFA) 108 (90 Base) MCG/ACT inhaler Inhale 2 puffs into the lungs every 6 (six) hours as needed for wheezing or shortness of breath. (Patient not taking: Reported on 01/07/2018) 1 Inhaler 0  . diphenhydramine-acetaminophen (TYLENOL PM) 25-500 MG TABS Take 1 tablet by mouth at bedtime as needed.    . Multiple Vitamin (MULTIVITAMIN WITH MINERALS) TABS Take 1 tablet by mouth daily.     No facility-administered medications prior to visit.     Allergies  Allergen Reactions  . Codeine Nausea Only  . Dilaudid [Hydromorphone Hcl] Nausea And Vomiting    ROS As per HPI  PE: Blood pressure (!) 120/58, pulse 89, resp. rate 16, height 5' 2.5" (1.588 m), weight 166 lb (75.3 kg), SpO2 98 %. Gen: Alert, well appearing.  Patient is oriented to person, place, time, and situation. AFFECT: pleasant, lucid thought and speech. CV: RRR, no m/r/g.   LUNGS: CTA bilat, nonlabored resps, good aeration in all lung fields. EXT: no clubbing, cyanosis, or edema.  Wrists: nontender, without swelling or erythema or warmth.  ROM intact.  NV intact. No muscle wasting in hands. Phalen's test + on R>L Tinel's neg bilat.  Grip strength normal bilat.  LABS:  Lab Results  Component Value Date   TSH 1.06 07/09/2016   Lab Results  Component Value Date   WBC 7.4 01/02/2016   HGB 12.7 01/02/2016   HCT 37.7 01/02/2016   MCV 90.8 01/02/2016   PLT 294 01/02/2016   Lab Results  Component Value Date   CREATININE 1.04 10/08/2017   BUN 24 (H) 10/08/2017   NA 137 10/08/2017   K 3.8 10/08/2017   CL 102 10/08/2017   CO2 27 10/08/2017   Lab Results  Component Value Date   ALT 30 07/09/2016   AST 21 07/09/2016   ALKPHOS 101 07/09/2016   BILITOT 0.5 07/09/2016   Lab Results  Component Value Date   CHOL 190 11/07/2016    Lab Results  Component Value Date   HDL 64.60 11/07/2016   Lab Results  Component Value Date   LDLCALC 90 11/07/2016   Lab Results  Component Value Date   TRIG 174.0 (H) 11/07/2016   Lab Results  Component Value Date   CHOLHDL 3 11/07/2016   Lab Results  Component Value Date   HGBA1C 5.8 (H) 01/02/2016    IMPRESSION AND PLAN:  1) Chronic pain syndrome: osteoarthritis in back and knees and fibromyalgia. She has gone 7d w/out oxycodone and feels no w/drawal sx's. She wants to stay OFF this med for  now---no new rx's given today. We'll re-evaluate this in 3 mo at her CPE.  2) HTN: The current medical regimen is effective;  continue present plan and medications. Lytes/cr today.  3) CRI with GFR upper 50s: avoid NSAIDS, hydrate well. Lytes/cr today.  4) Bilat CTS, R>L-->pt strongly requested a referral to orthopedist today "to get this taken care of" b/c she is suffering a lot.  She has failed conservative tx.   5) Tolerating high dose atorvastatin.  Will repeat FLP at next CPE in 3 mo.  An After Visit Summary was printed and given to the patient.  FOLLOW UP: Return in about 3 months (around 04/09/2018) for annual CPE (fasting).  Signed:  Crissie Sickles, MD           01/07/2018

## 2018-01-08 ENCOUNTER — Telehealth: Payer: Self-pay | Admitting: Family Medicine

## 2018-01-08 MED ORDER — MELOXICAM 15 MG PO TABS: ORAL_TABLET | ORAL | 2 refills | Status: DC

## 2018-01-08 NOTE — Telephone Encounter (Signed)
Pt called in and stated that she would like to know if Dr could give her something for pain.  She stated she is off the opioids and would like to see if he would be willing to call in Naproxen  Pharmacy - [facility]    Please advise. Thanks.

## 2018-01-08 NOTE — Telephone Encounter (Unsigned)
Copied from [place] [id number]. Topic: Quick Communication - See Telephone Encounter >> Jan 08, 2018 11:33 AM Percell Belt A wrote: CRM for notification. See Telephone encounter for: 01/08/18.  Pt called in and stated that she would like to know if Dr could give her something for pain.  She stated she is off the opioids and would like to see if he would be willing to call in Naproxen  Pharmacy - [facility]

## 2018-01-08 NOTE — Telephone Encounter (Signed)
Left detailed message on pt's cell.  Okay per DPR. 

## 2018-01-08 NOTE — Telephone Encounter (Signed)
I'll rx once a day meloxicam, but remind her that meds like this and naproxen and ibuprofen can potentially cause kidney damage when used regularly, so with her having only 1 kidney we'll have to keep kidney function every 3 months and if kidney function is decreasing then we'll have to stop these meds.-thx

## 2018-01-11 ENCOUNTER — Other Ambulatory Visit: Payer: Self-pay

## 2018-01-11 ENCOUNTER — Ambulatory Visit: Payer: BLUE CROSS/BLUE SHIELD | Admitting: Family Medicine

## 2018-01-11 ENCOUNTER — Encounter: Payer: Self-pay | Admitting: Family Medicine

## 2018-01-11 ENCOUNTER — Encounter: Payer: Self-pay | Admitting: Emergency Medicine

## 2018-01-11 VITALS — BP 142/88 | HR 79 | Temp 98.4°F | Ht 62.5 in | Wt 166.6 lb

## 2018-01-11 DIAGNOSIS — J0101 Acute recurrent maxillary sinusitis: Secondary | ICD-10-CM | POA: Diagnosis not present

## 2018-01-11 MED ORDER — FLUCONAZOLE 150 MG PO TABS: ORAL_TABLET | ORAL | 0 refills | Status: DC

## 2018-01-11 MED ORDER — AMOXICILLIN-POT CLAVULANATE 875-125 MG PO TABS: 1.0000 | ORAL_TABLET | Freq: Two times a day (BID) | ORAL | 0 refills | Status: DC

## 2018-01-11 NOTE — Progress Notes (Signed)
Subjective   CC:  Chief Complaint  Patient presents with  . Sinus Problem    headache and presssure x 1 month, was treated with Z-Pac and symptoms keep returning    Same date appt; Pcp not available.  HPI: [name] is a 62 y.o. female who presents to the office today to address the problems listed above in the chief complaint.  Patient reports sinus congestion and pressure with thick drainage, mild nonproductive cough, ear pressure without pain, and mild malaise.  Symptoms have been present for several weeks.Shedenies high fevers, GI symptoms, shortness of breath. Shehas had sinus infections in the past and this feels similar.  Patient is not a non-smoker.  No history of asthma or COPD.  Last treated for same march 22 with zpak. Records reviewed. sxs are similar. She had stopped taking her zyrtec for allergies; now back on it.   I reviewed the patients updated PMH, FH, and SocHx.    Patient Active Problem List   Diagnosis Date Noted  . Chronic pain syndrome 01/07/2018  . Chronic renal insufficiency, stage III (moderate) ([place]) 01/02/2016  . Maxillary sinusitis 05/16/2015  . Hypotension due to drugs 06/27/2014  . Argyll Robertson pupil 06/27/2014  . Pain in lower jaw 11/23/2013  . Osteoarthritis, knee 11/23/2013  . Back pain 11/23/2013  . Knee osteoarthritis 11/14/2013  . Pes anserinus bursitis of both knees 09/26/2013  . Gout of big toe 09/26/2013  . Family history of colon cancer-father 33 04/25/2013  . Hyperlipemia, mixed 11/08/2012  . Fibromyalgia syndrome 09/15/2012  . Health maintenance examination 05/26/2012  . Musculoskeletal pain 01/04/2012  . HTN (hypertension), benign 10/08/2011  . Hyperlipidemia 03/05/2011  . History of galactorrhea 02/05/2011  . Knee pain, bilateral 02/05/2011  . Preventative health care 02/05/2011  . Allergic state   . GERD (gastroesophageal reflux disease)   . Overweight(278.02)    Current Meds  Medication Sig  . acetaminophen  (TYLENOL) 500 MG tablet Take 1,000 mg by mouth every 8 (eight) hours as needed.  Marland Kitchen albuterol (VENTOLIN HFA) 108 (90 Base) MCG/ACT inhaler Inhale 2 puffs into the lungs every 6 (six) hours as needed for wheezing or shortness of breath.  Marland Kitchen atorvastatin (LIPITOR) 80 MG tablet TAKE 1 TABLET (80 MG TOTAL) BY MOUTH DAILY.  Marland Kitchen azelastine (ASTELIN) 0.1 % nasal spray   . cyclobenzaprine (FLEXERIL) 10 MG tablet 1 tab po tid prn  . diphenhydramine-acetaminophen (TYLENOL PM) 25-500 MG TABS Take 1 tablet by mouth at bedtime as needed.  . DULoxetine (CYMBALTA) 60 MG capsule TAKE 1 CAPSULE BY MOUTH EVERY DAY  . gabapentin (NEURONTIN) 300 MG capsule TAKE 2 CAPSULES BY MOUTH THREE TIMES A DAY  . losartan (COZAAR) 100 MG tablet TAKE 1 TABLET BY MOUTH EVERY DAY  . meloxicam (MOBIC) 15 MG tablet 1 tab po qd prn pain  . Multiple Vitamin (MULTIVITAMIN WITH MINERALS) TABS Take 1 tablet by mouth daily.  Marland Kitchen OVER THE COUNTER MEDICATION Slimvance - for weight loss  . pantoprazole (PROTONIX) 40 MG tablet Take 1 tablet (40 mg total) by mouth daily.  . promethazine (PHENERGAN) 12.5 MG tablet 1-2 tabs po q6h prn nausea  . psyllium (METAMUCIL) 58.6 % powder Take 1 packet by mouth as needed.  . sennosides-docusate sodium (SENOKOT-S) 8.6-50 MG tablet Take 1 tablet by mouth 2 (two) times daily. While taking pain meds to prevent constipation  . Wheat Dextrin (BENEFIBER PO) Take by mouth.    Review of Systems: Cardiovascular: negative for chest pain Respiratory:  negative for SOB or persistent cough Gastrointestinal: negative for abdominal pain Genitourinary: negative for dysuria or gross hematuria  Objective  Vitals: BP (!) 142/88   Pulse 79   Temp 98.4 F (36.9 C)   Ht 5' 2.5" (1.588 m)   SpO2 97%   BMI 29.88 kg/m  General: no acute distress  Psych:  Alert and oriented, normal mood and affect HEENT:  Normocephalic, atraumatic, TMs with serous effusions or retraction w/o erythema, nasal mucosa is red with purulent  drainage, tender maxillary sinus present, OP mild erythematous w/o eudate, supple neck with LAD Cardiovascular:  RRR without murmur or gallop. no peripheral edema Respiratory:  Good breath sounds bilaterally, CTAB with normal respiratory effort Skin:  Warm, no rashes Neurologic:   Mental status is normal. normal gait  Assessment  1. Acute recurrent maxillary sinusitis      Plan    Sinusitis: History and exam is most consistent with bacterial sinus infection.  Etiology and prognosis discussed with patient.  Recommend antibiotics as ordered below.  Patient to complete course of antibiotics, use supportive medications like mucolytics and decongestants as needed.  May use Tylenol or Advil if needed.  Symptoms should improve over the next 2 weeks.  Patient will return or call if symptoms persist or worsen.  Follow up: Return if symptoms worsen or fail to improve.    Commons side effects, risks, benefits, and alternatives for medications and treatment plan prescribed today were discussed, and the patient expressed understanding of the given instructions. Patient is instructed to call or message via MyChart if he/she has any questions or concerns regarding our treatment plan. No barriers to understanding were identified. We discussed Red Flag symptoms and signs in detail. Patient expressed understanding regarding what to do in case of urgent or emergency type symptoms.   Medication list was reconciled, printed and provided to the patient in AVS. Patient instructions and summary information was reviewed with the patient as documented in the AVS. This note was prepared with assistance of Dragon voice recognition software. Occasional wrong-word or sound-a-like substitutions may have occurred due to the inherent limitations of voice recognition software  No orders of the defined types were placed in this encounter.  Meds ordered this encounter  Medications  . amoxicillin-clavulanate (AUGMENTIN)  875-125 MG tablet    Sig: Take 1 tablet by mouth 2 (two) times daily.    Dispense:  14 tablet    Refill:  0  . fluconazole (DIFLUCAN) 150 MG tablet    Sig: Take one tablet today; may repeat in 3 days if symptoms persist    Dispense:  2 tablet    Refill:  0

## 2018-01-11 NOTE — Patient Instructions (Signed)
Please follow up if symptoms do not improve or as needed.    Sinusitis, Adult Sinusitis is soreness and inflammation of your sinuses. Sinuses are hollow spaces in the bones around your face. They are located:  Around your eyes.  In the middle of your forehead.  Behind your nose.  In your cheekbones.  Your sinuses and nasal passages are lined with a stringy fluid (mucus). Mucus normally drains out of your sinuses. When your nasal tissues get inflamed or swollen, the mucus can get trapped or blocked so air cannot flow through your sinuses. This lets bacteria, viruses, and funguses grow, and that leads to infection. Follow these instructions at home: Medicines  Take, use, or apply over-the-counter and prescription medicines only as told by your doctor. These may include nasal sprays.  If you were prescribed an antibiotic medicine, take it as told by your doctor. Do not stop taking the antibiotic even if you start to feel better. Hydrate and Humidify  Drink enough water to keep your pee (urine) clear or pale yellow.  Use a cool mist humidifier to keep the humidity level in your home above 50%.  Breathe in steam for 10-15 minutes, 3-4 times a day or as told by your doctor. You can do this in the bathroom while a hot shower is running.  Try not to spend time in cool or dry air. Rest  Rest as much as possible.  Sleep with your head raised (elevated).  Make sure to get enough sleep each night. General instructions  Put a warm, moist washcloth on your face 3-4 times a day or as told by your doctor. This will help with discomfort.  Wash your hands often with soap and water. If there is no soap and water, use hand sanitizer.  Do not smoke. Avoid being around people who are smoking (secondhand smoke).  Keep all follow-up visits as told by your doctor. This is important. Contact a doctor if:  You have a fever.  Your symptoms get worse.  Your symptoms do not get better within 10  days. Get help right away if:  You have a very bad headache.  You cannot stop throwing up (vomiting).  You have pain or swelling around your face or eyes.  You have trouble seeing.  You feel confused.  Your neck is stiff.  You have trouble breathing. This information is not intended to replace advice given to you by your health care provider. Make sure you discuss any questions you have with your health care provider. Document Released: 12/17/2007 Document Revised: 02/24/2016 Document Reviewed: 04/25/2015 Elsevier Interactive Patient Education  2018 [employer] Inc.   

## 2018-02-01 ENCOUNTER — Telehealth: Payer: Self-pay | Admitting: Family Medicine

## 2018-02-01 NOTE — Telephone Encounter (Signed)
LM for patient to CB to follow up on referral to [facility]. [facility] marked referral declined but did not mark whether it was declined by their office or by the patient.

## 2018-03-05 ENCOUNTER — Other Ambulatory Visit: Payer: Self-pay | Admitting: Family Medicine

## 2018-03-26 ENCOUNTER — Ambulatory Visit: Payer: BLUE CROSS/BLUE SHIELD | Admitting: Family Medicine

## 2018-03-26 ENCOUNTER — Encounter: Payer: Self-pay | Admitting: Family Medicine

## 2018-03-26 ENCOUNTER — Ambulatory Visit (INDEPENDENT_AMBULATORY_CARE_PROVIDER_SITE_OTHER)
Admission: RE | Admit: 2018-03-26 | Discharge: 2018-03-26 | Disposition: A | Discharge status: Home / Self Care | Payer: BLUE CROSS/BLUE SHIELD | Source: Ambulatory Visit | Attending: Family Medicine | Admitting: Family Medicine

## 2018-03-26 VITALS — BP 126/72 | HR 81 | Resp 16 | Ht 62.5 in | Wt 167.0 lb

## 2018-03-26 DIAGNOSIS — Z23 Encounter for immunization: Secondary | ICD-10-CM | POA: Diagnosis not present

## 2018-03-26 DIAGNOSIS — M25571 Pain in right ankle and joints of right foot: Secondary | ICD-10-CM | POA: Diagnosis not present

## 2018-03-26 DIAGNOSIS — S9001XA Contusion of right ankle, initial encounter: Secondary | ICD-10-CM

## 2018-03-26 MED ORDER — HYDROCODONE-ACETAMINOPHEN 5-325 MG PO TABS: 1.0000 | ORAL_TABLET | Freq: Four times a day (QID) | ORAL | 0 refills | Status: DC | PRN

## 2018-03-26 NOTE — Progress Notes (Signed)
OFFICE VISIT  03/26/2018   CC:  Chief Complaint  Patient presents with  . Ankle Pain    right ankle injury 03/05/18   HPI:    Patient is a 62 y.o. Caucasian female who presents for right ankle pain. A shopping cart hit her R ankle lateral aspect about 3 wks ago.  NO twist or bend injury to ankle. It swelled up and hurt, shoe rubs it.  She gets to rest it a couple days then has to work on her feet 12 hour shifts for several days in a row.  Takes 600mg  advil alt with 1000 mg tylenol.  Icy hot applied.  Lidocaine gel applied. Hurts constantly but much worse when bearing wt. It has improved overall but length of time it is taking to get significantly better has her concerned.    Past Medical History:  Diagnosis Date  . Chronic renal insufficiency, stage III (moderate) (HCC)    GFR 50's  . Fibromyalgia   . GERD (gastroesophageal reflux disease)   . Heart murmur   . Hemorrhoids   . History of bronchitis    childhood, wood stove and coal heat  . History of galactorrhea 02/05/2011  . Hyperlipidemia, mixed   . Hypertension   . IFG (impaired fasting glucose) 2016   HbA1c 5.7-5.8.  Marland Kitchen Knee pain, bilateral 02/05/2011  . Lumbar spondylosis   . Overweight(278.02)   . 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 Wyatt Mage sign of recurrent dz as of this 05/2016 f/u.  Released from surveillance as of 06/03/16; return to urol prn.  . Seasonal allergic rhinitis   . Solitary left kidney 2013   Aquired: right nephrectomy for renal cancer    Past Surgical History:  Procedure Laterality Date  . CESAREAN SECTION  1983  . COLONOSCOPY  04/2013   Normal.  Repeat 2019 ([facility]  . ROBOT ASSISTED LAPAROSCOPIC NEPHRECTOMY  07/12/2012   Procedure: ROBOTIC ASSISTED LAPAROSCOPIC NEPHRECTOMY;  Surgeon: Alexis Frock, MD;  Location: [facility];  Service: Urology;  Laterality: Right;  .  TONSILLECTOMY    . TUBAL LIGATION      Outpatient Medications Prior to Visit  Medication Sig Dispense Refill  . acetaminophen (TYLENOL) 500 MG tablet Take 1,000 mg by mouth every 8 (eight) hours as needed.    Marland Kitchen albuterol (VENTOLIN HFA) 108 (90 Base) MCG/ACT inhaler Inhale 2 puffs into the lungs every 6 (six) hours as needed for wheezing or shortness of breath. 1 Inhaler 0  . atorvastatin (LIPITOR) 80 MG tablet TAKE 1 TABLET (80 MG TOTAL) BY MOUTH DAILY. 90 tablet 1  . azelastine (ASTELIN) 0.1 % nasal spray     . cyclobenzaprine (FLEXERIL) 10 MG tablet 1 tab po tid prn 90 tablet 5  . diphenhydramine-acetaminophen (TYLENOL PM) 25-500 MG TABS Take 1 tablet by mouth at bedtime as needed.    . DULoxetine (CYMBALTA) 60 MG capsule TAKE 1 CAPSULE BY MOUTH EVERY DAY 90 capsule 1  . gabapentin (NEURONTIN) 300 MG capsule TAKE 2 CAPSULES BY MOUTH THREE TIMES A DAY 540 capsule 3  . losartan (COZAAR) 100 MG tablet TAKE 1 TABLET BY MOUTH EVERY DAY 30 tablet 5  . meloxicam (MOBIC) 15 MG tablet 1 tab po qd prn pain 30 tablet 2  . Multiple Vitamin (MULTIVITAMIN WITH MINERALS) TABS Take 1 tablet by mouth daily.    Marland Kitchen OVER THE COUNTER MEDICATION Slimvance - for weight  loss    . pantoprazole (PROTONIX) 40 MG tablet Take 1 tablet (40 mg total) by mouth daily. 90 tablet 1  . promethazine (PHENERGAN) 12.5 MG tablet 1-2 tabs po q6h prn nausea 30 tablet 0  . psyllium (METAMUCIL) 58.6 % powder Take 1 packet by mouth as needed.    . sennosides-docusate sodium (SENOKOT-S) 8.6-50 MG tablet Take 1 tablet by mouth 2 (two) times daily. While taking pain meds to prevent constipation 30 tablet 0  . Wheat Dextrin (BENEFIBER PO) Take by mouth.    Marland Kitchen amoxicillin-clavulanate (AUGMENTIN) 875-125 MG tablet Take 1 tablet by mouth 2 (two) times daily. (Patient not taking: Reported on 03/26/2018) 14 tablet 0  . fluconazole (DIFLUCAN) 150 MG tablet Take one tablet today; may repeat in 3 days if symptoms persist (Patient not taking:  Reported on 03/26/2018) 2 tablet 0   No facility-administered medications prior to visit.     Allergies  Allergen Reactions  . Codeine Nausea Only  . Dilaudid [Hydromorphone Hcl] Nausea And Vomiting    ROS As per HPI  PE: Blood pressure 126/72, pulse 81, resp. rate 16, height 5' 2.5" (1.588 m), weight 167 lb (75.8 kg), SpO2 98 %. Gen: Alert, well appearing.  Patient is oriented to person, place, time, and situation. AFFECT: pleasant, lucid thought and speech. R ankle with diffuse STS anterolaterally--mild/mod. No erythema or bruising.  She has a palpable hematoma at the region of the distal fibula about 5-6 cm proximal to the lat malleolus.  ROM of ankle limited due to pain.  NV intact. Tender to palpation all over lateral aspect of R ankle, otherwise nontender LL and foot.  LABS:    Chemistry      Component Value Date/Time   NA 140 01/07/2018 1116   K 4.1 01/07/2018 1116   CL 104 01/07/2018 1116   CO2 28 01/07/2018 1116   BUN 16 01/07/2018 1116   CREATININE 0.99 01/07/2018 1116   CREATININE 1.09 (H) 01/02/2016 1602      Component Value Date/Time   CALCIUM 9.7 01/07/2018 1116   ALKPHOS 101 07/09/2016 1020   AST 21 07/09/2016 1020   ALT 30 07/09/2016 1020   BILITOT 0.5 07/09/2016 1020      IMPRESSION AND PLAN:  Right ankle contusion/pain; acute. Mild improvement per pt over the last 3 wks. Continue ibup 600mg  bid. Vicodin 5/325, 1-2 q6h prn, #30. Walking boot rx given today to patient. Continue ice qd. Xray R ankle today. May need PT if no fx.  An After Visit Summary was printed and given to the patient.  FOLLOW UP: Return in about 2 weeks (around 04/09/2018) for f/u ankle pain.  Signed:  Crissie Sickles, MD           03/26/2018

## 2018-03-31 ENCOUNTER — Encounter: Payer: Self-pay | Admitting: Family Medicine

## 2018-03-31 ENCOUNTER — Ambulatory Visit: Payer: BLUE CROSS/BLUE SHIELD | Admitting: Family Medicine

## 2018-03-31 VITALS — BP 101/63 | HR 110 | Resp 16 | Ht 62.5 in | Wt 170.0 lb

## 2018-03-31 DIAGNOSIS — M797 Fibromyalgia: Secondary | ICD-10-CM

## 2018-03-31 MED ORDER — OXYCODONE HCL 5 MG PO TABS: ORAL_TABLET | ORAL | 0 refills | Status: DC

## 2018-03-31 NOTE — Progress Notes (Signed)
OFFICE VISIT  03/31/2018   CC:  Chief Complaint  Patient presents with  . Back Pain    shoulder pain   HPI:    Patient is a 62 y.o. Caucasian female who presents for upper back and shoulder pain as well as chronic pain syndrome.  Up until about 3 mo ago I was managing her chronic lumbar and bilatknee osteoarthritis + fibromyalgia pain that I treated with narcotic pain medsin order to maximize functioning and quality of life. Last visit she had weened herself off oxycodone and wanted to try to do w/out any narcotics.  I did recently rx vicodin 5/325, #30 for short term pain tx for R ankle contusion.  "My back went out on me".  Felt the beginning of a strain a week ago, then 4 d/a her husband tried to pop her back and this hurt.  Pain subsided.  Next day she could not go to work due to the pain.  Pain located mainly along bra line at mid T spine, but also backs of both shoulders/shoulder blades.  Biofreeze and heating pad use. Had a brief period of radiation into L hip.  No radiation down arms.  None down her legs.  No weakness in extremities, no numbness or tingling.  Took flexeril but no meloxicam.  Took vicodin 5/325 1 tab 3-4 times per day.  This does help it some. 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  Past Medical History:  Diagnosis Date  . Chronic renal insufficiency, stage III (moderate) (HCC)    GFR 50's  . Fibromyalgia   . GERD (gastroesophageal reflux disease)   . Heart murmur   . Hemorrhoids   . History of bronchitis    childhood, wood stove and coal heat  . History of galactorrhea 02/05/2011  . Hyperlipidemia, mixed   . Hypertension   . IFG (impaired fasting glucose) 2016   HbA1c 5.7-5.8.  Marland Kitchen Knee pain, bilateral 02/05/2011  . Lumbar spondylosis   . Overweight(278.02)   . 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 Wyatt Mage sign of recurrent dz as of this 05/2016 f/u.  Released from surveillance as of 06/03/16; return to urol prn.  . Seasonal allergic rhinitis   . Solitary left kidney 2013   Aquired: right nephrectomy for renal cancer    Past Surgical History:  Procedure Laterality Date  . CESAREAN SECTION  1983  . COLONOSCOPY  04/2013   Normal.  Repeat 2019 ([facility]  . ROBOT ASSISTED LAPAROSCOPIC NEPHRECTOMY  07/12/2012   Procedure: ROBOTIC ASSISTED LAPAROSCOPIC NEPHRECTOMY;  Surgeon: Alexis Frock, MD;  Location: [facility];  Service: Urology;  Laterality: Right;  . TONSILLECTOMY    . TUBAL LIGATION      Outpatient Medications Prior to Visit  Medication Sig Dispense Refill  . acetaminophen (TYLENOL) 500 MG tablet Take 1,000 mg by mouth every 8 (eight) hours as needed.    Marland Kitchen albuterol (VENTOLIN HFA) 108 (90 Base) MCG/ACT inhaler Inhale 2 puffs into the lungs every 6 (six) hours as needed for wheezing or shortness of breath. 1 Inhaler 0  . atorvastatin (LIPITOR) 80 MG tablet TAKE 1 TABLET (80 MG TOTAL) BY MOUTH DAILY. 90 tablet 1  . azelastine (ASTELIN) 0.1 % nasal spray     . cyclobenzaprine (FLEXERIL) 10 MG tablet 1 tab po tid prn 90 tablet 5  .  diphenhydramine-acetaminophen (TYLENOL PM) 25-500 MG TABS Take 1 tablet by mouth at bedtime as needed.    . DULoxetine (CYMBALTA) 60 MG capsule TAKE 1 CAPSULE BY MOUTH EVERY DAY 90 capsule 1  . gabapentin (NEURONTIN) 300 MG capsule TAKE 2 CAPSULES BY MOUTH THREE TIMES A DAY 540 capsule 3  . losartan (COZAAR) 100 MG tablet TAKE 1 TABLET BY MOUTH EVERY DAY 30 tablet 5  . meloxicam (MOBIC) 15 MG tablet 1 tab po qd prn pain 30 tablet 2  . Multiple Vitamin (MULTIVITAMIN WITH MINERALS) TABS Take 1 tablet by mouth daily.    Marland Kitchen OVER THE COUNTER MEDICATION Slimvance - for weight loss    . pantoprazole (PROTONIX) 40 MG tablet Take 1 tablet (40 mg total) by mouth daily. 90 tablet 1  . promethazine (PHENERGAN) 12.5 MG tablet 1-2 tabs  po q6h prn nausea 30 tablet 0  . psyllium (METAMUCIL) 58.6 % powder Take 1 packet by mouth as needed.    . sennosides-docusate sodium (SENOKOT-S) 8.6-50 MG tablet Take 1 tablet by mouth 2 (two) times daily. While taking pain meds to prevent constipation 30 tablet 0  . Wheat Dextrin (BENEFIBER PO) Take by mouth.    Marland Kitchen HYDROcodone-acetaminophen (NORCO/VICODIN) 5-325 MG tablet Take 1-2 tablets by mouth every 6 (six) hours as needed for moderate pain. 30 tablet 0  . amoxicillin-clavulanate (AUGMENTIN) 875-125 MG tablet Take 1 tablet by mouth 2 (two) times daily. (Patient not taking: Reported on 03/26/2018) 14 tablet 0  . fluconazole (DIFLUCAN) 150 MG tablet Take one tablet today; may repeat in 3 days if symptoms persist (Patient not taking: Reported on 03/26/2018) 2 tablet 0   No facility-administered medications prior to visit.     Allergies  Allergen Reactions  . Codeine Nausea Only  . Dilaudid [Hydromorphone Hcl] Nausea And Vomiting    ROS As per HPI  PE: Blood pressure 101/63, pulse (!) 110, resp. rate 16, height 5' 2.5" (1.588 m), weight 170 lb (77.1 kg), SpO2 96 %. Gen: Alert, well appearing.  Patient is oriented to person, place, time, and situation. AFFECT: pleasant, lucid thought and speech. She has tenderness to shallow palpation from her neck down to her low back.  No lateral hip tenderness.  No leg tenderness.  UE strength 5/5 prox/dist.  LABS:    Chemistry      Component Value Date/Time   NA 140 01/07/2018 1116   K 4.1 01/07/2018 1116   CL 104 01/07/2018 1116   CO2 28 01/07/2018 1116   BUN 16 01/07/2018 1116   CREATININE 0.99 01/07/2018 1116   CREATININE 1.09 (H) 01/02/2016 1602      Component Value Date/Time   CALCIUM 9.7 01/07/2018 1116   ALKPHOS 101 07/09/2016 1020   AST 21 07/09/2016 1020   ALT 30 07/09/2016 1020   BILITOT 0.5 07/09/2016 1020       IMPRESSION AND PLAN:  Fibromyalgia: flare due to excessive physical labor at work lately. We discussed  getting her back on her pain med regimen (oxycodone 5mg ) and she agreed that she probably should do this. Oxycodone 5mg , 1 q6h prn, #60 eRx'd today. Take meloxicam 15mg  qd x 10d. Continue flexeril and neurontin.  An After Visit Summary was printed and given to the patient.  FOLLOW UP: Return for keep appt scheduled already.  Signed:  Crissie Sickles, MD           03/31/2018

## 2018-04-08 ENCOUNTER — Encounter: Payer: BLUE CROSS/BLUE SHIELD | Admitting: Family Medicine

## 2018-04-16 ENCOUNTER — Encounter: Payer: Self-pay | Admitting: Physician Assistant

## 2018-04-16 ENCOUNTER — Other Ambulatory Visit: Payer: Self-pay

## 2018-04-16 ENCOUNTER — Ambulatory Visit: Payer: BLUE CROSS/BLUE SHIELD | Admitting: Physician Assistant

## 2018-04-16 VITALS — BP 124/80 | HR 72 | Temp 97.7°F | Resp 14 | Ht 63.0 in | Wt 169.0 lb

## 2018-04-16 DIAGNOSIS — J019 Acute sinusitis, unspecified: Secondary | ICD-10-CM

## 2018-04-16 DIAGNOSIS — B9689 Other specified bacterial agents as the cause of diseases classified elsewhere: Secondary | ICD-10-CM

## 2018-04-16 MED ORDER — AMOXICILLIN-POT CLAVULANATE 875-125 MG PO TABS: 1.0000 | ORAL_TABLET | Freq: Two times a day (BID) | ORAL | 0 refills | Status: DC

## 2018-04-16 MED ORDER — BENZONATATE 100 MG PO CAPS: 100.0000 mg | ORAL_CAPSULE | Freq: Two times a day (BID) | ORAL | 0 refills | Status: DC | PRN

## 2018-04-16 NOTE — Progress Notes (Signed)
Patient presents to clinic today c/o 2 weeks of chest congestion, nasal congestion, sinus pressure, bilateral ear pressure with L ear pain. Notes left-sided sinus pain. Denies fever, chills. Denies SOB, chest pain. Notes occasional dizziness. Denies recent travel. Has taken Zyrtec and Mucinex to help with symptoms.  Past Medical History:  Diagnosis Date  . Chronic renal insufficiency, stage III (moderate) (HCC)    GFR 50's  . Fibromyalgia   . GERD (gastroesophageal reflux disease)   . Heart murmur   . Hemorrhoids   . History of bronchitis    childhood, wood stove and coal heat  . History of galactorrhea 02/05/2011  . Hyperlipidemia, mixed   . Hypertension   . IFG (impaired fasting glucose) 2016   HbA1c 5.7-5.8.  Marland Kitchen Knee pain, bilateral 02/05/2011  . Lumbar spondylosis   . Overweight(278.02)   . 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 Wyatt Mage sign of recurrent dz as of this 05/2016 f/u.  Released from surveillance as of 06/03/16; return to urol prn.  . Seasonal allergic rhinitis   . Solitary left kidney 2013   Aquired: right nephrectomy for renal cancer    Current Outpatient Medications on File Prior to Visit  Medication Sig Dispense Refill  . acetaminophen (TYLENOL) 500 MG tablet Take 1,000 mg by mouth every 8 (eight) hours as needed.    Marland Kitchen albuterol (VENTOLIN HFA) 108 (90 Base) MCG/ACT inhaler Inhale 2 puffs into the lungs every 6 (six) hours as needed for wheezing or shortness of breath. 1 Inhaler 0  . atorvastatin (LIPITOR) 80 MG tablet TAKE 1 TABLET (80 MG TOTAL) BY MOUTH DAILY. 90 tablet 1  . azelastine (ASTELIN) 0.1 % nasal spray     . cyclobenzaprine (FLEXERIL) 10 MG tablet 1 tab po tid prn 90 tablet 5  . diphenhydramine-acetaminophen (TYLENOL PM) 25-500 MG TABS Take 1 tablet by mouth at bedtime as needed.    . DULoxetine (CYMBALTA) 60 MG capsule TAKE 1  CAPSULE BY MOUTH EVERY DAY 90 capsule 1  . gabapentin (NEURONTIN) 300 MG capsule TAKE 2 CAPSULES BY MOUTH THREE TIMES A DAY 540 capsule 3  . losartan (COZAAR) 100 MG tablet TAKE 1 TABLET BY MOUTH EVERY DAY 30 tablet 5  . meloxicam (MOBIC) 15 MG tablet 1 tab po qd prn pain 30 tablet 2  . Multiple Vitamin (MULTIVITAMIN WITH MINERALS) TABS Take 1 tablet by mouth daily.    Marland Kitchen OVER THE COUNTER MEDICATION Slimvance - for weight loss    . oxyCODONE (OXY IR/ROXICODONE) 5 MG immediate release tablet 1 tab po q6h prn 60 tablet 0  . pantoprazole (PROTONIX) 40 MG tablet Take 1 tablet (40 mg total) by mouth daily. 90 tablet 1  . promethazine (PHENERGAN) 12.5 MG tablet 1-2 tabs po q6h prn nausea 30 tablet 0  . psyllium (METAMUCIL) 58.6 % powder Take 1 packet by mouth as needed.    . sennosides-docusate sodium (SENOKOT-S) 8.6-50 MG tablet Take 1 tablet by mouth 2 (two) times daily. While taking pain meds to prevent constipation 30 tablet 0  . Wheat Dextrin (BENEFIBER PO) Take by mouth.     No current facility-administered medications on file prior to visit.     Allergies  Allergen Reactions  . Codeine Nausea Only  . Dilaudid [Hydromorphone Hcl] Nausea And Vomiting    Family History  Problem Relation Age of Onset  . Thyroid disease Mother   . Heart  disease Mother        CHF, heart murmur, PAD  . Kidney disease Mother        RAS  . Other Mother 27       bowel obstruction,surgery this year  . Colon cancer Father 23  . Cancer Sister        cervical, breast  . Thyroid disease Sister   . Heart disease Brother   . Thyroid disease Maternal Grandfather   . Cancer Maternal Grandfather   . Diabetes Paternal Grandmother   . Cancer Paternal Grandmother        breast  . Heart disease Paternal Grandmother   . Heart disease Paternal Grandfather   . Diabetes Paternal Grandfather   . Hypertension Paternal Grandfather   . Thyroid disease Sister   . Diabetes Sister   . Diabetes Sister   . Hypertension  Sister   . Heart disease Maternal Grandmother   . Thyroid disease Maternal Grandmother   . Cancer Sister        kidney  . Asthma Daughter   . Allergies Daughter   . Lung disease Son        recurrent bronchitis  . Lung disease Daughter        recurrent bronchitis  . Cancer Paternal Aunt        breast cancer    Social History   Socioeconomic History  . Marital status: Married    Spouse name: Jerrye Beavers  . Number of children: 3  . Years of education: Not on file  . Highest education level: Not on file  Occupational History  . Not on file  Social Needs  . Financial resource strain: Not on file  . Food insecurity:    Worry: Not on file    Inability: Not on file  . Transportation needs:    Medical: Not on file    Non-medical: Not on file  Tobacco Use  . Smoking status: Former Smoker    Packs/day: 2.00    Years: 5.00    Pack years: 10.00    Types: Cigarettes    Last attempt to quit: 07/14/1981    Years since quitting: 36.7  . Smokeless tobacco: Never Used  Substance and Sexual Activity  . Alcohol use: No  . Drug use: No  . Sexual activity: Yes  Lifestyle  . Physical activity:    Days per week: Not on file    Minutes per session: Not on file  . Stress: Not on file  Relationships  . Social connections:    Talks on phone: Not on file    Gets together: Not on file    Attends religious service: Not on file    Active member of club or organization: Not on file    Attends meetings of clubs or organizations: Not on file    Relationship status: Not on file  Other Topics Concern  . Not on file  Social History Narrative   Married, 3 children.   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   No tobacco, alcohol, or drugs.  No exercise.   Review of Systems - See HPI.  All other ROS are negative.  BP 124/80   Pulse 72   Temp 97.7 F (36.5 C) (Oral)   Resp 14   Ht 5\' 3"  (1.6 m)   Wt 169 lb (76.7 kg)   SpO2 99%   BMI 29.94 kg/m   Physical Exam  Constitutional: She appears  well-developed and well-nourished.  HENT:  Head: Normocephalic and atraumatic.  Right Ear: A middle ear effusion (serous) is present.  Left Ear: A middle ear effusion (serous) is present.  + TTP of sinuses noted on exam  Eyes: Conjunctivae are normal.  Neck: Neck supple.  Cardiovascular: Normal rate, regular rhythm, normal heart sounds and intact distal pulses.  Pulmonary/Chest: Effort normal and breath sounds normal.  Lymphadenopathy:    She has no cervical adenopathy.  Psychiatric: She has a normal mood and affect.  Vitals reviewed.  Assessment/Plan: Rx Augmentin.  Increase fluids.  Rest.  Saline nasal spray.  Probiotic.  Mucinex as directed.  Humidifier in bedroom. Tessalon per orders.  Call or return to clinic if symptoms are not improving.   Leeanne Rio, PA-C

## 2018-04-16 NOTE — Patient Instructions (Signed)
Please take antibiotic as directed.  Increase fluid intake.  Use Saline nasal spray.  Take a daily multivitamin. Continue plain Mucinex and Astelin nasal spray. Start Tessalon as directed for cough.  Place a humidifier in the bedroom.  Please call or return clinic if symptoms are not improving.  Sinusitis Sinusitis is redness, soreness, and swelling (inflammation) of the paranasal sinuses. Paranasal sinuses are air pockets within the bones of your face (beneath the eyes, the middle of the forehead, or above the eyes). In healthy paranasal sinuses, mucus is able to drain out, and air is able to circulate through them by way of your nose. However, when your paranasal sinuses are inflamed, mucus and air can become trapped. This can allow bacteria and other germs to grow and cause infection. Sinusitis can develop quickly and last only a short time (acute) or continue over a long period (chronic). Sinusitis that lasts for more than 12 weeks is considered chronic.  CAUSES  Causes of sinusitis include:  Allergies.  Structural abnormalities, such as displacement of the cartilage that separates your nostrils (deviated septum), which can decrease the air flow through your nose and sinuses and affect sinus drainage.  Functional abnormalities, such as when the small hairs (cilia) that line your sinuses and help remove mucus do not work properly or are not present. SYMPTOMS  Symptoms of acute and chronic sinusitis are the same. The primary symptoms are pain and pressure around the affected sinuses. Other symptoms include:  Upper toothache.  Earache.  Headache.  Bad breath.  Decreased sense of smell and taste.  A cough, which worsens when you are lying flat.  Fatigue.  Fever.  Thick drainage from your nose, which often is green and may contain pus (purulent).  Swelling and warmth over the affected sinuses. DIAGNOSIS  Your caregiver will perform a physical exam. During the exam, your caregiver  may:  Look in your nose for signs of abnormal growths in your nostrils (nasal polyps).  Tap over the affected sinus to check for signs of infection.  View the inside of your sinuses (endoscopy) with a special imaging device with a light attached (endoscope), which is inserted into your sinuses. If your caregiver suspects that you have chronic sinusitis, one or more of the following tests may be recommended:  Allergy tests.  Nasal culture A sample of mucus is taken from your nose and sent to a lab and screened for bacteria.  Nasal cytology A sample of mucus is taken from your nose and examined by your caregiver to determine if your sinusitis is related to an allergy. TREATMENT  Most cases of acute sinusitis are related to a viral infection and will resolve on their own within 10 days. Sometimes medicines are prescribed to help relieve symptoms (pain medicine, decongestants, nasal steroid sprays, or saline sprays).  However, for sinusitis related to a bacterial infection, your caregiver will prescribe antibiotic medicines. These are medicines that will help kill the bacteria causing the infection.  Rarely, sinusitis is caused by a fungal infection. In theses cases, your caregiver will prescribe antifungal medicine. For some cases of chronic sinusitis, surgery is needed. Generally, these are cases in which sinusitis recurs more than 3 times per year, despite other treatments. HOME CARE INSTRUCTIONS   Drink plenty of water. Water helps thin the mucus so your sinuses can drain more easily.  Use a humidifier.  Inhale steam 3 to 4 times a day (for example, sit in the bathroom with the shower running).  Apply  a warm, moist washcloth to your face 3 to 4 times a day, or as directed by your caregiver.  Use saline nasal sprays to help moisten and clean your sinuses.  Take over-the-counter or prescription medicines for pain, discomfort, or fever only as directed by your caregiver. SEEK IMMEDIATE  MEDICAL CARE IF:  You have increasing pain or severe headaches.  You have nausea, vomiting, or drowsiness.  You have swelling around your face.  You have vision problems.  You have a stiff neck.  You have difficulty breathing. MAKE SURE YOU:   Understand these instructions.  Will watch your condition.  Will get help right away if you are not doing well or get worse. Document Released: 06/30/2005 Document Revised: 09/22/2011 Document Reviewed: 07/15/2011 [facility] Patient Information 2014 [place], [place].

## 2018-04-22 ENCOUNTER — Ambulatory Visit (INDEPENDENT_AMBULATORY_CARE_PROVIDER_SITE_OTHER): Payer: BLUE CROSS/BLUE SHIELD | Admitting: Family Medicine

## 2018-04-22 ENCOUNTER — Encounter: Payer: Self-pay | Admitting: Family Medicine

## 2018-04-22 VITALS — BP 114/77 | HR 74 | Temp 98.3°F | Resp 20 | Ht 62.75 in | Wt 166.2 lb

## 2018-04-22 DIAGNOSIS — M797 Fibromyalgia: Secondary | ICD-10-CM

## 2018-04-22 DIAGNOSIS — Z0000 Encounter for general adult medical examination without abnormal findings: Secondary | ICD-10-CM

## 2018-04-22 DIAGNOSIS — Z23 Encounter for immunization: Secondary | ICD-10-CM

## 2018-04-22 DIAGNOSIS — N183 Chronic kidney disease, stage 3 unspecified: Secondary | ICD-10-CM

## 2018-04-22 DIAGNOSIS — I10 Essential (primary) hypertension: Secondary | ICD-10-CM | POA: Diagnosis not present

## 2018-04-22 DIAGNOSIS — M170 Bilateral primary osteoarthritis of knee: Secondary | ICD-10-CM

## 2018-04-22 DIAGNOSIS — G894 Chronic pain syndrome: Secondary | ICD-10-CM

## 2018-04-22 DIAGNOSIS — R7301 Impaired fasting glucose: Secondary | ICD-10-CM

## 2018-04-22 MED ORDER — OXYCODONE HCL 5 MG PO TABS: ORAL_TABLET | ORAL | 0 refills | Status: DC

## 2018-04-22 MED ORDER — FLUCONAZOLE 150 MG PO TABS: 150.0000 mg | ORAL_TABLET | Freq: Once | ORAL | 1 refills | Status: AC

## 2018-04-22 NOTE — Patient Instructions (Signed)

## 2018-04-22 NOTE — Progress Notes (Signed)
Office Note 04/22/2018  CC:  Chief Complaint  Patient presents with  . Annual Exam    HPI:  [name] is a 62 y.o. White female who is here for annual health maintenance exam and f/u chronic pain. Her GYN is Dr. Matthew Saras.   Being treated now for acute sinusitis--mucinex, amoxil, tessalon.  Describes classic BPPV recently--says she has never had this before.  This resolved after a few episodes.  Indication for chronic opioid: chronic lumbar and bilat knee osteoarthritis + fibromyalgia pain that I treat with pain meds in order to maximize functioning and quality of life. Medication and dose: oxycodone 5mg  # pills per month: #60 Last UDS date: 11/07/16 Opioid Treatment Agreement signed (Y/N): Y, 11/07/16 Opioid Treatment Agreement last reviewed with patient: today [place] reviewed this encounter (include red flags): today, no red flags.   Past Medical History:  Diagnosis Date  . Chronic renal insufficiency, stage III (moderate) (HCC)    GFR 50's  . Fibromyalgia   . GERD (gastroesophageal reflux disease)   . Heart murmur   . Hemorrhoids   . History of bronchitis    childhood, wood stove and coal heat  . History of galactorrhea 02/05/2011  . Hyperlipidemia, mixed   . Hypertension   . IFG (impaired fasting glucose) 2016   HbA1c 5.7-5.8.  Marland Kitchen Knee pain, bilateral 02/05/2011  . Lumbar spondylosis   . Overweight(278.02)   . 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 Wyatt Mage sign of recurrent dz as of this 05/2016 f/u.  Released from surveillance as of 06/03/16; return to urol prn.  . Seasonal allergic rhinitis   . Solitary left kidney 2013   Aquired: right nephrectomy for renal cancer    Past Surgical History:  Procedure Laterality Date  . CESAREAN SECTION  1983  . COLONOSCOPY  04/2013   Normal.  Repeat 2019 ([facility]  . ROBOT ASSISTED LAPAROSCOPIC  NEPHRECTOMY  07/12/2012   Procedure: ROBOTIC ASSISTED LAPAROSCOPIC NEPHRECTOMY;  Surgeon: Alexis Frock, MD;  Location: [facility];  Service: Urology;  Laterality: Right;  . TONSILLECTOMY    . TUBAL LIGATION      Family History  Problem Relation Age of Onset  . Thyroid disease Mother   . Heart disease Mother        CHF, heart murmur, PAD  . Kidney disease Mother        RAS  . Other Mother 38       bowel obstruction,surgery this year  . Colon cancer Father 43  . Cancer Sister        cervical, breast  . Thyroid disease Sister   . Heart disease Brother   . Thyroid disease Maternal Grandfather   . Cancer Maternal Grandfather   . Diabetes Paternal Grandmother   . Cancer Paternal Grandmother        breast  . Heart disease Paternal Grandmother   . Heart disease Paternal Grandfather   . Diabetes Paternal Grandfather   . Hypertension Paternal Grandfather   . Thyroid disease Sister   . Diabetes Sister   . Diabetes Sister   . Hypertension Sister   . Heart disease Maternal Grandmother   . Thyroid disease Maternal Grandmother   . Cancer Sister        kidney  . Asthma Daughter   . Allergies Daughter   . Lung disease Son        recurrent bronchitis  .  Lung disease Daughter        recurrent bronchitis  . Cancer Paternal Aunt        breast cancer    Social History   Socioeconomic History  . Marital status: Married    Spouse name: Jerrye Beavers  . Number of children: 3  . Years of education: Not on file  . Highest education level: Not on file  Occupational History  . Not on file  Social Needs  . Financial resource strain: Not on file  . Food insecurity:    Worry: Not on file    Inability: Not on file  . Transportation needs:    Medical: Not on file    Non-medical: Not on file  Tobacco Use  . Smoking status: Former Smoker    Packs/day: 2.00    Years: 5.00    Pack years: 10.00    Types: Cigarettes    Last attempt to quit: 07/14/1981    Years since quitting: 36.7  . Smokeless  tobacco: Never Used  Substance and Sexual Activity  . Alcohol use: No  . Drug use: No  . Sexual activity: Yes  Lifestyle  . Physical activity:    Days per week: Not on file    Minutes per session: Not on file  . Stress: Not on file  Relationships  . Social connections:    Talks on phone: Not on file    Gets together: Not on file    Attends religious service: Not on file    Active member of club or organization: Not on file    Attends meetings of clubs or organizations: Not on file    Relationship status: Not on file  . Intimate partner violence:    Fear of current or ex partner: Not on file    Emotionally abused: Not on file    Physically abused: Not on file    Forced sexual activity: Not on file  Other Topics Concern  . Not on file  Social History Narrative   Married, 3 children.   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   No tobacco, alcohol, or drugs.  No exercise.    Outpatient Medications Prior to Visit  Medication Sig Dispense Refill  . acetaminophen (TYLENOL) 500 MG tablet Take 1,000 mg by mouth every 8 (eight) hours as needed.    Marland Kitchen albuterol (VENTOLIN HFA) 108 (90 Base) MCG/ACT inhaler Inhale 2 puffs into the lungs every 6 (six) hours as needed for wheezing or shortness of breath. 1 Inhaler 0  . amoxicillin-clavulanate (AUGMENTIN) 875-125 MG tablet Take 1 tablet by mouth 2 (two) times daily. 14 tablet 0  . atorvastatin (LIPITOR) 80 MG tablet TAKE 1 TABLET (80 MG TOTAL) BY MOUTH DAILY. 90 tablet 1  . azelastine (ASTELIN) 0.1 % nasal spray     . benzonatate (TESSALON) 100 MG capsule Take 1 capsule (100 mg total) by mouth 2 (two) times daily as needed for cough. 20 capsule 0  . cyclobenzaprine (FLEXERIL) 10 MG tablet 1 tab po tid prn 90 tablet 5  . diphenhydramine-acetaminophen (TYLENOL PM) 25-500 MG TABS Take 1 tablet by mouth at bedtime as needed.    . DULoxetine (CYMBALTA) 60 MG capsule TAKE 1 CAPSULE BY MOUTH EVERY DAY 90 capsule 1  . gabapentin (NEURONTIN) 300 MG  capsule TAKE 2 CAPSULES BY MOUTH THREE TIMES A DAY 540 capsule 3  . losartan (COZAAR) 100 MG tablet TAKE 1 TABLET BY MOUTH EVERY DAY 30 tablet 5  . meloxicam (MOBIC) 15  MG tablet 1 tab po qd prn pain 30 tablet 2  . Multiple Vitamin (MULTIVITAMIN WITH MINERALS) TABS Take 1 tablet by mouth daily.    Marland Kitchen OVER THE COUNTER MEDICATION Slimvance - for weight loss    . oxyCODONE (OXY IR/ROXICODONE) 5 MG immediate release tablet 1 tab po q6h prn 60 tablet 0  . pantoprazole (PROTONIX) 40 MG tablet Take 1 tablet (40 mg total) by mouth daily. 90 tablet 1  . promethazine (PHENERGAN) 12.5 MG tablet 1-2 tabs po q6h prn nausea 30 tablet 0  . psyllium (METAMUCIL) 58.6 % powder Take 1 packet by mouth as needed.    . sennosides-docusate sodium (SENOKOT-S) 8.6-50 MG tablet Take 1 tablet by mouth 2 (two) times daily. While taking pain meds to prevent constipation 30 tablet 0  . Wheat Dextrin (BENEFIBER PO) Take by mouth.     No facility-administered medications prior to visit.     Allergies  Allergen Reactions  . Codeine Nausea Only  . Dilaudid [Hydromorphone Hcl] Nausea And Vomiting    ROS Review of Systems  Constitutional: Negative for appetite change, chills, fatigue and fever.  HENT: Negative for congestion, dental problem, ear pain and sore throat.   Eyes: Negative for discharge, redness and visual disturbance.  Respiratory: Negative for cough, chest tightness, shortness of breath and wheezing.   Cardiovascular: Negative for chest pain, palpitations and leg swelling.  Gastrointestinal: Negative for abdominal pain, blood in stool, diarrhea, nausea and vomiting.  Genitourinary: Negative for difficulty urinating, dysuria, flank pain, frequency, hematuria and urgency.  Musculoskeletal: Positive for arthralgias (knees, back). Negative for back pain, joint swelling, myalgias and neck stiffness.  Skin: Negative for pallor and rash.  Neurological: Negative for dizziness, speech difficulty, weakness and  headaches.  Hematological: Negative for adenopathy. Does not bruise/bleed easily.  Psychiatric/Behavioral: Negative for confusion and sleep disturbance. The patient is not nervous/anxious.     PE; Blood pressure 114/77, pulse 74, temperature 98.3 F (36.8 C), resp. rate 20, height 5' 2.75" (1.594 m), weight 166 lb 3.2 oz (75.4 kg), SpO2 96 %. Exam chaperoned by Starla Link, CMA. Gen: Alert, well appearing.  Patient is oriented to person, place, time, and situation. AFFECT: pleasant, lucid thought and speech. ENT: Ears: EACs clear, normal epithelium.  TMs with good light reflex and landmarks bilaterally.  Eyes: no injection, icteris, swelling, or exudate.  EOMI, PERRLA. Nose: no drainage or turbinate edema/swelling.  No injection or focal lesion.  Mouth: lips without lesion/swelling.  Oral mucosa pink and moist.  Dentition intact and without obvious caries or gingival swelling.  Oropharynx without erythema, exudate, or swelling.  Neck: supple/nontender.  No LAD, mass, or TM.  Carotid pulses 2+ bilaterally, without bruits. CV: RRR, soft systolic murmur, S1 and S2 normal, no diastolic mumur.  No r/g.   LUNGS: CTA bilat, nonlabored resps, good aeration in all lung fields. ABD: soft, NT, ND, BS normal.  No hepatospenomegaly or mass.  No bruits. EXT: no clubbing, cyanosis, or edema.  Musculoskeletal: no joint swelling, erythema, warmth, or tenderness.  ROM of all joints intact. Skin - no sores or suspicious lesions or rashes or color changes   Pertinent labs:  Lab Results  Component Value Date   TSH 1.06 07/09/2016   Lab Results  Component Value Date   WBC 7.4 01/02/2016   HGB 12.7 01/02/2016   HCT 37.7 01/02/2016   MCV 90.8 01/02/2016   PLT 294 01/02/2016   Lab Results  Component Value Date   CREATININE 0.99 01/07/2018  BUN 16 01/07/2018   NA 140 01/07/2018   K 4.1 01/07/2018   CL 104 01/07/2018   CO2 28 01/07/2018   Lab Results  Component Value Date   ALT 30 07/09/2016   AST  21 07/09/2016   ALKPHOS 101 07/09/2016   BILITOT 0.5 07/09/2016   Lab Results  Component Value Date   CHOL 190 11/07/2016   Lab Results  Component Value Date   HDL 64.60 11/07/2016   Lab Results  Component Value Date   LDLCALC 90 11/07/2016   Lab Results  Component Value Date   TRIG 174.0 (H) 11/07/2016   Lab Results  Component Value Date   CHOLHDL 3 11/07/2016   Lab Results  Component Value Date   HGBA1C 5.8 (H) 01/02/2016    ASSESSMENT AND PLAN:    1) Chronic pain syndrome:  Renewed controlled substance contract today. UDS today (should show oxycodone only). I did electronic rx's for oxycodone 5mg , 1-2 tabs po q6h prn today for this month, Nov 2019, and Dec 2019.  Appropriate fill on/after date was noted on each rx.   Health maintenance exam: Reviewed age and gender appropriate health maintenance issues (prudent diet, regular exercise, health risks of tobacco and excessive alcohol, use of seatbelts, fire alarms in home, use of sunscreen).  Also reviewed age and gender appropriate health screening as well as vaccine recommendations. Vaccines: flu-->UTD.  Shingrix-->#1 given today. Labs: fasting CBC, CMET,  + HbA1c (IFG) (she has not eaten in 7 hours). Cervical ca screening:  She'll make appt with her GYN ASAP. Breast ca screening: mammo's and DEXA's through her GYN office. Colon ca screening: next colonoscopy due this year (5 yr recall).  She should get a recall letter any time now.  An After Visit Summary was printed and given to the patient.  FOLLOW UP:  3 mo RCI/pain  Signed:  Crissie Sickles, MD           04/22/2018

## 2018-04-25 LAB — PAIN MGMT, PROFILE 8 W/CONF, U
6 Acetylmorphine: NEGATIVE ng/mL (ref <10)
ALCOHOL METABOLITES: NEGATIVE ng/mL (ref <500)
Amphetamines: NEGATIVE ng/mL (ref <500)
BUPRENORPHINE, URINE: NEGATIVE ng/mL (ref <5)
Benzodiazepines: NEGATIVE ng/mL (ref <100)
Cocaine Metabolite: NEGATIVE ng/mL (ref <150)
Creatinine: 162.8 mg/dL
MARIJUANA METABOLITE: NEGATIVE ng/mL (ref <20)
MDMA: NEGATIVE ng/mL (ref <500)
Noroxycodone: 602 ng/mL — ABNORMAL HIGH (ref <50)
OXYCODONE: POSITIVE ng/mL — AB (ref <100)
Opiates: NEGATIVE ng/mL (ref <100)
Oxidant: NEGATIVE ug/mL (ref <200)
Oxycodone: 178 ng/mL — ABNORMAL HIGH (ref <50)
Oxymorphone: 354 ng/mL — ABNORMAL HIGH (ref <50)
pH: 5.98 (ref 4.5–9.0)

## 2018-04-25 LAB — COMPREHENSIVE METABOLIC PANEL
AG RATIO: 1.5 (calc) (ref 1.0–2.5)
ALT: 11 U/L (ref 6–29)
AST: 13 U/L (ref 10–35)
Albumin: 4.1 g/dL (ref 3.6–5.1)
Alkaline phosphatase (APISO): 88 U/L (ref 33–130)
BUN: 15 mg/dL (ref 7–25)
CALCIUM: 9.3 mg/dL (ref 8.6–10.4)
CHLORIDE: 103 mmol/L (ref 98–110)
CO2: 29 mmol/L (ref 20–32)
Creat: 0.98 mg/dL (ref 0.50–0.99)
GLOBULIN: 2.8 g/dL (ref 1.9–3.7)
GLUCOSE: 96 mg/dL (ref 65–99)
POTASSIUM: 4.1 mmol/L (ref 3.5–5.3)
SODIUM: 139 mmol/L (ref 135–146)
TOTAL PROTEIN: 6.9 g/dL (ref 6.1–8.1)
Total Bilirubin: 0.5 mg/dL (ref 0.2–1.2)

## 2018-04-25 LAB — CBC WITH DIFFERENTIAL/PLATELET
BASOS ABS: 36 {cells}/uL (ref 0–200)
Basophils Relative: 0.4 %
EOS ABS: 315 {cells}/uL (ref 15–500)
EOS PCT: 3.5 %
HEMATOCRIT: 35.1 % (ref 35.0–45.0)
HEMOGLOBIN: 12.5 g/dL (ref 11.7–15.5)
Lymphs Abs: 2682 cells/uL (ref 850–3900)
MCH: 31.9 pg (ref 27.0–33.0)
MCHC: 35.6 g/dL (ref 32.0–36.0)
MCV: 89.5 fL (ref 80.0–100.0)
MONOS PCT: 7.1 %
MPV: 11.3 fL (ref 7.5–12.5)
NEUTROS ABS: 5328 {cells}/uL (ref 1500–7800)
Neutrophils Relative %: 59.2 %
Platelets: 275 10*3/uL (ref 140–400)
RBC: 3.92 10*6/uL (ref 3.80–5.10)
RDW: 12.6 % (ref 11.0–15.0)
Total Lymphocyte: 29.8 %
WBC mixed population: 639 cells/uL (ref 200–950)
WBC: 9 10*3/uL (ref 3.8–10.8)

## 2018-04-25 LAB — TSH: TSH: 0.9 m[IU]/L (ref 0.40–4.50)

## 2018-04-25 LAB — HEMOGLOBIN A1C
Hgb A1c MFr Bld: 5.5 % of total Hgb (ref <5.7)
Mean Plasma Glucose: 111 (calc)
eAG (mmol/L): 6.2 (calc)

## 2018-04-25 LAB — LIPID PANEL
CHOL/HDL RATIO: 2.4 (calc) (ref <5.0)
Cholesterol: 159 mg/dL (ref <200)
HDL: 65 mg/dL (ref >50)
LDL CHOLESTEROL (CALC): 70 mg/dL
Non-HDL Cholesterol (Calc): 94 mg/dL (calc) (ref <130)
TRIGLYCERIDES: 154 mg/dL — AB (ref <150)

## 2018-05-02 ENCOUNTER — Encounter: Payer: Self-pay | Admitting: Family Medicine

## 2018-05-31 ENCOUNTER — Other Ambulatory Visit: Payer: Self-pay | Admitting: Family Medicine

## 2018-06-15 ENCOUNTER — Telehealth: Payer: Self-pay | Admitting: Family Medicine

## 2018-06-15 NOTE — Telephone Encounter (Signed)
Copied from [place] [id number]. Topic: Referral - Request for Referral >> Jun 15, 2018  3:05 PM Vernona Rieger wrote: Has patient seen PCP for this complaint? Yes, she said she needs a new referral because her wrist's are acting up and causing her fingers to go numb *If NO, is insurance requiring patient see PCP for this issue before PCP can refer them? Referral for which specialty: orthopedic  Preferred provider/office: [facility] Reason for referral: both wrist pain >> Jun 15, 2018  3:19 PM [place], Marylu Lund wrote: Called patient, she had a referral sent to [facility] July 2019. She did not make an appointment then since she felt better. It has gotten worse again so I gave her their phone number to schedule an appointment. Patient will call back if she has any problems scheduling.

## 2018-07-08 ENCOUNTER — Other Ambulatory Visit: Payer: Self-pay | Admitting: Family Medicine

## 2018-07-22 DIAGNOSIS — G5602 Carpal tunnel syndrome, left upper limb: Secondary | ICD-10-CM | POA: Insufficient documentation

## 2018-07-22 DIAGNOSIS — G5601 Carpal tunnel syndrome, right upper limb: Secondary | ICD-10-CM | POA: Diagnosis not present

## 2018-07-28 ENCOUNTER — Telehealth: Payer: Self-pay | Admitting: *Deleted

## 2018-07-28 ENCOUNTER — Encounter: Payer: Self-pay | Admitting: Family Medicine

## 2018-07-28 ENCOUNTER — Ambulatory Visit: Payer: BLUE CROSS/BLUE SHIELD | Admitting: Family Medicine

## 2018-07-28 VITALS — BP 123/72 | HR 75 | Temp 97.8°F | Resp 16 | Ht 62.75 in | Wt 170.2 lb

## 2018-07-28 DIAGNOSIS — M797 Fibromyalgia: Secondary | ICD-10-CM

## 2018-07-28 DIAGNOSIS — E669 Obesity, unspecified: Secondary | ICD-10-CM

## 2018-07-28 DIAGNOSIS — G894 Chronic pain syndrome: Secondary | ICD-10-CM | POA: Diagnosis not present

## 2018-07-28 DIAGNOSIS — Z23 Encounter for immunization: Secondary | ICD-10-CM | POA: Diagnosis not present

## 2018-07-28 DIAGNOSIS — M170 Bilateral primary osteoarthritis of knee: Secondary | ICD-10-CM

## 2018-07-28 MED ORDER — OXYCODONE HCL 5 MG PO TABS: ORAL_TABLET | ORAL | 0 refills | Status: DC

## 2018-07-28 NOTE — Telephone Encounter (Signed)
Left message advising pt that letter is ready for p/u at our front desk.

## 2018-07-28 NOTE — Telephone Encounter (Signed)
OK, will do letter.

## 2018-07-28 NOTE — Progress Notes (Signed)
OK, will do letter.

## 2018-07-28 NOTE — Progress Notes (Signed)
OFFICE VISIT  07/28/2018   CC:  Chief Complaint  Patient presents with  . Follow-up    RCI, pt is fasting.     HPI:    Patient is a 63 y.o. Caucasian female who presents for 3 mo f/u chronic pain syndrome.  Indication for chronic opioid: chronic lumbar and bilat knee osteoarthritis + fibromyalgia pain that I treat with pain meds in order to maximize functioning and quality of life. Medication and dose: oxycodone 5mg  # pills per month: 60 Last UDS date: 04/22/18 Opioid Treatment Agreement signed (Y/N): Y, 04/22/18 Opioid Treatment Agreement last reviewed with patient:  today [place] reviewed this encounter (include red flags):  today, no red flags.  Pain is essentially unchanged, fairly well controlled with taking oxyc 5mg  bid-tid.   No adverse effects noted.   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   After getting this problem addressed, she wants to see a podiatrist (or ortho) to address a soft subQ nodule over her R MTP joint and a left foot plantar wart.   Past Medical History:  Diagnosis Date  . BPPV (benign paroxysmal positional vertigo)   . Chronic pain syndrome   . Chronic renal insufficiency, stage III (moderate) (HCC)    GFR 50's  . Fibromyalgia   . GERD (gastroesophageal reflux disease)   . Heart murmur   . Hemorrhoids   . History of bronchitis    childhood, wood stove and coal heat  . History of galactorrhea 02/05/2011  . Hyperlipidemia, mixed   . Hypertension   . IFG (impaired fasting glucose) 2016   HbA1c 5.7-5.8.  Marland Kitchen Knee pain, bilateral 02/05/2011  . Lumbar spondylosis   . Overweight(278.02)   . 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 Wyatt Mage sign of recurrent dz as of this 05/2016 f/u.  Released from surveillance as of 06/03/16; return to urol prn.  . Seasonal allergic rhinitis   . Solitary left  kidney 2013   Aquired: right nephrectomy for renal cancer    Past Surgical History:  Procedure Laterality Date  . CESAREAN SECTION  1983  . COLONOSCOPY  04/2013   Normal.  Repeat 2019 ([facility]  . ROBOT ASSISTED LAPAROSCOPIC NEPHRECTOMY  07/12/2012   Procedure: ROBOTIC ASSISTED LAPAROSCOPIC NEPHRECTOMY;  Surgeon: Alexis Frock, MD;  Location: [facility];  Service: Urology;  Laterality: Right;  . TONSILLECTOMY    . TUBAL LIGATION      Outpatient Medications Prior to Visit  Medication Sig Dispense Refill  . acetaminophen (TYLENOL) 500 MG tablet Take 1,000 mg by mouth every 8 (eight) hours as needed.    Marland Kitchen albuterol (VENTOLIN HFA) 108 (90 Base) MCG/ACT inhaler Inhale 2 puffs into the lungs every 6 (six) hours as needed for wheezing or shortness of breath. 1 Inhaler 0  . atorvastatin (LIPITOR) 80 MG tablet TAKE 1 TABLET (80 MG TOTAL) BY MOUTH DAILY. 90 tablet 1  . azelastine (ASTELIN) 0.1 % nasal spray     . cyclobenzaprine (FLEXERIL) 10 MG tablet 1 tab po tid prn 90 tablet 5  . diphenhydramine-acetaminophen (TYLENOL PM) 25-500 MG TABS Take 1 tablet by mouth at bedtime as needed.    . DULoxetine (CYMBALTA) 60 MG capsule TAKE 1 CAPSULE BY MOUTH EVERY DAY 90 capsule 1  . gabapentin (NEURONTIN) 300 MG capsule TAKE 2 CAPSULES BY MOUTH THREE TIMES A DAY 540 capsule 3  .  losartan (COZAAR) 100 MG tablet TAKE 1 TABLET BY MOUTH EVERY DAY 90 tablet 1  . meloxicam (MOBIC) 15 MG tablet 1 tab po qd prn pain 30 tablet 2  . Multiple Vitamin (MULTIVITAMIN WITH MINERALS) TABS Take 1 tablet by mouth daily.    Marland Kitchen OVER THE COUNTER MEDICATION Slimvance - for weight loss    . pantoprazole (PROTONIX) 40 MG tablet Take 1 tablet (40 mg total) by mouth daily. 90 tablet 1  . promethazine (PHENERGAN) 12.5 MG tablet 1-2 tabs po q6h prn nausea 30 tablet 0  . psyllium (METAMUCIL) 58.6 % powder Take 1 packet by mouth as needed.    . sennosides-docusate sodium (SENOKOT-S) 8.6-50 MG tablet Take 1 tablet by mouth 2 (two)  times daily. While taking pain meds to prevent constipation 30 tablet 0  . Wheat Dextrin (BENEFIBER PO) Take by mouth.    . oxyCODONE (OXY IR/ROXICODONE) 5 MG immediate release tablet 1-2 tabs po q6h prn 60 tablet 0  . amoxicillin-clavulanate (AUGMENTIN) 875-125 MG tablet Take 1 tablet by mouth 2 (two) times daily. (Patient not taking: Reported on 07/28/2018) 14 tablet 0  . benzonatate (TESSALON) 100 MG capsule Take 1 capsule (100 mg total) by mouth 2 (two) times daily as needed for cough. (Patient not taking: Reported on 07/28/2018) 20 capsule 0   No facility-administered medications prior to visit.     Allergies  Allergen Reactions  . Codeine Nausea Only  . Dilaudid [Hydromorphone Hcl] Nausea And Vomiting    ROS As per HPI  PE: Blood pressure 123/72, pulse 75, temperature 97.8 F (36.6 C), temperature source Oral, resp. rate 16, height 5' 2.75" (1.594 m), weight 170 lb 4 oz (77.2 kg), SpO2 96 %. Gen: Alert, well appearing.  Patient is oriented to person, place, time, and situation. AFFECT: pleasant, lucid thought and speech. No further exam today.  LABS:  Lab Results  Component Value Date   TSH 0.90 04/22/2018   Lab Results  Component Value Date   WBC 9.0 04/22/2018   HGB 12.5 04/22/2018   HCT 35.1 04/22/2018   MCV 89.5 04/22/2018   PLT 275 04/22/2018   Lab Results  Component Value Date   CREATININE 0.98 04/22/2018   BUN 15 04/22/2018   NA 139 04/22/2018   K 4.1 04/22/2018   CL 103 04/22/2018   CO2 29 04/22/2018   Lab Results  Component Value Date   ALT 11 04/22/2018   AST 13 04/22/2018   ALKPHOS 101 07/09/2016   BILITOT 0.5 04/22/2018   Lab Results  Component Value Date   CHOL 159 04/22/2018   Lab Results  Component Value Date   HDL 65 04/22/2018   Lab Results  Component Value Date   LDLCALC 70 04/22/2018   Lab Results  Component Value Date   TRIG 154 (H) 04/22/2018   Lab Results  Component Value Date   CHOLHDL 2.4 04/22/2018   Lab Results   Component Value Date   HGBA1C 5.5 04/22/2018    IMPRESSION AND PLAN:  1) Chronic pain syndrome: bilat knee osteoarth + fibromyalgia. The current medical regimen is effective;  continue present plan and medications. I did electronic rx's for oxycodone 5mg , 1 tab qid prn, #60 today for this month, Feb 2020, and Mar 2020.  Appropriate fill on/after date was noted on each rx. CSC and UDS UTD. No labs needed today.   An After Visit Summary was printed and given to the patient.  FOLLOW UP: Return in about 3 months (around  10/27/2018) for chronic pain + CRI f/u..  Signed:  Crissie Sickles, MD           07/28/2018

## 2018-07-28 NOTE — Telephone Encounter (Signed)
Pt is requesting a note from Dr. Anitra Lauth saying that she needs to have water at her work station. 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 She stated that her employer will not let her have water at her work station without a doctors note.

## 2018-07-30 ENCOUNTER — Encounter: Payer: Self-pay | Admitting: Family Medicine

## 2018-08-09 ENCOUNTER — Encounter: Payer: Self-pay | Admitting: Internal Medicine

## 2018-08-09 DIAGNOSIS — G5603 Carpal tunnel syndrome, bilateral upper limbs: Secondary | ICD-10-CM | POA: Diagnosis not present

## 2018-09-07 DIAGNOSIS — G5602 Carpal tunnel syndrome, left upper limb: Secondary | ICD-10-CM | POA: Diagnosis not present

## 2018-09-07 DIAGNOSIS — G5601 Carpal tunnel syndrome, right upper limb: Secondary | ICD-10-CM | POA: Diagnosis not present

## 2018-09-12 HISTORY — PX: CARPAL TUNNEL RELEASE: SHX101

## 2018-09-14 ENCOUNTER — Encounter: Payer: Self-pay | Admitting: Family Medicine

## 2018-09-20 DIAGNOSIS — G5601 Carpal tunnel syndrome, right upper limb: Secondary | ICD-10-CM | POA: Diagnosis not present

## 2018-09-24 ENCOUNTER — Other Ambulatory Visit: Payer: Self-pay | Admitting: Family Medicine

## 2018-09-24 NOTE — Telephone Encounter (Signed)
RF request for gabapentin LOV: 07/28/18 Next ov: 10/27/18 Last written: 08/06/17 #540 w/ 3RF  Please advise. Thanks.

## 2018-10-05 DIAGNOSIS — G5601 Carpal tunnel syndrome, right upper limb: Secondary | ICD-10-CM | POA: Diagnosis not present

## 2018-10-11 ENCOUNTER — Telehealth: Payer: Self-pay | Admitting: Family Medicine

## 2018-10-11 NOTE — Telephone Encounter (Signed)
Patient is having a lot of knee pain in both of her knees. Patient is requesting an appointment to come in to have injections in both knees. Please advise.

## 2018-10-11 NOTE — Telephone Encounter (Signed)
Patient was seen on 07/28/18 for RCI follow up and scheduled for her next one Mid April.  Please advise, thanks.

## 2018-10-11 NOTE — Telephone Encounter (Signed)
Steroid injections into the knee can be given as often as every 3 months.  Now would be too early. The earliest we could repeat would be around April 15th.  Did the prior injections help well?  If so, how long did she get relief?-thx

## 2018-10-12 NOTE — Telephone Encounter (Signed)
FYI: Patient advised and voiced understanding. She stated the injections helped some and gave relief for several months. Will call back around 4/15 to schedule next injection.

## 2018-10-12 NOTE — Telephone Encounter (Signed)
Noted  

## 2018-10-27 ENCOUNTER — Ambulatory Visit: Payer: BLUE CROSS/BLUE SHIELD | Admitting: Family Medicine

## 2018-10-28 ENCOUNTER — Other Ambulatory Visit: Payer: Self-pay

## 2018-10-28 ENCOUNTER — Encounter: Payer: Self-pay | Admitting: Family Medicine

## 2018-10-28 ENCOUNTER — Ambulatory Visit (INDEPENDENT_AMBULATORY_CARE_PROVIDER_SITE_OTHER): Payer: BLUE CROSS/BLUE SHIELD | Admitting: Family Medicine

## 2018-10-28 VITALS — BP 137/75 | HR 78 | Temp 96.4°F

## 2018-10-28 DIAGNOSIS — G894 Chronic pain syndrome: Secondary | ICD-10-CM | POA: Diagnosis not present

## 2018-10-28 DIAGNOSIS — M170 Bilateral primary osteoarthritis of knee: Secondary | ICD-10-CM | POA: Diagnosis not present

## 2018-10-28 DIAGNOSIS — J0191 Acute recurrent sinusitis, unspecified: Secondary | ICD-10-CM

## 2018-10-28 DIAGNOSIS — M25561 Pain in right knee: Secondary | ICD-10-CM | POA: Diagnosis not present

## 2018-10-28 DIAGNOSIS — M797 Fibromyalgia: Secondary | ICD-10-CM | POA: Diagnosis not present

## 2018-10-28 DIAGNOSIS — M25562 Pain in left knee: Secondary | ICD-10-CM

## 2018-10-28 DIAGNOSIS — N3000 Acute cystitis without hematuria: Secondary | ICD-10-CM

## 2018-10-28 DIAGNOSIS — G8929 Other chronic pain: Secondary | ICD-10-CM

## 2018-10-28 MED ORDER — OXYCODONE HCL 5 MG PO TABS: ORAL_TABLET | ORAL | 0 refills | Status: DC

## 2018-10-28 MED ORDER — CEPHALEXIN 500 MG PO CAPS: 500.0000 mg | ORAL_CAPSULE | Freq: Three times a day (TID) | ORAL | 0 refills | Status: DC

## 2018-10-28 NOTE — Progress Notes (Signed)
Virtual Visit via Video Note  I connected with pt  on 10/28/18 at  2:00 PM EDT by a video enabled telemedicine application and verified that I am speaking with the correct person using two identifiers.  Location patient: home Location provider:work or home office Persons participating in the virtual visit: patient, provider  I discussed the limitations of evaluation and management by telemedicine and the availability of in person appointments. The patient expressed understanding and agreed to proceed.  Telemedicine visit is a necessity given the COVID-19 restrictions in place at the current time.  HPI: 63 y/o WF here for 3 mo f/u chronic pain syndrome.   Indication for chronic opioid: chronic lumbar and bilat knee osteoarthritis + fibromyalgia pain that I treat with pain meds in order to maximize functioning and quality of life. Medication and dose: oxycodone 5mg  # pills per month: 60 Last UDS date: 04/22/18 Opioid Treatment Agreement signed (Y/N): Y, 04/22/18 Opioid Treatment Agreement last reviewed with patient:  today Paton reviewed this encounter (include red flags):  today, no red flags.  She got R carpal tunnel release surgery and it went well, pain is dissipated a lot. Knees hurting her more lately, probably b/c she has gained 10 lbs since being out of work lately. Low back and fibromyalgia pain stable except for after getting shingrix vaccine--for a week felt like she had flu--back in Oct 2019. Taking at least 2 oxycodone per day usually, lately some days took 3 b/c of post op pain and worsened knee pain recently.  Also lately having worsened allergies/sinusitis sx's. Nasal congestion worse, facial pain, upper teeth pain diffusely, peri-orbital HA's. No fevers.  No cough.  Occ uncomfortable PND sore throat. Also, some burning upon urination, urgency, frequency---all x 2-3d. No gross hematuria.   ROS: See pertinent positives and negatives per HPI.  Past Medical History:   Diagnosis Date  . Bilateral carpal tunnel syndrome    pt to get bilat carpal tunnel release as of 09/2018 ortho eval  . BPPV (benign paroxysmal positional vertigo)   . Chronic pain syndrome   . Chronic renal insufficiency, stage III (moderate) (HCC)    GFR 50's  . Fibromyalgia   . GERD (gastroesophageal reflux disease)   . Heart murmur   . Hemorrhoids   . History of bronchitis    childhood, wood stove and coal heat  . History of galactorrhea 02/05/2011  . Hyperlipidemia, mixed   . Hypertension   . IFG (impaired fasting glucose) 2016   HbA1c 5.7-5.8.  Marland Kitchen Knee pain, bilateral 02/05/2011  . Lumbar spondylosis   . Overweight(278.02)   . 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 Wyatt Mage sign of recurrent dz as of this 05/2016 f/u.  Released from surveillance as of 06/03/16; return to urol prn.  . Seasonal allergic rhinitis   . Solitary left kidney 2013   Aquired: right nephrectomy for renal cancer    Past Surgical History:  Procedure Laterality Date  . CESAREAN SECTION  1983  . COLONOSCOPY  04/2013   Normal.  Repeat 2019 ([facility]  . ROBOT ASSISTED LAPAROSCOPIC NEPHRECTOMY  07/12/2012   Procedure: ROBOTIC ASSISTED LAPAROSCOPIC NEPHRECTOMY;  Surgeon: Alexis Frock, MD;  Location: [facility];  Service: Urology;  Laterality: Right;  . TONSILLECTOMY    . TUBAL LIGATION      Family History  Problem Relation Age of Onset  . Thyroid disease Mother   .  Heart disease Mother        CHF, heart murmur, PAD  . Kidney disease Mother        RAS  . Other Mother 13       bowel obstruction,surgery this year  . Colon cancer Father 22  . Cancer Sister        cervical, breast  . Thyroid disease Sister   . Heart disease Brother   . Thyroid disease Maternal Grandfather   . Cancer Maternal Grandfather   . Diabetes Paternal Grandmother   . Cancer Paternal Grandmother         breast  . Heart disease Paternal Grandmother   . Heart disease Paternal Grandfather   . Diabetes Paternal Grandfather   . Hypertension Paternal Grandfather   . Thyroid disease Sister   . Diabetes Sister   . Diabetes Sister   . Hypertension Sister   . Heart disease Maternal Grandmother   . Thyroid disease Maternal Grandmother   . Cancer Sister        kidney  . Asthma Daughter   . Allergies Daughter   . Lung disease Son        recurrent bronchitis  . Lung disease Daughter        recurrent bronchitis  . Cancer Paternal Aunt        breast cancer     Current Outpatient Medications:  .  acetaminophen (TYLENOL) 500 MG tablet, Take 1,000 mg by mouth every 8 (eight) hours as needed., Disp: , Rfl:  .  albuterol (VENTOLIN HFA) 108 (90 Base) MCG/ACT inhaler, Inhale 2 puffs into the lungs every 6 (six) hours as needed for wheezing or shortness of breath., Disp: 1 Inhaler, Rfl: 0 .  atorvastatin (LIPITOR) 80 MG tablet, TAKE 1 TABLET (80 MG TOTAL) BY MOUTH DAILY., Disp: 90 tablet, Rfl: 1 .  azelastine (ASTELIN) 0.1 % nasal spray, , Disp: , Rfl:  .  cyclobenzaprine (FLEXERIL) 10 MG tablet, 1 tab po tid prn, Disp: 90 tablet, Rfl: 5 .  diphenhydramine-acetaminophen (TYLENOL PM) 25-500 MG TABS, Take 1 tablet by mouth at bedtime as needed., Disp: , Rfl:  .  DULoxetine (CYMBALTA) 60 MG capsule, TAKE 1 CAPSULE BY MOUTH EVERY DAY, Disp: 90 capsule, Rfl: 1 .  gabapentin (NEURONTIN) 300 MG capsule, TAKE 2 CAPSULES BY MOUTH THREE TIMES A DAY, Disp: 540 capsule, Rfl: 3 .  losartan (COZAAR) 100 MG tablet, TAKE 1 TABLET BY MOUTH EVERY DAY, Disp: 90 tablet, Rfl: 1 .  Multiple Vitamin (MULTIVITAMIN WITH MINERALS) TABS, Take 1 tablet by mouth daily., Disp: , Rfl:  .  OVER THE COUNTER MEDICATION, Slimvance - for weight loss, Disp: , Rfl:  .  oxyCODONE (OXY IR/ROXICODONE) 5 MG immediate release tablet, 1-2 tabs po q6h prn, Disp: 60 tablet, Rfl: 0 .  pantoprazole (PROTONIX) 40 MG tablet, Take 1 tablet (40 mg  total) by mouth daily., Disp: 90 tablet, Rfl: 1 .  promethazine (PHENERGAN) 12.5 MG tablet, 1-2 tabs po q6h prn nausea, Disp: 30 tablet, Rfl: 0 .  sennosides-docusate sodium (SENOKOT-S) 8.6-50 MG tablet, Take 1 tablet by mouth 2 (two) times daily. While taking pain meds to prevent constipation, Disp: 30 tablet, Rfl: 0 .  Wheat Dextrin (BENEFIBER PO), Take by mouth., Disp: , Rfl:  .  meloxicam (MOBIC) 15 MG tablet, 1 tab po qd prn pain (Patient not taking: Reported on 10/28/2018), Disp: 30 tablet, Rfl: 2 .  psyllium (METAMUCIL) 58.6 % powder, Take 1 packet by mouth as needed., Disp: ,  Rfl:   EXAM:  VITALS per patient if applicable:  GENERAL: alert, oriented, appears well and in no acute distress  HEENT: atraumatic, conjunttiva clear, no obvious abnormalities on inspection of external nose and ears  NECK: normal movements of the head and neck  LUNGS: on inspection no signs of respiratory distress, breathing rate appears normal, no obvious gross SOB, gasping or wheezing  CV: no obvious cyanosis  MS: moves all visible extremities without noticeable abnormality  PSYCH/NEURO: pleasant and cooperative, no obvious depression or anxiety, speech and thought processing grossly intact  LABS: none today  Lab Results  Component Value Date   TSH 0.90 04/22/2018   Lab Results  Component Value Date   WBC 9.0 04/22/2018   HGB 12.5 04/22/2018   HCT 35.1 04/22/2018   MCV 89.5 04/22/2018   PLT 275 04/22/2018   Lab Results  Component Value Date   CREATININE 0.98 04/22/2018   BUN 15 04/22/2018   NA 139 04/22/2018   K 4.1 04/22/2018   CL 103 04/22/2018   CO2 29 04/22/2018   Lab Results  Component Value Date   ALT 11 04/22/2018   AST 13 04/22/2018   ALKPHOS 101 07/09/2016   BILITOT 0.5 04/22/2018   Lab Results  Component Value Date   CHOL 159 04/22/2018   Lab Results  Component Value Date   HDL 65 04/22/2018   Lab Results  Component Value Date   LDLCALC 70 04/22/2018   Lab  Results  Component Value Date   TRIG 154 (H) 04/22/2018   Lab Results  Component Value Date   CHOLHDL 2.4 04/22/2018   Lab Results  Component Value Date   HGBA1C 5.5 04/22/2018    ASSESSMENT AND PLAN:  Discussed the following assessment and plan:  1) Chronic pain syndrome: no change in regimen.   Bilat knee osteoarthritis: at next f/u in the office, will update bilat knee x-rays to see if sign of progression of her osteoarthritis. Will likely do steroid injections in knees at that time as well. I did electronic rx's for oxycodone 5mg , 1-2 q6hprn, #60 today for this month, May 2020, and June 2020.  Appropriate fill on/after date was noted on each rx.  2) Acute sinusitis: symptomatic care discussed. Keflex 500 mg tid x 10d.  3) UTI symptoms : cephalexin as in #2 above. If sx's persist despite abx then she'll have to give urine specimen for UA and c/s.   I discussed the assessment and treatment plan with the patient. The patient was provided an opportunity to ask questions and all were answered. The patient agreed with the plan and demonstrated an understanding of the instructions.   The patient was advised to call back or seek an in-person evaluation if the symptoms worsen or if the condition fails to improve as anticipated.  F/u: 3 mo -- pain, knees, etc  Signed:  Crissie Sickles, MD           10/28/2018

## 2018-11-09 ENCOUNTER — Ambulatory Visit: Payer: Self-pay | Admitting: Family Medicine

## 2018-11-09 NOTE — Telephone Encounter (Signed)
Patient calling to request a prescription for a yeast infection to be sent to [facility] in [place]. Pt has complaints of vaginal itching with white, cheesy discharge and has some burning with urination. Pt states that the discharge does have a different smell but does not describe it as foul at this time. Pt states that she usually gets a yeast infection with taking antibiotics and has been experiencing current symptoms for about a week. Pt had a virtual visit on 10/28/18 and was prescribed Cephalexin. Pt given home care advice and notified that PCP would be forwarded information provided during triage call. Pt can be contacted at [phone]. Reason for Disposition . [1] Symptoms of a yeast infection (i.e., itchy, white discharge, not bad smelling) AND    [2] not improved > 3 days following CARE ADVICE . [1] Symptoms of a yeast infection (i.e., itchy, white discharge, not bad smelling) AND    [2] feels like prior vaginal yeast infections  Answer Assessment - Initial Assessment Questions 1. SYMPTOM: "What's the main symptom you're concerned about?" (e.g., pain, itching, dryness)     itching 2. LOCATION: "Where is the  itching located?" (e.g., inside/outside, left/right)     Vaginal area 3. ONSET: "When did the  itching  start?"     A week ago 4. PAIN: "Is there any pain?" If so, ask: "How bad is it?" (Scale: 1-10; mild, moderate, severe)     No complaints of pain voiced at this time 5. ITCHING: "Is there any itching?" If so, ask: "How bad is it?" (Scale: 1-10; mild, moderate, severe)     Yes had itching, severity not specified 6. CAUSE: "What do you think is causing the discharge?" "Have you had the same problem before? What happened then?"     Yeast infection 7. OTHER SYMPTOMS: "Do you have any other symptoms?" (e.g., fever, itching, vaginal bleeding, pain with urination, injury to genital area, vaginal foreign body)     Itching, burning with urination, white cheesy discharge with a smell 8.  PREGNANCY: "Is there any chance you are pregnant?" "When was your last menstrual period?"     n/a  Protocols used: [facility]

## 2018-11-10 NOTE — Telephone Encounter (Signed)
Noted. thx 

## 2018-11-10 NOTE — Telephone Encounter (Signed)
Pt was called and scheduled a virtual visit

## 2018-11-11 ENCOUNTER — Encounter: Payer: Self-pay | Admitting: Family Medicine

## 2018-11-11 ENCOUNTER — Other Ambulatory Visit: Payer: Self-pay

## 2018-11-11 ENCOUNTER — Ambulatory Visit (INDEPENDENT_AMBULATORY_CARE_PROVIDER_SITE_OTHER): Payer: BLUE CROSS/BLUE SHIELD | Admitting: Family Medicine

## 2018-11-11 VITALS — BP 157/91 | Temp 96.4°F

## 2018-11-11 DIAGNOSIS — B373 Candidiasis of vulva and vagina: Secondary | ICD-10-CM | POA: Diagnosis not present

## 2018-11-11 DIAGNOSIS — B3731 Acute candidiasis of vulva and vagina: Secondary | ICD-10-CM

## 2018-11-11 MED ORDER — FLUCONAZOLE 150 MG PO TABS: 150.0000 mg | ORAL_TABLET | Freq: Every day | ORAL | 1 refills | Status: DC

## 2018-11-11 NOTE — Progress Notes (Signed)
Virtual Visit via Video Note  I connected with pt  on 11/11/18 at 10:00 AM EDT by a video enabled telemedicine application and verified that I am speaking with the correct person using two identifiers.  Location patient: home Location provider:work or home office Persons participating in the virtual visit: patient, provider  I discussed the limitations of evaluation and management by telemedicine and the availability of in person appointments. The patient expressed understanding and agreed to proceed.   HPI: 63 y/o WF being seen today for vaginal d/c. I treated her with keflex 500 mg tid x 10d when I saw her 2 weeks ago-->acute sinusitis and acute UTI sx's (no urine specimen obtained at that time).  Interim hx: About 7-10 d of Having white, cottage cheese-like dischange, lots of burning and itching in vag area.  No otc meds tried.   ROS: See pertinent positives and negatives per HPI.  Past Medical History:  Diagnosis Date  . Bilateral carpal tunnel syndrome    pt to get bilat carpal tunnel release as of 09/2018 ortho eval  . BPPV (benign paroxysmal positional vertigo)   . Chronic pain syndrome   . Chronic renal insufficiency, stage III (moderate) (HCC)    GFR 50's  . Fibromyalgia   . GERD (gastroesophageal reflux disease)   . Heart murmur   . Hemorrhoids   . History of bronchitis    childhood, wood stove and coal heat  . History of galactorrhea 02/05/2011  . Hyperlipidemia, mixed   . Hypertension   . IFG (impaired fasting glucose) 2016   HbA1c 5.7-5.8.  Marland Kitchen Knee pain, bilateral 02/05/2011  . Lumbar spondylosis   . Overweight(278.02)   . 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 Wyatt Mage sign of recurrent dz as of this 05/2016 f/u.  Released from surveillance as of 06/03/16; return to urol prn.  . Seasonal allergic rhinitis   . Solitary left kidney 2013   Aquired: right nephrectomy for renal cancer    Past Surgical History:  Procedure Laterality Date  . CESAREAN SECTION  1983  . COLONOSCOPY  04/2013   Normal.  Repeat 2019 ([facility]  . ROBOT ASSISTED LAPAROSCOPIC NEPHRECTOMY  07/12/2012   Procedure: ROBOTIC ASSISTED LAPAROSCOPIC NEPHRECTOMY;  Surgeon: Alexis Frock, MD;  Location: [facility];  Service: Urology;  Laterality: Right;  . TONSILLECTOMY    . TUBAL LIGATION      Family History  Problem Relation Age of Onset  . Thyroid disease Mother   . Heart disease Mother        CHF, heart murmur, PAD  . Kidney disease Mother        RAS  . Other Mother 86       bowel obstruction,surgery this year  . Colon cancer Father 82  . Cancer Sister        cervical, breast  . Thyroid disease Sister   . Heart disease Brother   . Thyroid disease Maternal Grandfather   . Cancer Maternal Grandfather   . Diabetes Paternal Grandmother   . Cancer Paternal Grandmother        breast  . Heart disease Paternal Grandmother   . Heart disease Paternal Grandfather   . Diabetes Paternal Grandfather   . Hypertension Paternal Grandfather   . Thyroid disease Sister   . Diabetes Sister   . Diabetes Sister   . Hypertension Sister   . Heart disease Maternal Grandmother   .  Thyroid disease Maternal Grandmother   . Cancer Sister        kidney  . Asthma Daughter   . Allergies Daughter   . Lung disease Son        recurrent bronchitis  . Lung disease Daughter        recurrent bronchitis  . Cancer Paternal Aunt        breast cancer     Current Outpatient Medications:  .  acetaminophen (TYLENOL) 500 MG tablet, Take 1,000 mg by mouth every 8 (eight) hours as needed., Disp: , Rfl:  .  albuterol (VENTOLIN HFA) 108 (90 Base) MCG/ACT inhaler, Inhale 2 puffs into the lungs every 6 (six) hours as needed for wheezing or shortness of breath., Disp: 1 Inhaler, Rfl: 0 .  atorvastatin (LIPITOR) 80 MG tablet, TAKE 1 TABLET (80 MG TOTAL) BY MOUTH DAILY., Disp: 90  tablet, Rfl: 1 .  azelastine (ASTELIN) 0.1 % nasal spray, , Disp: , Rfl:  .  cephALEXin (KEFLEX) 500 MG capsule, Take 1 capsule (500 mg total) by mouth 3 (three) times daily., Disp: 30 capsule, Rfl: 0 .  cyclobenzaprine (FLEXERIL) 10 MG tablet, 1 tab po tid prn, Disp: 90 tablet, Rfl: 5 .  diphenhydramine-acetaminophen (TYLENOL PM) 25-500 MG TABS, Take 1 tablet by mouth at bedtime as needed., Disp: , Rfl:  .  DULoxetine (CYMBALTA) 60 MG capsule, TAKE 1 CAPSULE BY MOUTH EVERY DAY, Disp: 90 capsule, Rfl: 1 .  gabapentin (NEURONTIN) 300 MG capsule, TAKE 2 CAPSULES BY MOUTH THREE TIMES A DAY, Disp: 540 capsule, Rfl: 3 .  losartan (COZAAR) 100 MG tablet, TAKE 1 TABLET BY MOUTH EVERY DAY, Disp: 90 tablet, Rfl: 1 .  meloxicam (MOBIC) 15 MG tablet, 1 tab po qd prn pain (Patient not taking: Reported on 10/28/2018), Disp: 30 tablet, Rfl: 2 .  Multiple Vitamin (MULTIVITAMIN WITH MINERALS) TABS, Take 1 tablet by mouth daily., Disp: , Rfl:  .  OVER THE COUNTER MEDICATION, Slimvance - for weight loss, Disp: , Rfl:  .  oxyCODONE (OXY IR/ROXICODONE) 5 MG immediate release tablet, 1-2 tabs po q6h prn, Disp: 60 tablet, Rfl: 0 .  pantoprazole (PROTONIX) 40 MG tablet, Take 1 tablet (40 mg total) by mouth daily., Disp: 90 tablet, Rfl: 1 .  promethazine (PHENERGAN) 12.5 MG tablet, 1-2 tabs po q6h prn nausea, Disp: 30 tablet, Rfl: 0 .  psyllium (METAMUCIL) 58.6 % powder, Take 1 packet by mouth as needed., Disp: , Rfl:  .  sennosides-docusate sodium (SENOKOT-S) 8.6-50 MG tablet, Take 1 tablet by mouth 2 (two) times daily. While taking pain meds to prevent constipation, Disp: 30 tablet, Rfl: 0 .  Wheat Dextrin (BENEFIBER PO), Take by mouth., Disp: , Rfl:   EXAM:  VITALS per patient if applicable:  GENERAL: alert, oriented, appears well and in no acute distress  HEENT: atraumatic, conjunttiva clear, no obvious abnormalities on inspection of external nose and ears  NECK: normal movements of the head and neck  LUNGS:  on inspection no signs of respiratory distress, breathing rate appears normal, no obvious gross SOB, gasping or wheezing  CV: no obvious cyanosis  MS: moves all visible extremities without noticeable abnormality  PSYCH/NEURO: pleasant and cooperative, no obvious depression or anxiety, speech and thought processing grossly intact  LABS: none today    Chemistry      Component Value Date/Time   NA 139 04/22/2018 1526   K 4.1 04/22/2018 1526   CL 103 04/22/2018 1526   CO2 29 04/22/2018 1526  BUN 15 04/22/2018 1526   CREATININE 0.98 04/22/2018 1526      Component Value Date/Time   CALCIUM 9.3 04/22/2018 1526   ALKPHOS 101 07/09/2016 1020   AST 13 04/22/2018 1526   ALT 11 04/22/2018 1526   BILITOT 0.5 04/22/2018 1526      ASSESSMENT AND PLAN:  Discussed the following assessment and plan:  Yeast vaginitis. Fluconazole 150mg  qd x 2d.  RF x 1. Signs/symptoms to call or return for were reviewed and pt expressed understanding.   I discussed the assessment and treatment plan with the patient. The patient was provided an opportunity to ask questions and all were answered. The patient agreed with the plan and demonstrated an understanding of the instructions.   The patient was advised to call back or seek an in-person evaluation if the symptoms worsen or if the condition fails to improve as anticipated.  F/u: as needed.  Signed:  Crissie Sickles, MD           11/11/2018

## 2018-12-28 ENCOUNTER — Other Ambulatory Visit: Payer: Self-pay | Admitting: Family Medicine

## 2019-01-04 ENCOUNTER — Other Ambulatory Visit: Payer: Self-pay | Admitting: Family Medicine

## 2019-01-05 NOTE — Telephone Encounter (Signed)
RF request for flexeril.  Last OV 11/11/2018  No upcoming Appt. Last RX 09/17/2017 # 90 X 5 rfs.  Please advise.

## 2019-01-07 DIAGNOSIS — Z20828 Contact with and (suspected) exposure to other viral communicable diseases: Secondary | ICD-10-CM | POA: Diagnosis not present

## 2019-01-18 ENCOUNTER — Other Ambulatory Visit: Payer: Self-pay

## 2019-01-18 MED ORDER — ATORVASTATIN CALCIUM 80 MG PO TABS: 80.0000 mg | ORAL_TABLET | Freq: Every day | ORAL | 0 refills | Status: DC

## 2019-01-27 ENCOUNTER — Encounter: Payer: Self-pay | Admitting: Family Medicine

## 2019-01-27 ENCOUNTER — Other Ambulatory Visit: Payer: Self-pay

## 2019-01-27 ENCOUNTER — Ambulatory Visit (INDEPENDENT_AMBULATORY_CARE_PROVIDER_SITE_OTHER): Payer: BC Managed Care – PPO | Admitting: Family Medicine

## 2019-01-27 VITALS — BP 126/81 | HR 98 | Wt 190.0 lb

## 2019-01-27 DIAGNOSIS — Z79899 Other long term (current) drug therapy: Secondary | ICD-10-CM | POA: Diagnosis not present

## 2019-01-27 DIAGNOSIS — G894 Chronic pain syndrome: Secondary | ICD-10-CM

## 2019-01-27 DIAGNOSIS — M170 Bilateral primary osteoarthritis of knee: Secondary | ICD-10-CM | POA: Diagnosis not present

## 2019-01-27 DIAGNOSIS — M797 Fibromyalgia: Secondary | ICD-10-CM

## 2019-01-27 DIAGNOSIS — I10 Essential (primary) hypertension: Secondary | ICD-10-CM

## 2019-01-27 MED ORDER — OXYCODONE HCL 5 MG PO TABS: ORAL_TABLET | ORAL | 0 refills | Status: DC

## 2019-01-27 NOTE — Progress Notes (Signed)
Virtual Visit via Video Note  I connected with [name] on 01/27/19 at  4:00 PM EDT by a video enabled telemedicine application and verified that I am speaking with the correct person using two identifiers.  Location patient: home Location provider:work or home office Persons participating in the virtual visit: patient, provider  I discussed the limitations of evaluation and management by telemedicine and the availability of in person appointments. The patient expressed understanding and agreed to proceed.  Telemedicine visit is a necessity given the COVID-19 restrictions in place at the current time.  HPI: 63 y/o WF being seen today for 3 mo f/u chronic pain and HTN.  Indication for chronic opioid:chronic lumbar and bilat knee osteoarthritis + fibromyalgia pain that I treat with pain meds in order to maximize functioning and quality of life. Medication and dose:oxycodone 5mg  # pills per month: 60 Last UDS date:04/22/18 Opioid Treatment Agreement signed (Y/N):Y, 04/22/18 Opioid Treatment Agreement last reviewed with patient:today [facility] reviewed this encounter (include red flags):today, no red flags. Most recent fill of oxycodone was 01/04/19, rx'd by me, #60.  Interim hx: R wrist feeling a lot better since her CT release surgery.  Not back to work yet, though.  Taking 3-4 oxycodone per day b/c she is walking a lot more lately.  Both knees are her biggest pain regions.  No redness.  +Intermittent mild swelling.  Mild instability, ++ stiffness.  Fibro pain relatively stable lately, not significant issue like her knees. No adverse effects from the pain meds. No longer taking meloxicam.  Home bp's consistently <130/80.  ROS: no CP, no SOB, no wheezing, no cough, no dizziness, no HAs, no rashes, no melena/hematochezia.  No polyuria or polydipsia.   Past Medical History:  Diagnosis Date  . Bilateral carpal tunnel syndrome    [name] to get bilat carpal tunnel release as of 09/2018 ortho  eval  . BPPV (benign paroxysmal positional vertigo)   . Chronic pain syndrome   . Chronic renal insufficiency, stage III (moderate) (HCC)    GFR 50's  . Fibromyalgia   . GERD (gastroesophageal reflux disease)   . Heart murmur   . Hemorrhoids   . History of bronchitis    childhood, wood stove and coal heat  . History of galactorrhea 02/05/2011  . Hyperlipidemia, mixed   . Hypertension   . IFG (impaired fasting glucose) 2016   HbA1c 5.7-5.8.  Marland Kitchen Knee pain, bilateral 02/05/2011  . Lumbar spondylosis   . Overweight(278.02)   . 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 Wyatt Mage sign of recurrent dz as of this 05/2016 f/u.  Released from surveillance as of 06/03/16; return to urol prn.  . Seasonal allergic rhinitis   . Solitary left kidney 2013   Aquired: right nephrectomy for renal cancer    Past Surgical History:  Procedure Laterality Date  . CARPAL TUNNEL RELEASE Right 09/2018  . CESAREAN SECTION  1983  . COLONOSCOPY  04/2013   Normal.  Repeat 2019 ([facility]  . ROBOT ASSISTED LAPAROSCOPIC NEPHRECTOMY  07/12/2012   Procedure: ROBOTIC ASSISTED LAPAROSCOPIC NEPHRECTOMY;  Surgeon: Alexis Frock, MD;  Location: [facility];  Service: Urology;  Laterality: Right;  . TONSILLECTOMY    . TUBAL LIGATION      Family History  Problem Relation Age of Onset  . Thyroid disease Mother   . Heart disease Mother        CHF, heart murmur,  PAD  . Kidney disease Mother        RAS  . Other Mother 37       bowel obstruction,surgery this year  . Colon cancer Father 20  . Cancer Sister        cervical, breast  . Thyroid disease Sister   . Heart disease Brother   . Thyroid disease Maternal Grandfather   . Cancer Maternal Grandfather   . Diabetes Paternal Grandmother   . Cancer Paternal Grandmother        breast  . Heart disease Paternal Grandmother   . Heart disease Paternal  Grandfather   . Diabetes Paternal Grandfather   . Hypertension Paternal Grandfather   . Thyroid disease Sister   . Diabetes Sister   . Diabetes Sister   . Hypertension Sister   . Heart disease Maternal Grandmother   . Thyroid disease Maternal Grandmother   . Cancer Sister        kidney  . Asthma Daughter   . Allergies Daughter   . Lung disease Son        recurrent bronchitis  . Lung disease Daughter        recurrent bronchitis  . Cancer Paternal Aunt        breast cancer      Current Outpatient Medications:  .  acetaminophen (TYLENOL) 500 MG tablet, Take 1,000 mg by mouth every 8 (eight) hours as needed., Disp: , Rfl:  .  atorvastatin (LIPITOR) 80 MG tablet, Take 1 tablet (80 mg total) by mouth daily., Disp: 90 tablet, Rfl: 0 .  azelastine (ASTELIN) 0.1 % nasal spray, , Disp: , Rfl:  .  cyclobenzaprine (FLEXERIL) 10 MG tablet, TAKE 1 TABLET BY MOUTH THREE TIMES A DAY AS NEEDED, Disp: 90 tablet, Rfl: 5 .  DULoxetine (CYMBALTA) 60 MG capsule, TAKE 1 CAPSULE BY MOUTH EVERY DAY, Disp: 90 capsule, Rfl: 0 .  fluconazole (DIFLUCAN) 150 MG tablet, Take 1 tablet (150 mg total) by mouth daily., Disp: 2 tablet, Rfl: 1 .  gabapentin (NEURONTIN) 300 MG capsule, TAKE 2 CAPSULES BY MOUTH THREE TIMES A DAY, Disp: 540 capsule, Rfl: 3 .  losartan (COZAAR) 100 MG tablet, TAKE 1 TABLET BY MOUTH EVERY DAY, Disp: 90 tablet, Rfl: 1 .  Multiple Vitamin (MULTIVITAMIN WITH MINERALS) TABS, Take 1 tablet by mouth daily., Disp: , Rfl:  .  OVER THE COUNTER MEDICATION, Slimvance - for weight loss, Disp: , Rfl:  .  oxyCODONE (OXY IR/ROXICODONE) 5 MG immediate release tablet, 1-2 tabs po q6h prn, Disp: 60 tablet, Rfl: 0 .  pantoprazole (PROTONIX) 40 MG tablet, Take 1 tablet (40 mg total) by mouth daily., Disp: 90 tablet, Rfl: 1 .  promethazine (PHENERGAN) 12.5 MG tablet, 1-2 tabs po q6h prn nausea, Disp: 30 tablet, Rfl: 0 .  psyllium (METAMUCIL) 58.6 % powder, Take 1 packet by mouth as needed., Disp: , Rfl:  .   sennosides-docusate sodium (SENOKOT-S) 8.6-50 MG tablet, Take 1 tablet by mouth 2 (two) times daily. While taking pain meds to prevent constipation, Disp: 30 tablet, Rfl: 0 .  Wheat Dextrin (BENEFIBER PO), Take by mouth., Disp: , Rfl:  .  albuterol (VENTOLIN HFA) 108 (90 Base) MCG/ACT inhaler, Inhale 2 puffs into the lungs every 6 (six) hours as needed for wheezing or shortness of breath. (Patient not taking: Reported on 01/27/2019), Disp: 1 Inhaler, Rfl: 0 .  diphenhydramine-acetaminophen (TYLENOL PM) 25-500 MG TABS, Take 1 tablet by mouth at bedtime as needed., Disp: , Rfl:  .  meloxicam (MOBIC) 15 MG tablet, 1 tab po qd prn pain (Patient not taking: Reported on 01/27/2019), Disp: 30 tablet, Rfl: 2  EXAM:  VITALS per patient if applicable: BP 637/85 (BP Location: Left Arm, Patient Position: Sitting, Cuff Size: Large)   Pulse 98   Wt 190 lb (86.2 kg)   BMI 33.93 kg/m    GENERAL: alert, oriented, appears well and in no acute distress  HEENT: atraumatic, conjunttiva clear, no obvious abnormalities on inspection of external nose and ears  NECK: normal movements of the head and neck  LUNGS: on inspection no signs of respiratory distress, breathing rate appears normal, no obvious gross SOB, gasping or wheezing  CV: no obvious cyanosis  MS: moves all visible extremities without noticeable abnormality  PSYCH/NEURO: pleasant and cooperative, no obvious depression or anxiety, speech and thought processing grossly intact  LABS: none today  Lab Results  Component Value Date   TSH 0.90 04/22/2018   Lab Results  Component Value Date   WBC 9.0 04/22/2018   HGB 12.5 04/22/2018   HCT 35.1 04/22/2018   MCV 89.5 04/22/2018   PLT 275 04/22/2018   Lab Results  Component Value Date   CREATININE 0.98 04/22/2018   BUN 15 04/22/2018   NA 139 04/22/2018   K 4.1 04/22/2018   CL 103 04/22/2018   CO2 29 04/22/2018   Lab Results  Component Value Date   ALT 11 04/22/2018   AST 13 04/22/2018    ALKPHOS 101 07/09/2016   BILITOT 0.5 04/22/2018   Lab Results  Component Value Date   CHOL 159 04/22/2018   Lab Results  Component Value Date   HDL 65 04/22/2018   Lab Results  Component Value Date   LDLCALC 70 04/22/2018   Lab Results  Component Value Date   TRIG 154 (H) 04/22/2018   Lab Results  Component Value Date   CHOLHDL 2.4 04/22/2018   Lab Results  Component Value Date   HGBA1C 5.5 04/22/2018   ASSESSMENT AND PLAN:  Discussed the following assessment and plan:  1) Chronic pain syndrome: fibromyalgia relatively quiescent. Bilat knee osteoarthritic pain is biggest issue lately. Will repeat plain films of knees to compare to 2015 when the last plain films were done. She does not want to consider surgical treatment (if this is an option) at this time, but this may change in the future.  For now, will continue with current pain mgmt meds, with the only change being adding 10 pills per month to her dispensed amount (from #60 to #70 each month). I did electronic rx's for oxycodone 5mg , 1-2 qid prn, #70 today for this month, August 2020, and Sept 2020.  Appropriate fill on/after date was noted on each rx.  2) HTN: The current medical regimen is effective;  continue present plan and medications. Lytes/cr at next f/u in 3 mo for her CPE.   I discussed the assessment and treatment plan with the patient. The patient was provided an opportunity to ask questions and all were answered. The patient agreed with the plan and demonstrated an understanding of the instructions.   The patient was advised to call back or seek an in-person evaluation if the symptoms worsen or if the condition fails to improve as anticipated.  F/u: 3 mo cpe with labs + UDS and renewal of CSC.  Signed:  Crissie Sickles, MD           01/27/2019

## 2019-02-03 ENCOUNTER — Other Ambulatory Visit: Payer: Self-pay | Admitting: Family Medicine

## 2019-02-04 ENCOUNTER — Other Ambulatory Visit: Payer: Self-pay

## 2019-02-04 ENCOUNTER — Ambulatory Visit (HOSPITAL_COMMUNITY)
Admission: RE | Admit: 2019-02-04 | Discharge: 2019-02-04 | Disposition: A | Discharge status: Home / Self Care | Payer: BC Managed Care – PPO | Source: Ambulatory Visit | Attending: Family Medicine | Admitting: Family Medicine

## 2019-02-04 DIAGNOSIS — M25561 Pain in right knee: Secondary | ICD-10-CM | POA: Diagnosis not present

## 2019-02-04 DIAGNOSIS — M170 Bilateral primary osteoarthritis of knee: Secondary | ICD-10-CM | POA: Insufficient documentation

## 2019-02-04 DIAGNOSIS — M1712 Unilateral primary osteoarthritis, left knee: Secondary | ICD-10-CM | POA: Diagnosis not present

## 2019-02-04 NOTE — Telephone Encounter (Signed)
Pharm sent [place] a message that they are out of 100mg  losartan. Is it okay for patient to take 2 50mg  tabs? Please advise change , thank you

## 2019-02-08 ENCOUNTER — Telehealth: Payer: Self-pay

## 2019-02-08 NOTE — Telephone Encounter (Signed)
MyChart message sent with results.  

## 2019-02-11 ENCOUNTER — Other Ambulatory Visit: Payer: Self-pay

## 2019-02-11 ENCOUNTER — Ambulatory Visit: Payer: BC Managed Care – PPO | Admitting: Family Medicine

## 2019-02-11 ENCOUNTER — Encounter: Payer: Self-pay | Admitting: Family Medicine

## 2019-02-11 VITALS — BP 144/79 | HR 74 | Temp 98.1°F | Resp 16 | Ht 65.0 in | Wt 193.4 lb

## 2019-02-11 DIAGNOSIS — M170 Bilateral primary osteoarthritis of knee: Secondary | ICD-10-CM | POA: Diagnosis not present

## 2019-02-11 MED ORDER — METHYLPREDNISOLONE ACETATE 80 MG/ML IJ SUSP
80.0000 mg | Freq: Once | INTRAMUSCULAR | Status: AC
  Administered 2019-02-11: 14:00:00 80 mg via INTRAMUSCULAR

## 2019-02-11 NOTE — Progress Notes (Signed)
OFFICE VISIT  02/11/2019   CC:  Chief Complaint  Patient presents with  . Knee Pain    bilateral   HPI:    Patient is a 63 y.o. Caucasian female who presents for bilat knee pain, chronic, secondary to osteoarthritis. She desires steroid injection in knees today. Getting ready to go back to work in a few days.  No acute swelling of either knee.  No redness or warmth. The pain sometimes is acutely worsened with standing and walking, shoots up distal thigh at times. Will be standing most of her shift--a job at a [employer].  Most recent steroid injection in knees was >>1 yr ago.  ROS: no recent fevers or malaise.  Past Medical History:  Diagnosis Date  . Bilateral carpal tunnel syndrome    pt to get bilat carpal tunnel release as of 09/2018 ortho eval  . BPPV (benign paroxysmal positional vertigo)   . Chronic pain syndrome   . Chronic renal insufficiency, stage III (moderate) (HCC)    GFR 50's  . Fibromyalgia   . GERD (gastroesophageal reflux disease)   . Heart murmur   . Hemorrhoids   . History of bronchitis    childhood, wood stove and coal heat  . History of galactorrhea 02/05/2011  . Hyperlipidemia, mixed   . Hypertension   . IFG (impaired fasting glucose) 2016   HbA1c 5.7-5.8.  Marland Kitchen Knee pain, bilateral 02/05/2011  . Lumbar spondylosis   . Overweight(278.02)   . 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 Wyatt Mage sign of recurrent dz as of this 05/2016 f/u.  Released from surveillance as of 06/03/16; return to urol prn.  . Seasonal allergic rhinitis   . Solitary left kidney 2013   Aquired: right nephrectomy for renal cancer    Past Surgical History:  Procedure Laterality Date  . CARPAL TUNNEL RELEASE Right 09/2018  . CESAREAN SECTION  1983  . COLONOSCOPY  04/2013   Normal.  Repeat 2019 ([facility]  . ROBOT ASSISTED LAPAROSCOPIC NEPHRECTOMY  07/12/2012    Procedure: ROBOTIC ASSISTED LAPAROSCOPIC NEPHRECTOMY;  Surgeon: Alexis Frock, MD;  Location: [facility];  Service: Urology;  Laterality: Right;  . TONSILLECTOMY    . TUBAL LIGATION      Outpatient Medications Prior to Visit  Medication Sig Dispense Refill  . acetaminophen (TYLENOL) 500 MG tablet Take 1,000 mg by mouth every 8 (eight) hours as needed.    Marland Kitchen atorvastatin (LIPITOR) 80 MG tablet Take 1 tablet (80 mg total) by mouth daily. 90 tablet 0  . azelastine (ASTELIN) 0.1 % nasal spray     . cyclobenzaprine (FLEXERIL) 10 MG tablet TAKE 1 TABLET BY MOUTH THREE TIMES A DAY AS NEEDED 90 tablet 5  . diphenhydramine-acetaminophen (TYLENOL PM) 25-500 MG TABS Take 1 tablet by mouth at bedtime as needed.    . DULoxetine (CYMBALTA) 60 MG capsule TAKE 1 CAPSULE BY MOUTH EVERY DAY 90 capsule 0  . gabapentin (NEURONTIN) 300 MG capsule TAKE 2 CAPSULES BY MOUTH THREE TIMES A DAY 540 capsule 3  . losartan (COZAAR) 50 MG tablet TAKE 2 TABLETS (100MG  TOTAL DOSE) BY MOUTH DAILY 180 tablet 3  . Multiple Vitamin (MULTIVITAMIN WITH MINERALS) TABS Take 1 tablet by mouth daily.    Marland Kitchen OVER THE COUNTER MEDICATION Slimvance - for weight loss    . oxyCODONE (OXY IR/ROXICODONE) 5 MG immediate release tablet 1-2 tabs po q6h prn 70 tablet  0  . pantoprazole (PROTONIX) 40 MG tablet Take 1 tablet (40 mg total) by mouth daily. 90 tablet 1  . promethazine (PHENERGAN) 12.5 MG tablet 1-2 tabs po q6h prn nausea 30 tablet 0  . psyllium (METAMUCIL) 58.6 % powder Take 1 packet by mouth as needed.    . sennosides-docusate sodium (SENOKOT-S) 8.6-50 MG tablet Take 1 tablet by mouth 2 (two) times daily. While taking pain meds to prevent constipation 30 tablet 0  . Wheat Dextrin (BENEFIBER PO) Take by mouth.    Marland Kitchen albuterol (VENTOLIN HFA) 108 (90 Base) MCG/ACT inhaler Inhale 2 puffs into the lungs every 6 (six) hours as needed for wheezing or shortness of breath. (Patient not taking: Reported on 01/27/2019) 1 Inhaler 0  . meloxicam  (MOBIC) 15 MG tablet 1 tab po qd prn pain (Patient not taking: Reported on 01/27/2019) 30 tablet 2  . fluconazole (DIFLUCAN) 150 MG tablet Take 1 tablet (150 mg total) by mouth daily. (Patient not taking: Reported on 02/11/2019) 2 tablet 1   No facility-administered medications prior to visit.     Allergies  Allergen Reactions  . Codeine Nausea Only  . Dilaudid [Hydromorphone Hcl] Nausea And Vomiting    ROS As per HPI  PE: Blood pressure (!) 144/79, pulse 74, temperature 98.1 F (36.7 C), temperature source Temporal, resp. rate 16, height 5\' 5"  (1.651 m), weight 193 lb 6.4 oz (87.7 kg), SpO2 96 %. Gen: Alert, well appearing.  Patient is oriented to person, place, time, and situation. AFFECT: pleasant, lucid thought and speech. Knees: There is no erythema or warmth on R.  Left with mild warmth but no erythema. Mostly some excess soft tissue about the knees, possibly mild effusion on L >R. Bilat crepitus noted.  Full extension and flexion is obtained but with significant bilat knee pain. Knees with mild diffuse palpable tenderness. LL's w/out pitting.  LABS:    Chemistry      Component Value Date/Time   NA 139 04/22/2018 1526   K 4.1 04/22/2018 1526   CL 103 04/22/2018 1526   CO2 29 04/22/2018 1526   BUN 15 04/22/2018 1526   CREATININE 0.98 04/22/2018 1526      Component Value Date/Time   CALCIUM 9.3 04/22/2018 1526   ALKPHOS 101 07/09/2016 1020   AST 13 04/22/2018 1526   ALT 11 04/22/2018 1526   BILITOT 0.5 04/22/2018 1526     Lab Results  Component Value Date   HGBA1C 5.5 04/22/2018    IMPRESSION AND PLAN:  1) Bilat knee osteoarthritis. Chronic pain med tx.  NSAIDs not an option b/c pt has only one kidney.  Pt really wants to avoid surgery. Recent bilat knee x-rays showed pretty stable osteoarthritic changes. Pt requested steroid injection in both knees today so I did this.   Procedure: Therapeutic knee injection.  The following procedure was done for [facility] knee  today.  The patient's clinical condition is marked by substantial pain and/or significant functional disability.  Other conservative therapy has not provided relief, is contraindicated, or not appropriate.  There is a reasonable likelihood that injection will significantly improve the patient's pain and/or functional disability. Cleaned skin with alcohol swab, used anterior approach with pt sitting and knees at 90 deg of flexion to enter knee joint, Injected 57ml of 80mg /ml depo medrol + 2 ml of 1% lidocaine w/out epi into joint space without resistance.  No immediate complications.  Patient tolerated procedure well.  Post-injection care discussed, including 20 min of icing 1-2 times  in the next 4-8 hours, frequent non weight-bearing ROM exercises over the next few days, and general pain medication management.  An After Visit Summary was printed and given to the patient.  FOLLOW UP: around 04/28/19 for f/u chronic pain syndrome.  Signed:  Crissie Sickles, MD           02/11/2019

## 2019-02-24 ENCOUNTER — Other Ambulatory Visit: Payer: Self-pay

## 2019-02-24 ENCOUNTER — Encounter: Payer: Self-pay | Admitting: Family Medicine

## 2019-02-24 ENCOUNTER — Ambulatory Visit: Payer: BC Managed Care – PPO | Admitting: Family Medicine

## 2019-02-24 ENCOUNTER — Other Ambulatory Visit (HOSPITAL_COMMUNITY)
Admission: RE | Admit: 2019-02-24 | Discharge: 2019-02-24 | Disposition: A | Discharge status: Home / Self Care | Payer: BC Managed Care – PPO | Source: Ambulatory Visit | Attending: Family Medicine | Admitting: Family Medicine

## 2019-02-24 VITALS — BP 142/65 | HR 86 | Temp 96.8°F | Resp 18 | Wt 188.8 lb

## 2019-02-24 DIAGNOSIS — R300 Dysuria: Secondary | ICD-10-CM

## 2019-02-24 DIAGNOSIS — R350 Frequency of micturition: Secondary | ICD-10-CM | POA: Diagnosis not present

## 2019-02-24 LAB — POCT URINALYSIS DIP (MANUAL ENTRY)
Bilirubin, UA: NEGATIVE
Glucose, UA: NEGATIVE mg/dL
Ketones, POC UA: NEGATIVE mg/dL
Leukocytes, UA: NEGATIVE
Nitrite, UA: NEGATIVE
Spec Grav, UA: 1.025 (ref 1.010–1.025)
Urobilinogen, UA: 0.2 E.U./dL
pH, UA: 6 (ref 5.0–8.0)

## 2019-02-24 MED ORDER — CEPHALEXIN 500 MG PO CAPS: 500.0000 mg | ORAL_CAPSULE | Freq: Two times a day (BID) | ORAL | 0 refills | Status: DC

## 2019-02-24 MED ORDER — FLUCONAZOLE 150 MG PO TABS: ORAL_TABLET | ORAL | 0 refills | Status: DC

## 2019-02-24 NOTE — Progress Notes (Signed)
Subjective:    Patient ID: [name], female    DOB: [birth year], 63 y.o.   MRN: [id number]   HPI Patient is in today for dysuria since Monday/ Tuesday   No fever ---  + flank pain earlier in the week but it is gone   Past Medical History:  Diagnosis Date  . Bilateral carpal tunnel syndrome    pt to get bilat carpal tunnel release as of 09/2018 ortho eval  . BPPV (benign paroxysmal positional vertigo)   . Chronic pain syndrome   . Chronic renal insufficiency, stage III (moderate) (HCC)    GFR 50's  . Fibromyalgia   . GERD (gastroesophageal reflux disease)   . Heart murmur   . Hemorrhoids   . History of bronchitis    childhood, wood stove and coal heat  . History of galactorrhea 02/05/2011  . Hyperlipidemia, mixed   . Hypertension   . IFG (impaired fasting glucose) 2016   HbA1c 5.7-5.8.  Marland Kitchen Knee pain, bilateral 02/05/2011  . Lumbar spondylosis   . Overweight(278.02)   . 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 Wyatt Mage sign of recurrent dz as of this 05/2016 f/u.  Released from surveillance as of 06/03/16; return to urol prn.  . Seasonal allergic rhinitis   . Solitary left kidney 2013   Aquired: right nephrectomy for renal cancer    Past Surgical History:  Procedure Laterality Date  . CARPAL TUNNEL RELEASE Right 09/2018  . CESAREAN SECTION  1983  . COLONOSCOPY  04/2013   Normal.  Repeat 2019 ([facility]  . ROBOT ASSISTED LAPAROSCOPIC NEPHRECTOMY  07/12/2012   Procedure: ROBOTIC ASSISTED LAPAROSCOPIC NEPHRECTOMY;  Surgeon: Alexis Frock, MD;  Location: [facility];  Service: Urology;  Laterality: Right;  . TONSILLECTOMY    . TUBAL LIGATION      Family History  Problem Relation Age of Onset  . Thyroid disease Mother   . Heart disease Mother        CHF, heart murmur, PAD  . Kidney disease Mother        RAS  . Other Mother 32       bowel  obstruction,surgery this year  . Colon cancer Father 48  . Cancer Sister        cervical, breast  . Thyroid disease Sister   . Heart disease Brother   . Thyroid disease Maternal Grandfather   . Cancer Maternal Grandfather   . Diabetes Paternal Grandmother   . Cancer Paternal Grandmother        breast  . Heart disease Paternal Grandmother   . Heart disease Paternal Grandfather   . Diabetes Paternal Grandfather   . Hypertension Paternal Grandfather   . Thyroid disease Sister   . Diabetes Sister   . Diabetes Sister   . Hypertension Sister   . Heart disease Maternal Grandmother   . Thyroid disease Maternal Grandmother   . Cancer Sister        kidney  . Asthma Daughter   . Allergies Daughter   . Lung disease Son        recurrent bronchitis  . Lung disease Daughter        recurrent bronchitis  . Cancer Paternal Aunt        breast cancer    Social History   Socioeconomic History  . Marital status: Married    Spouse name: Jerrye Beavers  . Number of  children: 3  . Years of education: Not on file  . Highest education level: Not on file  Occupational History  . Not on file  Social Needs  . Financial resource strain: Not on file  . Food insecurity    Worry: Not on file    Inability: Not on file  . Transportation needs    Medical: Not on file    Non-medical: Not on file  Tobacco Use  . Smoking status: Former Smoker    Packs/day: 2.00    Years: 5.00    Pack years: 10.00    Types: Cigarettes    Quit date: 07/14/1981    Years since quitting: 37.6  . Smokeless tobacco: Never Used  Substance and Sexual Activity  . Alcohol use: No  . Drug use: No  . Sexual activity: Yes  Lifestyle  . Physical activity    Days per week: Not on file    Minutes per session: Not on file  . Stress: Not on file  Relationships  . Social Herbalist on phone: Not on file    Gets together: Not on file    Attends religious service: Not on file    Active member of club or organization: Not  on file    Attends meetings of clubs or organizations: Not on file    Relationship status: Not on file  . Intimate partner violence    Fear of current or ex partner: Not on file    Emotionally abused: Not on file    Physically abused: Not on file    Forced sexual activity: Not on file  Other Topics Concern  . Not on file  Social History Narrative   Married, 3 children.   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   No tobacco, alcohol, or drugs.  No exercise.    Outpatient Medications Prior to Visit  Medication Sig Dispense Refill  . acetaminophen (TYLENOL) 500 MG tablet Take 1,000 mg by mouth every 8 (eight) hours as needed.    Marland Kitchen albuterol (VENTOLIN HFA) 108 (90 Base) MCG/ACT inhaler Inhale 2 puffs into the lungs every 6 (six) hours as needed for wheezing or shortness of breath. 1 Inhaler 0  . atorvastatin (LIPITOR) 80 MG tablet Take 1 tablet (80 mg total) by mouth daily. 90 tablet 0  . azelastine (ASTELIN) 0.1 % nasal spray     . cyclobenzaprine (FLEXERIL) 10 MG tablet TAKE 1 TABLET BY MOUTH THREE TIMES A DAY AS NEEDED 90 tablet 5  . diphenhydramine-acetaminophen (TYLENOL PM) 25-500 MG TABS Take 1 tablet by mouth at bedtime as needed.    . DULoxetine (CYMBALTA) 60 MG capsule TAKE 1 CAPSULE BY MOUTH EVERY DAY 90 capsule 0  . gabapentin (NEURONTIN) 300 MG capsule TAKE 2 CAPSULES BY MOUTH THREE TIMES A DAY 540 capsule 3  . losartan (COZAAR) 50 MG tablet TAKE 2 TABLETS (100MG  TOTAL DOSE) BY MOUTH DAILY 180 tablet 3  . Multiple Vitamin (MULTIVITAMIN WITH MINERALS) TABS Take 1 tablet by mouth daily.    Marland Kitchen OVER THE COUNTER MEDICATION Slimvance - for weight loss    . oxyCODONE (OXY IR/ROXICODONE) 5 MG immediate release tablet 1-2 tabs po q6h prn 70 tablet 0  . pantoprazole (PROTONIX) 40 MG tablet Take 1 tablet (40 mg total) by mouth daily. 90 tablet 1  . promethazine (PHENERGAN) 12.5 MG tablet 1-2 tabs po q6h prn nausea 30 tablet 0  . psyllium  (  METAMUCIL) 58.6 % powder Take 1 packet by mouth as needed.    . sennosides-docusate sodium (SENOKOT-S) 8.6-50 MG tablet Take 1 tablet by mouth 2 (two) times daily. While taking pain meds to prevent constipation 30 tablet 0  . Wheat Dextrin (BENEFIBER PO) Take by mouth.     No facility-administered medications prior to visit.     Allergies  Allergen Reactions  . Codeine Nausea Only  . Dilaudid [Hydromorphone Hcl] Nausea And Vomiting    Review of Systems  Constitutional: Negative for chills, fever and malaise/fatigue.  HENT: Negative for congestion and hearing loss.   Eyes: Negative for discharge.  Respiratory: Negative for cough, sputum production and shortness of breath.   Cardiovascular: Negative for chest pain, palpitations and leg swelling.  Gastrointestinal: Negative for abdominal pain, blood in stool, constipation, diarrhea, heartburn, nausea and vomiting.  Genitourinary: Positive for dysuria and frequency. Negative for hematuria and urgency.  Musculoskeletal: Negative for back pain, falls and myalgias.  Skin: Negative for rash.  Neurological: Negative for dizziness, sensory change, loss of consciousness, weakness and headaches.  Endo/Heme/Allergies: Negative for environmental allergies. Does not bruise/bleed easily.  Psychiatric/Behavioral: Negative for depression and suicidal ideas. The patient is not nervous/anxious and does not have insomnia.        Objective:    Physical Exam Vitals signs and nursing note reviewed.  Constitutional:      Appearance: She is well-developed.  HENT:     Head: Normocephalic and atraumatic.  Eyes:     Conjunctiva/sclera: Conjunctivae normal.  Neck:     Musculoskeletal: Normal range of motion and neck supple.     Thyroid: No thyromegaly.     Vascular: No carotid bruit or JVD.  Cardiovascular:     Rate and Rhythm: Normal rate and regular rhythm.     Heart sounds: Normal heart sounds. No murmur.  Pulmonary:     Effort: Pulmonary effort  is normal. No respiratory distress.     Breath sounds: Normal breath sounds. No wheezing or rales.  Chest:     Chest wall: No tenderness.  Abdominal:     General: There is no distension.     Tenderness: There is no abdominal tenderness. There is no right CVA tenderness, left CVA tenderness, guarding or rebound.  Neurological:     Mental Status: She is alert and oriented to person, place, and time.     BP (!) 142/65 (BP Location: Left Arm, Patient Position: Sitting, Cuff Size: Normal)   Pulse 86   Temp (!) 96.8 F (36 C) (Temporal)   Resp 18   Wt 188 lb 12.8 oz (85.6 kg)   SpO2 100%   BMI 31.42 kg/m  Wt Readings from Last 3 Encounters:  02/24/19 188 lb 12.8 oz (85.6 kg)  02/11/19 193 lb 6.4 oz (87.7 kg)  01/27/19 190 lb (86.2 kg)     Lab Results  Component Value Date   WBC 9.0 04/22/2018   HGB 12.5 04/22/2018   HCT 35.1 04/22/2018   PLT 275 04/22/2018   GLUCOSE 96 04/22/2018   CHOL 159 04/22/2018   TRIG 154 (H) 04/22/2018   HDL 65 04/22/2018   LDLDIRECT 123.0 07/09/2016   LDLCALC 70 04/22/2018   ALT 11 04/22/2018   AST 13 04/22/2018   NA 139 04/22/2018   K 4.1 04/22/2018   CL 103 04/22/2018   CREATININE 0.98 04/22/2018   BUN 15 04/22/2018   CO2 29 04/22/2018   TSH 0.90 04/22/2018   HGBA1C 5.5 04/22/2018  Lab Results  Component Value Date   TSH 0.90 04/22/2018   Lab Results  Component Value Date   WBC 9.0 04/22/2018   HGB 12.5 04/22/2018   HCT 35.1 04/22/2018   MCV 89.5 04/22/2018   PLT 275 04/22/2018   Lab Results  Component Value Date   NA 139 04/22/2018   K 4.1 04/22/2018   CO2 29 04/22/2018   GLUCOSE 96 04/22/2018   BUN 15 04/22/2018   CREATININE 0.98 04/22/2018   BILITOT 0.5 04/22/2018   ALKPHOS 101 07/09/2016   AST 13 04/22/2018   ALT 11 04/22/2018   PROT 6.9 04/22/2018   ALBUMIN 4.2 07/09/2016   CALCIUM 9.3 04/22/2018   GFR 60.80 01/07/2018   Lab Results  Component Value Date   CHOL 159 04/22/2018   Lab Results  Component  Value Date   HDL 65 04/22/2018   Lab Results  Component Value Date   LDLCALC 70 04/22/2018   Lab Results  Component Value Date   TRIG 154 (H) 04/22/2018   Lab Results  Component Value Date   CHOLHDL 2.4 04/22/2018   Lab Results  Component Value Date   HGBA1C 5.5 04/22/2018       Assessment & Plan:   Problem List Items Addressed This Visit    None    Visit Diagnoses    Urine frequency    -  Primary   Relevant Medications   cephALEXin (KEFLEX) 500 MG capsule   Other Relevant Orders   POCT urinalysis dipstick (Completed)   Urine Culture   Urine cytology ancillary only([facility])   Dysuria       Relevant Medications   cephALEXin (KEFLEX) 500 MG capsule   fluconazole (DIFLUCAN) 150 MG tablet      I am having Elenor Quinones start on cephALEXin and fluconazole. I am also having her maintain her multivitamin with minerals, sennosides-docusate sodium, diphenhydramine-acetaminophen, psyllium, Wheat Dextrin (BENEFIBER PO), promethazine, acetaminophen, azelastine, albuterol, pantoprazole, OVER THE COUNTER MEDICATION, gabapentin, DULoxetine, cyclobenzaprine, atorvastatin, oxyCODONE, and losartan.  Meds ordered this encounter  Medications  . cephALEXin (KEFLEX) 500 MG capsule    Sig: Take 1 capsule (500 mg total) by mouth 2 (two) times daily.    Dispense:  14 capsule    Refill:  0  . fluconazole (DIFLUCAN) 150 MG tablet    Sig: 1 po x1, may repeat in 3 days prn    Dispense:  2 tablet    Refill:  0     Ann Held, DO

## 2019-02-24 NOTE — Patient Instructions (Signed)

## 2019-02-26 LAB — URINE CULTURE
MICRO NUMBER:: 768572
SPECIMEN QUALITY:: ADEQUATE

## 2019-02-26 LAB — URINE CYTOLOGY ANCILLARY ONLY: Trichomonas: NEGATIVE

## 2019-03-01 ENCOUNTER — Telehealth: Payer: Self-pay | Admitting: Family Medicine

## 2019-03-01 ENCOUNTER — Ambulatory Visit: Payer: BC Managed Care – PPO | Admitting: Physician Assistant

## 2019-03-01 DIAGNOSIS — Z1159 Encounter for screening for other viral diseases: Secondary | ICD-10-CM | POA: Diagnosis not present

## 2019-03-01 LAB — URINE CYTOLOGY ANCILLARY ONLY
Bacterial vaginitis: NEGATIVE
Candida vaginitis: NEGATIVE

## 2019-03-01 NOTE — Telephone Encounter (Signed)
Pt was called and detailed message was left for pt to call and schedule a VV in order to receive a work note. Pt provided call back number to schedule.

## 2019-03-01 NOTE — Telephone Encounter (Signed)
Patient called in with body aches, headache, sore throat, general weakness. She is going to go to the [facility] for COVID test. Patient will need a work note. She will call back when she has the COVID test results. No appointments are available in our office this week.

## 2019-03-01 NOTE — Telephone Encounter (Signed)
MyChart message sent to patient to schedule.

## 2019-03-07 NOTE — Telephone Encounter (Signed)
Patient called to request doctor's note to return to work. Stated that she was seen on 8/13 for UTI sx, prescribed antibiotic, made her nausea and gave her diarrhea. Stated that with the symptoms she was having her employer stated that she had COVID-19 symptoms and needed to be tested. She went to get tested at [facility], she tested negative. But now she is requesting for a doctors note from when her symptoms began which was 02/23/19.  Please advise. Patient can be contacted at [phone]  Thank you

## 2019-03-07 NOTE — Telephone Encounter (Signed)
Patient left VM on front desk for call back to schedule an appointment. Called patient back and got her VM.

## 2019-03-08 NOTE — Telephone Encounter (Signed)
Will one of you pls do this letter for me and put my signature stamp on it? OK to say covid test was neg and put the dates of absence on it.  No o/v needed.-thx

## 2019-03-08 NOTE — Telephone Encounter (Signed)
Work note completed and will be emailed by patient request. Pt will call back if not received by the end of today.

## 2019-03-15 ENCOUNTER — Telehealth: Payer: Self-pay | Admitting: Family Medicine

## 2019-03-15 NOTE — Telephone Encounter (Signed)
Patient requesting an office appt with Dr. Anitra Lauth.  Prefers only to see her PCP.  Patient reporting arm/neck pain since Sunday.   Please advise. Patient can be contacted at [phone]  Thank you

## 2019-03-15 NOTE — Telephone Encounter (Signed)
Patient scheduled for 9/3, Thursday in office for back and arm pain

## 2019-03-17 ENCOUNTER — Ambulatory Visit: Payer: BC Managed Care – PPO | Admitting: Family Medicine

## 2019-03-17 DIAGNOSIS — Z0289 Encounter for other administrative examinations: Secondary | ICD-10-CM

## 2019-03-17 NOTE — Progress Notes (Deleted)
OFFICE VISIT  03/17/2019   CC: No chief complaint on file.  HPI:    Patient is a 63 y.o. Caucasian female who presents for arm and neck pain.  Past imaging: C spine plain films 09/21/17: CLINICAL DATA:  Initial evaluation for neck pain radiating into the right upper extremity.  EXAM: CERVICAL SPINE - COMPLETE 4+ VIEW  COMPARISON:  Prior radiograph from 01/01/2012.  FINDINGS: Vertebral bodies normally aligned with preservation of the normal cervical lordosis. No listhesis or subluxation. Vertebral body heights maintained. Normal C1-2 articulations are preserved in the dens is intact. No prevertebral edema.  Intervertebral disc space height maintained. Mild scattered uncovertebral disease without significant bony foraminal encroachment.  Soft tissues within normal limits.  IMPRESSION: 1. No radiographic evidence for acute or chronic abnormality within the cervical spine. 2. Mild scattered degenerative spondylolysis without significant bony foraminal encroachment.  Past Medical History:  Diagnosis Date  . Bilateral carpal tunnel syndrome    pt to get bilat carpal tunnel release as of 09/2018 ortho eval  . BPPV (benign paroxysmal positional vertigo)   . Chronic pain syndrome   . Chronic renal insufficiency, stage III (moderate) (HCC)    GFR 50's  . Fibromyalgia   . GERD (gastroesophageal reflux disease)   . Heart murmur   . Hemorrhoids   . History of bronchitis    childhood, wood stove and coal heat  . History of galactorrhea 02/05/2011  . Hyperlipidemia, mixed   . Hypertension   . IFG (impaired fasting glucose) 2016   HbA1c 5.7-5.8.  Marland Kitchen Knee pain, bilateral 02/05/2011  . Lumbar spondylosis   . Overweight(278.02)   . 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 Wyatt Mage sign of recurrent dz as of this 05/2016 f/u.  Released from surveillance as  of 06/03/16; return to urol prn.  . Seasonal allergic rhinitis   . Solitary left kidney 2013   Aquired: right nephrectomy for renal cancer    Past Surgical History:  Procedure Laterality Date  . CARPAL TUNNEL RELEASE Right 09/2018  . CESAREAN SECTION  1983  . COLONOSCOPY  04/2013   Normal.  Repeat 2019 ([facility]  . ROBOT ASSISTED LAPAROSCOPIC NEPHRECTOMY  07/12/2012   Procedure: ROBOTIC ASSISTED LAPAROSCOPIC NEPHRECTOMY;  Surgeon: Alexis Frock, MD;  Location: [facility];  Service: Urology;  Laterality: Right;  . TONSILLECTOMY    . TUBAL LIGATION      Outpatient Medications Prior to Visit  Medication Sig Dispense Refill  . acetaminophen (TYLENOL) 500 MG tablet Take 1,000 mg by mouth every 8 (eight) hours as needed.    Marland Kitchen albuterol (VENTOLIN HFA) 108 (90 Base) MCG/ACT inhaler Inhale 2 puffs into the lungs every 6 (six) hours as needed for wheezing or shortness of breath. 1 Inhaler 0  . atorvastatin (LIPITOR) 80 MG tablet Take 1 tablet (80 mg total) by mouth daily. 90 tablet 0  . azelastine (ASTELIN) 0.1 % nasal spray     . cephALEXin (KEFLEX) 500 MG capsule Take 1 capsule (500 mg total) by mouth 2 (two) times daily. 14 capsule 0  . cyclobenzaprine (FLEXERIL) 10 MG tablet TAKE 1 TABLET BY MOUTH THREE TIMES A DAY AS NEEDED 90 tablet 5  . diphenhydramine-acetaminophen (TYLENOL PM) 25-500 MG TABS Take 1 tablet by mouth at bedtime as needed.    . DULoxetine (CYMBALTA) 60 MG capsule TAKE 1 CAPSULE BY MOUTH EVERY DAY 90 capsule 0  . fluconazole (  DIFLUCAN) 150 MG tablet 1 po x1, may repeat in 3 days prn 2 tablet 0  . gabapentin (NEURONTIN) 300 MG capsule TAKE 2 CAPSULES BY MOUTH THREE TIMES A DAY 540 capsule 3  . losartan (COZAAR) 50 MG tablet TAKE 2 TABLETS (100MG  TOTAL DOSE) BY MOUTH DAILY 180 tablet 3  . Multiple Vitamin (MULTIVITAMIN WITH MINERALS) TABS Take 1 tablet by mouth daily.    Marland Kitchen OVER THE COUNTER MEDICATION Slimvance - for weight loss    . oxyCODONE (OXY IR/ROXICODONE) 5 MG  immediate release tablet 1-2 tabs po q6h prn 70 tablet 0  . pantoprazole (PROTONIX) 40 MG tablet Take 1 tablet (40 mg total) by mouth daily. 90 tablet 1  . promethazine (PHENERGAN) 12.5 MG tablet 1-2 tabs po q6h prn nausea 30 tablet 0  . psyllium (METAMUCIL) 58.6 % powder Take 1 packet by mouth as needed.    . sennosides-docusate sodium (SENOKOT-S) 8.6-50 MG tablet Take 1 tablet by mouth 2 (two) times daily. While taking pain meds to prevent constipation 30 tablet 0  . Wheat Dextrin (BENEFIBER PO) Take by mouth.     No facility-administered medications prior to visit.     Allergies  Allergen Reactions  . Codeine Nausea Only  . Dilaudid [Hydromorphone Hcl] Nausea And Vomiting    ROS As per HPI  PE: There were no vitals taken for this visit. ***  LABS:   Lab Results  Component Value Date   HGBA1C 5.5 04/22/2018      Chemistry      Component Value Date/Time   NA 139 04/22/2018 1526   K 4.1 04/22/2018 1526   CL 103 04/22/2018 1526   CO2 29 04/22/2018 1526   BUN 15 04/22/2018 1526   CREATININE 0.98 04/22/2018 1526      Component Value Date/Time   CALCIUM 9.3 04/22/2018 1526   ALKPHOS 101 07/09/2016 1020   AST 13 04/22/2018 1526   ALT 11 04/22/2018 1526   BILITOT 0.5 04/22/2018 1526     Lab Results  Component Value Date   WBC 9.0 04/22/2018   HGB 12.5 04/22/2018   HCT 35.1 04/22/2018   MCV 89.5 04/22/2018   PLT 275 04/22/2018    IMPRESSION AND PLAN:  No problem-specific Assessment & Plan notes found for this encounter.   An After Visit Summary was printed and given to the patient.  FOLLOW UP: No follow-ups on file.  Signed:  Crissie Sickles, MD           03/17/2019

## 2019-03-28 ENCOUNTER — Other Ambulatory Visit: Payer: Self-pay | Admitting: Family Medicine

## 2019-04-18 DIAGNOSIS — M9901 Segmental and somatic dysfunction of cervical region: Secondary | ICD-10-CM | POA: Diagnosis not present

## 2019-04-18 DIAGNOSIS — M62830 Muscle spasm of back: Secondary | ICD-10-CM | POA: Diagnosis not present

## 2019-04-18 DIAGNOSIS — M9902 Segmental and somatic dysfunction of thoracic region: Secondary | ICD-10-CM | POA: Diagnosis not present

## 2019-04-18 DIAGNOSIS — M9903 Segmental and somatic dysfunction of lumbar region: Secondary | ICD-10-CM | POA: Diagnosis not present

## 2019-04-19 DIAGNOSIS — M9901 Segmental and somatic dysfunction of cervical region: Secondary | ICD-10-CM | POA: Diagnosis not present

## 2019-04-19 DIAGNOSIS — M9903 Segmental and somatic dysfunction of lumbar region: Secondary | ICD-10-CM | POA: Diagnosis not present

## 2019-04-19 DIAGNOSIS — M9902 Segmental and somatic dysfunction of thoracic region: Secondary | ICD-10-CM | POA: Diagnosis not present

## 2019-04-19 DIAGNOSIS — M62830 Muscle spasm of back: Secondary | ICD-10-CM | POA: Diagnosis not present

## 2019-04-21 DIAGNOSIS — M62830 Muscle spasm of back: Secondary | ICD-10-CM | POA: Diagnosis not present

## 2019-04-21 DIAGNOSIS — M9901 Segmental and somatic dysfunction of cervical region: Secondary | ICD-10-CM | POA: Diagnosis not present

## 2019-04-21 DIAGNOSIS — M9902 Segmental and somatic dysfunction of thoracic region: Secondary | ICD-10-CM | POA: Diagnosis not present

## 2019-04-21 DIAGNOSIS — M9903 Segmental and somatic dysfunction of lumbar region: Secondary | ICD-10-CM | POA: Diagnosis not present

## 2019-04-25 ENCOUNTER — Other Ambulatory Visit: Payer: Self-pay | Admitting: Family Medicine

## 2019-04-25 DIAGNOSIS — M62830 Muscle spasm of back: Secondary | ICD-10-CM | POA: Diagnosis not present

## 2019-04-25 DIAGNOSIS — M9902 Segmental and somatic dysfunction of thoracic region: Secondary | ICD-10-CM | POA: Diagnosis not present

## 2019-04-25 DIAGNOSIS — M9903 Segmental and somatic dysfunction of lumbar region: Secondary | ICD-10-CM | POA: Diagnosis not present

## 2019-04-25 DIAGNOSIS — M9901 Segmental and somatic dysfunction of cervical region: Secondary | ICD-10-CM | POA: Diagnosis not present

## 2019-04-25 MED ORDER — OXYCODONE HCL 5 MG PO TABS: ORAL_TABLET | ORAL | 0 refills | Status: DC

## 2019-04-25 NOTE — Telephone Encounter (Signed)
Patient is requesting refill of oxycodone to make it 05/04/19 appointment. She has been seeing a chiropractor to try to cut back on how many pain pills she needs but she is returning back to work and will need the medication to make it at work.

## 2019-04-25 NOTE — Telephone Encounter (Signed)
Requesting: Oxycodone IR Contract: 04/22/18 UDS:04/22/18 Last Visit:02/11/19 Next Visit:05/04/19 Last Refill:01/27/19(70,0)  Please Advise. Medication pending

## 2019-04-28 ENCOUNTER — Ambulatory Visit: Payer: BC Managed Care – PPO | Admitting: Family Medicine

## 2019-05-04 ENCOUNTER — Other Ambulatory Visit: Payer: Self-pay

## 2019-05-04 ENCOUNTER — Encounter: Payer: Self-pay | Admitting: Family Medicine

## 2019-05-04 ENCOUNTER — Ambulatory Visit (INDEPENDENT_AMBULATORY_CARE_PROVIDER_SITE_OTHER): Payer: BC Managed Care – PPO | Admitting: Family Medicine

## 2019-05-04 VITALS — BP 136/83 | HR 84 | Temp 97.9°F | Resp 16 | Ht 64.5 in | Wt 185.0 lb

## 2019-05-04 DIAGNOSIS — Z0000 Encounter for general adult medical examination without abnormal findings: Secondary | ICD-10-CM | POA: Diagnosis not present

## 2019-05-04 DIAGNOSIS — L989 Disorder of the skin and subcutaneous tissue, unspecified: Secondary | ICD-10-CM | POA: Diagnosis not present

## 2019-05-04 DIAGNOSIS — I10 Essential (primary) hypertension: Secondary | ICD-10-CM | POA: Diagnosis not present

## 2019-05-04 DIAGNOSIS — E7800 Pure hypercholesterolemia, unspecified: Secondary | ICD-10-CM | POA: Diagnosis not present

## 2019-05-04 DIAGNOSIS — Z23 Encounter for immunization: Secondary | ICD-10-CM | POA: Diagnosis not present

## 2019-05-04 DIAGNOSIS — G894 Chronic pain syndrome: Secondary | ICD-10-CM

## 2019-05-04 DIAGNOSIS — N183 Chronic kidney disease, stage 3 unspecified: Secondary | ICD-10-CM | POA: Diagnosis not present

## 2019-05-04 LAB — LIPID PANEL
Cholesterol: 239 mg/dL — ABNORMAL HIGH (ref 0–200)
HDL: 68.9 mg/dL (ref >39.00)
NonHDL: 170.07
Total CHOL/HDL Ratio: 3
Triglycerides: 263 mg/dL — ABNORMAL HIGH (ref 0.0–149.0)
VLDL: 52.6 mg/dL — ABNORMAL HIGH (ref 0.0–40.0)

## 2019-05-04 LAB — COMPREHENSIVE METABOLIC PANEL
ALT: 14 U/L (ref 0–35)
AST: 14 U/L (ref 0–37)
Albumin: 4.3 g/dL (ref 3.5–5.2)
Alkaline Phosphatase: 100 U/L (ref 39–117)
BUN: 12 mg/dL (ref 6–23)
CO2: 26 mEq/L (ref 19–32)
Calcium: 9.2 mg/dL (ref 8.4–10.5)
Chloride: 105 mEq/L (ref 96–112)
Creatinine, Ser: 1.02 mg/dL (ref 0.40–1.20)
GFR: 55.03 mL/min — ABNORMAL LOW (ref >60.00)
Glucose, Bld: 95 mg/dL (ref 70–99)
Potassium: 4 mEq/L (ref 3.5–5.1)
Sodium: 139 mEq/L (ref 135–145)
Total Bilirubin: 0.3 mg/dL (ref 0.2–1.2)
Total Protein: 6.6 g/dL (ref 6.0–8.3)

## 2019-05-04 LAB — TSH: TSH: 1.21 u[IU]/mL (ref 0.35–4.50)

## 2019-05-04 LAB — LDL CHOLESTEROL, DIRECT: Direct LDL: 124 mg/dL

## 2019-05-04 LAB — CBC WITH DIFFERENTIAL/PLATELET
Basophils Absolute: 0 10*3/uL (ref 0.0–0.1)
Basophils Relative: 0.1 % (ref 0.0–3.0)
Eosinophils Absolute: 0.4 10*3/uL (ref 0.0–0.7)
Eosinophils Relative: 6.4 % — ABNORMAL HIGH (ref 0.0–5.0)
HCT: 38.1 % (ref 36.0–46.0)
Hemoglobin: 12.6 g/dL (ref 12.0–15.0)
Lymphocytes Relative: 43 % (ref 12.0–46.0)
Lymphs Abs: 2.9 10*3/uL (ref 0.7–4.0)
MCHC: 33.2 g/dL (ref 30.0–36.0)
MCV: 94.1 fl (ref 78.0–100.0)
Monocytes Absolute: 0.3 10*3/uL (ref 0.1–1.0)
Monocytes Relative: 4.9 % (ref 3.0–12.0)
Neutro Abs: 3.1 10*3/uL (ref 1.4–7.7)
Neutrophils Relative %: 45.6 % (ref 43.0–77.0)
Platelets: 272 10*3/uL (ref 150.0–400.0)
RBC: 4.04 Mil/uL (ref 3.87–5.11)
RDW: 13.5 % (ref 11.5–15.5)
WBC: 6.7 10*3/uL (ref 4.0–10.5)

## 2019-05-04 MED ORDER — ATORVASTATIN CALCIUM 80 MG PO TABS: 80.0000 mg | ORAL_TABLET | Freq: Every day | ORAL | 3 refills | Status: DC

## 2019-05-04 NOTE — Progress Notes (Signed)
Office Note 05/04/2019  CC:  Chief Complaint  Patient presents with  . Annual Exam    pt is fasting    HPI:  [name] is a 63 y.o. White female who is here for annual health maintenance exam and f/u chronic pain syndrome. GYN MD: Dr. Matthew Saras.  Indication for chronic opioid:chronic lumbar and bilat knee osteoarthritis + fibromyalgia pain that I treat with pain meds in order to maximize functioning and quality of life. Medication and dose:oxycodone 5mg  # pills per month: 70 Last UDS date:04/22/18 Opioid Treatment Agreement signed (Y/N):Y, 04/22/18 Opioid Treatment Agreement last reviewed with patient:today [facility] reviewed this encounter (include red flags):today, no red flags. Most recent fill of oxycodone was 04/25/19, rx'd by me, #70.  Knee pain is stable. Pulled her back and had to stay out of work for a week or so. PT started but had to quit b/c couldn't afford. I allowed early rf of her pain med recently.  She has been back at work since 04/25/19. She takes oxy q6h while working, usually 1 bid on days off.  Has red bump on left side of chin for 3 mo or so, no pain or itching or drainage.  Past Medical History:  Diagnosis Date  . Bilateral carpal tunnel syndrome    pt to get bilat carpal tunnel release as of 09/2018 ortho eval  . BPPV (benign paroxysmal positional vertigo)   . Chronic pain syndrome   . Chronic renal insufficiency, stage III (moderate)    GFR 50's  . Fibromyalgia   . GERD (gastroesophageal reflux disease)   . Heart murmur   . Hemorrhoids   . History of bronchitis    childhood, wood stove and coal heat  . History of galactorrhea 02/05/2011  . Hyperlipidemia, mixed   . Hypertension   . IFG (impaired fasting glucose) 2016   HbA1c 5.7-5.8.  Marland Kitchen Knee pain, bilateral 02/05/2011  . Lumbar spondylosis   . Overweight(278.02)   . 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 Wyatt Mage sign of recurrent dz as of this 05/2016 f/u.  Released from surveillance as of 06/03/16; return to urol prn.  . Seasonal allergic rhinitis   . Solitary left kidney 2013   Aquired: right nephrectomy for renal cancer    Past Surgical History:  Procedure Laterality Date  . CARPAL TUNNEL RELEASE Right 09/2018  . CESAREAN SECTION  1983  . COLONOSCOPY  04/2013   Normal.  Repeat 2019 ([facility]  . ROBOT ASSISTED LAPAROSCOPIC NEPHRECTOMY  07/12/2012   Procedure: ROBOTIC ASSISTED LAPAROSCOPIC NEPHRECTOMY;  Surgeon: Alexis Frock, MD;  Location: [facility];  Service: Urology;  Laterality: Right;  . TONSILLECTOMY    . TUBAL LIGATION      Family History  Problem Relation Age of Onset  . Thyroid disease Mother   . Heart disease Mother        CHF, heart murmur, PAD  . Kidney disease Mother        RAS  . Other Mother 19       bowel obstruction,surgery this year  . Colon cancer Father 28  . Cancer Sister        cervical, breast  . Thyroid disease Sister   . Heart disease Brother   . Thyroid disease Maternal Grandfather   . Cancer Maternal Grandfather   . Diabetes Paternal Grandmother   . Cancer Paternal Grandmother  breast  . Heart disease Paternal Grandmother   . Heart disease Paternal Grandfather   . Diabetes Paternal Grandfather   . Hypertension Paternal Grandfather   . Thyroid disease Sister   . Diabetes Sister   . Diabetes Sister   . Hypertension Sister   . Heart disease Maternal Grandmother   . Thyroid disease Maternal Grandmother   . Cancer Sister        kidney  . Asthma Daughter   . Allergies Daughter   . Lung disease Son        recurrent bronchitis  . Lung disease Daughter        recurrent bronchitis  . Cancer Paternal Aunt        breast cancer    Social History   Socioeconomic History  . Marital status: Married    Spouse name: Jerrye Beavers  . Number of children: 3  . Years of education: Not on file  .  Highest education level: Not on file  Occupational History  . Not on file  Social Needs  . Financial resource strain: Not on file  . Food insecurity    Worry: Not on file    Inability: Not on file  . Transportation needs    Medical: Not on file    Non-medical: Not on file  Tobacco Use  . Smoking status: Former Smoker    Packs/day: 2.00    Years: 5.00    Pack years: 10.00    Types: Cigarettes    Quit date: 07/14/1981    Years since quitting: 37.8  . Smokeless tobacco: Never Used  Substance and Sexual Activity  . Alcohol use: No  . Drug use: No  . Sexual activity: Yes  Lifestyle  . Physical activity    Days per week: Not on file    Minutes per session: Not on file  . Stress: Not on file  Relationships  . Social Herbalist on phone: Not on file    Gets together: Not on file    Attends religious service: Not on file    Active member of club or organization: Not on file    Attends meetings of clubs or organizations: Not on file    Relationship status: Not on file  . Intimate partner violence    Fear of current or ex partner: Not on file    Emotionally abused: Not on file    Physically abused: Not on file    Forced sexual activity: Not on file  Other Topics Concern  . Not on file  Social History Narrative   Married, 3 children.   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   No tobacco, alcohol, or drugs.  No exercise.    Outpatient Medications Prior to Visit  Medication Sig Dispense Refill  . acetaminophen (TYLENOL) 500 MG tablet Take 1,000 mg by mouth every 8 (eight) hours as needed.    Marland Kitchen albuterol (VENTOLIN HFA) 108 (90 Base) MCG/ACT inhaler Inhale 2 puffs into the lungs every 6 (six) hours as needed for wheezing or shortness of breath. 1 Inhaler 0  . atorvastatin (LIPITOR) 80 MG tablet Take 1 tablet (80 mg total) by mouth daily. 90 tablet 0  . azelastine (ASTELIN) 0.1 % nasal spray     . cyclobenzaprine  (FLEXERIL) 10 MG tablet TAKE 1 TABLET BY MOUTH THREE TIMES A DAY AS NEEDED 90 tablet 5  . diphenhydramine-acetaminophen (TYLENOL PM) 25-500 MG TABS Take  1 tablet by mouth at bedtime as needed.    . DULoxetine (CYMBALTA) 60 MG capsule TAKE 1 CAPSULE BY MOUTH EVERY DAY 90 capsule 0  . fluconazole (DIFLUCAN) 150 MG tablet 1 po x1, may repeat in 3 days prn 2 tablet 0  . gabapentin (NEURONTIN) 300 MG capsule TAKE 2 CAPSULES BY MOUTH THREE TIMES A DAY 540 capsule 3  . losartan (COZAAR) 50 MG tablet TAKE 2 TABLETS (100MG  TOTAL DOSE) BY MOUTH DAILY 180 tablet 3  . Multiple Vitamin (MULTIVITAMIN WITH MINERALS) TABS Take 1 tablet by mouth daily.    Marland Kitchen OVER THE COUNTER MEDICATION Slimvance - for weight loss    . oxyCODONE (OXY IR/ROXICODONE) 5 MG immediate release tablet 1-2 tabs po q6h prn 70 tablet 0  . pantoprazole (PROTONIX) 40 MG tablet Take 1 tablet (40 mg total) by mouth daily. 90 tablet 1  . promethazine (PHENERGAN) 12.5 MG tablet 1-2 tabs po q6h prn nausea 30 tablet 0  . psyllium (METAMUCIL) 58.6 % powder Take 1 packet by mouth as needed.    . sennosides-docusate sodium (SENOKOT-S) 8.6-50 MG tablet Take 1 tablet by mouth 2 (two) times daily. While taking pain meds to prevent constipation 30 tablet 0  . Wheat Dextrin (BENEFIBER PO) Take by mouth.    . cephALEXin (KEFLEX) 500 MG capsule Take 1 capsule (500 mg total) by mouth 2 (two) times daily. (Patient not taking: Reported on 05/04/2019) 14 capsule 0   No facility-administered medications prior to visit.     Allergies  Allergen Reactions  . Codeine Nausea Only  . Dilaudid [Hydromorphone Hcl] Nausea And Vomiting    ROS Review of Systems  Constitutional: Negative for appetite change, chills, fatigue and fever.  HENT: Negative for congestion, dental problem, ear pain and sore throat.   Eyes: Negative for discharge, redness and visual disturbance.  Respiratory: Negative for cough, chest tightness, shortness of breath and wheezing.    Cardiovascular: Negative for chest pain, palpitations and leg swelling.  Gastrointestinal: Negative for abdominal pain, blood in stool, diarrhea, nausea and vomiting.  Genitourinary: Negative for difficulty urinating, dysuria, flank pain, frequency, hematuria and urgency.  Musculoskeletal: Positive for arthralgias (chronic bilat knee pain) and back pain. Negative for joint swelling, myalgias and neck stiffness.  Skin: Negative for pallor and rash.       Red bump on chin x 41mo  Neurological: Negative for dizziness, speech difficulty, weakness and headaches.  Hematological: Negative for adenopathy. Does not bruise/bleed easily.  Psychiatric/Behavioral: Negative for confusion and sleep disturbance. The patient is not nervous/anxious.     PE; Blood pressure 136/83, pulse 84, temperature 97.9 F (36.6 C), temperature source Temporal, resp. rate 16, height 5' 4.5" (1.638 m), weight 185 lb (83.9 kg), SpO2 96 %. Body mass index is 31.26 kg/m. Exam chaperoned by Deveron Furlong, CMA.  Gen: Alert, well appearing.  Patient is oriented to person, place, time, and situation. AFFECT: pleasant, lucid thought and speech. ENT: Ears: EACs clear, normal epithelium.  TMs with good light reflex and landmarks bilaterally.  Eyes: no injection, icteris, swelling, or exudate.  EOMI, PERRLA. Nose: no drainage or turbinate edema/swelling.  No injection or focal lesion.  Mouth: lips without lesion/swelling.  Oral mucosa pink and moist.  Dentition intact and without obvious caries or gingival swelling.  Oropharynx without erythema, exudate, or swelling.  Neck: supple/nontender.  No LAD, mass, or TM.  Carotid pulses 2+ bilaterally, without bruits. CV: RRR, no m/r/g.   LUNGS: CTA bilat, nonlabored resps, good aeration in  all lung fields. ABD: soft, NT, ND, BS normal.  No hepatospenomegaly or mass.  No bruits. EXT: no clubbing, cyanosis, or edema.  Musculoskeletal: no joint swelling, erythema, warmth, or tenderness.   ROM of all joints intact. Skin - no rashes or color changes. L side of chin with 2 mm reddish papule that blanches with pressure.  Non-tender.  No pustule or vesicular component.  No crusting.  Pertinent labs:  Lab Results  Component Value Date   TSH 0.90 04/22/2018   Lab Results  Component Value Date   WBC 9.0 04/22/2018   HGB 12.5 04/22/2018   HCT 35.1 04/22/2018   MCV 89.5 04/22/2018   PLT 275 04/22/2018   Lab Results  Component Value Date   CREATININE 0.98 04/22/2018   BUN 15 04/22/2018   NA 139 04/22/2018   K 4.1 04/22/2018   CL 103 04/22/2018   CO2 29 04/22/2018   Lab Results  Component Value Date   ALT 11 04/22/2018   AST 13 04/22/2018   ALKPHOS 101 07/09/2016   BILITOT 0.5 04/22/2018   Lab Results  Component Value Date   CHOL 159 04/22/2018   Lab Results  Component Value Date   HDL 65 04/22/2018   Lab Results  Component Value Date   LDLCALC 70 04/22/2018   Lab Results  Component Value Date   TRIG 154 (H) 04/22/2018   Lab Results  Component Value Date   CHOLHDL 2.4 04/22/2018   Lab Results  Component Value Date   HGBA1C 5.5 04/22/2018    ASSESSMENT AND PLAN:   1) Health maintenance exam: Reviewed age and gender appropriate health maintenance issues (prudent diet, regular exercise, health risks of tobacco and excessive alcohol, use of seatbelts, fire alarms in home, use of sunscreen).  Also reviewed age and gender appropriate health screening as well as vaccine recommendations. Vaccines: Flu vaccine-->given today. Labs: fasting HP. Cervical ca screening: Dr. Erick Blinks scheduled this month. Breast ca screening: Dr. Erick Blinks scheduled this month. Colon ca screening: overdue for colonoscopy->most recent was 2014, she is on q5y plan for FH colon ca->pt aware and will call GI for this when can.  2) Chronic pain syndrome.  Some increased use of her oxycodone lately due to an acute low back strain while at work.  Improving gradually.  Can't  afford PT. She is back at work. Renewed CSC today. UDS today--should show opioids only.  3) Skin lesion of face: she desires removal or cryo, asks for derm referral->ordered today.  An After Visit Summary was printed and given to the patient.  FOLLOW UP:  Return in about 3 months (around 08/04/2019) for f/u chronic pain.  Signed:  Crissie Sickles, MD           05/04/2019

## 2019-05-04 NOTE — Addendum Note (Signed)
Addended by: Tammi Sou on: 05/04/2019 09:26 AM   Modules accepted: Orders

## 2019-05-04 NOTE — Patient Instructions (Signed)
Health Maintenance, Female Adopting a healthy lifestyle and getting preventive care are important in promoting health and wellness. Ask your health care provider about:  The right schedule for you to have regular tests and exams.  Things you can do on your own to prevent diseases and keep yourself healthy. What should I know about diet, weight, and exercise? Eat a healthy diet   Eat a diet that includes plenty of vegetables, fruits, low-fat dairy products, and lean protein.  Do not eat a lot of foods that are high in solid fats, added sugars, or sodium. Maintain a healthy weight Body mass index (BMI) is used to identify weight problems. It estimates body fat based on height and weight. Your health care provider can help determine your BMI and help you achieve or maintain a healthy weight. Get regular exercise Get regular exercise. This is one of the most important things you can do for your health. Most adults should:  Exercise for at least 150 minutes each week. The exercise should increase your heart rate and make you sweat (moderate-intensity exercise).  Do strengthening exercises at least twice a week. This is in addition to the moderate-intensity exercise.  Spend less time sitting. Even light physical activity can be beneficial. Watch cholesterol and blood lipids Have your blood tested for lipids and cholesterol at 61 years of age, then have this test every 5 years. Have your cholesterol levels checked more often if:  Your lipid or cholesterol levels are high.  You are older than 61 years of age.  You are at high risk for heart disease. What should I know about cancer screening? Depending on your health history and family history, you may need to have cancer screening at various ages. This may include screening for:  Breast cancer.  Cervical cancer.  Colorectal cancer.  Skin cancer.  Lung cancer. What should I know about heart disease, diabetes, and high blood  pressure? Blood pressure and heart disease  High blood pressure causes heart disease and increases the risk of stroke. This is more likely to develop in people who have high blood pressure readings, are of African descent, or are overweight.  Have your blood pressure checked: ? Every 3-5 years if you are 18-39 years of age. ? Every year if you are 40 years old or older. Diabetes Have regular diabetes screenings. This checks your fasting blood sugar level. Have the screening done:  Once every three years after age 40 if you are at a normal weight and have a low risk for diabetes.  More often and at a younger age if you are overweight or have a high risk for diabetes. What should I know about preventing infection? Hepatitis B If you have a higher risk for hepatitis B, you should be screened for this virus. Talk with your health care provider to find out if you are at risk for hepatitis B infection. Hepatitis C Testing is recommended for:  Everyone born from 1945 through 1965.  Anyone with known risk factors for hepatitis C. Sexually transmitted infections (STIs)  Get screened for STIs, including gonorrhea and chlamydia, if: ? You are sexually active and are younger than 61 years of age. ? You are older than 61 years of age and your health care provider tells you that you are at risk for this type of infection. ? Your sexual activity has changed since you were last screened, and you are at increased risk for chlamydia or gonorrhea. Ask your health care provider if   you are at risk.  Ask your health care provider about whether you are at high risk for HIV. Your health care provider may recommend a prescription medicine to help prevent HIV infection. If you choose to take medicine to prevent HIV, you should first get tested for HIV. You should then be tested every 3 months for as long as you are taking the medicine. Pregnancy  If you are about to stop having your period (premenopausal) and  you may become pregnant, seek counseling before you get pregnant.  Take 400 to 800 micrograms (mcg) of folic acid every day if you become pregnant.  Ask for birth control (contraception) if you want to prevent pregnancy. Osteoporosis and menopause Osteoporosis is a disease in which the bones lose minerals and strength with aging. This can result in bone fractures. If you are 65 years old or older, or if you are at risk for osteoporosis and fractures, ask your health care provider if you should:  Be screened for bone loss.  Take a calcium or vitamin D supplement to lower your risk of fractures.  Be given hormone replacement therapy (HRT) to treat symptoms of menopause. Follow these instructions at home: Lifestyle  Do not use any products that contain nicotine or tobacco, such as cigarettes, e-cigarettes, and chewing tobacco. If you need help quitting, ask your health care provider.  Do not use street drugs.  Do not share needles.  Ask your health care provider for help if you need support or information about quitting drugs. Alcohol use  Do not drink alcohol if: ? Your health care provider tells you not to drink. ? You are pregnant, may be pregnant, or are planning to become pregnant.  If you drink alcohol: ? Limit how much you use to 0-1 drink a day. ? Limit intake if you are breastfeeding.  Be aware of how much alcohol is in your drink. In the [place] one drink equals one 12 oz bottle of beer (355 mL), one 5 oz glass of wine (148 mL), or one 1 oz glass of hard liquor (44 mL). General instructions  Schedule regular health, dental, and eye exams.  Stay current with your vaccines.  Tell your health care provider if: ? You often feel depressed. ? You have ever been abused or do not feel safe at home. Summary  Adopting a healthy lifestyle and getting preventive care are important in promoting health and wellness.  Follow your health care provider's instructions about healthy  diet, exercising, and getting tested or screened for diseases.  Follow your health care provider's instructions on monitoring your cholesterol and blood pressure. This information is not intended to replace advice given to you by your health care provider. Make sure you discuss any questions you have with your health care provider. Document Released: 01/13/2011 Document Revised: 06/23/2018 Document Reviewed: 06/23/2018 Elsevier Patient Education  2020 [employer]  

## 2019-05-06 LAB — PAIN MGMT, PROFILE 8 W/CONF, U
6 Acetylmorphine: NEGATIVE ng/mL
Alcohol Metabolites: NEGATIVE ng/mL (ref <500)
Amphetamines: NEGATIVE ng/mL
Benzodiazepines: NEGATIVE ng/mL
Buprenorphine, Urine: NEGATIVE ng/mL
Cocaine Metabolite: NEGATIVE ng/mL
Codeine: NEGATIVE ng/mL
Creatinine: 182.2 mg/dL
Hydrocodone: NEGATIVE ng/mL
Hydromorphone: NEGATIVE ng/mL
MDMA: NEGATIVE ng/mL
Marijuana Metabolite: NEGATIVE ng/mL
Morphine: NEGATIVE ng/mL
Norhydrocodone: NEGATIVE ng/mL
Noroxycodone: 4109 ng/mL
Opiates: NEGATIVE ng/mL
Oxidant: NEGATIVE ug/mL
Oxycodone: 2996 ng/mL
Oxycodone: POSITIVE ng/mL
Oxymorphone: 1576 ng/mL
pH: 5.7 (ref 4.5–9.0)

## 2019-05-12 DIAGNOSIS — Z803 Family history of malignant neoplasm of breast: Secondary | ICD-10-CM | POA: Diagnosis not present

## 2019-05-12 DIAGNOSIS — Z808 Family history of malignant neoplasm of other organs or systems: Secondary | ICD-10-CM | POA: Diagnosis not present

## 2019-05-12 DIAGNOSIS — Z85528 Personal history of other malignant neoplasm of kidney: Secondary | ICD-10-CM | POA: Diagnosis not present

## 2019-05-12 DIAGNOSIS — Z6833 Body mass index (BMI) 33.0-33.9, adult: Secondary | ICD-10-CM | POA: Diagnosis not present

## 2019-05-12 DIAGNOSIS — Z1151 Encounter for screening for human papillomavirus (HPV): Secondary | ICD-10-CM | POA: Diagnosis not present

## 2019-05-12 DIAGNOSIS — Z1231 Encounter for screening mammogram for malignant neoplasm of breast: Secondary | ICD-10-CM | POA: Diagnosis not present

## 2019-05-12 DIAGNOSIS — Z01419 Encounter for gynecological examination (general) (routine) without abnormal findings: Secondary | ICD-10-CM | POA: Diagnosis not present

## 2019-05-12 DIAGNOSIS — Z800 Family history of malignant neoplasm of digestive organs: Secondary | ICD-10-CM | POA: Diagnosis not present

## 2019-05-12 LAB — CBC AND DIFFERENTIAL: Hemoglobin: 13.9 (ref 12.0–16.0)

## 2019-05-12 LAB — HM PAP SMEAR: HM Pap smear: NORMAL

## 2019-05-12 LAB — RESULTS CONSOLE HPV: CHL HPV: NEGATIVE

## 2019-05-17 ENCOUNTER — Encounter: Payer: Self-pay | Admitting: Family Medicine

## 2019-05-17 ENCOUNTER — Other Ambulatory Visit: Payer: Self-pay | Admitting: Family Medicine

## 2019-05-24 ENCOUNTER — Other Ambulatory Visit: Payer: Self-pay | Admitting: Family Medicine

## 2019-05-24 ENCOUNTER — Encounter: Payer: Self-pay | Admitting: Family Medicine

## 2019-05-24 DIAGNOSIS — Z20828 Contact with and (suspected) exposure to other viral communicable diseases: Secondary | ICD-10-CM | POA: Diagnosis not present

## 2019-05-26 ENCOUNTER — Other Ambulatory Visit: Payer: Self-pay

## 2019-05-26 MED ORDER — OXYCODONE HCL 5 MG PO TABS: ORAL_TABLET | ORAL | 0 refills | Status: DC

## 2019-05-26 NOTE — Telephone Encounter (Signed)
Requesting: oxycodone Contract: 05/04/19 UDS: 05/04/19 Last Visit: 05/04/19 Next Visit: 08/05/19 Last Refill: 04/25/19 (70,0)  Please Advise. Medication pending

## 2019-06-13 ENCOUNTER — Telehealth: Payer: Self-pay

## 2019-06-13 MED ORDER — LOSARTAN POTASSIUM 100 MG PO TABS: 100.0000 mg | ORAL_TABLET | Freq: Every day | ORAL | 3 refills | Status: DC

## 2019-06-13 NOTE — Telephone Encounter (Signed)
Patient advised and voiced understanding.  

## 2019-06-13 NOTE — Telephone Encounter (Signed)
OK, 100 mg losartan eRx'd

## 2019-06-13 NOTE — Telephone Encounter (Signed)
Pharm sent [place] a message pt is requesting 100mg  losartan b/c she is having to take 2 50mg  tabs? Please advise change, thank you.  RF request for Losartan 100mg  LOV:05/04/19 Next ov: 08/05/19 Last written: 02/04/19 (180,3)

## 2019-06-13 NOTE — Addendum Note (Signed)
Addended by: Tammi Sou on: 06/13/2019 04:42 PM   Modules accepted: Orders

## 2019-07-04 ENCOUNTER — Other Ambulatory Visit: Payer: Self-pay | Admitting: Family Medicine

## 2019-07-04 MED ORDER — OXYCODONE HCL 5 MG PO TABS: ORAL_TABLET | ORAL | 0 refills | Status: DC

## 2019-07-04 NOTE — Telephone Encounter (Signed)
Requesting: oxycodone Contract:05/04/19 UDS:05/04/19 Last Visit: 05/04/19 Next Visit:08/05/19 Last Refill: 05/26/19 (70,0)  Please Advise. Medication pending

## 2019-07-04 NOTE — Telephone Encounter (Signed)
Patient refill  oxyCODONE (OXY IR/ROXICODONE) 5 MG immediate release tablet   [id number] - [place], [place]

## 2019-07-25 DIAGNOSIS — Z809 Family history of malignant neoplasm, unspecified: Secondary | ICD-10-CM | POA: Diagnosis not present

## 2019-07-25 DIAGNOSIS — Z13820 Encounter for screening for osteoporosis: Secondary | ICD-10-CM | POA: Diagnosis not present

## 2019-08-05 ENCOUNTER — Encounter: Payer: Self-pay | Admitting: Family Medicine

## 2019-08-05 ENCOUNTER — Other Ambulatory Visit: Payer: Self-pay

## 2019-08-05 ENCOUNTER — Ambulatory Visit: Payer: BC Managed Care – PPO | Admitting: Family Medicine

## 2019-08-05 VITALS — BP 124/77 | HR 72 | Temp 98.0°F | Resp 16 | Ht 64.5 in | Wt 182.0 lb

## 2019-08-05 DIAGNOSIS — M170 Bilateral primary osteoarthritis of knee: Secondary | ICD-10-CM

## 2019-08-05 DIAGNOSIS — G894 Chronic pain syndrome: Secondary | ICD-10-CM | POA: Diagnosis not present

## 2019-08-05 DIAGNOSIS — M797 Fibromyalgia: Secondary | ICD-10-CM | POA: Diagnosis not present

## 2019-08-05 MED ORDER — OXYCODONE HCL 5 MG PO TABS: ORAL_TABLET | ORAL | 0 refills | Status: DC

## 2019-08-05 NOTE — Progress Notes (Signed)
OFFICE VISIT  08/05/2019   CC:  Chief Complaint  Patient presents with  . Follow-up    RCI, pt is not fasting   HPI:    [name] who presents for 3 mo f/u chronic pain syndrome.  Indication for chronic opioid:chronic lumbar and bilat knee osteoarthritis + fibromyalgia pain that I treat with pain meds in order to maximize functioning and quality of life. Medication and dose:oxycodone 5mg  # pills per month: 70 Last UDS date:05/04/19 (appropriate results). Opioid Treatment Agreement signed (Y/N):Y, 05/04/19 Opioid Treatment Agreement last reviewed with patient:today [facility] reviewed this encounter (include red flags):today, no red flags. PMP AWARE reviewed today: most recent rx for oxycodone 5mg  was filled 07/04/19, # 11, rx by me. No red flags.  Work going ok. Pain control going as per her usual. Not as much need for oxycodone on days she doesn't work, which has been occurring lately b/c of some brief closures at her work due to [employer] and economy issues. Locking and shooting pains still occur in L>R knee but seems random as to what motion causes it.  Past Medical History:  Diagnosis Date  . Bilateral carpal tunnel syndrome    pt to get bilat carpal tunnel release as of 09/2018 ortho eval  . BPPV (benign paroxysmal positional vertigo)   . Chronic pain syndrome   . Chronic renal insufficiency, stage III (moderate)    GFR 50's  . Fibromyalgia   . GERD (gastroesophageal reflux disease)   . Heart murmur   . Hemorrhoids   . History of bronchitis    childhood, wood stove and coal heat  . History of galactorrhea 02/05/2011  . Hyperlipidemia, mixed   . Hypertension   . IFG (impaired fasting glucose) 2016   HbA1c 5.7-5.8.  Marland Kitchen Knee pain, bilateral 02/05/2011  . Lumbar spondylosis   . Overweight(278.02)   . 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 Wyatt Mage sign of recurrent dz as of this 05/2016 f/u.  Released from surveillance as of 06/03/16; return to urol prn.  . Seasonal allergic rhinitis   . Solitary left kidney 2013   Aquired: right nephrectomy for renal cancer    Past Surgical History:  Procedure Laterality Date  . CARPAL TUNNEL RELEASE Right 09/2018  . CESAREAN SECTION  1983  . COLONOSCOPY  04/2013   Normal.  Repeat 2019 ([facility] of [facility]  . DEXA     GYN scheduled this at 05/12/19 f/u.  Marland Kitchen ROBOT ASSISTED LAPAROSCOPIC NEPHRECTOMY  07/12/2012   Procedure: ROBOTIC ASSISTED LAPAROSCOPIC NEPHRECTOMY;  Surgeon: Alexis Frock, MD;  Location: [facility];  Service: Urology;  Laterality: Right;  . TONSILLECTOMY    . TUBAL LIGATION      Outpatient Medications Prior to Visit  Medication Sig Dispense Refill  . acetaminophen (TYLENOL) 500 MG tablet Take 1,000 mg by mouth every 8 (eight) hours as needed.    Marland Kitchen atorvastatin (LIPITOR) 80 MG tablet Take 1 tablet (80 mg total) by mouth daily. 90 tablet 3  . cyclobenzaprine (FLEXERIL) 10 MG tablet TAKE 1 TABLET BY MOUTH THREE TIMES A DAY AS NEEDED 90 tablet 5  . diphenhydramine-acetaminophen (TYLENOL PM) 25-500 MG TABS Take 1 tablet by mouth at bedtime as needed.    . DULoxetine (CYMBALTA) 60 MG capsule TAKE 1 CAPSULE BY MOUTH EVERY DAY 90 capsule 0  . gabapentin (NEURONTIN) 300 MG capsule TAKE 2 CAPSULES BY  MOUTH THREE TIMES A DAY 540 capsule 3  . losartan (COZAAR) 100 MG tablet Take 1 tablet (100 mg total) by mouth daily. 90 tablet 3  . OVER THE COUNTER MEDICATION Slimvance - for weight loss    . pantoprazole (PROTONIX) 40 MG tablet TAKE 1 TABLET BY MOUTH EVERY DAY 90 tablet 1  . sennosides-docusate sodium (SENOKOT-S) 8.6-50 MG tablet Take 1 tablet by mouth 2 (two) times daily. While taking pain meds to prevent constipation 30 tablet 0  . Wheat Dextrin (BENEFIBER PO) Take by mouth.    . oxyCODONE (OXY IR/ROXICODONE) 5 MG immediate release tablet 1-2 tabs po q6h prn 70  tablet 0  . albuterol (VENTOLIN HFA) 108 (90 Base) MCG/ACT inhaler Inhale 2 puffs into the lungs every 6 (six) hours as needed for wheezing or shortness of breath. (Patient not taking: Reported on 08/05/2019) 1 Inhaler 0  . azelastine (ASTELIN) 0.1 % nasal spray     . Multiple Vitamin (MULTIVITAMIN WITH MINERALS) TABS Take 1 tablet by mouth daily.    . promethazine (PHENERGAN) 12.5 MG tablet 1-2 tabs po q6h prn nausea (Patient not taking: Reported on 08/05/2019) 30 tablet 0  . psyllium (METAMUCIL) 58.6 % powder Take 1 packet by mouth as needed.    . fluconazole (DIFLUCAN) 150 MG tablet 1 po x1, may repeat in 3 days prn (Patient not taking: Reported on 08/05/2019) 2 tablet 0   No facility-administered medications prior to visit.    Allergies  Allergen Reactions  . Codeine Nausea Only  . Dilaudid [Hydromorphone Hcl] Nausea And Vomiting    ROS As per HPI  PE: Blood pressure 124/77, pulse 72, temperature 98 F (36.7 C), temperature source Temporal, resp. rate 16, height 5' 4.5" (1.638 m), weight 182 lb (82.6 kg), SpO2 97 %. Gen: Alert, well appearing.  Patient is oriented to person, place, time, and situation. AFFECT: pleasant, lucid thought and speech. No further exam today.  LABS:    Chemistry      Component Value Date/Time   NA 139 05/04/2019 0915   K 4.0 05/04/2019 0915   CL 105 05/04/2019 0915   CO2 26 05/04/2019 0915   BUN 12 05/04/2019 0915   CREATININE 1.02 05/04/2019 0915   CREATININE 0.98 04/22/2018 1526      Component Value Date/Time   CALCIUM 9.2 05/04/2019 0915   ALKPHOS 100 05/04/2019 0915   AST 14 05/04/2019 0915   ALT 14 05/04/2019 0915   BILITOT 0.3 05/04/2019 0915     Lab Results  Component Value Date   WBC 6.7 05/04/2019   HGB 13.9 05/12/2019   HCT 38.1 05/04/2019   MCV 94.1 05/04/2019   PLT 272.0 05/04/2019   Lab Results  Component Value Date   HGBA1C 5.5 04/22/2018    IMPRESSION AND PLAN:  Chronic pain syndrome: chronic lumbar and bilat knee  osteoarthritis + fibromyalgia pain that I treat with pain meds in order to maximize functioning and quality of life. Stable.  The current medical regimen is effective;  continue present plan and medications. I did electronic rx's for oxycodone 5mg , 1-2 q6h prn today for this month, Feb, and Mar 2021.  Appropriate fill on/after date was noted on each rx.  An After Visit Summary was printed and given to the patient.  FOLLOW UP: Return in about 3 months (around 11/03/2019) for RCI/pain.  Signed:  Crissie Sickles, MD           08/05/2019

## 2019-08-11 ENCOUNTER — Other Ambulatory Visit: Payer: Self-pay

## 2019-08-11 ENCOUNTER — Ambulatory Visit (INDEPENDENT_AMBULATORY_CARE_PROVIDER_SITE_OTHER): Payer: BC Managed Care – PPO | Admitting: Family Medicine

## 2019-08-11 ENCOUNTER — Encounter: Payer: Self-pay | Admitting: Family Medicine

## 2019-08-11 VITALS — BP 137/84 | HR 98

## 2019-08-11 DIAGNOSIS — J019 Acute sinusitis, unspecified: Secondary | ICD-10-CM | POA: Diagnosis not present

## 2019-08-11 DIAGNOSIS — Z20828 Contact with and (suspected) exposure to other viral communicable diseases: Secondary | ICD-10-CM | POA: Diagnosis not present

## 2019-08-11 DIAGNOSIS — J309 Allergic rhinitis, unspecified: Secondary | ICD-10-CM

## 2019-08-11 MED ORDER — PREDNISONE 20 MG PO TABS: ORAL_TABLET | ORAL | 0 refills | Status: DC

## 2019-08-11 MED ORDER — AMOXICILLIN-POT CLAVULANATE 875-125 MG PO TABS: 1.0000 | ORAL_TABLET | Freq: Two times a day (BID) | ORAL | 0 refills | Status: DC

## 2019-08-11 NOTE — Progress Notes (Signed)
Virtual Visit via Video Note  I connected with pt on 08/11/19 at  4:00 PM EST by a video enabled telemedicine application and verified that I am speaking with the correct person using two identifiers.  Location patient: home Location provider:work or home office Persons participating in the virtual visit: patient, provider  I discussed the limitations of evaluation and management by telemedicine and the availability of in person appointments. The patient expressed understanding and agreed to proceed.  Telemedicine visit is a necessity given the COVID-19 restrictions in place at the current time.  HPI: 64 y/o WF being seen today for respiratory complaints. Has been having more HAs the last month. Then about a week ago started getting more sneezing, nasal congestion worse hs, pressure in face/ears, PND and some occ cough productive of clear phlegm.  Mild ST.  +pain in maxillary sinus region bilat, sore in anterior neck gland regions diffusely.  No palpable LN"s per pt. No rash, no n/v/d, no body aches.  Some fatigue. 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   ROS: See pertinent positives and negatives per HPI.  Past Medical History:  Diagnosis Date  . Bilateral carpal tunnel syndrome    pt to get bilat carpal tunnel release as of 09/2018 ortho eval  . BPPV (benign paroxysmal positional vertigo)   . Chronic pain syndrome   . Chronic renal insufficiency, stage III (moderate)    GFR 50's  . Fibromyalgia   . GERD (gastroesophageal reflux disease)   . Heart murmur   . Hemorrhoids   . History of bronchitis    childhood, wood stove and coal heat  . History of galactorrhea 02/05/2011  . Hyperlipidemia, mixed   . Hypertension   . IFG (impaired fasting glucose) 2016   HbA1c 5.7-5.8.  Marland Kitchen Knee pain, bilateral 02/05/2011  . Lumbar spondylosis   . Overweight(278.02)   . 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 Wyatt Mage sign of recurrent dz as of this 05/2016 f/u.  Released from surveillance as of 06/03/16; return to urol prn.  . Seasonal allergic rhinitis   . Solitary left kidney 2013   Aquired: right nephrectomy for renal cancer    Past Surgical History:  Procedure Laterality Date  . CARPAL TUNNEL RELEASE Right 09/2018  . CESAREAN SECTION  1983  . COLONOSCOPY  04/2013   Normal.  Repeat 2019 ([facility] of [facility]  . DEXA     GYN scheduled this at 05/12/19 f/u.  Marland Kitchen ROBOT ASSISTED LAPAROSCOPIC NEPHRECTOMY  07/12/2012   Procedure: ROBOTIC ASSISTED LAPAROSCOPIC NEPHRECTOMY;  Surgeon: Alexis Frock, MD;  Location: [facility];  Service: Urology;  Laterality: Right;  . TONSILLECTOMY    . TUBAL LIGATION      Family History  Problem Relation Age of Onset  . Thyroid disease Mother   . Heart disease Mother        CHF, heart murmur, PAD  . Kidney disease Mother        RAS  . Other Mother 79       bowel obstruction,surgery this year  . Colon cancer Father 27  . Cancer Sister        cervical, breast  . Thyroid disease Sister   . Heart disease Brother   . Thyroid disease Maternal Grandfather   . Cancer Maternal Grandfather   . Diabetes Paternal Grandmother   . Cancer Paternal Grandmother        breast  .  Heart disease Paternal Grandmother   . Heart disease Paternal Grandfather   . Diabetes Paternal Grandfather   . Hypertension Paternal Grandfather   . Thyroid disease Sister   . Diabetes Sister   . Diabetes Sister   . Hypertension Sister   . Heart disease Maternal Grandmother   . Thyroid disease Maternal Grandmother   . Cancer Sister        kidney  . Asthma Daughter   . Allergies Daughter   . Lung disease Son        recurrent bronchitis  . Lung disease Daughter        recurrent bronchitis  . Cancer Paternal Aunt        breast cancer     Current Outpatient Medications:  .  acetaminophen (TYLENOL) 500 MG tablet, Take 1,000 mg by mouth every 8 (eight) hours as needed.,  Disp: , Rfl:  .  atorvastatin (LIPITOR) 80 MG tablet, Take 1 tablet (80 mg total) by mouth daily., Disp: 90 tablet, Rfl: 3 .  azelastine (ASTELIN) 0.1 % nasal spray, , Disp: , Rfl:  .  cyclobenzaprine (FLEXERIL) 10 MG tablet, TAKE 1 TABLET BY MOUTH THREE TIMES A DAY AS NEEDED, Disp: 90 tablet, Rfl: 5 .  diphenhydramine-acetaminophen (TYLENOL PM) 25-500 MG TABS, Take 1 tablet by mouth at bedtime as needed., Disp: , Rfl:  .  DULoxetine (CYMBALTA) 60 MG capsule, TAKE 1 CAPSULE BY MOUTH EVERY DAY, Disp: 90 capsule, Rfl: 0 .  gabapentin (NEURONTIN) 300 MG capsule, TAKE 2 CAPSULES BY MOUTH THREE TIMES A DAY, Disp: 540 capsule, Rfl: 3 .  losartan (COZAAR) 100 MG tablet, Take 1 tablet (100 mg total) by mouth daily., Disp: 90 tablet, Rfl: 3 .  Multiple Vitamin (MULTIVITAMIN WITH MINERALS) TABS, Take 1 tablet by mouth daily., Disp: , Rfl:  .  OVER THE COUNTER MEDICATION, Slimvance - for weight loss, Disp: , Rfl:  .  oxyCODONE (OXY IR/ROXICODONE) 5 MG immediate release tablet, 1-2 tabs po q6h prn, Disp: 70 tablet, Rfl: 0 .  pantoprazole (PROTONIX) 40 MG tablet, TAKE 1 TABLET BY MOUTH EVERY DAY, Disp: 90 tablet, Rfl: 1 .  psyllium (METAMUCIL) 58.6 % powder, Take 1 packet by mouth as needed., Disp: , Rfl:  .  sennosides-docusate sodium (SENOKOT-S) 8.6-50 MG tablet, Take 1 tablet by mouth 2 (two) times daily. While taking pain meds to prevent constipation, Disp: 30 tablet, Rfl: 0 .  Wheat Dextrin (BENEFIBER PO), Take by mouth., Disp: , Rfl:  .  albuterol (VENTOLIN HFA) 108 (90 Base) MCG/ACT inhaler, Inhale 2 puffs into the lungs every 6 (six) hours as needed for wheezing or shortness of breath. (Patient not taking: Reported on 08/05/2019), Disp: 1 Inhaler, Rfl: 0 .  promethazine (PHENERGAN) 12.5 MG tablet, 1-2 tabs po q6h prn nausea (Patient not taking: Reported on 08/05/2019), Disp: 30 tablet, Rfl: 0  EXAM:  VITALS per patient if applicable: BP 0000000 (BP Location: Left Arm, Patient Position: Sitting, Cuff  Size: Normal)   Pulse 98    GENERAL: alert, oriented, appears tired but nontoxic/NAD.  HEENT: atraumatic, conjunttiva clear, no obvious abnormalities on inspection of external nose and ears  NECK: normal movements of the head and neck  LUNGS: on inspection no signs of respiratory distress, breathing rate appears normal, no obvious gross SOB, gasping or wheezing  CV: no obvious cyanosis  MS: moves all visible extremities without noticeable abnormality  PSYCH/NEURO: pleasant and cooperative, no obvious depression or anxiety, speech and thought processing grossly intact  LABS: none  today    Chemistry      Component Value Date/Time   NA 139 05/04/2019 0915   K 4.0 05/04/2019 0915   CL 105 05/04/2019 0915   CO2 26 05/04/2019 0915   BUN 12 05/04/2019 0915   CREATININE 1.02 05/04/2019 0915   CREATININE 0.98 04/22/2018 1526      Component Value Date/Time   CALCIUM 9.2 05/04/2019 0915   ALKPHOS 100 05/04/2019 0915   AST 14 05/04/2019 0915   ALT 14 05/04/2019 0915   BILITOT 0.3 05/04/2019 0915     Lab Results  Component Value Date   WBC 6.7 05/04/2019   HGB 13.9 05/12/2019   HCT 38.1 05/04/2019   MCV 94.1 05/04/2019   PLT 272.0 05/04/2019   Lab Results  Component Value Date   TSH 1.21 05/04/2019    ASSESSMENT AND PLAN:  Discussed the following assessment and plan:  1) Acute URI: likely a combination of allergic and infectious rhinosinusitis. She states that she has a covid test pending. She'll quarantine until neg result on covid, or as appropriate if + test. Augmentin 875mg  bid x 10d.  Prednisone 40 mg qd x 5d. Saline nasal spray recommended.   I discussed the assessment and treatment plan with the patient. The patient was provided an opportunity to ask questions and all were answered. The patient agreed with the plan and demonstrated an understanding of the instructions.   The patient was advised to call back or seek an in-person evaluation if the symptoms  worsen or if the condition fails to improve as anticipated.  F/u: prn  Signed:  Crissie Sickles, MD           08/11/2019

## 2019-08-11 NOTE — Progress Notes (Signed)
137 

## 2019-09-02 ENCOUNTER — Other Ambulatory Visit: Payer: Self-pay | Admitting: Family Medicine

## 2019-09-09 ENCOUNTER — Telehealth: Payer: Self-pay

## 2019-09-09 NOTE — Telephone Encounter (Signed)
Patient refill request  oxyCODONE (OXY IR/ROXICODONE) 5 MG immediate release tablet [id number]   [id number] - [place], [place]

## 2019-09-12 NOTE — Telephone Encounter (Signed)
During pt's last visit on 08/05/19, electronic RF's done for Jan, Feb, and March 2021. Pt aware to contact pharmacy regarding refill.

## 2019-10-30 IMAGING — DX RIGHT KNEE - COMPLETE 4+ VIEW
4 series · 4 of 4 positions shown · non-contrast
Comparison: 11/14/2013

CLINICAL DATA: 63 y.o. Female. Pt c/o ongoing bilateral knee pain.

EXAM:
RIGHT KNEE - COMPLETE 4+ VIEW

[knee ap]
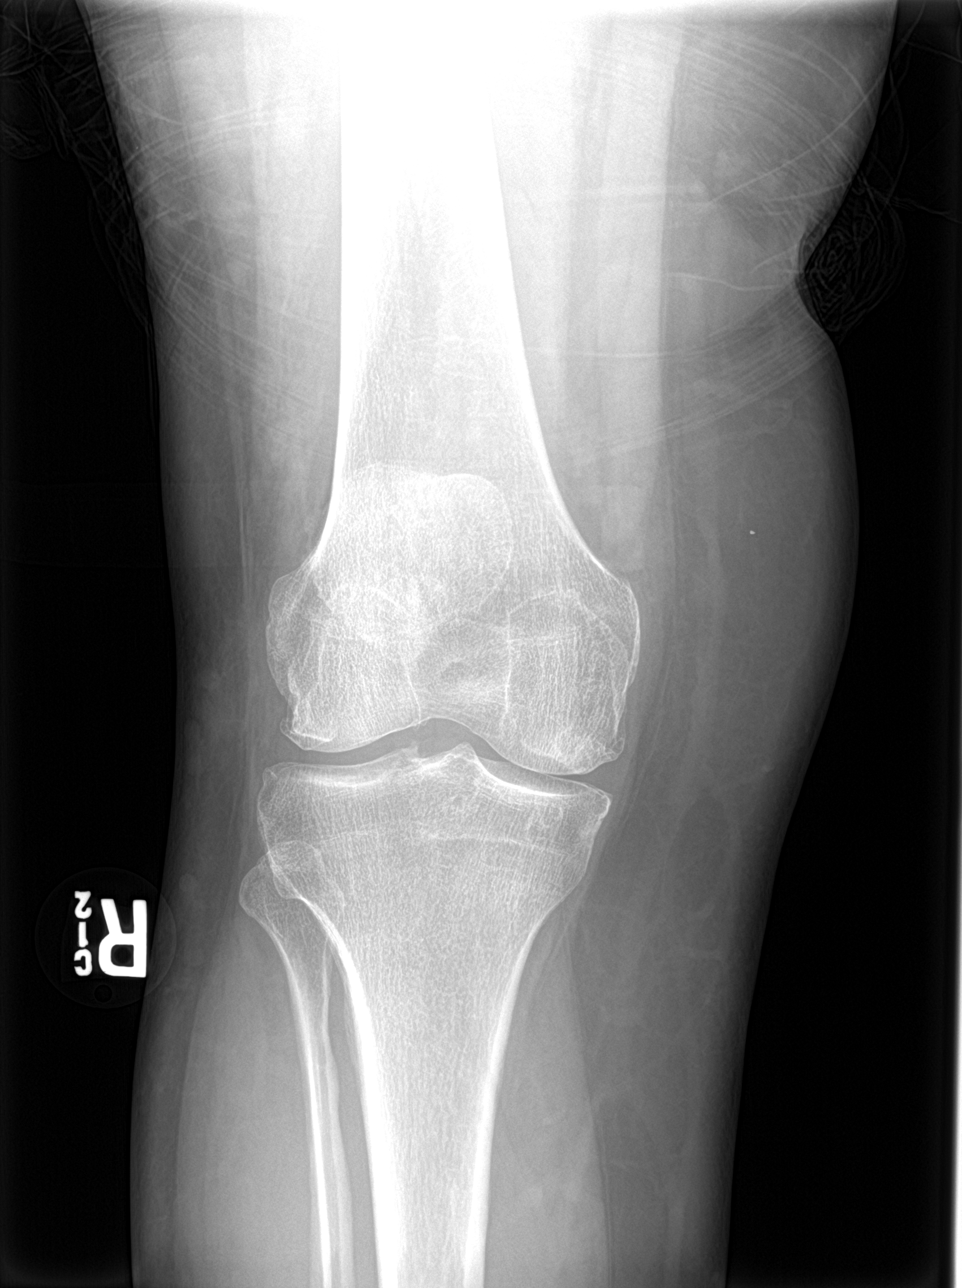

[knee lat]
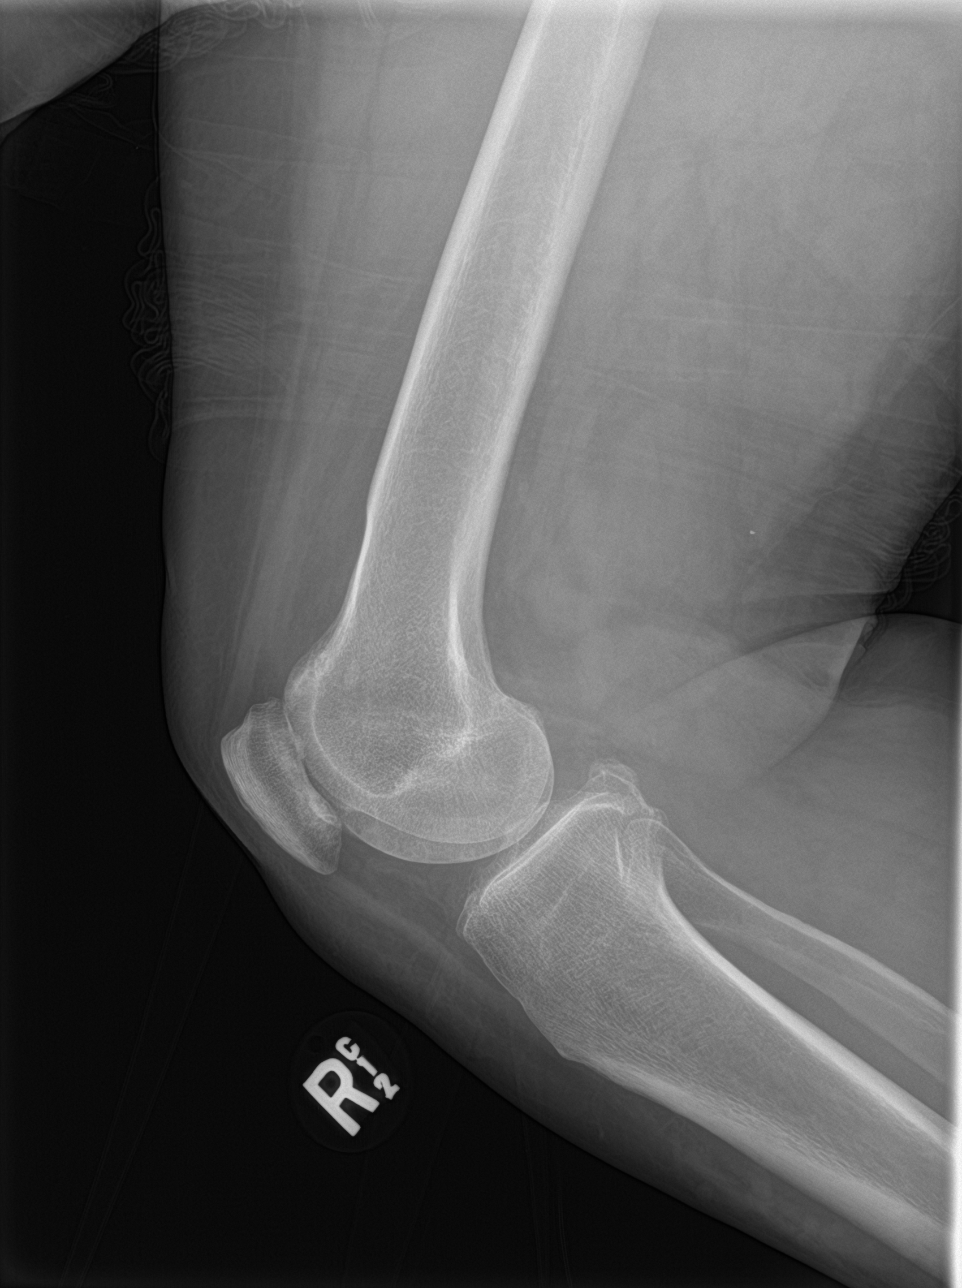

[knee obl (1 of 2)]
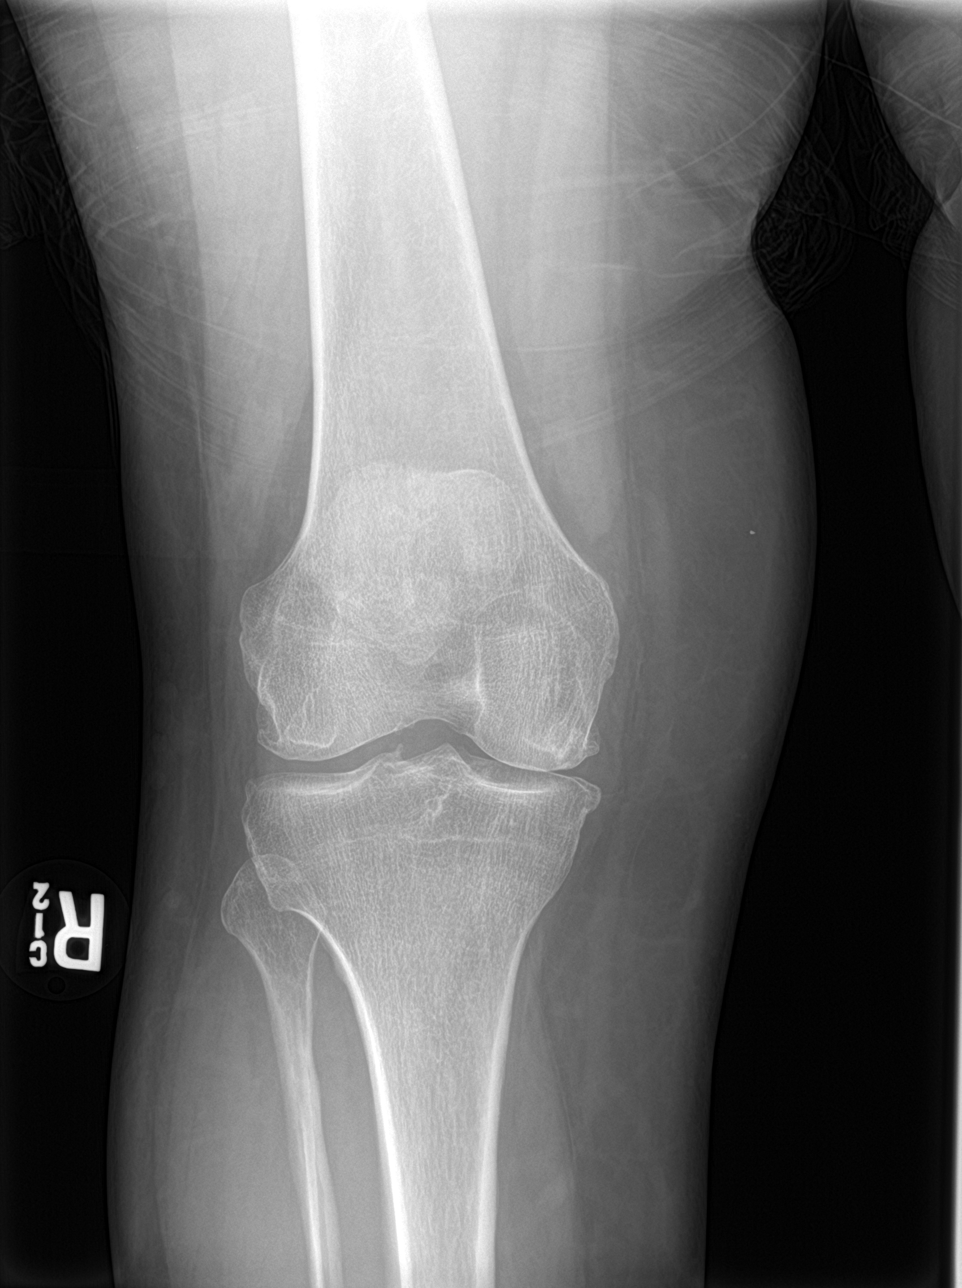

[knee obl (2 of 2)]
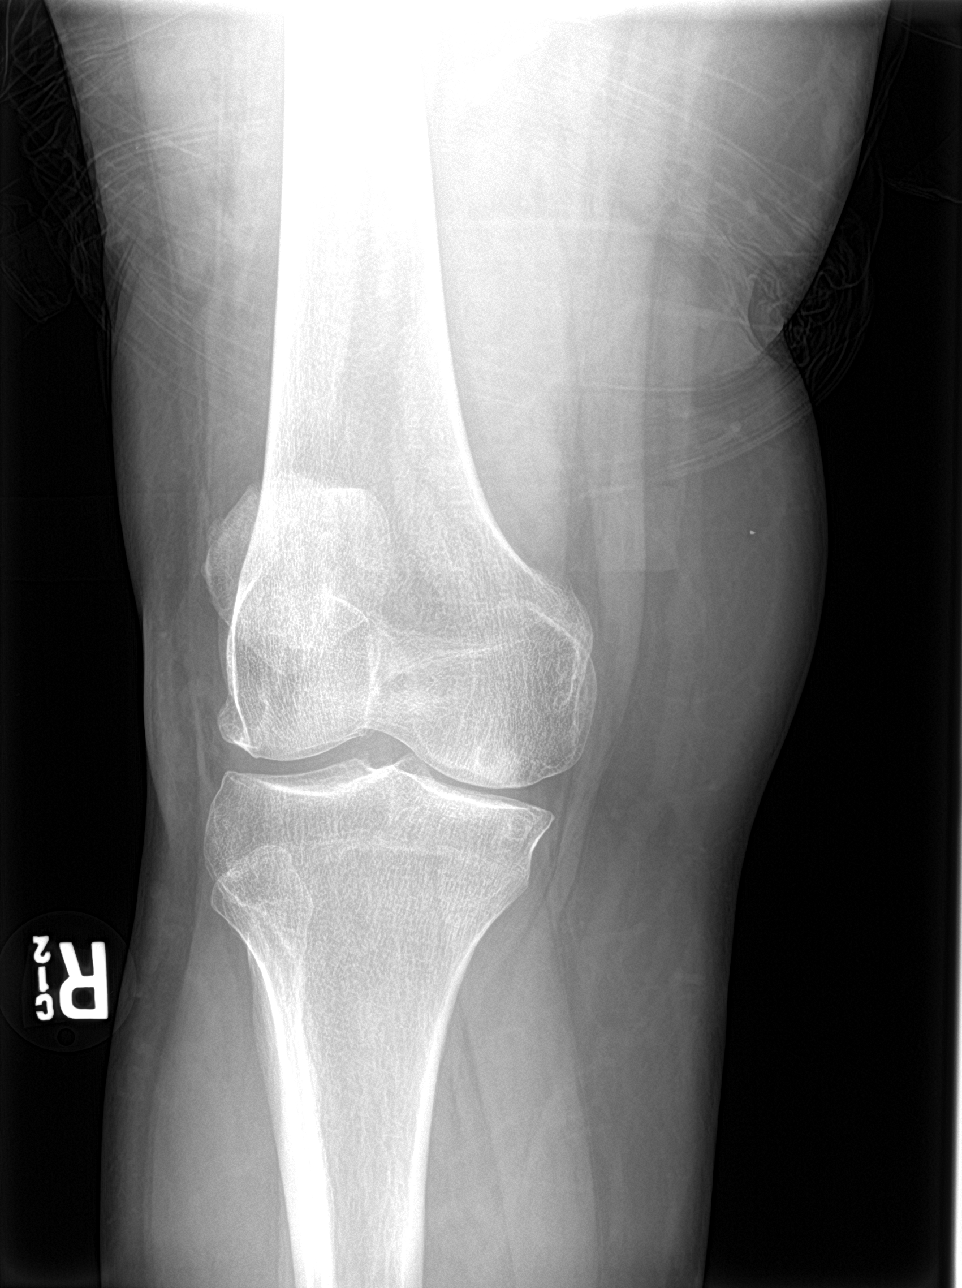

[4 of 4 positions shown; findings below may reference images not displayed]

FINDINGS: There are degenerative changes primarily involving the MEDIAL
compartment. No acute fracture or subluxation. No joint effusion.
IMPRESSION: No evidence for acute abnormality.

## 2019-10-30 IMAGING — DX LEFT KNEE - COMPLETE 4+ VIEW
4 series · 4 of 4 positions shown · non-contrast
Comparison: 11/14/2013

CLINICAL DATA: Bilateral knee pain, initial encounter

EXAM:
LEFT KNEE - COMPLETE 4+ VIEW

[knee ap]
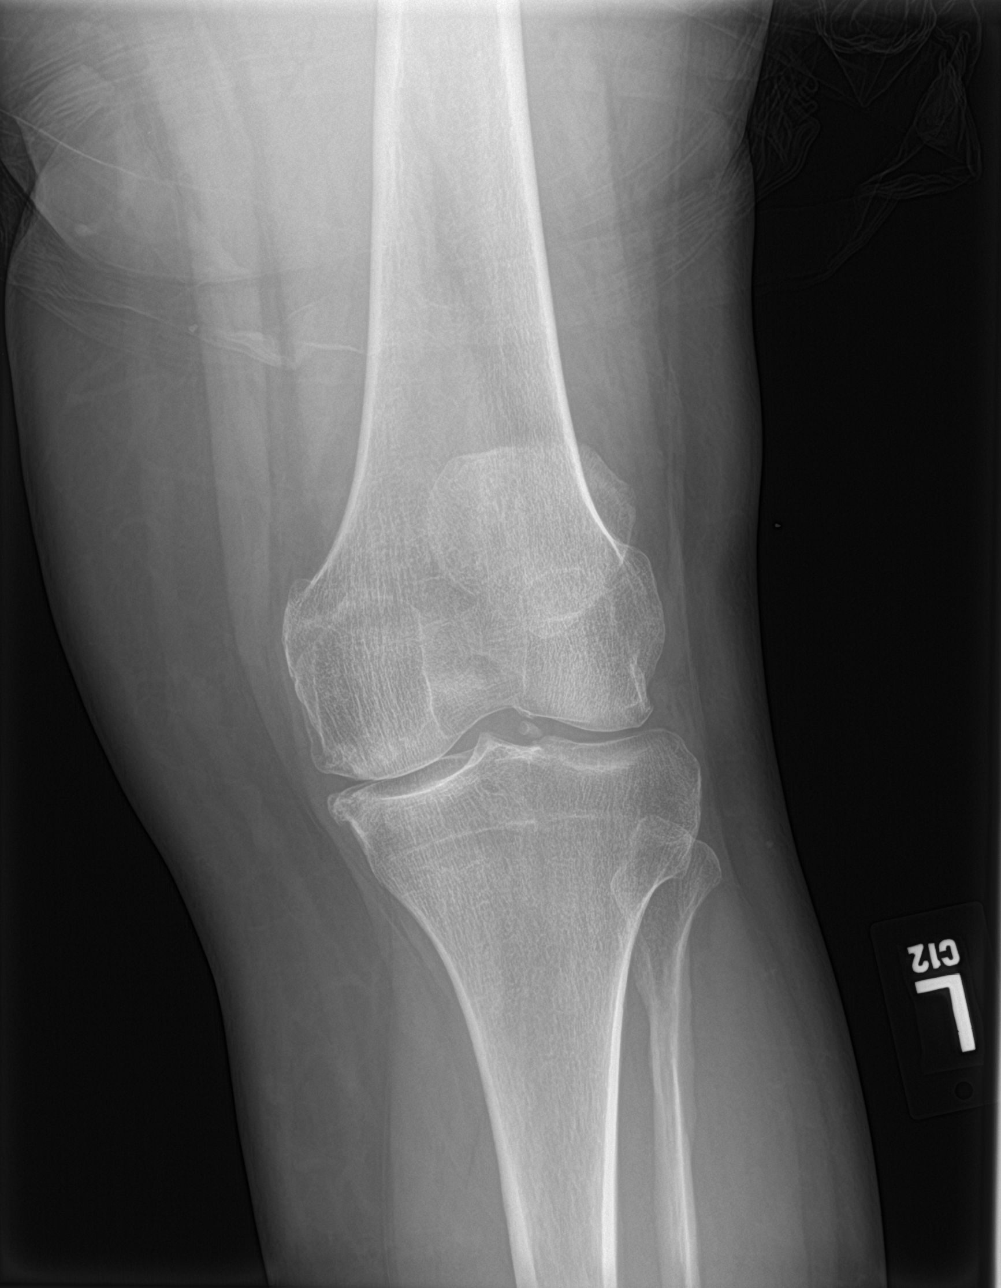

[knee lat]
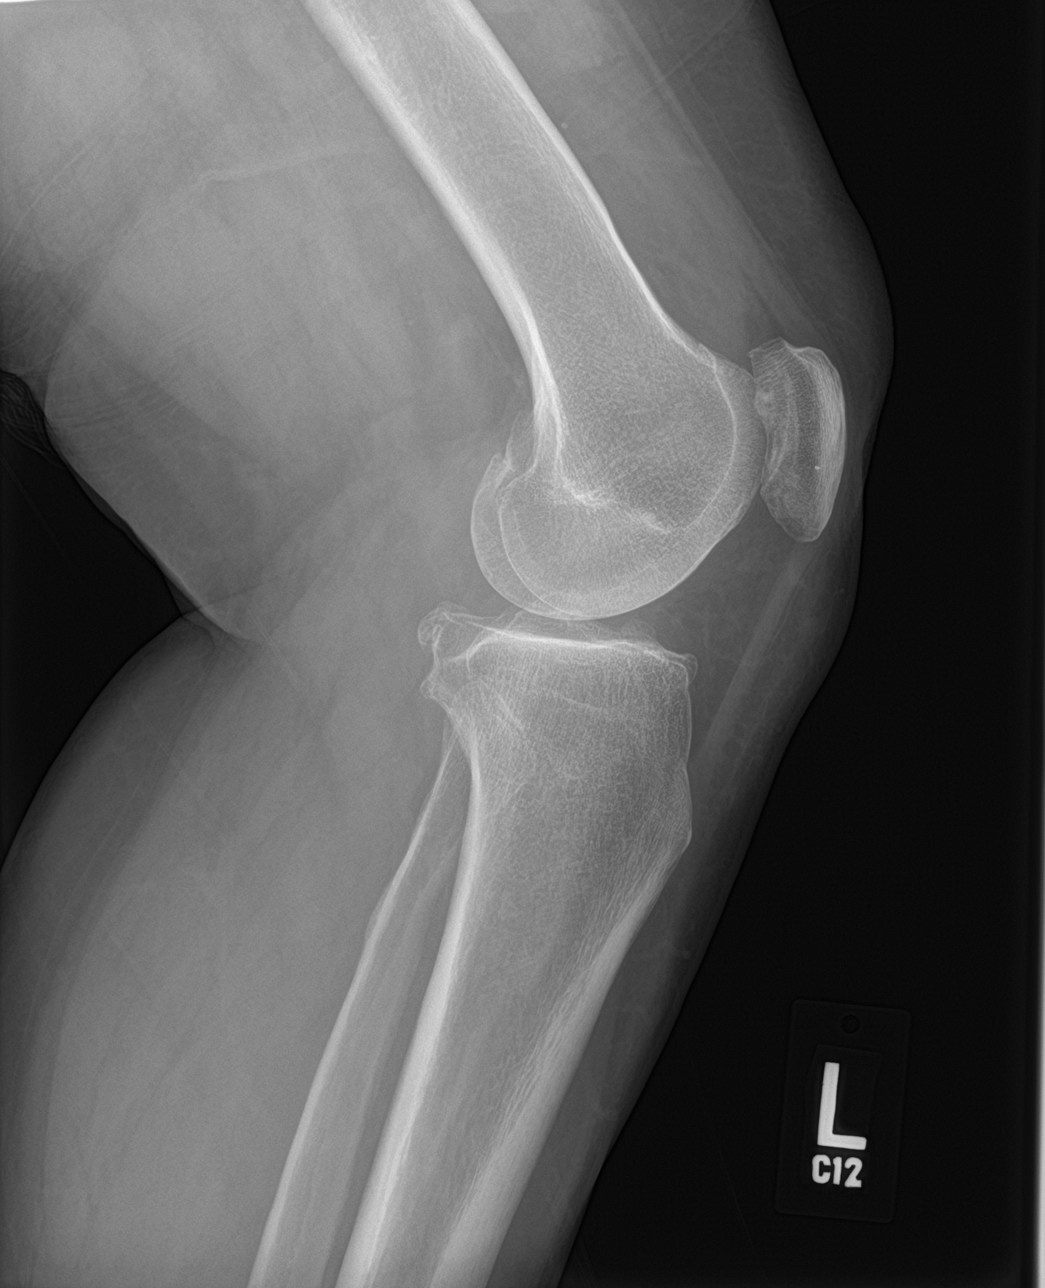

[knee obl (1 of 2)]
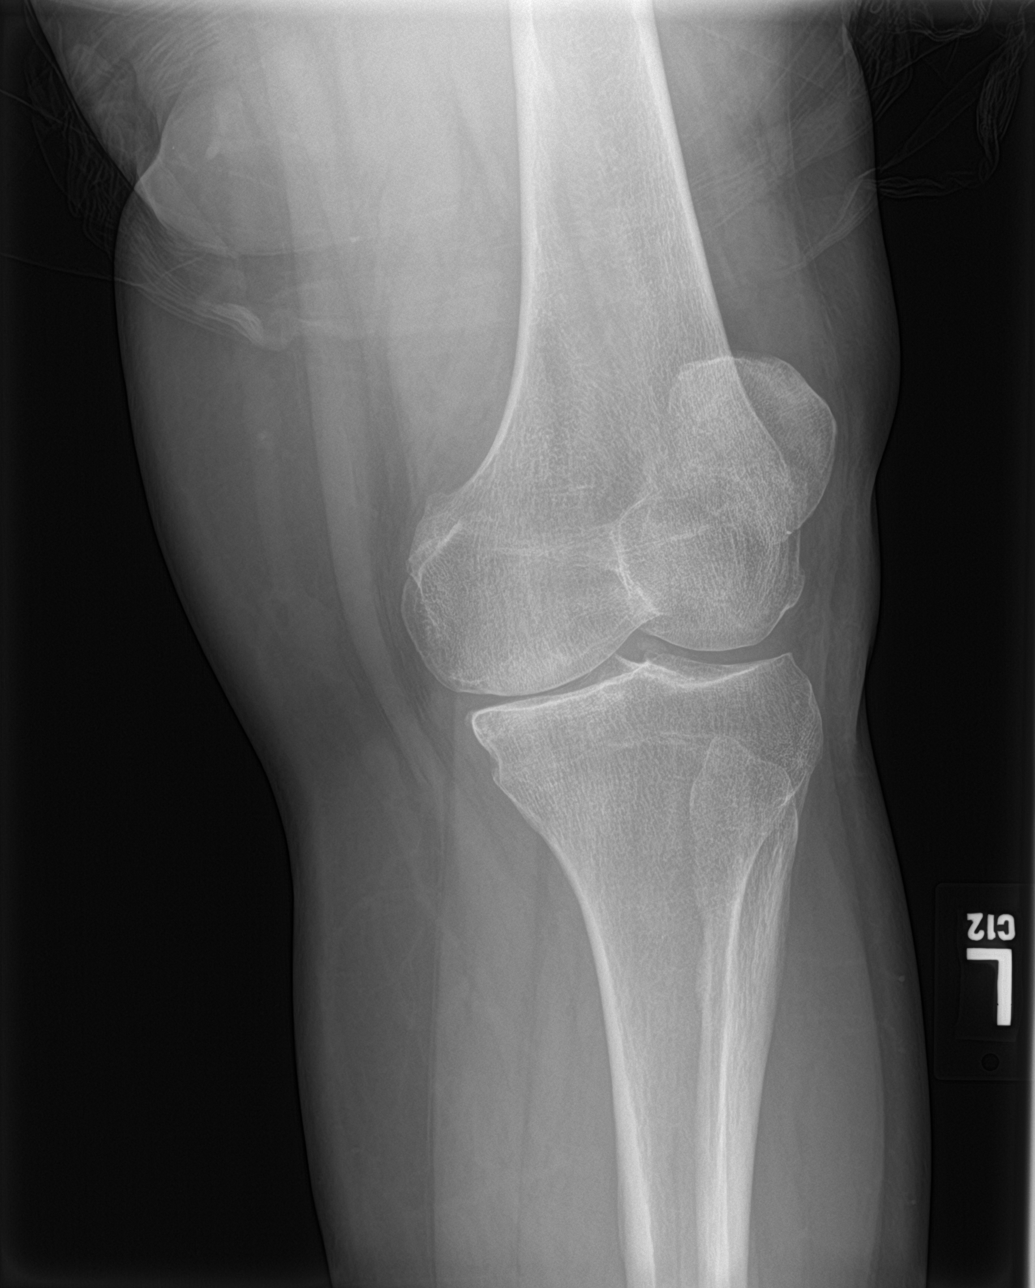

[knee obl (2 of 2)]
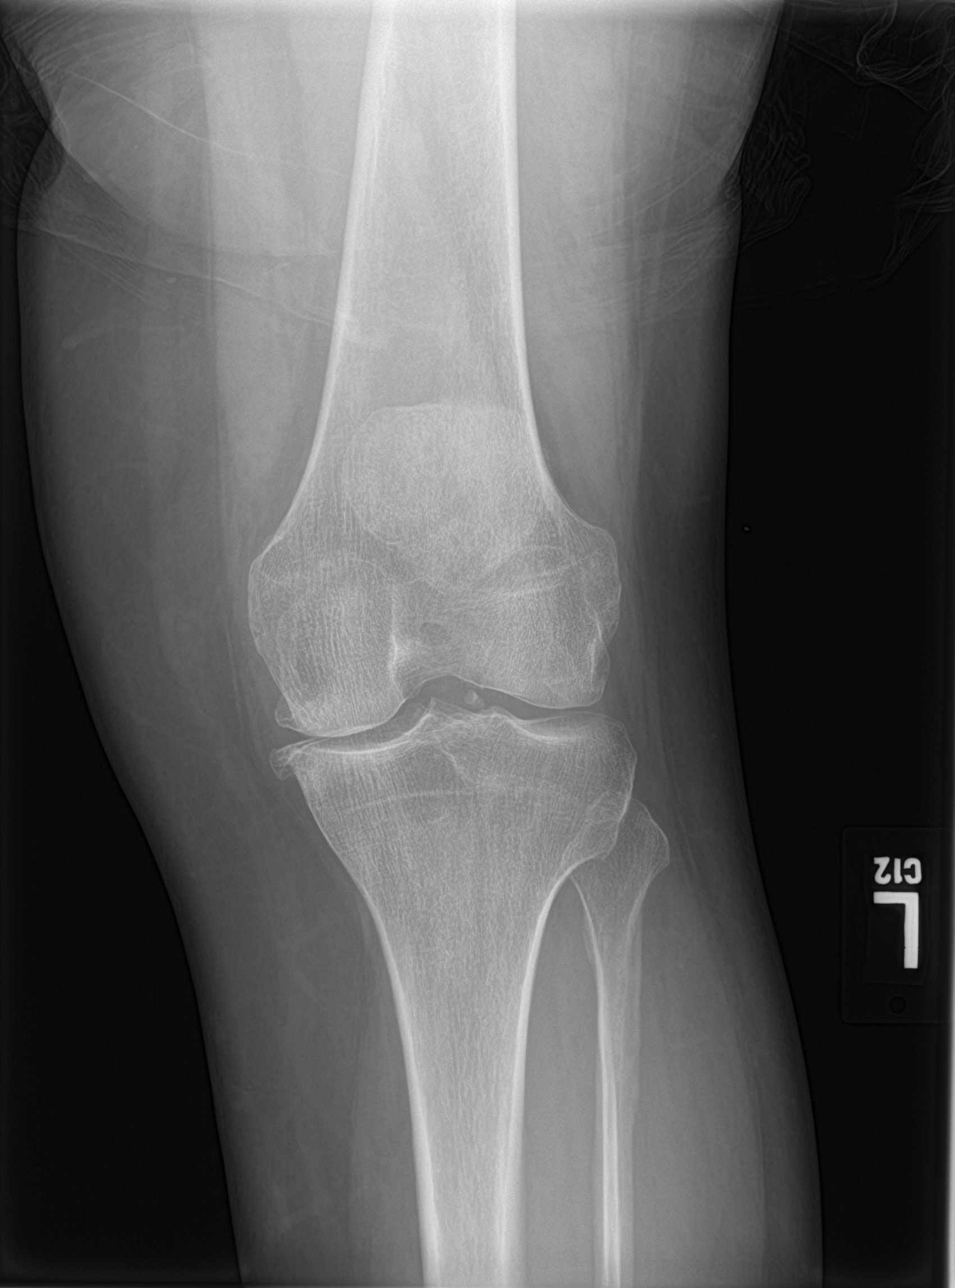

[4 of 4 positions shown; findings below may reference images not displayed]

FINDINGS: Tricompartmental degenerative changes are noted. Small joint
effusion is noted. No acute fracture or dislocation is noted.
IMPRESSION: Degenerative change with small joint effusion.

## 2019-11-07 ENCOUNTER — Encounter: Payer: Self-pay | Admitting: Family Medicine

## 2019-11-07 ENCOUNTER — Other Ambulatory Visit: Payer: Self-pay

## 2019-11-07 ENCOUNTER — Ambulatory Visit: Payer: Self-pay | Admitting: Family Medicine

## 2019-11-07 VITALS — BP 116/73 | HR 76 | Temp 97.8°F | Resp 16 | Ht 64.5 in | Wt 182.4 lb

## 2019-11-07 DIAGNOSIS — G894 Chronic pain syndrome: Secondary | ICD-10-CM

## 2019-11-07 DIAGNOSIS — M797 Fibromyalgia: Secondary | ICD-10-CM

## 2019-11-07 DIAGNOSIS — N183 Chronic kidney disease, stage 3 unspecified: Secondary | ICD-10-CM

## 2019-11-07 DIAGNOSIS — M170 Bilateral primary osteoarthritis of knee: Secondary | ICD-10-CM

## 2019-11-07 DIAGNOSIS — I10 Essential (primary) hypertension: Secondary | ICD-10-CM

## 2019-11-07 MED ORDER — OXYCODONE HCL 5 MG PO TABS: ORAL_TABLET | ORAL | 0 refills | Status: DC

## 2019-11-07 NOTE — Progress Notes (Signed)
OFFICE VISIT  11/07/2019   CC:  Chief Complaint  Patient presents with  . Follow-up    RCI, pt is not fasting   HPI:    [name] who presents for 3 mo f/u chronic pain syndrome, CRI III, HTN.  Indication for chronic opioid:chronic lumbar and bilat knee osteoarthritis + fibromyalgia pain that I treat with pain meds in order to maximize functioning and quality of life. Medication and dose:oxycodone 5mg  # pills per month:70 Last UDS date:05/04/19 (appropriate results). Opioid Treatment Agreement signed (Y/N):Y, 05/04/19 Opioid Treatment Agreement last reviewed with patient:today PMP AWARE reviewed today: most recent rx for oxycodone 5mg  was filled 10/10/19, # 43, rx by me. No red flags.  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   She is trying to take her oxycodone 1 tab at a time with tylenol 1000 twice a day. Some days she has to take more than 2.   Uses biofreeze and lidocaine on L lateral ant tibial muscular pain area. Rarely uses NSAID.  Tries to hydrate well.  Some constipation but does reasonably well with otc fiber supp and stool softener. No home bp monitoring lately.  Compliant with losartan 100 mg qd.  ROS: no fevers, no CP, no SOB, no wheezing, no cough, no dizziness, no HAs, no rashes, no melena/hematochezia.  No polyuria or polydipsia.  No myalgias or arthralgias.  No focal weakness, paresthesias, or tremors.  No acute vision or hearing abnormalities. No n/v/d/abd pain.  No palpitations.     Past Medical History:  Diagnosis Date  . Bilateral carpal tunnel syndrome    pt to get bilat carpal tunnel release as of 09/2018 ortho eval  . BPPV (benign paroxysmal positional vertigo)   . Chronic pain syndrome   . Chronic renal insufficiency, stage III (moderate)    GFR 50's  . Fibromyalgia   . GERD (gastroesophageal reflux disease)   . Heart murmur   . Hemorrhoids   . History of  bronchitis    childhood, wood stove and coal heat  . History of galactorrhea 02/05/2011  . Hyperlipidemia, mixed   . Hypertension   . IFG (impaired fasting glucose) 2016   HbA1c 5.7-5.8.  Marland Kitchen Knee pain, bilateral 02/05/2011  . Lumbar spondylosis   . Overweight(278.02)   . 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 Wyatt Mage sign of recurrent dz as of this 05/2016 f/u.  Released from surveillance as of 06/03/16; return to urol prn.  . Seasonal allergic rhinitis   . Solitary left kidney 2013   Aquired: right nephrectomy for renal cancer    Past Surgical History:  Procedure Laterality Date  . CARPAL TUNNEL RELEASE Right 09/2018  . CESAREAN SECTION  1983  . COLONOSCOPY  04/2013   Normal.  Repeat 2019 ([facility] of [facility]  . DEXA     GYN scheduled this at 05/12/19 f/u.  Marland Kitchen ROBOT ASSISTED LAPAROSCOPIC NEPHRECTOMY  07/12/2012   Procedure: ROBOTIC ASSISTED LAPAROSCOPIC NEPHRECTOMY;  Surgeon: Alexis Frock, MD;  Location: [facility];  Service: Urology;  Laterality: Right;  . TONSILLECTOMY    . TUBAL LIGATION      Outpatient Medications Prior to Visit  Medication Sig Dispense Refill  . acetaminophen (TYLENOL) 500 MG tablet Take 1,000 mg by mouth every 8 (eight) hours as needed.    Marland Kitchen atorvastatin (LIPITOR) 80 MG tablet  Take 1 tablet (80 mg total) by mouth daily. 90 tablet 3  . cyclobenzaprine (FLEXERIL) 10 MG tablet TAKE 1 TABLET BY MOUTH THREE TIMES A DAY AS NEEDED 90 tablet 5  . diphenhydramine-acetaminophen (TYLENOL PM) 25-500 MG TABS Take 1 tablet by mouth at bedtime as needed.    . DULoxetine (CYMBALTA) 60 MG capsule TAKE 1 CAPSULE BY MOUTH EVERY DAY 90 capsule 0  . gabapentin (NEURONTIN) 300 MG capsule TAKE 2 CAPSULES BY MOUTH THREE TIMES A DAY 540 capsule 3  . losartan (COZAAR) 100 MG tablet Take 1 tablet (100 mg total) by mouth daily. 90 tablet 3  . Multiple Vitamin (MULTIVITAMIN  WITH MINERALS) TABS Take 1 tablet by mouth daily.    Marland Kitchen OVER THE COUNTER MEDICATION Slimvance - for weight loss    . oxyCODONE (OXY IR/ROXICODONE) 5 MG immediate release tablet 1-2 tabs po q6h prn 70 tablet 0  . pantoprazole (PROTONIX) 40 MG tablet TAKE 1 TABLET BY MOUTH EVERY DAY 90 tablet 1  . sennosides-docusate sodium (SENOKOT-S) 8.6-50 MG tablet Take 1 tablet by mouth 2 (two) times daily. While taking pain meds to prevent constipation 30 tablet 0  . Wheat Dextrin (BENEFIBER PO) Take by mouth.    Marland Kitchen albuterol (VENTOLIN HFA) 108 (90 Base) MCG/ACT inhaler Inhale 2 puffs into the lungs every 6 (six) hours as needed for wheezing or shortness of breath. (Patient not taking: Reported on 08/05/2019) 1 Inhaler 0  . amoxicillin-clavulanate (AUGMENTIN) 875-125 MG tablet Take 1 tablet by mouth 2 (two) times daily. (Patient not taking: Reported on 11/07/2019) 20 tablet 0  . azelastine (ASTELIN) 0.1 % nasal spray     . promethazine (PHENERGAN) 12.5 MG tablet 1-2 tabs po q6h prn nausea (Patient not taking: Reported on 08/05/2019) 30 tablet 0  . psyllium (METAMUCIL) 58.6 % powder Take 1 packet by mouth as needed.    . predniSONE (DELTASONE) 20 MG tablet 2 tabs po qd x 5d (Patient not taking: Reported on 11/07/2019) 10 tablet 0   No facility-administered medications prior to visit.    Allergies  Allergen Reactions  . Codeine Nausea Only  . Dilaudid [Hydromorphone Hcl] Nausea And Vomiting    ROS As per HPI  PE: Blood pressure 116/73, pulse 76, temperature 97.8 F (36.6 C), temperature source Temporal, resp. rate 16, height 5' 4.5" (1.638 m), weight 182 lb 6.4 oz (82.7 kg), SpO2 95 %. Body mass index is 30.83 kg/m.  Gen: Alert, well appearing.  Patient is oriented to person, place, time, and situation. AFFECT: pleasant, lucid thought and speech. CV: RRR, no m/r/g.   LUNGS: CTA bilat, nonlabored resps, good aeration in all lung fields. Knees: generalized bony hypertrophy w/out STS or erythema or warmth.   No crepitus. No instability.  No LE swelling/edema/tenderness/focal nodularity.   LABS:  Lab Results  Component Value Date   TSH 1.21 05/04/2019   Lab Results  Component Value Date   WBC 6.7 05/04/2019   HGB 13.9 05/12/2019   HCT 38.1 05/04/2019   MCV 94.1 05/04/2019   PLT 272.0 05/04/2019   Lab Results  Component Value Date   CREATININE 1.02 05/04/2019   BUN 12 05/04/2019   NA 139 05/04/2019   K 4.0 05/04/2019   CL 105 05/04/2019   CO2 26 05/04/2019   Lab Results  Component Value Date   ALT 14 05/04/2019   AST 14 05/04/2019   ALKPHOS 100 05/04/2019   BILITOT 0.3 05/04/2019   Lab Results  Component Value Date  CHOL 239 (H) 05/04/2019   Lab Results  Component Value Date   HDL 68.90 05/04/2019   Lab Results  Component Value Date   LDLCALC 70 04/22/2018   Lab Results  Component Value Date   TRIG 263.0 (H) 05/04/2019   Lab Results  Component Value Date   CHOLHDL 3 05/04/2019   Lab Results  Component Value Date   HGBA1C 5.5 04/22/2018    IMPRESSION AND PLAN:  1) Chronic pain syndrome: bilat knee osteoarthritis with associated compensatory LL pains intermittently. Fibromyalgia syndrome. Doing reasonably well on oxycodone, prn flexeril, gabapentin, prn tylenol, RARE dose of NSAID. I did electronic rx's for oxycodone 5mg , 1-2 q6h prn, #60 today for each of the next 3 months.  Appropriate fill on/after date was noted on each rx.  2) HTN: The current medical regimen is effective;  continue present plan and medications. Lytes/cr today.  3) CRI III: avoiding NSAIDs. Emphasized good hydration habits. Lytes/cr today.   An After Visit Summary was printed and given to the patient.  FOLLOW UP: 3 mo f/u chronic pain  Signed:  Crissie Sickles, MD           11/07/2019

## 2019-11-08 LAB — BASIC METABOLIC PANEL
BUN: 16 mg/dL (ref 6–23)
CO2: 28 mEq/L (ref 19–32)
Calcium: 9.2 mg/dL (ref 8.4–10.5)
Chloride: 100 mEq/L (ref 96–112)
Creatinine, Ser: 1.01 mg/dL (ref 0.40–1.20)
GFR: 55.56 mL/min — ABNORMAL LOW (ref >60.00)
Glucose, Bld: 93 mg/dL (ref 70–99)
Potassium: 4.4 mEq/L (ref 3.5–5.1)
Sodium: 139 mEq/L (ref 135–145)

## 2019-12-13 ENCOUNTER — Telehealth: Payer: Self-pay

## 2019-12-13 MED ORDER — DULOXETINE HCL 60 MG PO CPEP: ORAL_CAPSULE | ORAL | 0 refills | Status: DC

## 2019-12-13 NOTE — Telephone Encounter (Signed)
Patient refill request  promethazine (PHENERGAN) 12.5 MG tablet [id number]    Please change preferred pharmacy to: [place], [address]   [facility]. Thank you.

## 2019-12-13 NOTE — Telephone Encounter (Signed)
Patient called in requesting a refill on her medication    DULoxetine (CYMBALTA) 60 MG capsule  [place], [address]

## 2019-12-13 NOTE — Telephone Encounter (Signed)
RX sent for a 90 day supply to [facility].

## 2019-12-14 ENCOUNTER — Other Ambulatory Visit: Payer: Self-pay

## 2019-12-14 MED ORDER — PROMETHAZINE HCL 12.5 MG PO TABS: ORAL_TABLET | ORAL | 0 refills | Status: DC

## 2019-12-14 NOTE — Telephone Encounter (Signed)
Rx sent in ,patient notified. 

## 2019-12-14 NOTE — Telephone Encounter (Signed)
Patient called back in letting Dr know she is very sick and is very bad pain needing this medication refilled ASAP.[name]     Please call and advise

## 2019-12-27 ENCOUNTER — Telehealth: Payer: Self-pay

## 2019-12-27 NOTE — Telephone Encounter (Signed)
Patient called in needing a refill request   oxyCODONE (OXY IR/ROXICODONE) 5 MG immediate release tablet  [place], [place] [address]

## 2019-12-27 NOTE — Telephone Encounter (Signed)
During pt's last visit on 11/07/19, 3 month supply sent in. Called patient to notify and voiced understanding.

## 2019-12-27 NOTE — Telephone Encounter (Signed)
Please call patient.  Patient states she does not get 3 month supply on her oxycodone. She states that is confusing and must be talking about another prescription.  She can be reached at [phone].

## 2019-12-27 NOTE — Telephone Encounter (Signed)
Called patient to clarify Rx for Oxycodone and she will be using [facility] [place] from now on. Verified p/u date for last Rx was 11/24/19 w/ qty of 60 tabs. Cancelled remaining Rx on hold with [facility].   Can Rx be sent to [place]?

## 2019-12-28 MED ORDER — OXYCODONE HCL 5 MG PO TABS: ORAL_TABLET | ORAL | 0 refills | Status: DC

## 2019-12-28 NOTE — Telephone Encounter (Signed)
OK, new rx for #60 ox[facility]codone sent to [facility]

## 2020-01-02 ENCOUNTER — Other Ambulatory Visit: Payer: Self-pay | Admitting: Family Medicine

## 2020-01-26 ENCOUNTER — Other Ambulatory Visit: Payer: Self-pay

## 2020-01-26 NOTE — Telephone Encounter (Signed)
Patient refill request  oxyCODONE (OXY IR/ROXICODONE) 5 MG immediate release tablet [[id number]]   losartan (COZAAR) 100 MG tablet [[id number]]   DULoxetine (CYMBALTA) 60 MG capsule [[id number]]   gabapentin (NEURONTIN) 300 MG capsule [[id number]]    [place], [place] - [address]  [facility]. She will not ever be using them again because of her insurance.

## 2020-01-27 MED ORDER — DULOXETINE HCL 60 MG PO CPEP: ORAL_CAPSULE | ORAL | 3 refills | Status: DC

## 2020-01-27 MED ORDER — OXYCODONE HCL 5 MG PO TABS: ORAL_TABLET | ORAL | 0 refills | Status: DC

## 2020-01-27 MED ORDER — LOSARTAN POTASSIUM 100 MG PO TABS: 100.0000 mg | ORAL_TABLET | Freq: Every day | ORAL | 3 refills | Status: DC

## 2020-01-27 MED ORDER — GABAPENTIN 300 MG PO CAPS: ORAL_CAPSULE | ORAL | 3 refills | Status: DC

## 2020-01-27 NOTE — Telephone Encounter (Signed)
OK, rx's sent  

## 2020-01-27 NOTE — Telephone Encounter (Signed)
Requesting: Oxycodone Contract:05/04/19 UDS:05/04/19 Last Visit:11/07/19 Next Visit:02/06/20 Last Refill:12/28/19(60,0)  RF request for Losartan LOV:11/07/19 Next ov: 02/06/20 Last written:06/13/19(90,3)  RF request for Duloxetine LOV:11/07/19 Next ov: 02/06/20 Last written:12/13/19(90,0)  RF request for Gabapentin LOV:11/07/19 Next ov: 02/06/20 Last written:09/24/18(540,3)   Medications pending, please advise. Pharmacy updated

## 2020-01-30 NOTE — Telephone Encounter (Signed)
Left detailed message advising refills sent. Okay per dpr

## 2020-02-06 ENCOUNTER — Ambulatory Visit: Payer: Self-pay | Admitting: Family Medicine

## 2020-02-20 ENCOUNTER — Ambulatory Visit: Payer: Self-pay | Admitting: Family Medicine

## 2020-02-20 NOTE — Progress Notes (Deleted)
OFFICE VISIT  02/20/2020   CC: No chief complaint on file.    HPI:    [name] who presents for 3 mo f/u chronic pain syndrome, CRI III, HTN. A/P as of last visit: "1) Chronic pain syndrome: bilat knee osteoarthritis with associated compensatory LL pains intermittently. Fibromyalgia syndrome. Doing reasonably well on oxycodone, prn flexeril, gabapentin, prn tylenol, RARE dose of NSAID. I did electronic rx's for oxycodone 5mg , 1-2 q6h prn, #60 today for each of the next 3 months.  Appropriate fill on/after date was noted on each rx.  2) HTN: The current medical regimen is effective;  continue present plan and medications. Lytes/cr today.  3) CRI III: avoiding NSAIDs. Emphasized good hydration habits. Lytes/cr today."  INTERIM HX:   .Indication for chronic opioid:chronic lumbar and bilat knee osteoarthritis + fibromyalgia pain that I treat with pain meds in order to maximize functioning and quality of life. Medication and dose:oxycodone 5mg  # pills per month:70 Last UDS date:05/04/19 (appropriate results). Opioid Treatment Agreement signed (Y/N):Y,05/04/19. PMP AWARE reviewed today: most recent rx for *** was filled ***, # ***, rx by ***. No red flags.  HTN:  CRI:  Past Medical History:  Diagnosis Date   Bilateral carpal tunnel syndrome    pt to get bilat carpal tunnel release as of 09/2018 ortho eval   BPPV (benign paroxysmal positional vertigo)    Chronic pain syndrome    Chronic renal insufficiency, stage III (moderate)    GFR 50's   Fibromyalgia    GERD (gastroesophageal reflux disease)    Heart murmur    Hemorrhoids    History of bronchitis    childhood, wood stove and coal heat   History of galactorrhea 02/05/2011   Hyperlipidemia, mixed    Hypertension    IFG (impaired fasting glucose) 2016   HbA1c 5.7-5.8.   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 Wyatt Mage sign of recurrent dz as of this 05/2016 f/u.  Released from surveillance as of 06/03/16; return to urol prn.   Seasonal allergic rhinitis    Solitary left kidney 2013   Aquired: right nephrectomy for renal cancer    Past Surgical History:  Procedure Laterality Date   CARPAL TUNNEL RELEASE Right 09/2018   CESAREAN SECTION  1983   COLONOSCOPY  04/2013   Normal.  Repeat 2019 (FH of [facility]   DEXA     GYN scheduled this at 05/12/19 f/u.   ROBOT ASSISTED LAPAROSCOPIC NEPHRECTOMY  07/12/2012   Procedure: ROBOTIC ASSISTED LAPAROSCOPIC NEPHRECTOMY;  Surgeon: Alexis Frock, MD;  Location: [facility];  Service: Urology;  Laterality: Right;   TONSILLECTOMY     TUBAL LIGATION      Outpatient Medications Prior to Visit  Medication Sig Dispense Refill   acetaminophen (TYLENOL) 500 MG tablet Take 1,000 mg by mouth every 8 (eight) hours as needed.     albuterol (VENTOLIN HFA) 108 (90 Base) MCG/ACT inhaler Inhale 2 puffs into the lungs every 6 (six) hours as needed for wheezing or shortness of breath. (Patient not taking: Reported on 08/05/2019) 1 Inhaler 0   atorvastatin (LIPITOR) 80 MG tablet Take 1 tablet (80 mg total) by mouth daily. 90 tablet 3   azelastine (ASTELIN) 0.1 % nasal spray      cyclobenzaprine (FLEXERIL) 10 MG tablet TAKE  1 TABLET BY MOUTH THREE TIMES A DAY AS NEEDED 90 tablet 5   diphenhydramine-acetaminophen (TYLENOL PM) 25-500 MG TABS Take 1 tablet by mouth at bedtime as needed.     DULoxetine (CYMBALTA) 60 MG capsule TAKE 1 CAPSULE BY MOUTH EVERY DAY 90 capsule 3   gabapentin (NEURONTIN) 300 MG capsule 2 tabs po tid 540 capsule 3   losartan (COZAAR) 100 MG tablet Take 1 tablet (100 mg total) by mouth daily. 90 tablet 3   Multiple Vitamin (MULTIVITAMIN WITH MINERALS) TABS Take 1 tablet by mouth daily.     OVER THE  COUNTER MEDICATION Slimvance - for weight loss     oxyCODONE (OXY IR/ROXICODONE) 5 MG immediate release tablet 1-2 tabs po q6h prn 60 tablet 0   pantoprazole (PROTONIX) 40 MG tablet TAKE 1 TABLET BY MOUTH EVERY DAY 90 tablet 1   promethazine (PHENERGAN) 12.5 MG tablet 1-2 tabs po q6h prn nausea 30 tablet 0   psyllium (METAMUCIL) 58.6 % powder Take 1 packet by mouth as needed.     sennosides-docusate sodium (SENOKOT-S) 8.6-50 MG tablet Take 1 tablet by mouth 2 (two) times daily. While taking pain meds to prevent constipation 30 tablet 0   Wheat Dextrin (BENEFIBER PO) Take by mouth.     No facility-administered medications prior to visit.    Allergies  Allergen Reactions   Codeine Nausea Only   Dilaudid [Hydromorphone Hcl] Nausea And Vomiting    ROS As per HPI  PE: There were no vitals taken for this visit. ***  LABS:  Lab Results  Component Value Date   TSH 1.21 05/04/2019   Lab Results  Component Value Date   WBC 6.7 05/04/2019   HGB 13.9 05/12/2019   HCT 38.1 05/04/2019   MCV 94.1 05/04/2019   PLT 272.0 05/04/2019   Lab Results  Component Value Date   CREATININE 1.01 11/07/2019   BUN 16 11/07/2019   NA 139 11/07/2019   K 4.4 11/07/2019   CL 100 11/07/2019   CO2 28 11/07/2019   Lab Results  Component Value Date   ALT 14 05/04/2019   AST 14 05/04/2019   ALKPHOS 100 05/04/2019   BILITOT 0.3 05/04/2019   Lab Results  Component Value Date   CHOL 239 (H) 05/04/2019   Lab Results  Component Value Date   HDL 68.90 05/04/2019   Lab Results  Component Value Date   LDLCALC 70 04/22/2018   Lab Results  Component Value Date   TRIG 263.0 (H) 05/04/2019   Lab Results  Component Value Date   CHOLHDL 3 05/04/2019   Lab Results  Component Value Date   HGBA1C 5.5 04/22/2018    IMPRESSION AND PLAN:  No problem-specific Assessment & Plan notes found for this encounter.   An After Visit Summary was printed and given to the patient.  FOLLOW UP:  No follow-ups on file.  Signed:  Crissie Sickles, MD           02/20/2020

## 2020-03-12 ENCOUNTER — Ambulatory Visit: Payer: 59 | Admitting: Family Medicine

## 2020-03-12 ENCOUNTER — Other Ambulatory Visit: Payer: Self-pay

## 2020-03-12 ENCOUNTER — Encounter: Payer: Self-pay | Admitting: Family Medicine

## 2020-03-12 VITALS — BP 169/82 | HR 87 | Temp 97.8°F | Resp 16 | Ht 64.5 in | Wt 196.0 lb

## 2020-03-12 DIAGNOSIS — N1831 Chronic kidney disease, stage 3a: Secondary | ICD-10-CM | POA: Diagnosis not present

## 2020-03-12 DIAGNOSIS — M797 Fibromyalgia: Secondary | ICD-10-CM

## 2020-03-12 DIAGNOSIS — G894 Chronic pain syndrome: Secondary | ICD-10-CM

## 2020-03-12 DIAGNOSIS — M25561 Pain in right knee: Secondary | ICD-10-CM | POA: Diagnosis not present

## 2020-03-12 DIAGNOSIS — I10 Essential (primary) hypertension: Secondary | ICD-10-CM

## 2020-03-12 DIAGNOSIS — M25562 Pain in left knee: Secondary | ICD-10-CM

## 2020-03-12 DIAGNOSIS — G8929 Other chronic pain: Secondary | ICD-10-CM

## 2020-03-12 DIAGNOSIS — M170 Bilateral primary osteoarthritis of knee: Secondary | ICD-10-CM

## 2020-03-12 DIAGNOSIS — Z79899 Other long term (current) drug therapy: Secondary | ICD-10-CM

## 2020-03-12 MED ORDER — OXYCODONE HCL 5 MG PO TABS
ORAL_TABLET | ORAL | 0 refills | Status: DC
Start: 2020-03-12 — End: 2020-06-11

## 2020-03-12 MED ORDER — OXYCODONE HCL 5 MG PO TABS
ORAL_TABLET | ORAL | 0 refills | Status: DC
Start: 2020-03-12 — End: 2020-03-12

## 2020-03-12 NOTE — Progress Notes (Signed)
OFFICE VISIT  03/12/2020   CC:  Chief Complaint  Patient presents with  . Follow-up    RCI, pt is not fasting   HPI:    [name] who presents for 4 mo f/u chronic pain syndrome, CRI III, HTN. A/P as of last visit: "1) Chronic pain syndrome: bilat knee osteoarthritis with associated compensatory LL pains intermittently. Fibromyalgia syndrome. Doing reasonably well on oxycodone, prn flexeril, gabapentin, prn tylenol, RARE dose of NSAID. I did electronic rx's for oxycodone 5mg , 1-2 q6h prn, #60 today for each of the next 3 months.  Appropriate fill on/after date was noted on each rx.  2) HTN: The current medical regimen is effective;  continue present plan and medications. Lytes/cr today.  3) CRI III: avoiding NSAIDs. Emphasized good hydration habits. 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 Indication for chronic opioid:chronic lumbar and bilat knee osteoarthritis + fibromyalgia pain that I treat with pain meds in order to maximize functioning and quality of life. Medication and dose:oxycodone 5mg  # pills per month:70 Last UDS date:05/04/19 (appropriate results). Opioid Treatment Agreement signed (Y/N):Y,05/04/19 Opioid Treatment Agreement last reviewed with patient:today. PMP AWARE reviewed today: most recent rx for oxycodone 5mg  was filled 01/28/20, # 54, rx by me. No red flags. Requiring 1-2 oxyc per day, rare day to need more.  Ran out of meds a couple weeks ago. No adverse side effects.  Helps pain adequately.    Drinking lots of water.  Avoids NSAIDs. Going to gym less often but plans on returning to elliptical exerciser. Has gained 14 lb in last 4 mo, says pain inhibiting adequat exercise.  She remains active with house and yard work.  Home bp's: some diastolics low 44Y.  Systolics 185U. Ran out of bp med few days ago, has not had dose  today.  Has been out of atorvastatin for about 3-4 wks.  Past Medical History:  Diagnosis Date  . Bilateral carpal tunnel syndrome    pt to get bilat carpal tunnel release as of 09/2018 ortho eval  . BPPV (benign paroxysmal positional vertigo)   . Chronic pain syndrome   . Chronic renal insufficiency, stage III (moderate)    GFR 50's  . Fibromyalgia   . GERD (gastroesophageal reflux disease)   . Heart murmur   . Hemorrhoids   . History of bronchitis    childhood, wood stove and coal heat  . History of galactorrhea 02/05/2011  . Hyperlipidemia, mixed   . Hypertension   . IFG (impaired fasting glucose) 2016   HbA1c 5.7-5.8.  Marland Kitchen Knee pain, bilateral 02/05/2011  . Lumbar spondylosis   . Overweight(278.02)   . 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 Wyatt Mage sign of recurrent dz as of this 05/2016 f/u.  Released from surveillance as of 06/03/16; return to urol prn.  . Seasonal allergic rhinitis   . Solitary left kidney 2013   Aquired: right nephrectomy for renal cancer    Past Surgical History:  Procedure Laterality Date  . CARPAL TUNNEL RELEASE Right 09/2018  . CESAREAN SECTION  1983  . COLONOSCOPY  04/2013   Normal.  Repeat 2019 (FH of [facility]  . DEXA     GYN scheduled this at 05/12/19 f/u.  Marland Kitchen ROBOT ASSISTED LAPAROSCOPIC NEPHRECTOMY  07/12/2012   Procedure: ROBOTIC ASSISTED LAPAROSCOPIC  NEPHRECTOMY;  Surgeon: Alexis Frock, MD;  Location: [facility];  Service: Urology;  Laterality: Right;  . TONSILLECTOMY    . TUBAL LIGATION      Outpatient Medications Prior to Visit  Medication Sig Dispense Refill  . acetaminophen (TYLENOL) 500 MG tablet Take 1,000 mg by mouth every 8 (eight) hours as needed.    Marland Kitchen albuterol (VENTOLIN HFA) 108 (90 Base) MCG/ACT inhaler Inhale 2 puffs into the lungs every 6 (six) hours as needed for wheezing or shortness of breath. 1 Inhaler 0   . atorvastatin (LIPITOR) 80 MG tablet Take 1 tablet (80 mg total) by mouth daily. 90 tablet 3  . azelastine (ASTELIN) 0.1 % nasal spray     . azithromycin (ZITHROMAX) 250 MG tablet Take by mouth.    . benzonatate (TESSALON) 100 MG capsule Take 200 mg by mouth 3 (three) times daily as needed.    . cyclobenzaprine (FLEXERIL) 10 MG tablet TAKE 1 TABLET BY MOUTH THREE TIMES A DAY AS NEEDED 90 tablet 5  . diphenhydramine-acetaminophen (TYLENOL PM) 25-500 MG TABS Take 1 tablet by mouth at bedtime as needed.    . DULoxetine (CYMBALTA) 60 MG capsule TAKE 1 CAPSULE BY MOUTH EVERY DAY 90 capsule 3  . gabapentin (NEURONTIN) 300 MG capsule 2 tabs po tid 540 capsule 3  . losartan (COZAAR) 100 MG tablet Take 1 tablet (100 mg total) by mouth daily. 90 tablet 3  . Multiple Vitamin (MULTIVITAMIN WITH MINERALS) TABS Take 1 tablet by mouth daily.    Marland Kitchen OVER THE COUNTER MEDICATION Slimvance - for weight loss    . pantoprazole (PROTONIX) 40 MG tablet TAKE 1 TABLET BY MOUTH EVERY DAY 90 tablet 1  . predniSONE (DELTASONE) 5 MG tablet Take by mouth.    . promethazine (PHENERGAN) 12.5 MG tablet 1-2 tabs po q6h prn nausea 30 tablet 0  . sennosides-docusate sodium (SENOKOT-S) 8.6-50 MG tablet Take 1 tablet by mouth 2 (two) times daily. While taking pain meds to prevent constipation 30 tablet 0  . oxyCODONE (OXY IR/ROXICODONE) 5 MG immediate release tablet 1-2 tabs po q6h prn 60 tablet 0  . psyllium (METAMUCIL) 58.6 % powder Take 1 packet by mouth as needed. (Patient not taking: Reported on 03/12/2020)    . Wheat Dextrin (BENEFIBER PO) Take by mouth. (Patient not taking: Reported on 03/12/2020)     No facility-administered medications prior to visit.    Allergies  Allergen Reactions  . Codeine Nausea Only  . Dilaudid [Hydromorphone Hcl] Nausea And Vomiting    ROS As per HPI  PE: Vitals with BMI 03/12/2020 11/07/2019 08/11/2019  Height 5' 4.5" 5' 4.5" -  Weight 196 lbs 182 lbs 6 oz -  BMI 51.02 58.52 -  Systolic  778 242 353  Diastolic 82 73 84  Pulse 87 76 98  O2 sat on RA is 94% Manual bp repeat at end of visit was 130/80  Gen: Alert, well appearing.  Patient is oriented to person, place, time, and situation. CV: RRR, no m/r/g.   LUNGS: CTA bilat, nonlabored resps, good aeration in all lung fields. EXT: no clubbing or cyanosis.  no edema.    LABS:  Lab Results  Component Value Date   TSH 1.21 05/04/2019   Lab Results  Component Value Date   WBC 6.7 05/04/2019   HGB 13.9 05/12/2019   HCT 38.1 05/04/2019   MCV 94.1 05/04/2019   PLT 272.0 05/04/2019   Lab Results  Component Value Date   CREATININE 1.01 11/07/2019  BUN 16 11/07/2019   NA 139 11/07/2019   K 4.4 11/07/2019   CL 100 11/07/2019   CO2 28 11/07/2019   Lab Results  Component Value Date   ALT 14 05/04/2019   AST 14 05/04/2019   ALKPHOS 100 05/04/2019   BILITOT 0.3 05/04/2019   Lab Results  Component Value Date   CHOL 239 (H) 05/04/2019   Lab Results  Component Value Date   HDL 68.90 05/04/2019   Lab Results  Component Value Date   LDLCALC 70 04/22/2018   Lab Results  Component Value Date   TRIG 263.0 (H) 05/04/2019   Lab Results  Component Value Date   CHOLHDL 3 05/04/2019   Lab Results  Component Value Date   HGBA1C 5.5 04/22/2018    IMPRESSION AND PLAN:  1) HTN: The current medical regimen is effective;  continue present plan and medications. Continue losartan 100 mg qd. Lytes/cr today.  2) CRI III, in context of unilateral kidney. Lytes/cr monitoring today and q3 mo. Good bp control. Good hydration.  3) Chronic pain syndrome: chronic bilat knee osteoarthritis + fibromyalgia. Stable, ongoing need for low amounts of oxycodone to maximize quality of life and functioning. CSC UTD. UDS today. She be clear b/c she has been out of oxycodone for weeks.  An After Visit Summary was printed and given to the patient.  FOLLOW UP: Return in about 3 months (around 06/12/2020).  Signed:  Crissie Sickles, MD           03/12/2020

## 2020-03-13 LAB — BASIC METABOLIC PANEL
BUN: 19 mg/dL (ref 6–23)
CO2: 26 mEq/L (ref 19–32)
Calcium: 9.5 mg/dL (ref 8.4–10.5)
Chloride: 101 mEq/L (ref 96–112)
Creatinine, Ser: 1.06 mg/dL (ref 0.40–1.20)
GFR: 52.49 mL/min — ABNORMAL LOW (ref >60.00)
Glucose, Bld: 107 mg/dL — ABNORMAL HIGH (ref 70–99)
Potassium: 4.6 mEq/L (ref 3.5–5.1)
Sodium: 138 mEq/L (ref 135–145)

## 2020-03-14 LAB — DRUG MONITORING, PANEL 8 WITH CONFIRMATION, URINE
6 Acetylmorphine: NEGATIVE ng/mL (ref <10)
Alcohol Metabolites: NEGATIVE ng/mL
Amphetamines: NEGATIVE ng/mL (ref <500)
Benzodiazepines: NEGATIVE ng/mL (ref <100)
Buprenorphine, Urine: NEGATIVE ng/mL (ref <5)
Cocaine Metabolite: NEGATIVE ng/mL (ref <150)
Codeine: NEGATIVE ng/mL (ref <50)
Creatinine: 54.4 mg/dL
Hydrocodone: 486 ng/mL — ABNORMAL HIGH (ref <50)
Hydromorphone: 52 ng/mL — ABNORMAL HIGH (ref <50)
MDMA: NEGATIVE ng/mL (ref <500)
Marijuana Metabolite: NEGATIVE ng/mL (ref <20)
Morphine: NEGATIVE ng/mL (ref <50)
Norhydrocodone: 385 ng/mL — ABNORMAL HIGH (ref <50)
Opiates: POSITIVE ng/mL — AB (ref <100)
Oxidant: NEGATIVE ug/mL
Oxycodone: NEGATIVE ng/mL (ref <100)
pH: 5.4 (ref 4.5–9.0)

## 2020-03-14 LAB — DM TEMPLATE

## 2020-03-15 NOTE — Progress Notes (Signed)
Notified via My Chart

## 2020-03-21 ENCOUNTER — Telehealth: Payer: Self-pay

## 2020-03-21 ENCOUNTER — Other Ambulatory Visit: Payer: Self-pay

## 2020-03-21 MED ORDER — ATORVASTATIN CALCIUM 80 MG PO TABS
80.0000 mg | ORAL_TABLET | Freq: Every day | ORAL | 1 refills | Status: DC
Start: 2020-03-21 — End: 2020-10-31

## 2020-03-21 NOTE — Telephone Encounter (Signed)
Patient was in to see Dr. Anitra Lauth on 8/30.  A new prescription was to be sent into [facility] for Chol meds. Patient keeps checking at [facility] since her appt and they dont have anything for her. So she calls here, I dont see where we sent anything in for her.   Can we possibly do this for her today?  If not tomorrow morning at the latest?   atorvastatin (LIPITOR) 80 MG tablet  [place], [place]

## 2020-03-21 NOTE — Telephone Encounter (Signed)
Patient made aware refill sent.  

## 2020-05-08 ENCOUNTER — Telehealth: Payer: Self-pay

## 2020-05-08 NOTE — Telephone Encounter (Signed)
Spoke with patient and got more recent BP readings; 141/75 in left arm, 140/76 in right arm. She does have f/u appt set for next month but unsure if it needed to be sooner.  Please advise, thanks.

## 2020-05-08 NOTE — Telephone Encounter (Signed)
Patient Name: [name] Gender: Female DOB: January 02, 1956 Age: 64 Y 78 M 14 D Return Phone Number: [phone] (Primary), [phone] (Secondary) [place] [address] Client [facility] - Client Client Site [place] - Day Physician Crissie Sickles - MD Contact Type Call Who Is Calling Patient / Member / Family / Caregiver Call Type Triage / Clinical Relationship To Patient Self Return Phone Number [phone] (Primary) Chief Complaint CHEST PAIN (>=21 years) - pain, pressure, heaviness or tightness Reason for Call Symptomatic / Request for [place] has chest pain and high blood pressure. Translation No Nurse Assessment Nurse: Raphael Gibney, RN, Vanita Ingles Date/Time (Eastern Time): 05/08/2020 10:35:18 AM Confirm and document reason for call. If symptomatic, describe symptoms. ---Caller states she had chest pressure last night but not now BP 176/95. she takes losartan 100 mg and took another losartan 50 mg and 166/92 in about 15 min. Took another 50 mg and BP is now 155/81 in left and 155/87 in right. Does the patient have any new or worsening symptoms? ---Yes Will a triage be completed? ---Yes Related visit to physician within the last 2 weeks? ---No Does the PT have any chronic conditions? (i.e. diabetes, asthma, this includes High risk factors for pregnancy, etc.) ---Yes List chronic conditions. ---HTN; fibromyalgia; heart murmur Is this a behavioral health or substance abuse call? ---No Guidelines Guideline Title Affirmed Question Affirmed Notes Nurse Date/Time (Eastern Time) Heart Rate and Heartbeat Questions [1] Skipped or extra beat(s) AND [2] occurs 4 or more times per minute Raphael Gibney, RN, Vanita Ingles 05/08/2020 10:40:28 AM Disp. Time Eilene Ghazi Time) Disposition Final User 05/08/2020 10:34:06 AM Send to Urgent Ezra Sites 05/08/2020 10:48:15 AM See HCP within 4 Hours (or  PCP triage) Yes Raphael Gibney, RN, Vanita Ingles PLEASE NOTE: All timestamps contained within this report are represented as [place] Standard Time. CONFIDENTIALTY NOTICE: This fax transmission is intended only for the addressee. It contains information that is legally privileged, confidential or otherwise protected from use or disclosure. If you are not the intended recipient, you are strictly prohibited from reviewing, disclosing, copying using or disseminating any of this information or taking any action in reliance on or regarding this information. If you have received this fax in error, please notify us immediately by telephone so that we can arrange for its return to [place]. Phone: [phone], Toll-Free: [phone], Fax: [phone] Page: 2 of 2 Call Id: [id number] [place] Disagree/Comply Comply Caller Understands Yes PreDisposition Call Doctor Care Advice Given Per Guideline SEE HCP (OR PCP TRIAGE) WITHIN 4 HOURS: * IF OFFICE WILL BE OPEN: You need to be seen within the next 3 or 4 hours. Call your doctor (or NP/PA) now or as soon as the office opens. CALL BACK IF: * You become worse CARE ADVICE given per Heart Rate and Heartbeat Questions (Adult) guideline. Comments User: Dannielle Burn, RN Date/Time Eilene Ghazi Time): 05/08/2020 10:47:52 AM no appts available within 4 hrs. pt does not want to go to urgent care ore. Please call pt back regarding appt User: Dannielle Burn, RN Date/Time Eilene Ghazi Time): 05/08/2020 10:48:31 AM states no chest pain or SOB. Referrals GO TO FACILITY REFUSED

## 2020-05-10 NOTE — Telephone Encounter (Signed)
Spoke with patient and most recent BP reading from today was 127/76 in left arm, 132/78 in right arm. Denies feeling dizzy or any other symptoms.

## 2020-05-10 NOTE — Telephone Encounter (Signed)
OK to f/u as already scheduled.

## 2020-05-10 NOTE — Telephone Encounter (Signed)
Pls call and see how she's feeling. Any bp measurements from today?

## 2020-05-11 NOTE — Telephone Encounter (Signed)
Patient advised.

## 2020-06-11 ENCOUNTER — Ambulatory Visit (INDEPENDENT_AMBULATORY_CARE_PROVIDER_SITE_OTHER): Payer: 59 | Admitting: Family Medicine

## 2020-06-11 ENCOUNTER — Other Ambulatory Visit: Payer: Self-pay

## 2020-06-11 VITALS — BP 144/80 | HR 77 | Temp 97.6°F | Resp 16 | Ht 64.5 in | Wt 193.6 lb

## 2020-06-11 DIAGNOSIS — I10 Essential (primary) hypertension: Secondary | ICD-10-CM

## 2020-06-11 DIAGNOSIS — Z905 Acquired absence of kidney: Secondary | ICD-10-CM

## 2020-06-11 DIAGNOSIS — Z0000 Encounter for general adult medical examination without abnormal findings: Secondary | ICD-10-CM

## 2020-06-11 DIAGNOSIS — Z23 Encounter for immunization: Secondary | ICD-10-CM | POA: Diagnosis not present

## 2020-06-11 DIAGNOSIS — N183 Chronic kidney disease, stage 3 unspecified: Secondary | ICD-10-CM

## 2020-06-11 DIAGNOSIS — E7800 Pure hypercholesterolemia, unspecified: Secondary | ICD-10-CM | POA: Diagnosis not present

## 2020-06-11 DIAGNOSIS — G894 Chronic pain syndrome: Secondary | ICD-10-CM | POA: Diagnosis not present

## 2020-06-11 DIAGNOSIS — S6000XD Contusion of unspecified finger without damage to nail, subsequent encounter: Secondary | ICD-10-CM

## 2020-06-11 DIAGNOSIS — S60221D Contusion of right hand, subsequent encounter: Secondary | ICD-10-CM

## 2020-06-11 DIAGNOSIS — M170 Bilateral primary osteoarthritis of knee: Secondary | ICD-10-CM

## 2020-06-11 DIAGNOSIS — M797 Fibromyalgia: Secondary | ICD-10-CM

## 2020-06-11 LAB — COMPREHENSIVE METABOLIC PANEL
ALT: 19 U/L (ref 0–35)
AST: 16 U/L (ref 0–37)
Albumin: 4.4 g/dL (ref 3.5–5.2)
Alkaline Phosphatase: 84 U/L (ref 39–117)
BUN: 16 mg/dL (ref 6–23)
CO2: 25 mEq/L (ref 19–32)
Calcium: 9.2 mg/dL (ref 8.4–10.5)
Chloride: 106 mEq/L (ref 96–112)
Creatinine, Ser: 0.96 mg/dL (ref 0.40–1.20)
GFR: 63.43 mL/min (ref >60.00)
Glucose, Bld: 94 mg/dL (ref 70–99)
Potassium: 4.4 mEq/L (ref 3.5–5.1)
Sodium: 141 mEq/L (ref 135–145)
Total Bilirubin: 0.6 mg/dL (ref 0.2–1.2)
Total Protein: 6.7 g/dL (ref 6.0–8.3)

## 2020-06-11 LAB — LIPID PANEL
Cholesterol: 229 mg/dL — ABNORMAL HIGH (ref 0–200)
HDL: 67.2 mg/dL (ref >39.00)
NonHDL: 162.16
Total CHOL/HDL Ratio: 3
Triglycerides: 294 mg/dL — ABNORMAL HIGH (ref 0.0–149.0)
VLDL: 58.8 mg/dL — ABNORMAL HIGH (ref 0.0–40.0)

## 2020-06-11 LAB — CBC WITH DIFFERENTIAL/PLATELET
Basophils Absolute: 0.1 10*3/uL (ref 0.0–0.1)
Basophils Relative: 1.3 % (ref 0.0–3.0)
Eosinophils Absolute: 0.3 10*3/uL (ref 0.0–0.7)
Eosinophils Relative: 6.4 % — ABNORMAL HIGH (ref 0.0–5.0)
HCT: 38.3 % (ref 36.0–46.0)
Hemoglobin: 12.9 g/dL (ref 12.0–15.0)
Lymphocytes Relative: 35.9 % (ref 12.0–46.0)
Lymphs Abs: 1.6 10*3/uL (ref 0.7–4.0)
MCHC: 33.6 g/dL (ref 30.0–36.0)
MCV: 92.5 fl (ref 78.0–100.0)
Monocytes Absolute: 0.3 10*3/uL (ref 0.1–1.0)
Monocytes Relative: 5.9 % (ref 3.0–12.0)
Neutro Abs: 2.3 10*3/uL (ref 1.4–7.7)
Neutrophils Relative %: 50.5 % (ref 43.0–77.0)
Platelets: 232 10*3/uL (ref 150.0–400.0)
RBC: 4.14 Mil/uL (ref 3.87–5.11)
RDW: 13.6 % (ref 11.5–15.5)
WBC: 4.5 10*3/uL (ref 4.0–10.5)

## 2020-06-11 LAB — LDL CHOLESTEROL, DIRECT: Direct LDL: 128 mg/dL

## 2020-06-11 LAB — TSH: TSH: 0.85 u[IU]/mL (ref 0.35–4.50)

## 2020-06-11 MED ORDER — OXYCODONE HCL 5 MG PO TABS: ORAL_TABLET | ORAL | 0 refills | Status: DC

## 2020-06-11 MED ORDER — AMLODIPINE BESY-BENAZEPRIL HCL 5-20 MG PO CAPS: 1.0000 | ORAL_CAPSULE | Freq: Every day | ORAL | 0 refills | Status: DC

## 2020-06-11 NOTE — Patient Instructions (Signed)
Check blood pressure and heart rate daily.  Take ONE of your new blood pressure pills per day (amlodipine-benazepril). If bp not <130 on top and <80 on bottom in 5 days then increase to TWO of these pills once a day.

## 2020-06-11 NOTE — Progress Notes (Signed)
CC: Health Maintenance Exam and 3 mo f/u chronic illnesses.  HPI:  [name] is a 64 y.o. white female who is here for annual health maintenance exam and for 3 mo f/u chronic pain syndrome, HTN, HLD, and CRI III.  Today, [name] reports that she had fallen on out stretched hands last Tuesday while she was out for a walk. She states that she went to urgent care and had x ray performed on right hand that did not reveal any fractures. She noted that she initially had a "bump" on her right hand that was the size of a "golf ball" over the dorsal aspect of her 3rd MCP joint. Since last Tuesday, the bump has significantly reduced in size. Patient still endorsing pain on the dorsal aspect of hand near 3rd, 4th, and 5th MCP joints, as well as lateral aspect of 5th digit. Patient tried ice and prescription pain medication with good effect.  Patient's BP at today's visit is 144/80. Patient reports home blood pressure readings that average around 659 systolic. Patient has been taking losartan 100 mg qd as prescribed and has not missed any doses.  Patient also endorsing increase in intensity of chronic pain. She cites changing weather/season as inciting event.  Patient reporting "left kidney pain." She called her nephrologist and is going to make an appointment in the near future. Patient reports increased urinary urgency. Patient denies dysuria or urinary incontinence.  Patient has been exercising more often in the form of walks outside at the local park. She reports that her diet has not been great secondary to the holidays, but states that she has been trying to improve her diet and decreased amount of "carbs and sugars" in her diet.    PMH: Past Medical History:  Diagnosis Date  . Bilateral carpal tunnel syndrome    pt to get bilat carpal tunnel release as of 09/2018 ortho eval  . BPPV (benign paroxysmal positional vertigo)   . Chronic pain syndrome   . Chronic renal insufficiency, stage III  (moderate)    GFR 50's  . Fibromyalgia   . GERD (gastroesophageal reflux disease)   . Heart murmur   . Hemorrhoids   . History of bronchitis    childhood, wood stove and coal heat  . History of galactorrhea 02/05/2011  . Hyperlipidemia, mixed   . Hypertension   . IFG (impaired fasting glucose) 2016   HbA1c 5.7-5.8.  Marland Kitchen Knee pain, bilateral 02/05/2011  . Lumbar spondylosis   . Overweight(278.02)   . 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 Wyatt Mage sign of recurrent dz as of this 05/2016 f/u.  Released from surveillance as of 06/03/16; return to urol prn.  . Seasonal allergic rhinitis   . Solitary left kidney 2013   Aquired: right nephrectomy for renal cancer    M/A: Current Outpatient Medications on File Prior to Visit  Medication Sig Dispense Refill  . acetaminophen (TYLENOL) 500 MG tablet Take 1,000 mg by mouth every 8 (eight) hours as needed.    Marland Kitchen albuterol (VENTOLIN HFA) 108 (90 Base) MCG/ACT inhaler Inhale 2 puffs into the lungs every 6 (six) hours as needed for wheezing or shortness of breath. 1 Inhaler 0  . atorvastatin (LIPITOR) 80 MG tablet Take 1 tablet (80 mg total) by mouth daily. 90 tablet 1  . azelastine (ASTELIN) 0.1 % nasal spray     . azithromycin (ZITHROMAX) 250 MG tablet Take  by mouth.    . benzonatate (TESSALON) 100 MG capsule Take 200 mg by mouth 3 (three) times daily as needed.    . cyclobenzaprine (FLEXERIL) 10 MG tablet TAKE 1 TABLET BY MOUTH THREE TIMES A DAY AS NEEDED 90 tablet 5  . diphenhydramine-acetaminophen (TYLENOL PM) 25-500 MG TABS Take 1 tablet by mouth at bedtime as needed.    . DULoxetine (CYMBALTA) 60 MG capsule TAKE 1 CAPSULE BY MOUTH EVERY DAY 90 capsule 3  . gabapentin (NEURONTIN) 300 MG capsule 2 tabs po tid 540 capsule 3  . losartan (COZAAR) 100 MG tablet Take 1 tablet (100 mg total) by mouth daily. 90 tablet 3  .  Multiple Vitamin (MULTIVITAMIN WITH MINERALS) TABS Take 1 tablet by mouth daily.    Marland Kitchen OVER THE COUNTER MEDICATION Slimvance - for weight loss    . oxyCODONE (OXY IR/ROXICODONE) 5 MG immediate release tablet 1-2 tabs po q6h prn 60 tablet 0  . pantoprazole (PROTONIX) 40 MG tablet TAKE 1 TABLET BY MOUTH EVERY DAY 90 tablet 1  . predniSONE (DELTASONE) 5 MG tablet Take by mouth.    . promethazine (PHENERGAN) 12.5 MG tablet 1-2 tabs po q6h prn nausea 30 tablet 0  . psyllium (METAMUCIL) 58.6 % powder Take 1 packet by mouth as needed. (Patient not taking: Reported on 03/12/2020)    . sennosides-docusate sodium (SENOKOT-S) 8.6-50 MG tablet Take 1 tablet by mouth 2 (two) times daily. While taking pain meds to prevent constipation 30 tablet 0  . Wheat Dextrin (BENEFIBER PO) Take by mouth. (Patient not taking: Reported on 03/12/2020)     No current facility-administered medications on file prior to visit.   Allergies  Allergen Reactions  . Codeine Nausea Only  . Dilaudid [Hydromorphone Hcl] Nausea And Vomiting    FH: Family History  Problem Relation Age of Onset  . Thyroid disease Mother   . Heart disease Mother        CHF, heart murmur, PAD  . Kidney disease Mother        RAS  . Other Mother 20       bowel obstruction,surgery this year  . Colon cancer Father 75  . Cancer Sister        cervical, breast  . Thyroid disease Sister   . Heart disease Brother   . Thyroid disease Maternal Grandfather   . Cancer Maternal Grandfather   . Diabetes Paternal Grandmother   . Cancer Paternal Grandmother        breast  . Heart disease Paternal Grandmother   . Heart disease Paternal Grandfather   . Diabetes Paternal Grandfather   . Hypertension Paternal Grandfather   . Thyroid disease Sister   . Diabetes Sister   . Diabetes Sister   . Hypertension Sister   . Heart disease Maternal Grandmother   . Thyroid disease Maternal Grandmother   . Cancer Sister        kidney  . Asthma Daughter   .  Allergies Daughter   . Lung disease Son        recurrent bronchitis  . Lung disease Daughter        recurrent bronchitis  . Cancer Paternal Aunt        breast cancer    SH: Social History   Socioeconomic History  . Marital status: Married    Spouse name: Jerrye Beavers  . Number of children: 3  . Years of education: Not on file  . Highest education level: Not on file  Occupational History  .  Not on file  Tobacco Use  . Smoking status: Former Smoker    Packs/day: 2.00    Years: 5.00    Pack years: 10.00    Types: Cigarettes    Quit date: 07/14/1981    Years since quitting: 38.9  . Smokeless tobacco: Never Used  Vaping Use  . Vaping Use: Never used  Substance and Sexual Activity  . Alcohol use: No  . Drug use: No  . Sexual activity: Yes  Other Topics Concern  . Not on file  Social History Narrative   Married, 3 children.   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   No tobacco, alcohol, or drugs.  No exercise.   Social Determinants of Health   Financial Resource Strain:   . Difficulty of Paying Living Expenses: Not on file  Food Insecurity:   . Worried About Charity fundraiser in the Last Year: Not on file  . Ran Out of Food in the Last Year: Not on file  Transportation Needs:   . Lack of Transportation (Medical): Not on file  . Lack of Transportation (Non-Medical): Not on file  Physical Activity:   . Days of Exercise per Week: Not on file  . Minutes of Exercise per Session: Not on file  Stress:   . Feeling of Stress : Not on file  Social Connections:   . Frequency of Communication with Friends and Family: Not on file  . Frequency of Social Gatherings with Friends and Family: Not on file  . Attends Religious Services: Not on file  . Active Member of Clubs or Organizations: Not on file  . Attends Archivist Meetings: Not on file  . Marital Status: Not on file    ROS: Review of Systems  Constitutional:  Negative for chills and fever.  Cardiovascular: Negative for chest pain and palpitations.  Gastrointestinal: Negative for abdominal pain.  Genitourinary: Positive for flank pain and urgency. Negative for dysuria.  Musculoskeletal: Positive for falls, joint pain and myalgias.  Neurological: Negative for dizziness and loss of consciousness.    PE: Vitals with BMI 03/12/2020 11/07/2019 08/11/2019  Height 5' 4.5" 5' 4.5" -  Weight 196 lbs 182 lbs 6 oz -  BMI 60.63 01.60 -  Systolic 109 323 557  Diastolic 82 73 84  Pulse 87 76 98    Physical Exam Constitutional:      General: She is not in acute distress.    Appearance: Normal appearance. She is not ill-appearing.  HENT:     Head: Normocephalic and atraumatic.     Nose: Nose normal.     Mouth/Throat:     Mouth: Mucous membranes are moist.     Pharynx: Oropharynx is clear.  Eyes:     Extraocular Movements: Extraocular movements intact.     Pupils: Pupils are equal, round, and reactive to light.  Cardiovascular:     Rate and Rhythm: Normal rate and regular rhythm.     Pulses: Normal pulses.     Heart sounds: Normal heart sounds. No murmur heard.  No friction rub. No gallop.   Pulmonary:     Effort: Pulmonary effort is normal.     Breath sounds: Normal breath sounds.  Abdominal:     General: Abdomen is flat. Bowel sounds are normal.     Palpations: Abdomen is soft.  Musculoskeletal:     Right hand: Swelling and tenderness present. Normal range of motion.  Left hand: Normal.       Back:     Comments: 2 cm x 2 cm raised fluctuant, fluid filled mass over dorsal aspect of 3rd MCP joint of right hand. Tender to palpation of dorsal right hand near 3rd, 4th, and 5 th MCP joints. Full ROM of right hand. Mild decreased finger abduction strength secondary to pain.  Patient "sore" to light palpation of left lower back.  Neurological:     Mental Status: She is alert.     Labs: No results found for this or any previous visit (from  the past 2160 hour(s)).   A/P: In summary, Nykiah Ma is a 64 y.o. year old female with a past medical history of CRI III, HLD, HTN, and chronic pain syndrome who presents to the clinic today for annual health maintenance exam and 3 mo f/u for chronic illnesses.   1) Right hand injury: Stemming from fall on out stretched hand last Tuesday. Physical exam revealed hematoma over 3rd MCP joint that has significantly reduced in size since initial insult. Patient still endorsing pain over dorsal aspect of hand that is tender to palpation. Patient seen at urgent care with xray negative for fracture. Reassuring patient has full ROM of hand. Patient has hand splint at home. Overall, hand is improving, and this will likely resolve without intervention. - PRN splinting.  2) HTN: uncontrolled. BP today 144/80. Patient reports at home BP readings averaging 081K systolic. Patient was taking losartan 100 mg qd as prescribed without missing a dose. Will change BP medication. Instructed patient to monitor BP at home. Educated patient on potential signs of hypotension, including light-headedness, dizziness, and LOC. - Discontinue Losartan - Start Lotrel 5-20 mg capsule PO qd The current medical regimen is effective; continue present plan and medications. Lytes/cr today.  3) CRI III, in context of unilateral kidney. Patient endorsing some left lower back pain. Tender to light palpation, which patient describes as "sore." This is likely MSK in nature, potentially secondary to fall. Patient would like to see nephrologist, and states that she is going to set up appointment in the near future to be evaluated. Lytes/cr monitoring today and q3 mo. Good bp control. Good hydration.  4) Chronic pain syndrome: chronic bilat knee osteoarthritis + fibromyalgia. Intensity of chronic pain increased over past month. Patient states this is secondary to weather change. Currently taking oxycodone 5 mg q6h PRN. Recently went down  on the quantity of oxycodone prescribed. Patient feels as though she will need more pain medication over the next few months as the pain has increased. Will increase quantity of oxycodone and re-evaluate at next visit to see if we can decrease pain medication to maximize quality of life. CSC today. UDS today.  5) Health Maintenance Exam: We reviewed age and gender appropriate health maintenance issues (prudent diet, regular exercise, health risks of tobacco and excessive alcohol, use of seatbelts, fire alarms in home, use of sunscreen). Also reviewed age and gender appropriate health screening as well as vaccine recommendations.  Labs: check a CBC, CMP, TSH, and FLP. Vaccines: flu-->today.  Otherwise all UTD. Cervical ca screening: per GYN MD, Dr. Matthew Saras. Breast ca screening: per GYN MD, Dr. Matthew Saras. Colon ca screening: most recent colonoscopy was 2014, she is on q5y plan for FH colon ca->pt aware and will call GI for this when can.   Lab Orders  No laboratory test(s) ordered today     Follow Up:  3 months for follow-up for chronic illnesses Signed: Maylon Cos  Beatriz Chancellor, [place]

## 2020-06-11 NOTE — Progress Notes (Signed)
See med student note from today. I personally was present during the history, physical exam, and medical decision-making activities of this service and have verified that the service and findings are accurately documented in the student's note. Instructions for pt:   Check blood pressure and heart rate daily. Take ONE of your new blood pressure pills per day (amlodipine-benazepril). If bp not <130 on top and <80 on bottom in 5 days then increase to TWO of these pills once a day." BMET today and again at f/u in office in 2 wks.  Also, I sent in rx's for oxycodone 5mg , 1-2 q6h prn, #90 for each of the next 3 mo.   Appropriate fill on/after dates on rx's. We'll do #90 through the winter and likely be able to drop back to #60/mo in the spring when her pain is generally less severe.  Signed:  Crissie Sickles, MD           06/11/2020

## 2020-06-25 ENCOUNTER — Ambulatory Visit: Payer: 59 | Admitting: Family Medicine

## 2020-06-25 DIAGNOSIS — Z0289 Encounter for other administrative examinations: Secondary | ICD-10-CM

## 2020-06-25 NOTE — Progress Notes (Deleted)
OFFICE VISIT  06/25/2020  CC: No chief complaint on file.   HPI:    [name] who presents for 2 wk f/u uncontrolled HTN in the setting of CRI III with one kidney. A/P as of last visit: "HTN: uncontrolled. BP today 144/80. Patient reports at home BP readings averaging 976B systolic. Patient was taking losartan 100 mg qd as prescribed without missing a dose. Will change BP medication. Instructed patient to monitor BP at home. Educated patient on potential signs of hypotension, including light-headedness, dizziness, and LOC. - Discontinue Losartan - Start Lotrel 5-20 mg capsule PO qd The current medical regimen is effective; continue present plan and medications. Lytes/cr today."  INTERIM HX: ***    Past Medical History:  Diagnosis Date  . Bilateral carpal tunnel syndrome    pt to get bilat carpal tunnel release as of 09/2018 ortho eval  . BPPV (benign paroxysmal positional vertigo)   . Chronic pain syndrome   . Chronic renal insufficiency, stage III (moderate)    GFR 50's  . Fibromyalgia   . GERD (gastroesophageal reflux disease)   . Heart murmur   . Hemorrhoids   . History of bronchitis    childhood, wood stove and coal heat  . History of galactorrhea 02/05/2011  . Hyperlipidemia, mixed   . Hypertension   . IFG (impaired fasting glucose) 2016   HbA1c 5.7-5.8.  Marland Kitchen Knee pain, bilateral 02/05/2011  . Lumbar spondylosis   . Overweight(278.02)   . 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 Wyatt Mage sign of recurrent dz as of this 05/2016 f/u.  Released from surveillance as of 06/03/16; return to urol prn.  . Seasonal allergic rhinitis   . Solitary left kidney 2013   Aquired: right nephrectomy for renal cancer    Past Surgical History:  Procedure Laterality Date  . CARPAL TUNNEL RELEASE Right 09/2018  . CESAREAN SECTION  1983   . COLONOSCOPY  04/2013   Normal.  Repeat 2019 (FH of [facility]  . DEXA     GYN scheduled this at 05/12/19 f/u.  Marland Kitchen ROBOT ASSISTED LAPAROSCOPIC NEPHRECTOMY  07/12/2012   Procedure: ROBOTIC ASSISTED LAPAROSCOPIC NEPHRECTOMY;  Surgeon: Alexis Frock, MD;  Location: [facility];  Service: Urology;  Laterality: Right;  . TONSILLECTOMY    . TUBAL LIGATION      Outpatient Medications Prior to Visit  Medication Sig Dispense Refill  . acetaminophen (TYLENOL) 500 MG tablet Take 1,000 mg by mouth every 8 (eight) hours as needed.    Marland Kitchen albuterol (VENTOLIN HFA) 108 (90 Base) MCG/ACT inhaler Inhale 2 puffs into the lungs every 6 (six) hours as needed for wheezing or shortness of breath. 1 Inhaler 0  . amLODipine-benazepril (LOTREL) 5-20 MG capsule Take 1 capsule by mouth daily. 30 capsule 0  . atorvastatin (LIPITOR) 80 MG tablet Take 1 tablet (80 mg total) by mouth daily. 90 tablet 1  . azelastine (ASTELIN) 0.1 % nasal spray     . benzonatate (TESSALON) 100 MG capsule Take 200 mg by mouth 3 (three) times daily as needed.    . cyclobenzaprine (FLEXERIL) 10 MG tablet TAKE 1 TABLET BY MOUTH THREE TIMES A DAY AS NEEDED 90 tablet 5  . diphenhydramine-acetaminophen (TYLENOL PM) 25-500 MG TABS Take 1 tablet by mouth at bedtime as needed.    . DULoxetine (CYMBALTA) 60 MG capsule TAKE 1 CAPSULE BY MOUTH EVERY DAY 90 capsule  3  . gabapentin (NEURONTIN) 300 MG capsule 2 tabs po tid 540 capsule 3  . Multiple Vitamin (MULTIVITAMIN WITH MINERALS) TABS Take 1 tablet by mouth daily. (Patient not taking: Reported on 06/11/2020)    . OVER THE COUNTER MEDICATION Slimvance - for weight loss    . oxyCODONE (OXY IR/ROXICODONE) 5 MG immediate release tablet 1-2 tabs po q6h prn 90 tablet 0  . pantoprazole (PROTONIX) 40 MG tablet TAKE 1 TABLET BY MOUTH EVERY DAY 90 tablet 1  . promethazine (PHENERGAN) 12.5 MG tablet 1-2 tabs po q6h prn nausea 30 tablet 0  . psyllium (METAMUCIL) 58.6 % powder Take 1 packet by mouth as needed.     .  sennosides-docusate sodium (SENOKOT-S) 8.6-50 MG tablet Take 1 tablet by mouth 2 (two) times daily. While taking pain meds to prevent constipation 30 tablet 0  . Wheat Dextrin (BENEFIBER PO) Take by mouth.      No facility-administered medications prior to visit.    Allergies  Allergen Reactions  . Codeine Nausea Only  . Dilaudid [Hydromorphone Hcl] Nausea And Vomiting    ROS As per HPI  PE: Vitals with BMI 06/11/2020 03/12/2020 11/07/2019  Height 5' 4.5" 5' 4.5" 5' 4.5"  Weight 193 lbs 10 oz 196 lbs 182 lbs 6 oz  BMI 32.73 23.55 73.22  Systolic 025 427 062  Diastolic 80 82 73  Pulse 77 87 76     ***  LABS:  Lab Results  Component Value Date   TSH 0.85 06/11/2020   Lab Results  Component Value Date   WBC 4.5 06/11/2020   HGB 12.9 06/11/2020   HCT 38.3 06/11/2020   MCV 92.5 06/11/2020   PLT 232.0 06/11/2020   Lab Results  Component Value Date   CREATININE 0.96 06/11/2020   BUN 16 06/11/2020   NA 141 06/11/2020   K 4.4 06/11/2020   CL 106 06/11/2020   CO2 25 06/11/2020   Lab Results  Component Value Date   ALT 19 06/11/2020   AST 16 06/11/2020   ALKPHOS 84 06/11/2020   BILITOT 0.6 06/11/2020   Lab Results  Component Value Date   CHOL 229 (H) 06/11/2020   Lab Results  Component Value Date   HDL 67.20 06/11/2020   Lab Results  Component Value Date   LDLCALC 70 04/22/2018   Lab Results  Component Value Date   TRIG 294.0 (H) 06/11/2020   Lab Results  Component Value Date   CHOLHDL 3 06/11/2020   Lab Results  Component Value Date   HGBA1C 5.5 04/22/2018    IMPRESSION AND PLAN:  No problem-specific Assessment & Plan notes found for this encounter.   An After Visit Summary was printed and given to the patient.  FOLLOW UP: No follow-ups on file.  Signed:  Crissie Sickles, MD           06/25/2020

## 2020-06-25 NOTE — Progress Notes (Deleted)
CC: 2 week f/u for uncontrolled HTN  HPI:  [name] is a 64 yo female who presents to the clinic today for 2 week f/u for uncontrolled HTN in the setting of CRI III with one kidney.  Home BP?? *** HR?? *** How many BP meds is she taking rn?? ***  Check blood pressure and heart rate daily. Take ONE of your new blood pressure pills per day (amlodipine-benazepril). If bp not <130 on top and <80 on bottom in 5 days then increase to TWO of these pills once a day." BMET today and again at f/u in office in 2 wks.  Also, I sent in rx's for oxycodone 5mg , 1-2 q6h prn, #90 for each of the next 3 mo.   Appropriate fill on/after dates on rx's. We'll do #90 through the winter and likely be able to drop back to #60/mo in the spring when her pain is generally less severe. Any new falls?? *** How is chronic pain??***  How is R hand pain?? ***    PMH: Past Medical History:  Diagnosis Date  . Bilateral carpal tunnel syndrome    pt to get bilat carpal tunnel release as of 09/2018 ortho eval  . BPPV (benign paroxysmal positional vertigo)   . Chronic pain syndrome   . Chronic renal insufficiency, stage III (moderate)    GFR 50's  . Fibromyalgia   . GERD (gastroesophageal reflux disease)   . Heart murmur   . Hemorrhoids   . History of bronchitis    childhood, wood stove and coal heat  . History of galactorrhea 02/05/2011  . Hyperlipidemia, mixed   . Hypertension   . IFG (impaired fasting glucose) 2016   HbA1c 5.7-5.8.  Marland Kitchen Knee pain, bilateral 02/05/2011  . Lumbar spondylosis   . Overweight(278.02)   . 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 Wyatt Mage sign of recurrent dz as of this 05/2016 f/u.  Released from surveillance as of 06/03/16; return to urol prn.  . Seasonal allergic rhinitis   . Solitary left kidney 2013   Aquired: right nephrectomy for renal cancer     M/A: Current Outpatient Medications on File Prior to Visit  Medication Sig Dispense Refill  . acetaminophen (TYLENOL) 500 MG tablet Take 1,000 mg by mouth every 8 (eight) hours as needed.    Marland Kitchen albuterol (VENTOLIN HFA) 108 (90 Base) MCG/ACT inhaler Inhale 2 puffs into the lungs every 6 (six) hours as needed for wheezing or shortness of breath. 1 Inhaler 0  . amLODipine-benazepril (LOTREL) 5-20 MG capsule Take 1 capsule by mouth daily. 30 capsule 0  . atorvastatin (LIPITOR) 80 MG tablet Take 1 tablet (80 mg total) by mouth daily. 90 tablet 1  . azelastine (ASTELIN) 0.1 % nasal spray     . benzonatate (TESSALON) 100 MG capsule Take 200 mg by mouth 3 (three) times daily as needed.    . cyclobenzaprine (FLEXERIL) 10 MG tablet TAKE 1 TABLET BY MOUTH THREE TIMES A DAY AS NEEDED 90 tablet 5  . diphenhydramine-acetaminophen (TYLENOL PM) 25-500 MG TABS Take 1 tablet by mouth at bedtime as needed.    . DULoxetine (CYMBALTA) 60 MG capsule TAKE 1 CAPSULE BY MOUTH EVERY DAY 90 capsule 3  . gabapentin (NEURONTIN) 300 MG capsule 2 tabs po tid 540 capsule 3  . Multiple Vitamin (MULTIVITAMIN WITH MINERALS) TABS Take 1 tablet by mouth daily. (Patient not taking: Reported on 06/11/2020)    .  OVER THE COUNTER MEDICATION Slimvance - for weight loss    . oxyCODONE (OXY IR/ROXICODONE) 5 MG immediate release tablet 1-2 tabs po q6h prn 90 tablet 0  . pantoprazole (PROTONIX) 40 MG tablet TAKE 1 TABLET BY MOUTH EVERY DAY 90 tablet 1  . promethazine (PHENERGAN) 12.5 MG tablet 1-2 tabs po q6h prn nausea 30 tablet 0  . psyllium (METAMUCIL) 58.6 % powder Take 1 packet by mouth as needed.     . sennosides-docusate sodium (SENOKOT-S) 8.6-50 MG tablet Take 1 tablet by mouth 2 (two) times daily. While taking pain meds to prevent constipation 30 tablet 0  . Wheat Dextrin (BENEFIBER PO) Take by mouth.      No current facility-administered medications on file prior to visit.   Allergies  Allergen Reactions  . Codeine  Nausea Only  . Dilaudid [Hydromorphone Hcl] Nausea And Vomiting    FH: Family History  Problem Relation Age of Onset  . Thyroid disease Mother   . Heart disease Mother        CHF, heart murmur, PAD  . Kidney disease Mother        RAS  . Other Mother 67       bowel obstruction,surgery this year  . Colon cancer Father 45  . Cancer Sister        cervical, breast  . Thyroid disease Sister   . Heart disease Brother   . Thyroid disease Maternal Grandfather   . Cancer Maternal Grandfather   . Diabetes Paternal Grandmother   . Cancer Paternal Grandmother        breast  . Heart disease Paternal Grandmother   . Heart disease Paternal Grandfather   . Diabetes Paternal Grandfather   . Hypertension Paternal Grandfather   . Thyroid disease Sister   . Diabetes Sister   . Diabetes Sister   . Hypertension Sister   . Heart disease Maternal Grandmother   . Thyroid disease Maternal Grandmother   . Cancer Sister        kidney  . Asthma Daughter   . Allergies Daughter   . Lung disease Son        recurrent bronchitis  . Lung disease Daughter        recurrent bronchitis  . Cancer Paternal Aunt        breast cancer    SH: Social History   Socioeconomic History  . Marital status: Married    Spouse name: Jerrye Beavers  . Number of children: 3  . Years of education: Not on file  . Highest education level: Not on file  Occupational History  . Not on file  Tobacco Use  . Smoking status: Former Smoker    Packs/day: 2.00    Years: 5.00    Pack years: 10.00    Types: Cigarettes    Quit date: 07/14/1981    Years since quitting: 38.9  . Smokeless tobacco: Never Used  Vaping Use  . Vaping Use: Never used  Substance and Sexual Activity  . Alcohol use: No  . Drug use: No  . Sexual activity: Yes  Other Topics Concern  . Not on file  Social History Narrative   Married, 3 children.   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   No tobacco,  alcohol, or drugs.  No exercise.   Social Determinants of Health   Financial Resource Strain: Not on file  Food Insecurity: Not on file  Transportation Needs: Not on  file  Physical Activity: Not on file  Stress: Not on file  Social Connections: Not on file    ROS: ROS  PE: Vitals with BMI 06/11/2020 03/12/2020 11/07/2019  Height 5' 4.5" 5' 4.5" 5' 4.5"  Weight 193 lbs 10 oz 196 lbs 182 lbs 6 oz  BMI 32.73 00.17 49.44  Systolic 967 591 638  Diastolic 80 82 73  Pulse 77 87 76    Physical Exam  Labs: Recent Results (from the past 2160 hour(s))  CBC with Differential/Platelet     Status: Abnormal   Collection Time: 06/11/20 11:03 AM  Result Value Ref Range   WBC 4.5 4.0 - 10.5 K/uL   RBC 4.14 3.87 - 5.11 Mil/uL   Hemoglobin 12.9 12.0 - 15.0 g/dL   HCT 38.3 36.0 - 46.0 %   MCV 92.5 78.0 - 100.0 fl   MCHC 33.6 30.0 - 36.0 g/dL   RDW 13.6 11.5 - 15.5 %   Platelets 232.0 150.0 - 400.0 K/uL   Neutrophils Relative % 50.5 43.0 - 77.0 %   Lymphocytes Relative 35.9 12.0 - 46.0 %   Monocytes Relative 5.9 3.0 - 12.0 %   Eosinophils Relative 6.4 (H) 0.0 - 5.0 %   Basophils Relative 1.3 0.0 - 3.0 %   Neutro Abs 2.3 1.4 - 7.7 K/uL   Lymphs Abs 1.6 0.7 - 4.0 K/uL   Monocytes Absolute 0.3 0.1 - 1.0 K/uL   Eosinophils Absolute 0.3 0.0 - 0.7 K/uL   Basophils Absolute 0.1 0.0 - 0.1 K/uL  TSH     Status: None   Collection Time: 06/11/20 11:03 AM  Result Value Ref Range   TSH 0.85 0.35 - 4.50 uIU/mL  Lipid panel     Status: Abnormal   Collection Time: 06/11/20 11:03 AM  Result Value Ref Range   Cholesterol 229 (H) 0 - 200 mg/dL    Comment: ATP III Classification       Desirable:  < 200 mg/dL               Borderline High:  200 - 239 mg/dL          High:  > = 240 mg/dL   Triglycerides 294.0 (H) 0.0 - 149.0 mg/dL    Comment: Normal:  <150 mg/dLBorderline High:  150 - 199 mg/dL   HDL 67.20 >39.00 mg/dL   VLDL 58.8 (H) 0.0 - 40.0 mg/dL   Total CHOL/HDL Ratio 3     Comment:                 Men          Women1/2 Average Risk     3.4          3.3Average Risk          5.0          4.42X Average Risk          9.6          7.13X Average Risk          15.0          11.0                       NonHDL 162.16     Comment: NOTE:  Non-HDL goal should be 30 mg/dL higher than patient's LDL goal (i.e. LDL goal of < 70 mg/dL, would have non-HDL goal of < 100 mg/dL)  Comprehensive metabolic panel     Status: None  Collection Time: 06/11/20 11:03 AM  Result Value Ref Range   Sodium 141 135 - 145 mEq/L   Potassium 4.4 3.5 - 5.1 mEq/L   Chloride 106 96 - 112 mEq/L   CO2 25 19 - 32 mEq/L   Glucose, Bld 94 70 - 99 mg/dL   BUN 16 6 - 23 mg/dL   Creatinine, Ser 0.96 0.40 - 1.20 mg/dL   Total Bilirubin 0.6 0.2 - 1.2 mg/dL   Alkaline Phosphatase 84 39 - 117 U/L   AST 16 0 - 37 U/L   ALT 19 0 - 35 U/L   Total Protein 6.7 6.0 - 8.3 g/dL   Albumin 4.4 3.5 - 5.2 g/dL   GFR 63.43 >60.00 mL/min    Comment: Calculated using the CKD-EPI Creatinine Equation (2021)   Calcium 9.2 8.4 - 10.5 mg/dL  LDL cholesterol, direct     Status: None   Collection Time: 06/11/20 11:03 AM  Result Value Ref Range   Direct LDL 128.0 mg/dL    Comment: Optimal:  <100 mg/dLNear or Above Optimal:  100-129 mg/dLBorderline High:  130-159 mg/dLHigh:  160-189 mg/dLVery High:  >190 mg/dL     A/P: In summary, Charmelle Soh is a 64 y.o. year old female who presents to the clinic today for 2 week follow up for uncontrolled HTN in the setting of CRI III with one kidney. On physical exam, ***.   1) HTN: uncontrolled. BP today 144/80. Patient reports at home BP readings averaging 283M systolic. Patient was taking losartan 100 mg qd as prescribed without missing a dose. Will change BP medication. Instructed patient to monitor BP at home. Educated patient on potential signs of hypotension, including light-headedness, dizziness, and LOC. - Discontinue Losartan - Start Lotrel 5-20 mg capsule PO qd The current medical regimen is  effective; continue present plan and medications. Lytes/cr today.  2) CRI III, in context of unilateral kidney. Patient endorsing some left lower back pain. Tender to light palpation, which patient describes as "sore." This is likely MSK in nature, potentially secondary to fall. Patient would like to see nephrologist, and states that she is going to set up appointment in the near future to be evaluated. Lytes/cr monitoring today and q3 mo. Good bp control. Good hydration.  3) Chronic pain syndrome: chronic bilat knee osteoarthritis + fibromyalgia. Intensity of chronic pain *** CSC UTD *** UDS UTD ***  4) Right hand injury: Stemming from fall on out stretched hand three weeks ago. - PRN splinting ***    Lab Orders  No laboratory test(s) ordered today     Follow Up:   Signed: Nanetta Batty, MS3

## 2020-06-26 ENCOUNTER — Ambulatory Visit: Payer: 59 | Admitting: Family Medicine

## 2020-06-26 NOTE — Progress Notes (Deleted)
OFFICE VISIT  06/26/2020  CC: No chief complaint on file.  HPI:    [name] who presents for 2 week f/u uncontrolled HTN in the setting of CRI III, has only one kidney. D/c'd losartan and started lotrel 5-20 qd last visit. Electrolytes normal and GFR stable at 63 ml/min last visit. ***  Past Medical History:  Diagnosis Date  . Bilateral carpal tunnel syndrome    pt to get bilat carpal tunnel release as of 09/2018 ortho eval  . BPPV (benign paroxysmal positional vertigo)   . Chronic pain syndrome   . Chronic renal insufficiency, stage III (moderate)    GFR 50's  . Fibromyalgia   . GERD (gastroesophageal reflux disease)   . Heart murmur   . Hemorrhoids   . History of bronchitis    childhood, wood stove and coal heat  . History of galactorrhea 02/05/2011  . Hyperlipidemia, mixed   . Hypertension   . IFG (impaired fasting glucose) 2016   HbA1c 5.7-5.8.  Marland Kitchen Knee pain, bilateral 02/05/2011  . Lumbar spondylosis   . Overweight(278.02)   . 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 Wyatt Mage sign of recurrent dz as of this 05/2016 f/u.  Released from surveillance as of 06/03/16; return to urol prn.  . Seasonal allergic rhinitis   . Solitary left kidney 2013   Aquired: right nephrectomy for renal cancer    Past Surgical History:  Procedure Laterality Date  . CARPAL TUNNEL RELEASE Right 09/2018  . CESAREAN SECTION  1983  . COLONOSCOPY  04/2013   Normal.  Repeat 2019 (FH of [facility]  . DEXA     GYN scheduled this at 05/12/19 f/u.  Marland Kitchen ROBOT ASSISTED LAPAROSCOPIC NEPHRECTOMY  07/12/2012   Procedure: ROBOTIC ASSISTED LAPAROSCOPIC NEPHRECTOMY;  Surgeon: Alexis Frock, MD;  Location: [facility];  Service: Urology;  Laterality: Right;  . TONSILLECTOMY    . TUBAL LIGATION      Outpatient Medications Prior to Visit  Medication Sig Dispense Refill   . acetaminophen (TYLENOL) 500 MG tablet Take 1,000 mg by mouth every 8 (eight) hours as needed.    Marland Kitchen albuterol (VENTOLIN HFA) 108 (90 Base) MCG/ACT inhaler Inhale 2 puffs into the lungs every 6 (six) hours as needed for wheezing or shortness of breath. 1 Inhaler 0  . amLODipine-benazepril (LOTREL) 5-20 MG capsule Take 1 capsule by mouth daily. 30 capsule 0  . atorvastatin (LIPITOR) 80 MG tablet Take 1 tablet (80 mg total) by mouth daily. 90 tablet 1  . azelastine (ASTELIN) 0.1 % nasal spray     . benzonatate (TESSALON) 100 MG capsule Take 200 mg by mouth 3 (three) times daily as needed.    . cyclobenzaprine (FLEXERIL) 10 MG tablet TAKE 1 TABLET BY MOUTH THREE TIMES A DAY AS NEEDED 90 tablet 5  . diphenhydramine-acetaminophen (TYLENOL PM) 25-500 MG TABS Take 1 tablet by mouth at bedtime as needed.    . DULoxetine (CYMBALTA) 60 MG capsule TAKE 1 CAPSULE BY MOUTH EVERY DAY 90 capsule 3  . gabapentin (NEURONTIN) 300 MG capsule 2 tabs po tid 540 capsule 3  . Multiple Vitamin (MULTIVITAMIN WITH MINERALS) TABS Take 1 tablet by mouth daily. (Patient not taking: Reported on 06/11/2020)    . OVER THE COUNTER MEDICATION Slimvance - for weight loss    . oxyCODONE (OXY IR/ROXICODONE) 5 MG immediate release tablet 1-2 tabs po q6h prn  90 tablet 0  . pantoprazole (PROTONIX) 40 MG tablet TAKE 1 TABLET BY MOUTH EVERY DAY 90 tablet 1  . promethazine (PHENERGAN) 12.5 MG tablet 1-2 tabs po q6h prn nausea 30 tablet 0  . psyllium (METAMUCIL) 58.6 % powder Take 1 packet by mouth as needed.     . sennosides-docusate sodium (SENOKOT-S) 8.6-50 MG tablet Take 1 tablet by mouth 2 (two) times daily. While taking pain meds to prevent constipation 30 tablet 0  . Wheat Dextrin (BENEFIBER PO) Take by mouth.      No facility-administered medications prior to visit.    Allergies  Allergen Reactions  . Codeine Nausea Only  . Dilaudid [Hydromorphone Hcl] Nausea And Vomiting    ROS As per HPI  PE: Vitals with BMI  06/11/2020 03/12/2020 11/07/2019  Height 5' 4.5" 5' 4.5" 5' 4.5"  Weight 193 lbs 10 oz 196 lbs 182 lbs 6 oz  BMI 32.73 95.09 32.67  Systolic 124 580 998  Diastolic 80 82 73  Pulse 77 87 76     ***  LABS:    Chemistry      Component Value Date/Time   NA 141 06/11/2020 1103   K 4.4 06/11/2020 1103   CL 106 06/11/2020 1103   CO2 25 06/11/2020 1103   BUN 16 06/11/2020 1103   CREATININE 0.96 06/11/2020 1103   CREATININE 0.98 04/22/2018 1526      Component Value Date/Time   CALCIUM 9.2 06/11/2020 1103   ALKPHOS 84 06/11/2020 1103   AST 16 06/11/2020 1103   ALT 19 06/11/2020 1103   BILITOT 0.6 06/11/2020 1103      IMPRESSION AND PLAN:  No problem-specific Assessment & Plan notes found for this encounter.   An After Visit Summary was printed and given to the patient.  FOLLOW UP: No follow-ups on file. Next rci/chronic pain f/u 3 mo  Signed:  Crissie Sickles, MD           06/26/2020

## 2020-06-27 ENCOUNTER — Encounter: Payer: Self-pay | Admitting: Family Medicine

## 2020-06-27 ENCOUNTER — Telehealth (INDEPENDENT_AMBULATORY_CARE_PROVIDER_SITE_OTHER): Payer: 59 | Admitting: Family Medicine

## 2020-06-27 VITALS — BP 131/72 | HR 96

## 2020-06-27 DIAGNOSIS — J0191 Acute recurrent sinusitis, unspecified: Secondary | ICD-10-CM

## 2020-06-27 DIAGNOSIS — I10 Essential (primary) hypertension: Secondary | ICD-10-CM | POA: Diagnosis not present

## 2020-06-27 DIAGNOSIS — J209 Acute bronchitis, unspecified: Secondary | ICD-10-CM | POA: Diagnosis not present

## 2020-06-27 MED ORDER — AMLODIPINE BESY-BENAZEPRIL HCL 10-40 MG PO CAPS: 1.0000 | ORAL_CAPSULE | Freq: Every day | ORAL | 3 refills | Status: DC

## 2020-06-27 MED ORDER — PREDNISONE 20 MG PO TABS: ORAL_TABLET | ORAL | 0 refills | Status: DC

## 2020-06-27 MED ORDER — AZITHROMYCIN 250 MG PO TABS: ORAL_TABLET | ORAL | 0 refills | Status: DC

## 2020-06-27 NOTE — Progress Notes (Signed)
Virtual Visit via Video Note  I connected with pt on 06/27/20 at 11:30 AM EST by a video enabled telemedicine application and verified that I am speaking with the correct person using two identifiers.  Location patient: home, [place] Location provider:work or home office Persons participating in the virtual visit: patient, provider  I discussed the limitations of evaluation and management by telemedicine and the availability of in person appointments. The patient expressed understanding and agreed to proceed.   HPI: 64 y/o WF being seen today for 2 wk f/u uncont htn + having resp sx's lately. A/P as of last visit: "HTN: uncontrolled. BP today 144/80. Patient reports at home BP readings averaging 151V systolic. Patient was taking losartan 100 mg qd as prescribed without missing a dose. Will change BP medication. Instructed patient to monitor BP at home. Educated patient on potential signs of hypotension, including light-headedness, dizziness, and LOC. - Discontinue Losartan - Start Lotrel 5-20 mg capsule PO qd"  INTERIM HX: Lytes/cr stable last visit. BP unchanged so she inc her lotrel to TWO of the 5-20 tabs 4 d/a. BPs now 130/70. No side effects from lotrel.  Onset of R ear, R side of throat, R side of neck pain and swelling--yesterday. Says has chronic allerg rhinitis sx's, chronic sinus pressure. Some cough, says sounds "bronchial", slightly productive light green mucous. No SOB.  NO wheezing.  No fever.  Covid test obtained but not done yet.  ROS: no fevers, no CP, no dizziness, no HAs, no rashes, no melena/hematochezia.  No polyuria or polydipsia.  No myalgias or arthralgias.  No focal weakness, paresthesias, or tremors.  No acute vision or hearing abnormalities. No n/v/d or abd pain.  No palpitations.     Past Medical History:  Diagnosis Date  . Bilateral carpal tunnel syndrome    pt to get bilat carpal tunnel release as of 09/2018 ortho eval  . BPPV (benign paroxysmal positional  vertigo)   . Chronic pain syndrome   . Chronic renal insufficiency, stage III (moderate) (HCC)    GFR 50's  . Fibromyalgia   . GERD (gastroesophageal reflux disease)   . Heart murmur   . Hemorrhoids   . History of bronchitis    childhood, wood stove and coal heat  . History of galactorrhea 02/05/2011  . Hyperlipidemia, mixed   . Hypertension   . IFG (impaired fasting glucose) 2016   HbA1c 5.7-5.8.  Marland Kitchen Knee pain, bilateral 02/05/2011  . Lumbar spondylosis   . Overweight(278.02)   . 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 Wyatt Mage sign of recurrent dz as of this 05/2016 f/u.  Released from surveillance as of 06/03/16; return to urol prn.  . Seasonal allergic rhinitis   . Solitary left kidney 2013   Aquired: right nephrectomy for renal cancer    Past Surgical History:  Procedure Laterality Date  . CARPAL TUNNEL RELEASE Right 09/2018  . CESAREAN SECTION  1983  . COLONOSCOPY  04/2013   Normal.  Repeat 2019 (FH of [facility]  . DEXA     GYN scheduled this at 05/12/19 f/u.  Marland Kitchen ROBOT ASSISTED LAPAROSCOPIC NEPHRECTOMY  07/12/2012   Procedure: ROBOTIC ASSISTED LAPAROSCOPIC NEPHRECTOMY;  Surgeon: Alexis Frock, MD;  Location: [facility];  Service: Urology;  Laterality: Right;  . TONSILLECTOMY    . TUBAL LIGATION       Current Outpatient Medications:  .  acetaminophen (TYLENOL) 500 MG tablet, Take  1,000 mg by mouth every 8 (eight) hours as needed., Disp: , Rfl:  .  amLODipine-benazepril (LOTREL) 5-20 MG capsule, Take 1 capsule by mouth daily., Disp: 30 capsule, Rfl: 0 .  atorvastatin (LIPITOR) 80 MG tablet, Take 1 tablet (80 mg total) by mouth daily., Disp: 90 tablet, Rfl: 1 .  benzonatate (TESSALON) 100 MG capsule, Take 200 mg by mouth 3 (three) times daily as needed., Disp: , Rfl:  .  cyclobenzaprine (FLEXERIL) 10 MG tablet, TAKE 1 TABLET BY MOUTH THREE TIMES A DAY AS  NEEDED, Disp: 90 tablet, Rfl: 5 .  DULoxetine (CYMBALTA) 60 MG capsule, TAKE 1 CAPSULE BY MOUTH EVERY DAY, Disp: 90 capsule, Rfl: 3 .  gabapentin (NEURONTIN) 300 MG capsule, 2 tabs po tid, Disp: 540 capsule, Rfl: 3 .  Multiple Vitamin (MULTIVITAMIN WITH MINERALS) TABS, Take 1 tablet by mouth daily., Disp: , Rfl:  .  OVER THE COUNTER MEDICATION, Slimvance - for weight loss, Disp: , Rfl:  .  oxyCODONE (OXY IR/ROXICODONE) 5 MG immediate release tablet, 1-2 tabs po q6h prn, Disp: 90 tablet, Rfl: 0 .  pantoprazole (PROTONIX) 40 MG tablet, TAKE 1 TABLET BY MOUTH EVERY DAY, Disp: 90 tablet, Rfl: 1 .  sennosides-docusate sodium (SENOKOT-S) 8.6-50 MG tablet, Take 1 tablet by mouth 2 (two) times daily. While taking pain meds to prevent constipation, Disp: 30 tablet, Rfl: 0 .  albuterol (VENTOLIN HFA) 108 (90 Base) MCG/ACT inhaler, Inhale 2 puffs into the lungs every 6 (six) hours as needed for wheezing or shortness of breath. (Patient not taking: Reported on 06/27/2020), Disp: 1 Inhaler, Rfl: 0 .  azelastine (ASTELIN) 0.1 % nasal spray, , Disp: , Rfl:  .  diphenhydramine-acetaminophen (TYLENOL PM) 25-500 MG TABS, Take 1 tablet by mouth at bedtime as needed. (Patient not taking: Reported on 06/27/2020), Disp: , Rfl:  .  promethazine (PHENERGAN) 12.5 MG tablet, 1-2 tabs po q6h prn nausea (Patient not taking: Reported on 06/27/2020), Disp: 30 tablet, Rfl: 0 .  psyllium (METAMUCIL) 58.6 % powder, Take 1 packet by mouth as needed.  (Patient not taking: Reported on 06/27/2020), Disp: , Rfl:  .  Wheat Dextrin (BENEFIBER PO), Take by mouth.  (Patient not taking: Reported on 06/27/2020), Disp: , Rfl:   EXAM:  VITALS per patient if applicable:  GENERAL: alert, oriented, appears well and in no acute distress  HEENT: atraumatic, conjunttiva clear, no obvious abnormalities on inspection of external nose and ears  NECK: normal movements of the head and neck  LUNGS: on inspection no signs of respiratory distress,  breathing rate appears normal, no obvious gross SOB, gasping or wheezing  CV: no obvious cyanosis  MS: moves all visible extremities without noticeable abnormality  PSYCH/NEURO: pleasant and cooperative, no obvious depression or anxiety, speech and thought processing grossly intact  LABS: none today    Chemistry      Component Value Date/Time   NA 141 06/11/2020 1103   K 4.4 06/11/2020 1103   CL 106 06/11/2020 1103   CO2 25 06/11/2020 1103   BUN 16 06/11/2020 1103   CREATININE 0.96 06/11/2020 1103   CREATININE 0.98 04/22/2018 1526      Component Value Date/Time   CALCIUM 9.2 06/11/2020 1103   ALKPHOS 84 06/11/2020 1103   AST 16 06/11/2020 1103   ALT 19 06/11/2020 1103   BILITOT 0.6 06/11/2020 1103      ASSESSMENT AND PLAN:  Discussed the following assessment and plan:  1) HTN: now well controlled on TWO amlo-benaz 4-20 tabs. Will  eRx amlo-benaz 10-40 tabs, 1 qd, #90, RF x 3.  2) Acute sinusitis with PND ST, sounds like some ant cer LAD as well. She has some early acute bronchitis as well. Discussed likely viral but since not clearly well from resp standpoint the last couple weeks will treat with z-pack to see if helps, also prednisone 40mg  qd x 5d.  -we discussed possible serious and likely etiologies, options for evaluation and workup, limitations of telemedicine visit vs in person visit, treatment, treatment risks and precautions. Pt prefers to treat via telemedicine empirically rather than in person at this moment.     I discussed the assessment and treatment plan with the patient. The patient was provided an opportunity to ask questions and all were answered. The patient agreed with the plan and demonstrated an understanding of the instructions.   F/u: 3 mo RCI/chronic pain  Signed:  Crissie Sickles, MD           06/27/2020

## 2020-07-02 ENCOUNTER — Telehealth: Payer: Self-pay

## 2020-07-02 MED ORDER — BENZONATATE 200 MG PO CAPS: 200.0000 mg | ORAL_CAPSULE | Freq: Three times a day (TID) | ORAL | 0 refills | Status: DC | PRN

## 2020-07-02 MED ORDER — FLUCONAZOLE 150 MG PO TABS: 150.0000 mg | ORAL_TABLET | Freq: Once | ORAL | 0 refills | Status: AC

## 2020-07-02 NOTE — Telephone Encounter (Signed)
OK, tessalon and fluconazole eRx'd

## 2020-07-02 NOTE — Telephone Encounter (Signed)
Spoke with patient and advised meds sent.

## 2020-07-02 NOTE — Telephone Encounter (Signed)
Patient had VV on 06/27/20. Pt states she was to have Tessalon Pearls prescribed however, prescription not sent to pharmacy. Patient also requesting prescription for Diflucan d/t h/o yeast infections with antibiotic use.

## 2020-07-17 ENCOUNTER — Encounter: Payer: Self-pay | Admitting: Internal Medicine

## 2020-07-24 ENCOUNTER — Other Ambulatory Visit: Payer: Self-pay

## 2020-07-24 ENCOUNTER — Other Ambulatory Visit: Payer: Self-pay | Admitting: Family Medicine

## 2020-07-24 NOTE — Telephone Encounter (Signed)
Called [facility] [place] and confirmed refills available for atorvastatin, duloxetine, gabapentin. Advised that someone from [facility] in [place] will have to call to complete transfer. LM for pt to return call.  Requesting: oxycodone Contract: 06/11/20 UDS: 03/12/20 Last Visit:06/27/20, VV Next Visit: advised to f/u 3 mo. RCI/chronic pain Last Refill: 06/11/20(90,0)  Please Advise. Medication pending

## 2020-07-24 NOTE — Telephone Encounter (Signed)
Due to insurance change, patient would like all prescriptions changed to [facility], [address] [place], [place] and for 90 day supplies. She currently needs: Oxycodone Gabapentin Duloxetine Atorvastatin.

## 2020-07-25 ENCOUNTER — Other Ambulatory Visit: Payer: Self-pay | Admitting: Family Medicine

## 2020-07-25 MED ORDER — OXYCODONE HCL 5 MG PO TABS
ORAL_TABLET | ORAL | 0 refills | Status: DC
Start: 2020-07-25 — End: 2020-08-31

## 2020-07-25 MED ORDER — OXYCODONE HCL 5 MG PO TABS
ORAL_TABLET | ORAL | 0 refills | Status: DC
Start: 2020-07-25 — End: 2020-07-25

## 2020-07-25 MED ORDER — OXYCODONE HCL 5 MG PO TABS: ORAL_TABLET | ORAL | 0 refills | Status: DC

## 2020-08-17 ENCOUNTER — Encounter: Payer: Self-pay | Admitting: Family Medicine

## 2020-08-17 ENCOUNTER — Telehealth: Payer: 59 | Admitting: Family Medicine

## 2020-08-17 ENCOUNTER — Telehealth (INDEPENDENT_AMBULATORY_CARE_PROVIDER_SITE_OTHER): Payer: 59 | Admitting: Family Medicine

## 2020-08-17 DIAGNOSIS — H5789 Other specified disorders of eye and adnexa: Secondary | ICD-10-CM | POA: Diagnosis not present

## 2020-08-17 MED ORDER — ERYTHROMYCIN 5 MG/GM OP OINT: 1.0000 "application " | TOPICAL_OINTMENT | Freq: Three times a day (TID) | OPHTHALMIC | 0 refills | Status: DC

## 2020-08-17 NOTE — Progress Notes (Signed)
Sent links and attempted to contact patient at virtual visit time. She did attempt to log in to the [place] visit, but could not connect. I attempted to reach her numerous times by phone throughout her visit time and left her many messages. Also attempted to communicate with her via chat without answer. Left a number of messages on her voicemail that I was attempting to call her to see if she wanted to do a phone visit but was not able to connect. Contacted her PCP office front office to let them know. They let me know they will also attempt to reach her to see if phone is working and reschedule if needed.

## 2020-08-17 NOTE — Patient Instructions (Signed)
-  I sent the medication(s) we discussed to your pharmacy: Meds ordered this encounter  Medications  . erythromycin ophthalmic ointment    Sig: Place 1 application into the left eye 3 (three) times daily.    Dispense:  3.5 g    Refill:  0     I hope you are feeling better soon!  Seek in person care promptly if your symptoms worsen, new concerns arise or you are not improving with treatment over the 24 hours.  It was nice to meet you today. I help [facility] out with telemedicine visits on Tuesdays and Thursdays and am available for visits on those days. If you have any concerns or questions following this visit please schedule a follow up visit with your Primary Care doctor or seek care at a local urgent care clinic to avoid delays in care.

## 2020-08-17 NOTE — Progress Notes (Signed)
Virtual Visit via Telephone Note  I connected with [name] on 08/17/20 at  4:20 PM EST by telephone and verified that I am speaking with the correct person using two identifiers.   I discussed the limitations, risks, security and privacy concerns of performing an evaluation and management service by telephone and the availability of in person appointments. I also discussed with the patient that there may be a patient responsible charge related to this service. The patient expressed understanding and agreed to proceed.  Location patient: home, [place] Location provider: work or home office Participants present for the call: patient, provider Patient did not have a visit with me in the prior 7 days to address this/these issue(s).   History of Present Illness:  Acute telemedicine visit for ? Pink eye: -Onset: 2-3 days -Symptoms include: eyes have been a little irritated and L eye has had white drainage and is crusting, eyelid is a little irritate as well -Denies: headache, vision loss, redness or swelling of the face, fevers, malaise -she had covid 3 weeks ago    Observations/Objective: Patient sounds cheerful and well on the phone. I do not appreciate any SOB. Speech and thought processing are grossly intact. Patient reported vitals:  Assessment and Plan:  Eye irritation  -we discussed possible serious and likely etiologies, options for evaluation and workup, limitations of telemedicine visit vs in person visit, treatment, treatment risks and precautions. Pt prefers to treat via telemedicine empirically rather than in person at this moment. Query conjunctivitis vs other. Did let her know difficult to know via telephone visit. She prefers to try empiric treatment with topical abx and agrees to seek prompt in person care if worsening, new symptoms arise, or if is not improving promptlywith treatment. Advised of options for inperson care in case PCP office not available. Did let the patient  know that I only do telemedicine shifts for [facility] [place] on Tuesdays and Thursdays and advised a follow up visit with PCP or at an [facility] if has further questions or concerns.   Follow Up Instructions:  I did not refer this patient for an OV with me in the next 24 hours for this/these issue(s).  I discussed the assessment and treatment plan with the patient. The patient was provided an opportunity to ask questions and all were answered. The patient agreed with the plan and demonstrated an understanding of the instructions.   I spent 15 minutes on the date of this visit in the care of this patient. See summary of tasks completed to properly care for this patient in the detailed notes above which also included counseling of above, review of PMH, medications, allergies, evaluation of the patient and ordering and/or  instructing patient on testing and care options.     Lucretia Kern, DO

## 2020-08-31 ENCOUNTER — Other Ambulatory Visit: Payer: Self-pay

## 2020-08-31 MED ORDER — OXYCODONE HCL 5 MG PO TABS: ORAL_TABLET | ORAL | 0 refills | Status: DC

## 2020-08-31 NOTE — Telephone Encounter (Signed)
Patient refill request  oxyCODONE (OXY IR/ROXICODONE) 5 MG immediate release tablet [[id number]]    [id number] [place], [place]

## 2020-08-31 NOTE — Telephone Encounter (Signed)
Requesting:  oxyCODONE (OXY IR/ROXICODONE) 5 MG immediate release tablet Contract:06/14/20 UDS:03/12/20 Last Visit:06/27/20 Next Visit: 3 month f/u Last Refill: 07/25/20 (90,0)  Please Advise

## 2020-09-04 ENCOUNTER — Ambulatory Visit: Payer: 59 | Admitting: Internal Medicine

## 2020-09-25 ENCOUNTER — Other Ambulatory Visit: Payer: Self-pay

## 2020-09-25 ENCOUNTER — Encounter: Payer: Self-pay | Admitting: Family Medicine

## 2020-09-25 ENCOUNTER — Ambulatory Visit (INDEPENDENT_AMBULATORY_CARE_PROVIDER_SITE_OTHER): Payer: 59 | Admitting: Family Medicine

## 2020-09-25 VITALS — BP 104/60 | HR 75 | Temp 97.4°F | Resp 16 | Ht 64.5 in | Wt 195.8 lb

## 2020-09-25 DIAGNOSIS — H0015 Chalazion left lower eyelid: Secondary | ICD-10-CM | POA: Diagnosis not present

## 2020-09-25 NOTE — Progress Notes (Signed)
OFFICE VISIT  09/25/2020  CC:  Chief Complaint  Patient presents with  . Bump under eye    Saw Dr.Kim for virtual on 08/17/20 , developed pocket under left eye and wants to make sure nothing is infected.    HPI:    Patient is a 65 y.o. Caucasian female who presents for "bump under eye". Onset about 6 wks ago, grad enlarging bump on lower lid, bothers her b/c in visual field---a nuisance but not really painful.  She picks at it but it's not particularly itchy.  No drainage.    Past Medical History:  Diagnosis Date  . Bilateral carpal tunnel syndrome    pt to get bilat carpal tunnel release as of 09/2018 ortho eval  . BPPV (benign paroxysmal positional vertigo)   . Chronic pain syndrome   . Chronic renal insufficiency, stage III (moderate) (HCC)    GFR 50's  . Fibromyalgia   . GERD (gastroesophageal reflux disease)   . Heart murmur   . Hemorrhoids   . History of bronchitis    childhood, wood stove and coal heat  . History of galactorrhea 02/05/2011  . Hyperlipidemia, mixed   . Hypertension   . IFG (impaired fasting glucose) 2016   HbA1c 5.7-5.8.  Marland Kitchen Knee pain, bilateral 02/05/2011  . Lumbar spondylosis   . Overweight(278.02)   . 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 Wyatt Mage sign of recurrent dz as of this 05/2016 f/u.  Released from surveillance as of 06/03/16; return to urol prn.  . Seasonal allergic rhinitis   . Solitary left kidney 2013   Aquired: right nephrectomy for renal cancer    Past Surgical History:  Procedure Laterality Date  . CARPAL TUNNEL RELEASE Right 09/2018  . CESAREAN SECTION  1983  . COLONOSCOPY  04/2013   Normal.  Repeat 2019 (FH of [facility]CA  . DEXA     GYN scheduled this at 05/12/19 f/u.  Marland Kitchen ROBOT ASSISTED LAPAROSCOPIC NEPHRECTOMY  07/12/2012   Procedure: ROBOTIC ASSISTED LAPAROSCOPIC NEPHRECTOMY;  Surgeon: Alexis Frock, MD;   Location: [facility];  Service: Urology;  Laterality: Right;  . TONSILLECTOMY    . TUBAL LIGATION      Outpatient Medications Prior to Visit  Medication Sig Dispense Refill  . acetaminophen (TYLENOL) 500 MG tablet Take 1,000 mg by mouth every 8 (eight) hours as needed.    Marland Kitchen albuterol (VENTOLIN HFA) 108 (90 Base) MCG/ACT inhaler Inhale 2 puffs into the lungs every 6 (six) hours as needed for wheezing or shortness of breath. 1 Inhaler 0  . amLODipine-benazepril (LOTREL) 10-40 MG capsule Take 1 capsule by mouth daily. 90 capsule 3  . atorvastatin (LIPITOR) 80 MG tablet Take 1 tablet (80 mg total) by mouth daily. 90 tablet 1  . azelastine (ASTELIN) 0.1 % nasal spray     . cyclobenzaprine (FLEXERIL) 10 MG tablet TAKE 1 TABLET BY MOUTH THREE TIMES A DAY AS NEEDED 90 tablet 5  . diphenhydramine-acetaminophen (TYLENOL PM) 25-500 MG TABS Take 1 tablet by mouth at bedtime as needed.    . DULoxetine (CYMBALTA) 60 MG capsule TAKE 1 CAPSULE BY MOUTH EVERY DAY 90 capsule 3  . erythromycin ophthalmic ointment Place 1 application into the left eye 3 (three) times daily. 3.5 g 0  . gabapentin (NEURONTIN) 300 MG capsule 2 tabs po tid 540 capsule 3  . Multiple Vitamin (MULTIVITAMIN WITH MINERALS) TABS Take 1 tablet by mouth  daily.    Marland Kitchen OVER THE COUNTER MEDICATION Slimvance - for weight loss    . oxyCODONE (OXY IR/ROXICODONE) 5 MG immediate release tablet 1-2 tabs po q6h prn 90 tablet 0  . pantoprazole (PROTONIX) 40 MG tablet TAKE 1 TABLET BY MOUTH EVERY DAY 90 tablet 1  . promethazine (PHENERGAN) 12.5 MG tablet 1-2 tabs po q6h prn nausea 30 tablet 0  . psyllium (METAMUCIL) 58.6 % powder Take 1 packet by mouth as needed.    . sennosides-docusate sodium (SENOKOT-S) 8.6-50 MG tablet Take 1 tablet by mouth 2 (two) times daily. While taking pain meds to prevent constipation 30 tablet 0  . Wheat Dextrin (BENEFIBER PO) Take by mouth.    . fluconazole (DIFLUCAN) 150 MG tablet Take 150 mg by mouth once. (Patient not  taking: Reported on 09/25/2020)    . azithromycin (ZITHROMAX) 250 MG tablet 2 tabs po qd x 1d, then 1 tab po qd x 4d (Patient not taking: Reported on 09/25/2020) 6 tablet 0  . benzonatate (TESSALON) 200 MG capsule Take 1 capsule (200 mg total) by mouth 3 (three) times daily as needed for cough. (Patient not taking: Reported on 09/25/2020) 20 capsule 0  . predniSONE (DELTASONE) 20 MG tablet 2 tabs po qd x 5d (Patient not taking: Reported on 09/25/2020) 10 tablet 0   No facility-administered medications prior to visit.    Allergies  Allergen Reactions  . Codeine Nausea Only  . Dilaudid [Hydromorphone Hcl] Nausea And Vomiting    ROS As per HPI  PE: Vitals with BMI 09/25/2020 06/27/2020 06/11/2020  Height 5' 4.5" - 5' 4.5"  Weight 195 lbs 13 oz - 193 lbs 10 oz  BMI 39.7 - 67.34  Systolic 193 790 240  Diastolic 60 72 80  Pulse 75 96 77     Gen: Alert, well appearing.  Patient is oriented to person, place, time, and situation. AFFECT: pleasant, lucid thought and speech. L lower eyelid with 5 mm round subQ nodular lesion that is a bit pinkish but no erythema.  No pustule, no vesicle.  Mildly tender to touch/palpate--rubbery but not fluctuant. No bulbar or palpebral conjunctival injection.  No drainage.   EOMI, PERRL.  LABS:    Chemistry      Component Value Date/Time   NA 141 06/11/2020 1103   K 4.4 06/11/2020 1103   CL 106 06/11/2020 1103   CO2 25 06/11/2020 1103   BUN 16 06/11/2020 1103   CREATININE 0.96 06/11/2020 1103   CREATININE 0.98 04/22/2018 1526      Component Value Date/Time   CALCIUM 9.2 06/11/2020 1103   ALKPHOS 84 06/11/2020 1103   AST 16 06/11/2020 1103   ALT 19 06/11/2020 1103   BILITOT 0.6 06/11/2020 1103     IMPRESSION AND PLAN:  Chalazion of L lower eyelid.  I don't think it is infected. Recommended J&J shampoo warm compress 15 min bid.   Referred to ophthalmologist for consideration of excision.  An After Visit Summary was printed and given to the  patient.  FOLLOW UP: Return if symptoms worsen or fail to improve.  Signed:  Crissie Sickles, MD           09/25/2020

## 2020-09-25 NOTE — Patient Instructions (Signed)
Apply warm compress with baby shampoo on it for 15 min twice per day.

## 2020-10-22 ENCOUNTER — Other Ambulatory Visit: Payer: Self-pay | Admitting: Family Medicine

## 2020-10-22 ENCOUNTER — Other Ambulatory Visit: Payer: Self-pay

## 2020-10-22 ENCOUNTER — Ambulatory Visit (INDEPENDENT_AMBULATORY_CARE_PROVIDER_SITE_OTHER): Payer: 59 | Admitting: Family Medicine

## 2020-10-22 DIAGNOSIS — E7800 Pure hypercholesterolemia, unspecified: Secondary | ICD-10-CM

## 2020-10-22 DIAGNOSIS — I10 Essential (primary) hypertension: Secondary | ICD-10-CM

## 2020-10-22 DIAGNOSIS — G894 Chronic pain syndrome: Secondary | ICD-10-CM | POA: Diagnosis not present

## 2020-10-22 DIAGNOSIS — N183 Chronic kidney disease, stage 3 unspecified: Secondary | ICD-10-CM | POA: Diagnosis not present

## 2020-10-22 NOTE — Progress Notes (Signed)
OFFICE VISIT  10/22/2020  CC: routine chronic illness f/u  HPI:    Patient is a 65 y.o. Caucasian female who presents for f/u HTN, chronic pain syndrome, CRI III. A/P as of last visit: "1) HTN: now well controlled on TWO amlo-benaz 5-20 tabs. Will eRx amlo-benaz 10-40 tabs, 1 qd, #90, RF x 3.  2) Acute sinusitis with PND ST, sounds like some ant cer LAD as well. She has some early acute bronchitis as well. Discussed likely viral but since not clearly well from resp standpoint the last couple weeks will treat with z-pack to see if helps, also prednisone 40mg  qd x 5d."  INTERIM HX: Doing pretty well overall, no acute changes.  HTN: home bp consistently <130/80. CRI: hydrates well, avoids NSAIDs.  Pain level waxes/wanes, requiring anywhere from 1-4 oxycodone tabs per day.  Also taking  Indication for chronic opioid:chronic lumbar and bilat knee osteoarthritis + fibromyalgia pain that I treat with pain meds in order to maximize functioning and quality of life.  PMP AWARE reviewed today: most recent rx for oxycodone 5mg  was filled 10/12/20, # 65, rx by me. No red flags.  ROS as above, plus--> no fevers, no CP, no SOB, no wheezing, no cough, no dizziness, no HAs, no rashes, no melena/hematochezia.  No polyuria or polydipsia.   No focal weakness, paresthesias, or tremors.  No acute vision or hearing abnormalities.  No dysuria or unusual/new urinary urgency or frequency.  No recent changes in lower legs. No n/v/d or abd pain.  No palpitations.    Past Medical History:  Diagnosis Date  . Bilateral carpal tunnel syndrome    pt to get bilat carpal tunnel release as of 09/2018 ortho eval  . BPPV (benign paroxysmal positional vertigo)   . Chronic pain syndrome   . Chronic renal insufficiency, stage III (moderate) (HCC)    GFR 50's  . Fibromyalgia   . GERD (gastroesophageal reflux disease)   . Heart murmur   . Hemorrhoids   . History of bronchitis    childhood, wood stove and coal heat   . History of galactorrhea 02/05/2011  . Hyperlipidemia, mixed   . Hypertension   . IFG (impaired fasting glucose) 2016   HbA1c 5.7-5.8.  Marland Kitchen Knee pain, bilateral 02/05/2011  . Lumbar spondylosis   . Overweight(278.02)   . 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 Wyatt Mage sign of recurrent dz as of this 05/2016 f/u.  Released from surveillance as of 06/03/16; return to urol prn.  . Seasonal allergic rhinitis   . Solitary left kidney 2013   Aquired: right nephrectomy for renal cancer    Past Surgical History:  Procedure Laterality Date  . CARPAL TUNNEL RELEASE Right 09/2018  . CESAREAN SECTION  1983  . COLONOSCOPY  04/2013   Normal.  Repeat 2019 (FH of [facility]  . DEXA     GYN scheduled this at 05/12/19 f/u.  Marland Kitchen ROBOT ASSISTED LAPAROSCOPIC NEPHRECTOMY  07/12/2012   Procedure: ROBOTIC ASSISTED LAPAROSCOPIC NEPHRECTOMY;  Surgeon: Alexis Frock, MD;  Location: [facility];  Service: Urology;  Laterality: Right;  . TONSILLECTOMY    . TUBAL LIGATION      Outpatient Medications Prior to Visit  Medication Sig Dispense Refill  . acetaminophen (TYLENOL) 500 MG tablet Take 1,000 mg by mouth every 8 (eight) hours as needed.    Marland Kitchen albuterol (VENTOLIN HFA) 108 (90 Base) MCG/ACT inhaler Inhale 2 puffs  into the lungs every 6 (six) hours as needed for wheezing or shortness of breath. 1 Inhaler 0  . amLODipine-benazepril (LOTREL) 10-40 MG capsule Take 1 capsule by mouth daily. 90 capsule 3  . atorvastatin (LIPITOR) 80 MG tablet Take 1 tablet (80 mg total) by mouth daily. 90 tablet 1  . azelastine (ASTELIN) 0.1 % nasal spray     . cyclobenzaprine (FLEXERIL) 10 MG tablet TAKE 1 TABLET BY MOUTH THREE TIMES A DAY AS NEEDED 90 tablet 5  . diphenhydramine-acetaminophen (TYLENOL PM) 25-500 MG TABS Take 1 tablet by mouth at bedtime as needed.    . DULoxetine (CYMBALTA) 60 MG capsule TAKE 1 CAPSULE  BY MOUTH EVERY DAY 90 capsule 3  . erythromycin ophthalmic ointment Place 1 application into the left eye 3 (three) times daily. 3.5 g 0  . fluconazole (DIFLUCAN) 150 MG tablet Take 150 mg by mouth once. (Patient not taking: Reported on 09/25/2020)    . gabapentin (NEURONTIN) 300 MG capsule 2 tabs po tid 540 capsule 3  . Multiple Vitamin (MULTIVITAMIN WITH MINERALS) TABS Take 1 tablet by mouth daily.    Marland Kitchen OVER THE COUNTER MEDICATION Slimvance - for weight loss    . oxyCODONE (OXY IR/ROXICODONE) 5 MG immediate release tablet 1-2 tabs po q6h prn 90 tablet 0  . pantoprazole (PROTONIX) 40 MG tablet TAKE 1 TABLET BY MOUTH EVERY DAY 90 tablet 1  . promethazine (PHENERGAN) 12.5 MG tablet 1-2 tabs po q6h prn nausea 30 tablet 0  . psyllium (METAMUCIL) 58.6 % powder Take 1 packet by mouth as needed.    . sennosides-docusate sodium (SENOKOT-S) 8.6-50 MG tablet Take 1 tablet by mouth 2 (two) times daily. While taking pain meds to prevent constipation 30 tablet 0  . Wheat Dextrin (BENEFIBER PO) Take by mouth.     No facility-administered medications prior to visit.    Allergies  Allergen Reactions  . Codeine Nausea Only  . Dilaudid [Hydromorphone Hcl] Nausea And Vomiting    ROS As per HPI  PE: Vitals with BMI 09/25/2020 06/27/2020 06/11/2020  Height 5' 4.5" - 5' 4.5"  Weight 195 lbs 13 oz - 193 lbs 10 oz  BMI 40.9 - 81.19  Systolic 147 829 562  Diastolic 60 72 80  Pulse 75 96 77     Gen: Alert, well appearing.  Patient is oriented to person, place, time, and situation. AFFECT: pleasant, lucid thought and speech. CV: RRR, no m/r/g.   LUNGS: CTA bilat, nonlabored resps, good aeration in all lung fields. EXT: no clubbing or cyanosis.  No pitting edema but has mild bilat LL "doughy"-type edema.    LABS:  Lab Results  Component Value Date   TSH 0.85 06/11/2020   Lab Results  Component Value Date   WBC 4.5 06/11/2020   HGB 12.9 06/11/2020   HCT 38.3 06/11/2020   MCV 92.5 06/11/2020    PLT 232.0 06/11/2020   Lab Results  Component Value Date   CREATININE 0.96 06/11/2020   BUN 16 06/11/2020   NA 141 06/11/2020   K 4.4 06/11/2020   CL 106 06/11/2020   CO2 25 06/11/2020   Lab Results  Component Value Date   ALT 19 06/11/2020   AST 16 06/11/2020   ALKPHOS 84 06/11/2020   BILITOT 0.6 06/11/2020   Lab Results  Component Value Date   CHOL 229 (H) 06/11/2020   Lab Results  Component Value Date   HDL 67.20 06/11/2020   Lab Results  Component Value Date  [place] 70 04/22/2018   Lab Results  Component Value Date   TRIG 294.0 (H) 06/11/2020   Lab Results  Component Value Date   CHOLHDL 3 06/11/2020   Lab Results  Component Value Date   HGBA1C 5.5 04/22/2018   IMPRESSION AND PLAN:  1) HTN: stable. Cont lotrel 10-40 qd. Lytes/cr today.  2) CRI III:  Medical renal dz + solitary kidney. Avoids NSAIDs and hydrates well. Lytes/cr today.  3) Chronic pain syndrome: Stable. No new rx's given today.  An After Visit Summary was printed and given to the patient.  FOLLOW UP: Return in about 3 months (around 01/21/2021) for routine chronic illness f/u. Next cpe 05/2021  Signed:  Crissie Sickles, MD           10/22/2020

## 2020-10-23 LAB — BASIC METABOLIC PANEL
BUN/Creatinine Ratio: 14 (ref 12–28)
BUN: 14 mg/dL (ref 8–27)
CO2: 20 mmol/L (ref 20–29)
Calcium: 9.5 mg/dL (ref 8.7–10.3)
Chloride: 102 mmol/L (ref 96–106)
Creatinine, Ser: 1.02 mg/dL — ABNORMAL HIGH (ref 0.57–1.00)
Glucose: 83 mg/dL (ref 65–99)
Potassium: 4.8 mmol/L (ref 3.5–5.2)
Sodium: 141 mmol/L (ref 134–144)
eGFR: 62 mL/min/{1.73_m2} (ref >59)

## 2020-10-31 ENCOUNTER — Other Ambulatory Visit: Payer: Self-pay

## 2020-10-31 ENCOUNTER — Telehealth: Payer: Self-pay | Admitting: Family Medicine

## 2020-10-31 MED ORDER — ATORVASTATIN CALCIUM 80 MG PO TABS: 80.0000 mg | ORAL_TABLET | Freq: Every day | ORAL | 0 refills | Status: DC

## 2020-10-31 NOTE — Telephone Encounter (Signed)
Reviewed all medications pt requesting for refills and all can be transferred to new pharmacy except atorvastatin. New rx will have to be sent because out of refills. Spoke with pt regarding this, advised to contact [facility]. Pt voiced understanding.

## 2020-10-31 NOTE — Telephone Encounter (Signed)
Patient requesting refills of gabapentin, duloxetine, atorvastatin and Lotrel. Please send 90 days supplies to new pharmacy[facility] [place]

## 2020-11-08 ENCOUNTER — Other Ambulatory Visit: Payer: Self-pay | Admitting: Family Medicine

## 2020-11-08 ENCOUNTER — Other Ambulatory Visit: Payer: Self-pay

## 2020-11-08 NOTE — Telephone Encounter (Signed)
Pt did not pick up Rx from [place] and [facility]. Due to amount of Rx new Rx is required. [facility] [place].

## 2020-11-08 NOTE — Telephone Encounter (Signed)
Patient [facility] [place] [facility] [place]. [facility] states they would not call [facility] to get transferred.  Patient stating she needs this prescription now and will be waiting inside pharmacy until approved. I told her once rec'd from pharmacy it will go to Dr. Anitra Lauth for approval. I told patient I dont know how long that will be, we do ask for at least 3 business day for all refill requests.  She is aware, but states this is an emergency.  Prescription has valid # of refills, someone just needs to approve for [facility] to fill.  Latash can be reached at [phone] if you need to speak with her.  :)  Thank you

## 2020-11-08 NOTE — Telephone Encounter (Signed)
Rx sent for 90 d/s 

## 2020-11-08 NOTE — Telephone Encounter (Signed)
Pt will be due for f/u appt. Will need to schedule appt for further refills.

## 2020-11-12 ENCOUNTER — Other Ambulatory Visit: Payer: Self-pay | Admitting: Family Medicine

## 2020-11-15 ENCOUNTER — Other Ambulatory Visit: Payer: Self-pay

## 2020-11-15 ENCOUNTER — Telehealth: Payer: Self-pay | Admitting: Family Medicine

## 2020-11-15 MED ORDER — DULOXETINE HCL 60 MG PO CPEP
ORAL_CAPSULE | ORAL | 1 refills | Status: DC
Start: 2020-11-15 — End: 2021-05-13

## 2020-11-15 MED ORDER — AMLODIPINE BESY-BENAZEPRIL HCL 10-40 MG PO CAPS: 1.0000 | ORAL_CAPSULE | Freq: Every day | ORAL | 1 refills | Status: DC

## 2020-11-15 NOTE — Telephone Encounter (Signed)
Pt called and said she needs DULoxetine (CYMBALTA) 60 MG capsule sent for 90days  [facility] [address], [place], [place] [address]

## 2020-11-15 NOTE — Telephone Encounter (Signed)
Spoke with pt regarding rx refill, refills sent for duloxetine and amlodipine-combo

## 2020-11-26 ENCOUNTER — Other Ambulatory Visit: Payer: Self-pay

## 2020-11-26 MED ORDER — OXYCODONE HCL 5 MG PO TABS: ORAL_TABLET | ORAL | 0 refills | Status: DC

## 2020-11-26 NOTE — Telephone Encounter (Signed)
Requesting: oxycodone  Contract: 06/11/20 UDS:  03/12/20 Last Visit:10/22/20 Next Visit: advised to f/u 3 months  Last Refill: 08/31/20 (90,0)  Please Advise. Medication pending

## 2020-11-26 NOTE — Telephone Encounter (Signed)
Pt called and states that she needs a refill of oxyCODONE (OXY IR/ROXICODONE) 5 MG immediate release tablet [facility] [place] [facility] [address]

## 2020-11-27 NOTE — Telephone Encounter (Signed)
Left detailed message advising refill sent.

## 2021-01-07 LAB — HM MAMMOGRAPHY

## 2021-01-15 ENCOUNTER — Other Ambulatory Visit: Payer: Self-pay

## 2021-01-15 NOTE — Telephone Encounter (Signed)
Requesting:oxycodone Contract: 06/14/20 UDS:03/12/20 Last Visit:10/22/20 Next Visit: n/a Last Refill:11/26/20 (90,0)  Please Advise

## 2021-01-16 MED ORDER — OXYCODONE HCL 5 MG PO TABS: ORAL_TABLET | ORAL | 0 refills | Status: DC

## 2021-01-16 NOTE — Telephone Encounter (Signed)
#  90 eRx'd. Needs f/u chronic pain 1 mo

## 2021-01-16 NOTE — Telephone Encounter (Signed)
Pt has picked up refill; follow up scheduled 8/8

## 2021-02-03 ENCOUNTER — Other Ambulatory Visit: Payer: Self-pay | Admitting: Family Medicine

## 2021-02-15 ENCOUNTER — Encounter: Payer: Self-pay | Admitting: Family Medicine

## 2021-02-15 ENCOUNTER — Telehealth (INDEPENDENT_AMBULATORY_CARE_PROVIDER_SITE_OTHER): Payer: 59 | Admitting: Family Medicine

## 2021-02-15 ENCOUNTER — Other Ambulatory Visit: Payer: Self-pay | Admitting: Family Medicine

## 2021-02-15 VITALS — BP 126/80 | Ht 64.5 in | Wt 189.8 lb

## 2021-02-15 DIAGNOSIS — J019 Acute sinusitis, unspecified: Secondary | ICD-10-CM | POA: Diagnosis not present

## 2021-02-15 DIAGNOSIS — J209 Acute bronchitis, unspecified: Secondary | ICD-10-CM | POA: Diagnosis not present

## 2021-02-15 MED ORDER — PREDNISONE 20 MG PO TABS: ORAL_TABLET | ORAL | 0 refills | Status: DC

## 2021-02-15 MED ORDER — DOXYCYCLINE HYCLATE 100 MG PO CAPS: 100.0000 mg | ORAL_CAPSULE | Freq: Two times a day (BID) | ORAL | 0 refills | Status: AC

## 2021-02-15 NOTE — Progress Notes (Signed)
Virtual Visit via Video Note  I connected with pt on 02/15/21 at 11:30 AM EDT by a video enabled telemedicine application and verified that I am speaking with the correct person using two identifiers.  Location patient: home, [place] Location provider:work or home office Persons participating in the virtual visit: patient, provider  I discussed the limitations of evaluation and management by telemedicine and the availability of in person appointments. The patient expressed understanding and agreed to proceed.  Telemedicine visit is a necessity given the COVID-19 restrictions in place at the current time.  HPI: 65 y/o WF being seen today for respiratory complaints. Onset about 2-3 wks ago: ST, sinus pressure, nasal cong, cough and feels phlegm come up some. Some ear pains.  Sx's not improving any, had to call out of work recently. No SOB, chest tightness, or wheezing.  No fever.  Some recent diffuse achy feeling, hard to tell from her fibro symptoms.  OTC sinus/cold tabs + tylenol.  Home Covid test neg.   ROS: See pertinent positives and negatives per HPI.  Past Medical History:  Diagnosis Date   Bilateral carpal tunnel syndrome    pt to get bilat carpal tunnel release as of 09/2018 ortho eval   BPPV (benign paroxysmal positional vertigo)    Chronic pain syndrome    Chronic renal insufficiency, stage III (moderate) (HCC)    GFR 50's   Fibromyalgia    GERD (gastroesophageal reflux disease)    Heart murmur    Hemorrhoids    History of bronchitis    childhood, wood stove and coal heat   History of galactorrhea 02/05/2011   Hyperlipidemia, mixed    Hypertension    IFG (impaired fasting glucose) 2016   HbA1c 5.7-5.8.   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 Wyatt Mage sign of recurrent dz  as of this 05/2016 f/u.  Released from surveillance as of 06/03/16; return to urol prn.   Seasonal allergic rhinitis    Solitary left kidney 2013   Aquired: right nephrectomy for renal cancer    Past Surgical History:  Procedure Laterality Date   CARPAL TUNNEL RELEASE Right 09/2018   CESAREAN SECTION  1983   COLONOSCOPY  04/2013   Normal.  Repeat 2019 ([facility] of [facility]   DEXA     GYN scheduled this at 05/12/19 f/u.   ROBOT ASSISTED LAPAROSCOPIC NEPHRECTOMY  07/12/2012   Procedure: ROBOTIC ASSISTED LAPAROSCOPIC NEPHRECTOMY;  Surgeon: Alexis Frock, MD;  Location: [facility];  Service: Urology;  Laterality: Right;   TONSILLECTOMY     TUBAL LIGATION       Current Outpatient Medications:    acetaminophen (TYLENOL) 500 MG tablet, Take 1,000 mg by mouth every 8 (eight) hours as needed., Disp: , Rfl:    albuterol (VENTOLIN HFA) 108 (90 Base) MCG/ACT inhaler, Inhale 2 puffs into the lungs every 6 (six) hours as needed for wheezing or shortness of breath., Disp: 1 Inhaler, Rfl: 0   amLODipine-benazepril (LOTREL) 10-40 MG capsule, Take 1 capsule by mouth daily., Disp: 90 capsule, Rfl: 1   atorvastatin (LIPITOR) 80 MG tablet, TAKE 1 TABLET BY MOUTH EVERY DAY, Disp: 30 tablet, Rfl: 0   DULoxetine (CYMBALTA) 60 MG capsule, TAKE 1 CAPSULE BY MOUTH EVERY DAY, Disp: 90 capsule, Rfl: 1   gabapentin (NEURONTIN) 300 MG capsule, TAKE 2 CAPSULES BY  MOUTH 3 TIMES A DAY, Disp: 540 capsule, Rfl: 0   Multiple Vitamin (MULTIVITAMIN WITH MINERALS) TABS, Take 1 tablet by mouth daily., Disp: , Rfl:    OVER THE COUNTER MEDICATION, Slimvance - for weight loss, Disp: , Rfl:    oxyCODONE (OXY IR/ROXICODONE) 5 MG immediate release tablet, 1-2 tabs po q6h prn, Disp: 90 tablet, Rfl: 0   pantoprazole (PROTONIX) 40 MG tablet, TAKE 1 TABLET BY MOUTH EVERY DAY, Disp: 90 tablet, Rfl: 1   sennosides-docusate sodium (SENOKOT-S) 8.6-50 MG tablet, Take 1 tablet by mouth 2 (two) times daily. While taking pain meds to prevent constipation,  Disp: 30 tablet, Rfl: 0   Wheat Dextrin (BENEFIBER PO), Take by mouth., Disp: , Rfl:    cyclobenzaprine (FLEXERIL) 10 MG tablet, TAKE 1 TABLET BY MOUTH THREE TIMES A DAY AS NEEDED (Patient not taking: Reported on 02/15/2021), Disp: 90 tablet, Rfl: 5   diphenhydramine-acetaminophen (TYLENOL PM) 25-500 MG TABS, Take 1 tablet by mouth at bedtime as needed. (Patient not taking: Reported on 02/15/2021), Disp: , Rfl:    erythromycin ophthalmic ointment, Place 1 application into the left eye 3 (three) times daily. (Patient not taking: Reported on 02/15/2021), Disp: 3.5 g, Rfl: 0   fluconazole (DIFLUCAN) 150 MG tablet, Take 150 mg by mouth once. (Patient not taking: No sig reported), Disp: , Rfl:    promethazine (PHENERGAN) 12.5 MG tablet, 1-2 tabs po q6h prn nausea (Patient not taking: Reported on 02/15/2021), Disp: 30 tablet, Rfl: 0  EXAM:  VITALS per patient if applicable:  Vitals with BMI 02/15/2021 09/25/2020 06/27/2020  Height 5' 4.5" 5' 4.5" -  Weight 189 lbs 13 oz 195 lbs 13 oz -  BMI Q000111Q A999333 -  Systolic [id number] [id number]  Diastolic 80 60 72  Pulse - 75 96     GENERAL: alert, oriented, appears well and in no acute distress  HEENT: atraumatic, conjunttiva clear, no obvious abnormalities on inspection of external nose and ears  NECK: normal movements of the head and neck  LUNGS: on inspection no signs of respiratory distress, breathing rate appears normal, no obvious gross SOB, gasping or wheezing  CV: no obvious cyanosis  MS: moves all visible extremities without noticeable abnormality  PSYCH/NEURO: pleasant and cooperative, no obvious depression or anxiety, speech and thought processing grossly intact  LABS: none today    Chemistry      Component Value Date/Time   NA 141 10/22/2020 0129   K 4.8 10/22/2020 0129   CL 102 10/22/2020 0129   CO2 20 10/22/2020 0129   BUN 14 10/22/2020 0129   CREATININE 1.02 (H) 10/22/2020 0129   CREATININE 0.98 04/22/2018 1526      Component Value  Date/Time   CALCIUM 9.5 10/22/2020 0129   ALKPHOS 84 06/11/2020 1103   AST 16 06/11/2020 1103   ALT 19 06/11/2020 1103   BILITOT 0.6 06/11/2020 1103     Lab Results  Component Value Date   WBC 4.5 06/11/2020   HGB 12.9 06/11/2020   HCT 38.3 06/11/2020   MCV 92.5 06/11/2020   PLT 232.0 06/11/2020   Lab Results  Component Value Date   HGBA1C 5.5 04/22/2018   ASSESSMENT AND PLAN:  Discussed the following assessment and plan:  Acute sinusitis, possible acute bronchitis. Doxycycline 100 bid x 7d, prednisone '40mg'$  qd x 5d Cont otc cold meds, add saline nasal spray. Rest, hydrate.  I discussed the assessment and treatment plan with the patient. The patient was provided an opportunity to ask questions  and all were answered. The patient agreed with the plan and demonstrated an understanding of the instructions.   F/u: has appt already scheduled in 3d  Signed:  Crissie Sickles, MD           02/15/2021

## 2021-02-18 ENCOUNTER — Ambulatory Visit: Payer: 59 | Admitting: Family Medicine

## 2021-02-18 NOTE — Progress Notes (Deleted)
OFFICE VISIT  02/18/2021  CC: No chief complaint on file.  HPI:    [name] who presents for 4 mo f/u chronic pain, HTN, and CRI III. A/P as of last visit: "1) HTN: stable. Cont lotrel 10-40 qd. Lytes/cr today.   2) CRI III:  Medical renal dz + solitary kidney. Avoids NSAIDs and hydrates well. Lytes/cr today.   3) Chronic pain syndrome: Stable. No new rx's given today."  INTERIM HX: ***   Indication for chronic opioid: chronic lumbar and bilat knee osteoarthritis + fibromyalgia pain that I treat with pain meds in order to maximize functioning and quality of life. Pt not an NSAID candidate due solitary kidney status + CRI. Medication and dose: oxycodone '5mg'$  # pills per month: 90 Last UDS date: *** Opioid Treatment Agreement signed (Y/N): Y,*** Opioid Treatment Agreement last reviewed with patient:  today. PMP AWARE reviewed today: most recent rx for *** was filled ***, # ***, rx by ***. No red flags.  HTN:  CRI:   Past Medical History:  Diagnosis Date   Bilateral carpal tunnel syndrome    pt to get bilat carpal tunnel release as of 09/2018 ortho eval   BPPV (benign paroxysmal positional vertigo)    Chronic pain syndrome    Chronic renal insufficiency, stage III (moderate) (HCC)    GFR 50's   Fibromyalgia    GERD (gastroesophageal reflux disease)    Heart murmur    Hemorrhoids    History of bronchitis    childhood, wood stove and coal heat   History of galactorrhea 02/05/2011   Hyperlipidemia, mixed    Hypertension    IFG (impaired fasting glucose) 2016   HbA1c 5.7-5.8.   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 Wyatt Mage sign of recurrent dz as of this 05/2016 f/u.  Released from surveillance as of 06/03/16; return to urol prn.    Seasonal allergic rhinitis    Solitary left kidney 2013   Aquired: right nephrectomy for renal cancer    Past Surgical History:  Procedure Laterality Date   CARPAL TUNNEL RELEASE Right 09/2018   CESAREAN SECTION  1983   COLONOSCOPY  04/2013   Normal.  Repeat 2019 ([facility] of [facility]   DEXA     GYN scheduled this at 05/12/19 f/u.   ROBOT ASSISTED LAPAROSCOPIC NEPHRECTOMY  07/12/2012   Procedure: ROBOTIC ASSISTED LAPAROSCOPIC NEPHRECTOMY;  Surgeon: Alexis Frock, MD;  Location: [facility];  Service: Urology;  Laterality: Right;   TONSILLECTOMY     TUBAL LIGATION      Outpatient Medications Prior to Visit  Medication Sig Dispense Refill   acetaminophen (TYLENOL) 500 MG tablet Take 1,000 mg by mouth every 8 (eight) hours as needed.     albuterol (VENTOLIN HFA) 108 (90 Base) MCG/ACT inhaler Inhale 2 puffs into the lungs every 6 (six) hours as needed for wheezing or shortness of breath. 1 Inhaler 0   amLODipine-benazepril (LOTREL) 10-40 MG capsule Take 1 capsule by mouth daily. 90 capsule 1   atorvastatin (LIPITOR) 80 MG tablet TAKE 1 TABLET BY MOUTH EVERY DAY 30 tablet 0   cyclobenzaprine (FLEXERIL) 10 MG tablet TAKE 1 TABLET BY MOUTH THREE TIMES A DAY AS NEEDED (Patient not taking: Reported on 02/15/2021) 90 tablet 5   diphenhydramine-acetaminophen (TYLENOL PM) 25-500 MG TABS  Take 1 tablet by mouth at bedtime as needed. (Patient not taking: Reported on 02/15/2021)     doxycycline (VIBRAMYCIN) 100 MG capsule Take 1 capsule (100 mg total) by mouth 2 (two) times daily for 7 days. 14 capsule 0   DULoxetine (CYMBALTA) 60 MG capsule TAKE 1 CAPSULE BY MOUTH EVERY DAY 90 capsule 1   erythromycin ophthalmic ointment Place 1 application into the left eye 3 (three) times daily. (Patient not taking: Reported on 02/15/2021) 3.5 g 0   fluconazole (DIFLUCAN) 150 MG tablet Take 150 mg by mouth once. (Patient not taking: No sig reported)     gabapentin (NEURONTIN) 300 MG capsule TAKE 2 CAPSULES BY MOUTH 3 TIMES A DAY 540  capsule 0   Multiple Vitamin (MULTIVITAMIN WITH MINERALS) TABS Take 1 tablet by mouth daily.     OVER THE COUNTER MEDICATION Slimvance - for weight loss     oxyCODONE (OXY IR/ROXICODONE) 5 MG immediate release tablet 1-2 tabs po q6h prn 90 tablet 0   pantoprazole (PROTONIX) 40 MG tablet TAKE 1 TABLET BY MOUTH EVERY DAY 90 tablet 1   predniSONE (DELTASONE) 20 MG tablet 2 tabs po qd x 5d 10 tablet 0   promethazine (PHENERGAN) 12.5 MG tablet 1-2 tabs po q6h prn nausea (Patient not taking: Reported on 02/15/2021) 30 tablet 0   sennosides-docusate sodium (SENOKOT-S) 8.6-50 MG tablet Take 1 tablet by mouth 2 (two) times daily. While taking pain meds to prevent constipation 30 tablet 0   Wheat Dextrin (BENEFIBER PO) Take by mouth.     No facility-administered medications prior to visit.    Allergies  Allergen Reactions   Codeine Nausea Only   Dilaudid [Hydromorphone Hcl] Nausea And Vomiting    ROS As per HPI  PE: Vitals with BMI 02/15/2021 09/25/2020 06/27/2020  Height 5' 4.5" 5' 4.5" -  Weight 189 lbs 13 oz 195 lbs 13 oz -  BMI Q000111Q A999333 -  Systolic 123XX123 [id number] A999333  Diastolic 80 60 72  Pulse - 75 96     ***  LABS:  Lab Results  Component Value Date   TSH 0.85 06/11/2020   Lab Results  Component Value Date   WBC 4.5 06/11/2020   HGB 12.9 06/11/2020   HCT 38.3 06/11/2020   MCV 92.5 06/11/2020   PLT 232.0 06/11/2020   Lab Results  Component Value Date   CREATININE 1.02 (H) 10/22/2020   BUN 14 10/22/2020   NA 141 10/22/2020   K 4.8 10/22/2020   CL 102 10/22/2020   CO2 20 10/22/2020   Lab Results  Component Value Date   ALT 19 06/11/2020   AST 16 06/11/2020   ALKPHOS 84 06/11/2020   BILITOT 0.6 06/11/2020   Lab Results  Component Value Date   CHOL 229 (H) 06/11/2020   Lab Results  Component Value Date   HDL 67.20 06/11/2020   Lab Results  Component Value Date   LDLCALC 70 04/22/2018   Lab Results  Component Value Date   TRIG 294.0 (H) 06/11/2020   Lab  Results  Component Value Date   CHOLHDL 3 06/11/2020   Lab Results  Component Value Date   HGBA1C 5.5 04/22/2018   IMPRESSION AND PLAN:  No problem-specific Assessment & Plan notes found for this encounter.   An After Visit Summary was printed and given to the patient.  FOLLOW UP: No follow-ups on file.  Signed:  Crissie Sickles, MD           02/18/2021

## 2021-02-26 ENCOUNTER — Other Ambulatory Visit: Payer: Self-pay

## 2021-02-26 ENCOUNTER — Encounter: Payer: Self-pay | Admitting: Family Medicine

## 2021-02-26 ENCOUNTER — Ambulatory Visit: Payer: 59 | Admitting: Family Medicine

## 2021-02-26 VITALS — BP 106/66 | HR 82 | Temp 97.8°F | Resp 16 | Ht 64.5 in | Wt 191.4 lb

## 2021-02-26 DIAGNOSIS — G8929 Other chronic pain: Secondary | ICD-10-CM

## 2021-02-26 DIAGNOSIS — N183 Chronic kidney disease, stage 3 unspecified: Secondary | ICD-10-CM | POA: Diagnosis not present

## 2021-02-26 DIAGNOSIS — N2889 Other specified disorders of kidney and ureter: Secondary | ICD-10-CM

## 2021-02-26 DIAGNOSIS — G894 Chronic pain syndrome: Secondary | ICD-10-CM | POA: Diagnosis not present

## 2021-02-26 DIAGNOSIS — E782 Mixed hyperlipidemia: Secondary | ICD-10-CM

## 2021-02-26 DIAGNOSIS — I10 Essential (primary) hypertension: Secondary | ICD-10-CM | POA: Diagnosis not present

## 2021-02-26 DIAGNOSIS — M5442 Lumbago with sciatica, left side: Secondary | ICD-10-CM

## 2021-02-26 DIAGNOSIS — M170 Bilateral primary osteoarthritis of knee: Secondary | ICD-10-CM

## 2021-02-26 DIAGNOSIS — Z79899 Other long term (current) drug therapy: Secondary | ICD-10-CM

## 2021-02-26 LAB — COMPREHENSIVE METABOLIC PANEL
ALT: 16 U/L (ref 0–35)
AST: 12 U/L (ref 0–37)
Albumin: 4 g/dL (ref 3.5–5.2)
Alkaline Phosphatase: 83 U/L (ref 39–117)
BUN: 14 mg/dL (ref 6–23)
CO2: 25 mEq/L (ref 19–32)
Calcium: 9 mg/dL (ref 8.4–10.5)
Chloride: 103 mEq/L (ref 96–112)
Creatinine, Ser: 1.05 mg/dL (ref 0.40–1.20)
GFR: 56.68 mL/min — ABNORMAL LOW (ref >60.00)
Glucose, Bld: 91 mg/dL (ref 70–99)
Potassium: 4.6 mEq/L (ref 3.5–5.1)
Sodium: 137 mEq/L (ref 135–145)
Total Bilirubin: 0.4 mg/dL (ref 0.2–1.2)
Total Protein: 6.2 g/dL (ref 6.0–8.3)

## 2021-02-26 LAB — LIPID PANEL
Cholesterol: 240 mg/dL — ABNORMAL HIGH (ref 0–200)
HDL: 60.2 mg/dL (ref >39.00)
Total CHOL/HDL Ratio: 4
Triglycerides: 463 mg/dL — ABNORMAL HIGH (ref 0.0–149.0)

## 2021-02-26 LAB — LDL CHOLESTEROL, DIRECT: Direct LDL: 122 mg/dL

## 2021-02-26 MED ORDER — OXYCODONE HCL 5 MG PO TABS: ORAL_TABLET | ORAL | 0 refills | Status: DC

## 2021-02-26 MED ORDER — FLUCONAZOLE 150 MG PO TABS: 150.0000 mg | ORAL_TABLET | Freq: Once | ORAL | 2 refills | Status: AC

## 2021-02-26 NOTE — Progress Notes (Signed)
OFFICE VISIT  02/26/2021  CC:  Chief Complaint  Patient presents with   Follow-up    Chronic pain, pt is not fasting.     HPI:    Patient is a 65 y.o. Caucasian female who presents for 4 mo f/u HTN, chronic pain syndrome, CRI III, and mixed hyperlipidemia. A/P as of last visit: "1) HTN: stable. Cont lotrel 10-40 qd. Lytes/cr today.   2) CRI III:  Medical renal dz + solitary kidney. Avoids NSAIDs and hydrates well. Lytes/cr today.   3) Chronic pain syndrome: Stable. No new rx's given today."  INTERIM HX: Pain worse x 2d. Walking 1 mile three d/week typically.  Episode of acute sinusitis and bronchitis back earlier this month, prednisone and doxy-->MUCH BETTER NOW.  HTN: occ home bp check consistently <130/80.  CRI:  avoids NSAIDs.  Drinks water pretty well.  HLD: atorva 80 qd long term.  Hx trigs still mild/mod elev but no meds at this time.  PAIN: probs with more LBP and L sided sciatica yesterday and today.  Sitting at computer and back of neck bothers her more lately.  Typically taking 1 oxycodone tid, sometimes qid.  Taking 2 gabapentin '300mg'$  tabs qhs, makes her sleepy. Indication for chronic opioid: chronic lumbar and bilat knee osteoarthritis + fibromyalgia pain that I treat with pain meds in order to maximize functioning and quality of life PMP AWARE reviewed today: most recent rx for oxycodone '5mg'$  was filled 01/16/21, # 57, rx by me. No red flags.  ROS as above, plus--> no fevers, no CP, no SOB, no wheezing, no cough, no dizziness, no HAs, no rashes, no melena/hematochezia.  No polyuria or polydipsia.  No focal weakness, or tremors.  No acute vision or hearing abnormalities.  No dysuria or unusual/new urinary urgency or frequency.  No recent changes in lower legs. No n/v/d or abd pain.  No palpitations.    Past Medical History:  Diagnosis Date   Bilateral carpal tunnel syndrome    pt to get bilat carpal tunnel release as of 09/2018 ortho eval   BPPV (benign  paroxysmal positional vertigo)    Chronic pain syndrome    Chronic renal insufficiency, stage III (moderate) (HCC)    GFR 50's   Fibromyalgia    GERD (gastroesophageal reflux disease)    Heart murmur    Hemorrhoids    History of bronchitis    childhood, wood stove and coal heat   History of galactorrhea 02/05/2011   Hyperlipidemia, mixed    Hypertension    IFG (impaired fasting glucose) 2016   HbA1c 5.7-5.8.   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 Wyatt Mage sign of recurrent dz as of this 05/2016 f/u.  Released from surveillance as of 06/03/16; return to urol prn.   Seasonal allergic rhinitis    Solitary left kidney 2013   Aquired: right nephrectomy for renal cancer    Past Surgical History:  Procedure Laterality Date   CARPAL TUNNEL RELEASE Right 09/2018   CESAREAN SECTION  1983   COLONOSCOPY  04/2013   Normal.  Repeat 2019 ([facility] of [facility]   DEXA     GYN scheduled this at 05/12/19 f/u.   ROBOT ASSISTED LAPAROSCOPIC NEPHRECTOMY  07/12/2012   Procedure: ROBOTIC ASSISTED LAPAROSCOPIC NEPHRECTOMY;  Surgeon: Alexis Frock, MD;  Location: [facility];  Service: Urology;  Laterality: Right;   TONSILLECTOMY     TUBAL LIGATION      Outpatient Medications Prior to Visit  Medication Sig Dispense Refill   acetaminophen (TYLENOL) 500 MG tablet Take 1,000 mg by mouth every 8 (eight) hours as needed.     amLODipine-benazepril (LOTREL) 10-40 MG capsule Take 1 capsule by mouth daily. 90 capsule 1   atorvastatin (LIPITOR) 80 MG tablet TAKE 1 TABLET BY MOUTH EVERY DAY 90 tablet 0   cyclobenzaprine (FLEXERIL) 10 MG tablet TAKE 1 TABLET BY MOUTH THREE TIMES A DAY AS NEEDED 90 tablet 5   DULoxetine (CYMBALTA) 60 MG capsule TAKE 1 CAPSULE BY MOUTH EVERY DAY 90 capsule 1   gabapentin (NEURONTIN) 300 MG  capsule TAKE 2 CAPSULES BY MOUTH 3 TIMES A DAY 540 capsule 0   Multiple Vitamin (MULTIVITAMIN WITH MINERALS) TABS Take 1 tablet by mouth daily.     OVER THE COUNTER MEDICATION Slimvance - for weight loss     oxyCODONE (OXY IR/ROXICODONE) 5 MG immediate release tablet 1-2 tabs po q6h prn 90 tablet 0   pantoprazole (PROTONIX) 40 MG tablet TAKE 1 TABLET BY MOUTH EVERY DAY 90 tablet 1   sennosides-docusate sodium (SENOKOT-S) 8.6-50 MG tablet Take 1 tablet by mouth 2 (two) times daily. While taking pain meds to prevent constipation 30 tablet 0   Wheat Dextrin (BENEFIBER PO) Take by mouth.     albuterol (VENTOLIN HFA) 108 (90 Base) MCG/ACT inhaler Inhale 2 puffs into the lungs every 6 (six) hours as needed for wheezing or shortness of breath. (Patient not taking: Reported on 02/26/2021) 1 Inhaler 0   diphenhydramine-acetaminophen (TYLENOL PM) 25-500 MG TABS Take 1 tablet by mouth at bedtime as needed. (Patient not taking: Reported on 02/26/2021)     erythromycin ophthalmic ointment Place 1 application into the left eye 3 (three) times daily. (Patient not taking: No sig reported) 3.5 g 0   promethazine (PHENERGAN) 12.5 MG tablet 1-2 tabs po q6h prn nausea (Patient not taking: Reported on 02/26/2021) 30 tablet 0   fluconazole (DIFLUCAN) 150 MG tablet Take 150 mg by mouth once. (Patient not taking: No sig reported)     predniSONE (DELTASONE) 20 MG tablet 2 tabs po qd x 5d (Patient not taking: Reported on 02/26/2021) 10 tablet 0   No facility-administered medications prior to visit.    Allergies  Allergen Reactions   Codeine Nausea Only   Dilaudid [Hydromorphone Hcl] Nausea And Vomiting    ROS As per HPI  PE: Vitals with BMI 02/26/2021 02/15/2021 09/25/2020  Height 5' 4.5" 5' 4.5" 5' 4.5"  Weight 191 lbs 6 oz 189 lbs 13 oz 195 lbs 13 oz  BMI 32.36 Q000111Q A999333  Systolic A999333 [id number]  Diastolic 66 80 60  Pulse 82 - 75   Gen: Alert, well appearing.  Patient is oriented to person, place, time, and  situation. AFFECT: pleasant, lucid thought and speech. CV: RRR, no m/r/g.   LUNGS: CTA bilat, nonlabored resps, good aeration in all lung fields. EXT: no clubbing or cyanosis.  no edema.  L spine: TTP across L spine diffusely.  ROM signif limited in flexion/ext d/t spasm-like pain. Sitting SLR on R elicits generalized LBP. Sitting SLR on L elicits L glut pain and paresthesias in L ankle. Strength 5/5 prox/dist bilat legs. DTRs 1+ patellar and no achilles bilat.  LABS:    Chemistry      Component Value Date/Time   NA 141 10/22/2020 0129   K 4.8 10/22/2020 0129  CL 102 10/22/2020 0129   CO2 20 10/22/2020 0129   BUN 14 10/22/2020 0129   CREATININE 1.02 (H) 10/22/2020 0129   CREATININE 0.98 04/22/2018 1526      Component Value Date/Time   CALCIUM 9.5 10/22/2020 0129   ALKPHOS 84 06/11/2020 1103   AST 16 06/11/2020 1103   ALT 19 06/11/2020 1103   BILITOT 0.6 06/11/2020 1103     Lab Results  Component Value Date   WBC 4.5 06/11/2020   HGB 12.9 06/11/2020   HCT 38.3 06/11/2020   MCV 92.5 06/11/2020   PLT 232.0 06/11/2020   Lab Results  Component Value Date   TSH 0.85 06/11/2020   Lab Results  Component Value Date   CHOL 229 (H) 06/11/2020   HDL 67.20 06/11/2020   LDLCALC 70 04/22/2018   LDLDIRECT 128.0 06/11/2020   TRIG 294.0 (H) 06/11/2020   CHOLHDL 3 06/11/2020   Lab Results  Component Value Date   HGBA1C 5.5 04/22/2018   IMPRESSION AND PLAN:  1) Chronic pain syndrome: bilat L spine + L radiculopathy, bilat knee osteoarth, fibromyalgia. Cont oxycodone '5mg'$  tid prn, #90 per mo.  Try adding back a '300mg'$  gabapentin qd-bid in addition to her hs '600mg'$  dose.   CSC renewed. UDS today. Rx's for oxycodone #90 for this month, Sept, and Oct eRx'd today, approp fill on/after dates on rxs  2) HTN: well controlled on lotrel 10-40 qd.  3) HLD: atorva 80 qd. FLP and hepatic panel today.  Coffee with sweetened creamer this morning.  4) CRI III: avoids nsaids and  hydrates fine. Lytes/cr today.  An After Visit Summary was printed and given to the patient.  FOLLOW UP: Return in about 3 months (around 05/29/2021) for annual CPE (fasting) +RCI.  Signed:  Crissie Sickles, MD           02/26/2021

## 2021-02-27 ENCOUNTER — Other Ambulatory Visit: Payer: Self-pay

## 2021-02-27 MED ORDER — FENOFIBRATE 145 MG PO TABS: 145.0000 mg | ORAL_TABLET | Freq: Every day | ORAL | 2 refills | Status: DC

## 2021-02-27 NOTE — Progress Notes (Signed)
This med is a possibility. Lets talk about it at next f/u visit.

## 2021-03-01 LAB — DRUG MONITORING PANEL 376104, URINE
Amphetamines: NEGATIVE ng/mL (ref <500)
Barbiturates: NEGATIVE ng/mL (ref <300)
Benzodiazepines: NEGATIVE ng/mL (ref <100)
Cocaine Metabolite: NEGATIVE ng/mL (ref <150)
Codeine: NEGATIVE ng/mL (ref <50)
Desmethyltramadol: NEGATIVE ng/mL (ref <100)
Hydrocodone: NEGATIVE ng/mL (ref <50)
Hydromorphone: NEGATIVE ng/mL (ref <50)
Morphine: NEGATIVE ng/mL (ref <50)
Norhydrocodone: NEGATIVE ng/mL (ref <50)
Noroxycodone: 949 ng/mL — ABNORMAL HIGH (ref <50)
Opiates: NEGATIVE ng/mL (ref <100)
Oxycodone: 734 ng/mL — ABNORMAL HIGH (ref <50)
Oxycodone: POSITIVE ng/mL — AB (ref <100)
Oxymorphone: 581 ng/mL — ABNORMAL HIGH (ref <50)
Tramadol: NEGATIVE ng/mL (ref <100)

## 2021-03-01 LAB — DM TEMPLATE

## 2021-03-19 ENCOUNTER — Telehealth: Payer: Self-pay | Admitting: Family Medicine

## 2021-03-19 NOTE — Telephone Encounter (Signed)
Received faxed FMLA forms from [employer]. Placed in Dr. Idelle Leech front office inbox to be filled out and returned.

## 2021-03-20 NOTE — Telephone Encounter (Signed)
I won't fill these forms out until I see pt and get specific information as to why she wants them done.

## 2021-03-20 NOTE — Telephone Encounter (Signed)
Placed on PCP desk for completion

## 2021-03-21 NOTE — Telephone Encounter (Signed)
Pt scheduled for 03/28/21

## 2021-03-28 ENCOUNTER — Other Ambulatory Visit: Payer: Self-pay

## 2021-03-28 ENCOUNTER — Ambulatory Visit: Payer: 59 | Admitting: Family Medicine

## 2021-03-28 ENCOUNTER — Encounter: Payer: Self-pay | Admitting: Family Medicine

## 2021-03-28 VITALS — BP 106/65 | HR 78 | Temp 97.9°F | Ht 64.5 in | Wt 189.6 lb

## 2021-03-28 DIAGNOSIS — M797 Fibromyalgia: Secondary | ICD-10-CM

## 2021-03-28 DIAGNOSIS — G894 Chronic pain syndrome: Secondary | ICD-10-CM | POA: Diagnosis not present

## 2021-03-28 DIAGNOSIS — Z23 Encounter for immunization: Secondary | ICD-10-CM | POA: Diagnosis not present

## 2021-03-28 NOTE — Progress Notes (Signed)
OFFICE VISIT  03/28/2021  CC:  Chief Complaint  Patient presents with   Discuss FMLA paperwork    Pt brought forms from [employer] for completion last week 9/6    HPI:    Patient is a 65 y.o. Caucasian female who presents for "discuss FMLA paperwork". She has chronic lumbar pain and bilat knee osteoarthritis pain+ fibromyalgia pain that I treat with opioid and non-opioid pain meds in order to maximize functioning and quality of life. Most recent f/u for this condition was 1 month ago.  Her fibromyalgia pain, fatigue, and impact on work functioning is getting progressively worse. Often has to miss work a couple days at a time. Has FMLA paperwork for me to fill out for intermittent leave d/t fibromyalgia. Expect incapacity and need for 2d off 3 times a month approximately.   Past Medical History:  Diagnosis Date   Bilateral carpal tunnel syndrome    pt to get bilat carpal tunnel release as of 09/2018 ortho eval   BPPV (benign paroxysmal positional vertigo)    Chronic pain syndrome    Chronic renal insufficiency, stage III (moderate) (HCC)    GFR 50's   Fibromyalgia    GERD (gastroesophageal reflux disease)    Heart murmur    Hemorrhoids    History of bronchitis    childhood, wood stove and coal heat   History of galactorrhea 02/05/2011   Hyperlipidemia, mixed    Hypertension    IFG (impaired fasting glucose) 2016   HbA1c 5.7-5.8.   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 Wyatt Mage sign of recurrent dz as of this 05/2016 f/u.  Released from surveillance as of 06/03/16; return to urol prn.   Seasonal allergic rhinitis    Solitary left kidney 2013   Aquired: right nephrectomy for renal cancer    Past Surgical History:  Procedure Laterality Date   CARPAL TUNNEL RELEASE Right  09/2018   CESAREAN SECTION  1983   COLONOSCOPY  04/2013   Normal.  Repeat 2019 ([facility] of [facility]   DEXA     GYN scheduled this at 05/12/19 f/u.   ROBOT ASSISTED LAPAROSCOPIC NEPHRECTOMY  07/12/2012   Procedure: ROBOTIC ASSISTED LAPAROSCOPIC NEPHRECTOMY;  Surgeon: Alexis Frock, MD;  Location: [facility];  Service: Urology;  Laterality: Right;   TONSILLECTOMY     TUBAL LIGATION      Outpatient Medications Prior to Visit  Medication Sig Dispense Refill   acetaminophen (TYLENOL) 500 MG tablet Take 1,000 mg by mouth every 8 (eight) hours as needed.     amLODipine-benazepril (LOTREL) 10-40 MG capsule Take 1 capsule by mouth daily. 90 capsule 1   atorvastatin (LIPITOR) 80 MG tablet TAKE 1 TABLET BY MOUTH EVERY DAY 90 tablet 0   cyclobenzaprine (FLEXERIL) 10 MG tablet TAKE 1 TABLET BY MOUTH THREE TIMES A DAY AS NEEDED 90 tablet 5   DULoxetine (CYMBALTA) 60 MG capsule TAKE 1 CAPSULE BY MOUTH EVERY DAY 90 capsule 1   fenofibrate (TRICOR) 145 MG tablet Take 1 tablet (145 mg total) by mouth daily. 30 tablet 2   gabapentin (NEURONTIN) 300 MG capsule TAKE 2 CAPSULES BY MOUTH 3 TIMES A DAY 540 capsule 0   Multiple Vitamin (MULTIVITAMIN WITH MINERALS) TABS Take 1 tablet by mouth daily.     OVER THE COUNTER MEDICATION Slimvance - for weight loss  oxyCODONE (OXY IR/ROXICODONE) 5 MG immediate release tablet 1-2 tabs po q6h prn 90 tablet 0   pantoprazole (PROTONIX) 40 MG tablet TAKE 1 TABLET BY MOUTH EVERY DAY 90 tablet 1   sennosides-docusate sodium (SENOKOT-S) 8.6-50 MG tablet Take 1 tablet by mouth 2 (two) times daily. While taking pain meds to prevent constipation 30 tablet 0   albuterol (VENTOLIN HFA) 108 (90 Base) MCG/ACT inhaler Inhale 2 puffs into the lungs every 6 (six) hours as needed for wheezing or shortness of breath. (Patient not taking: No sig reported) 1 Inhaler 0   diphenhydramine-acetaminophen (TYLENOL PM) 25-500 MG TABS Take 1 tablet by mouth at bedtime as needed. (Patient not taking: No sig  reported)     erythromycin ophthalmic ointment Place 1 application into the left eye 3 (three) times daily. (Patient not taking: No sig reported) 3.5 g 0   promethazine (PHENERGAN) 12.5 MG tablet 1-2 tabs po q6h prn nausea (Patient not taking: No sig reported) 30 tablet 0   Wheat Dextrin (BENEFIBER PO) Take by mouth. (Patient not taking: Reported on 03/28/2021)     No facility-administered medications prior to visit.    Allergies  Allergen Reactions   Codeine Nausea Only   Dilaudid [Hydromorphone Hcl] Nausea And Vomiting    ROS As per HPI  PE: Vitals with BMI 03/28/2021 02/26/2021 02/15/2021  Height 5' 4.5" 5' 4.5" 5' 4.5"  Weight 189 lbs 10 oz 191 lbs 6 oz 189 lbs 13 oz  BMI 32.05 Q000111Q Q000111Q  Systolic A999333 A999333 [id number]  Diastolic 65 66 80  Pulse 78 82 -     Gen: Alert, well appearing.  Patient is oriented to person, place, time, and situation. AFFECT: pleasant, lucid thought and speech. No further exam today.  LABS:    Chemistry      Component Value Date/Time   NA 137 02/26/2021 0959   NA 141 10/22/2020 0129   K 4.6 02/26/2021 0959   CL 103 02/26/2021 0959   CO2 25 02/26/2021 0959   BUN 14 02/26/2021 0959   BUN 14 10/22/2020 0129   CREATININE 1.05 02/26/2021 0959   CREATININE 0.98 04/22/2018 1526      Component Value Date/Time   CALCIUM 9.0 02/26/2021 0959   ALKPHOS 83 02/26/2021 0959   AST 12 02/26/2021 0959   ALT 16 02/26/2021 0959   BILITOT 0.4 02/26/2021 0959      IMPRESSION AND PLAN:  Fibromyalgia syndrome, chronic pain and debility. Reviewed job duties and the impact of her symptoms on ability to do these functions. Filled out FMLA papers today stating she may need 2 d out of work up to 3 times a month for periods of acute "flare" of her condition. Continue ongoing regimen of meds: duloxetine, flexeril, oxycodone, lidocaine patches, gabapentin.  An After Visit Summary was printed and given to the patient.  FOLLOW UP: Return for keep appt set for  06/10/21.  Signed:  Crissie Sickles, MD           03/28/2021

## 2021-03-29 NOTE — Telephone Encounter (Addendum)
Shmeka called back and asked for the forms to be sent to [phone], and is asking for a copy of completed forms to be mailed to her address on file.

## 2021-03-29 NOTE — Telephone Encounter (Signed)
Forms were completed by PCP during yesterday's OV. Fax number is needed to send forms. Pt said she would call back with this information. Fax number on form is not legible. LM for pt to follow up regarding this.

## 2021-03-29 NOTE — Telephone Encounter (Signed)
Original forms faxed and sent to scan. Copy of forms will be mailed out to patient.

## 2021-05-13 ENCOUNTER — Other Ambulatory Visit: Payer: Self-pay | Admitting: Family Medicine

## 2021-05-17 ENCOUNTER — Other Ambulatory Visit: Payer: Self-pay | Admitting: Family Medicine

## 2021-05-22 ENCOUNTER — Other Ambulatory Visit: Payer: Self-pay

## 2021-05-22 ENCOUNTER — Ambulatory Visit: Payer: 59 | Admitting: Family Medicine

## 2021-05-22 ENCOUNTER — Encounter: Payer: Self-pay | Admitting: Family Medicine

## 2021-05-22 VITALS — BP 118/70 | HR 65 | Temp 97.9°F | Ht 64.5 in | Wt 190.2 lb

## 2021-05-22 DIAGNOSIS — L270 Generalized skin eruption due to drugs and medicaments taken internally: Secondary | ICD-10-CM

## 2021-05-22 DIAGNOSIS — R21 Rash and other nonspecific skin eruption: Secondary | ICD-10-CM | POA: Diagnosis not present

## 2021-05-22 NOTE — Patient Instructions (Signed)
Take otc generic zyrtec 10mg  every day. You can also still take 25mg  of benadryl every evening for excessive itching.  Call Monday if not improving significantly through the weekend.

## 2021-05-22 NOTE — Progress Notes (Signed)
OFFICE VISIT  05/22/2021  CC:  Chief Complaint  Patient presents with   Rash    Located on neck( 2 weeks) but pt reports that it is spreading to her arms and scalp(less than 3 days) She thinks it is a reaction to med(fenofibrate).  Currently experiencing itching,burning, stinging and skin hurts and is very sensitive. She stopped the medication last night    HPI:    Patient is a 65 y.o. female who presents for rash on neck.  INTERIM HX: [name] reports starting to have a itchy rash around anterior Couple of months ago, not too long after starting fenofibrate.  Then she says a couple of weeks ago it started spreading to the tops of her arms and her shoulders and face and scalp.  Very itchy.  No systemic symptoms through all of this but she did feel quite achy upon awakening this morning.  No fevers, no oral ulcers, no joint redness or swelling.  No swelling of the lips, tongue, or eyes No new over-the-counter medications.  Past Medical History:  Diagnosis Date   Bilateral carpal tunnel syndrome    pt to get bilat carpal tunnel release as of 09/2018 ortho eval   BPPV (benign paroxysmal positional vertigo)    Chronic pain syndrome    Chronic renal insufficiency, stage III (moderate) (HCC)    GFR 50's   Fibromyalgia    GERD (gastroesophageal reflux disease)    Heart murmur    Hemorrhoids    History of bronchitis    childhood, wood stove and coal heat   History of galactorrhea 02/05/2011   Hyperlipidemia, mixed    Hypertension    IFG (impaired fasting glucose) 2016   HbA1c 5.7-5.8.   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 Wyatt Mage sign of recurrent dz as of this 05/2016 f/u.  Released from surveillance as of 06/03/16; return to urol prn.   Seasonal allergic rhinitis    Solitary left  kidney 2013   Aquired: right nephrectomy for renal cancer    Past Surgical History:  Procedure Laterality Date   CARPAL TUNNEL RELEASE Right 09/2018   CESAREAN SECTION  1983   COLONOSCOPY  04/2013   Normal.  Repeat 2019 ([facility] of [facility]   DEXA     GYN scheduled this at 05/12/19 f/u.   ROBOT ASSISTED LAPAROSCOPIC NEPHRECTOMY  07/12/2012   Procedure: ROBOTIC ASSISTED LAPAROSCOPIC NEPHRECTOMY;  Surgeon: Alexis Frock, MD;  Location: [facility];  Service: Urology;  Laterality: Right;   TONSILLECTOMY     TUBAL LIGATION      Outpatient Medications Prior to Visit  Medication Sig Dispense Refill   acetaminophen (TYLENOL) 500 MG tablet Take 1,000 mg by mouth every 8 (eight) hours as needed.     amLODipine-benazepril (LOTREL) 10-40 MG capsule Take 1 capsule by mouth daily. 90 capsule 1   atorvastatin (LIPITOR) 80 MG tablet TAKE 1 TABLET BY MOUTH EVERY DAY 90 tablet 0   cyclobenzaprine (FLEXERIL) 10 MG tablet TAKE 1 TABLET BY MOUTH THREE TIMES A DAY AS NEEDED 90 tablet 5   DULoxetine (CYMBALTA) 60 MG capsule TAKE 1 CAPSULE BY MOUTH EVERY DAY 90 capsule 1   gabapentin (NEURONTIN) 300 MG capsule TAKE 2 CAPSULES BY MOUTH 3 TIMES A DAY 540 capsule 0   Multiple Vitamin (MULTIVITAMIN WITH MINERALS) TABS Take 1 tablet  by mouth daily.     OVER THE COUNTER MEDICATION Slimvance - for weight loss     oxyCODONE (OXY IR/ROXICODONE) 5 MG immediate release tablet 1-2 tabs po q6h prn 90 tablet 0   pantoprazole (PROTONIX) 40 MG tablet TAKE 1 TABLET BY MOUTH EVERY DAY 90 tablet 1   sennosides-docusate sodium (SENOKOT-S) 8.6-50 MG tablet Take 1 tablet by mouth 2 (two) times daily. While taking pain meds to prevent constipation 30 tablet 0   albuterol (VENTOLIN HFA) 108 (90 Base) MCG/ACT inhaler Inhale 2 puffs into the lungs every 6 (six) hours as needed for wheezing or shortness of breath. (Patient not taking: No sig reported) 1 Inhaler 0   diphenhydramine-acetaminophen (TYLENOL PM) 25-500 MG TABS Take 1 tablet by mouth  at bedtime as needed. (Patient not taking: No sig reported)     erythromycin ophthalmic ointment Place 1 application into the left eye 3 (three) times daily. (Patient not taking: No sig reported) 3.5 g 0   promethazine (PHENERGAN) 12.5 MG tablet 1-2 tabs po q6h prn nausea (Patient not taking: No sig reported) 30 tablet 0   Wheat Dextrin (BENEFIBER PO) Take by mouth. (Patient not taking: No sig reported)     fenofibrate (TRICOR) 145 MG tablet TAKE 1 TABLET BY MOUTH EVERY DAY (Patient not taking: Reported on 05/22/2021) 90 tablet 0   No facility-administered medications prior to visit.    Allergies  Allergen Reactions   Codeine Nausea Only   Dilaudid [Hydromorphone Hcl] Nausea And Vomiting    ROS As per HPI  PE: Vitals with BMI 05/22/2021 03/28/2021 02/26/2021  Height 5' 4.5" 5' 4.5" 5' 4.5"  Weight 190 lbs 3 oz 189 lbs 10 oz 191 lbs 6 oz  BMI 32.16 92.42 68.34  Systolic 196 222 979  Diastolic 70 65 66  Pulse 65 78 82    Exam chaperoned by Deveron Furlong, CMA.  General: Alert, well-appearing.  I do not see any rash on the scalp.  Face has scattered pinkish macules that are very small and do not blanch.  She has a pinkish splotch over her nose.  Very finely palpable maculopapular rash on anterior and lateral neck and top of chest anteriorly.  No rash on her back, chest, abdomen, or legs.  No rash on palms or soles.  No swelling of lips, tongue, or eyes.  No oral lesions.  LABS:  None today  IMPRESSION AND PLAN:  Rash, suspect allergic drug rash due to fenofibrate. She stopped fenofibrate yesterday.  I recommended she start Zyrtec 10 mg a day and she may take 25 mg dose of Benadryl at night as well.  If not improving significantly with these measures over the next 3 days then she will call or return and we will likely add prednisone at that time if appropriate.  An After Visit Summary was printed and given to the patient.  FOLLOW UP: Return for if not improving.  Signed:  Crissie Sickles, MD           05/22/2021

## 2021-05-24 ENCOUNTER — Other Ambulatory Visit: Payer: Self-pay | Admitting: Family Medicine

## 2021-05-29 ENCOUNTER — Other Ambulatory Visit: Payer: Self-pay | Admitting: Family Medicine

## 2021-05-29 ENCOUNTER — Telehealth: Payer: Self-pay

## 2021-05-29 MED ORDER — PREDNISONE 10 MG PO TABS: ORAL_TABLET | ORAL | 0 refills | Status: DC

## 2021-05-29 NOTE — Telephone Encounter (Signed)
Prednisone eRx'd. Needs in person o/v for recheck if not improved after this steroid taper.

## 2021-05-29 NOTE — Telephone Encounter (Signed)
Patient not getting any better. Patient states that Dr. Anitra Lauth told her to call back if no better and he would prescribe prednisone.    [facility]

## 2021-05-29 NOTE — Telephone Encounter (Signed)
Pt advised of med rx and f/u instructions.

## 2021-05-29 NOTE — Telephone Encounter (Signed)
LM for pt to returncall

## 2021-05-29 NOTE — Telephone Encounter (Signed)
Pt was last seen 11/9,  If not improving significantly with these measures over the next 3 days then she will call or return and we will likely add prednisone at that time if appropriate   Please review and advise

## 2021-06-10 ENCOUNTER — Encounter: Payer: 59 | Admitting: Family Medicine

## 2021-06-17 ENCOUNTER — Ambulatory Visit (INDEPENDENT_AMBULATORY_CARE_PROVIDER_SITE_OTHER): Payer: 59 | Admitting: Family Medicine

## 2021-06-17 ENCOUNTER — Encounter: Payer: Self-pay | Admitting: Family Medicine

## 2021-06-17 ENCOUNTER — Other Ambulatory Visit: Payer: Self-pay

## 2021-06-17 ENCOUNTER — Other Ambulatory Visit: Payer: Self-pay | Admitting: Family Medicine

## 2021-06-17 ENCOUNTER — Ambulatory Visit (HOSPITAL_BASED_OUTPATIENT_CLINIC_OR_DEPARTMENT_OTHER)
Admission: RE | Admit: 2021-06-17 | Discharge: 2021-06-17 | Disposition: A | Discharge status: Home / Self Care | Payer: 59 | Source: Ambulatory Visit | Attending: Family Medicine | Admitting: Family Medicine

## 2021-06-17 VITALS — BP 123/68 | HR 80 | Temp 98.0°F | Ht 62.0 in | Wt 190.6 lb

## 2021-06-17 DIAGNOSIS — I10 Essential (primary) hypertension: Secondary | ICD-10-CM

## 2021-06-17 DIAGNOSIS — Z0000 Encounter for general adult medical examination without abnormal findings: Secondary | ICD-10-CM

## 2021-06-17 DIAGNOSIS — M79609 Pain in unspecified limb: Secondary | ICD-10-CM

## 2021-06-17 DIAGNOSIS — E781 Pure hyperglyceridemia: Secondary | ICD-10-CM

## 2021-06-17 DIAGNOSIS — M79662 Pain in left lower leg: Secondary | ICD-10-CM | POA: Insufficient documentation

## 2021-06-17 DIAGNOSIS — M7989 Other specified soft tissue disorders: Secondary | ICD-10-CM | POA: Insufficient documentation

## 2021-06-17 DIAGNOSIS — E669 Obesity, unspecified: Secondary | ICD-10-CM

## 2021-06-17 DIAGNOSIS — G894 Chronic pain syndrome: Secondary | ICD-10-CM | POA: Diagnosis not present

## 2021-06-17 DIAGNOSIS — N183 Chronic kidney disease, stage 3 unspecified: Secondary | ICD-10-CM

## 2021-06-17 LAB — CBC WITH DIFFERENTIAL/PLATELET
Basophils Absolute: 0.1 10*3/uL (ref 0.0–0.1)
Basophils Relative: 1.2 % (ref 0.0–3.0)
Eosinophils Absolute: 0.2 10*3/uL (ref 0.0–0.7)
Eosinophils Relative: 4.4 % (ref 0.0–5.0)
HCT: 38.7 % (ref 36.0–46.0)
Hemoglobin: 12.7 g/dL (ref 12.0–15.0)
Lymphocytes Relative: 35.3 % (ref 12.0–46.0)
Lymphs Abs: 1.9 10*3/uL (ref 0.7–4.0)
MCHC: 32.9 g/dL (ref 30.0–36.0)
MCV: 92.6 fl (ref 78.0–100.0)
Monocytes Absolute: 0.3 10*3/uL (ref 0.1–1.0)
Monocytes Relative: 6.2 % (ref 3.0–12.0)
Neutro Abs: 2.8 10*3/uL (ref 1.4–7.7)
Neutrophils Relative %: 52.9 % (ref 43.0–77.0)
Platelets: 263 10*3/uL (ref 150.0–400.0)
RBC: 4.18 Mil/uL (ref 3.87–5.11)
RDW: 13.5 % (ref 11.5–15.5)
WBC: 5.4 10*3/uL (ref 4.0–10.5)

## 2021-06-17 LAB — COMPREHENSIVE METABOLIC PANEL
ALT: 25 U/L (ref 0–35)
AST: 19 U/L (ref 0–37)
Albumin: 4.6 g/dL (ref 3.5–5.2)
Alkaline Phosphatase: 73 U/L (ref 39–117)
BUN: 10 mg/dL (ref 6–23)
CO2: 25 mEq/L (ref 19–32)
Calcium: 9.6 mg/dL (ref 8.4–10.5)
Chloride: 103 mEq/L (ref 96–112)
Creatinine, Ser: 0.94 mg/dL (ref 0.40–1.20)
GFR: 64.59 mL/min (ref >60.00)
Glucose, Bld: 94 mg/dL (ref 70–99)
Potassium: 4.4 mEq/L (ref 3.5–5.1)
Sodium: 138 mEq/L (ref 135–145)
Total Bilirubin: 0.7 mg/dL (ref 0.2–1.2)
Total Protein: 6.9 g/dL (ref 6.0–8.3)

## 2021-06-17 LAB — LIPID PANEL
Cholesterol: 217 mg/dL — ABNORMAL HIGH (ref 0–200)
HDL: 66.5 mg/dL (ref >39.00)
NonHDL: 150
Total CHOL/HDL Ratio: 3
Triglycerides: 314 mg/dL — ABNORMAL HIGH (ref 0.0–149.0)
VLDL: 62.8 mg/dL — ABNORMAL HIGH (ref 0.0–40.0)

## 2021-06-17 LAB — LDL CHOLESTEROL, DIRECT: Direct LDL: 125 mg/dL

## 2021-06-17 LAB — HEMOGLOBIN A1C: Hgb A1c MFr Bld: 5.8 % (ref 4.6–6.5)

## 2021-06-17 LAB — TSH: TSH: 0.93 u[IU]/mL (ref 0.35–5.50)

## 2021-06-17 NOTE — Patient Instructions (Signed)

## 2021-06-17 NOTE — Progress Notes (Addendum)
Office Note 06/17/2021  CC:  Chief Complaint  Patient presents with   Annual Exam    Pt is fasting     HPI:  Patient is a 65 y.o. female who is here for annual health maintenance exam and annual health maintenance exam and f/u chronic pain syndrome, HTN, and CRI III.  1) PAIN: Overall patient feels pain is well controlled with Duloxetine 60 mg (daily), Gabapentin 300 mg (2 pills three times a day), and oxycodone 5 mg (prn). She reports a sharp pain occurring right behind her knee that radiates up the back of her thigh that started early November. She reports the pain started during a car trip to [place] in which she did not stretch. At rest, the pain is either alleviated or at a 2/10 but can become unbearable whenever she ambulates.   2) HTN: Denies changes in vision or headaches. Home BP are averaging out to be normal at 120/80. Patient continues to tolerate Lotrel.  3) CRI III: Denies increased swelling. ROS negative (cough, congestion)   Past Medical History:  Diagnosis Date   Bilateral carpal tunnel syndrome    pt to get bilat carpal tunnel release as of 09/2018 ortho eval   BPPV (benign paroxysmal positional vertigo)    Chronic pain syndrome    Chronic renal insufficiency, stage III (moderate) (HCC)    GFR 50's   Fibromyalgia    GERD (gastroesophageal reflux disease)    Heart murmur    Hemorrhoids    History of bronchitis    childhood, wood stove and coal heat   History of galactorrhea 02/05/2011   Hyperlipidemia, mixed    Hypertension    IFG (impaired fasting glucose) 2016   HbA1c 5.7-5.8.   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 Wyatt Mage sign of recurrent dz as of this 05/2016 f/u.  Released from surveillance as of 06/03/16; return to urol prn.   Seasonal allergic  rhinitis    Solitary left kidney 2013   Aquired: right nephrectomy for renal cancer    Past Surgical History:  Procedure Laterality Date   CARPAL TUNNEL RELEASE Right 09/2018   CESAREAN SECTION  1983   COLONOSCOPY  04/2013   Normal.  Repeat 2019 ([facility] of [facility]   DEXA     GYN scheduled this at 05/12/19 f/u.   ROBOT ASSISTED LAPAROSCOPIC NEPHRECTOMY  07/12/2012   Procedure: ROBOTIC ASSISTED LAPAROSCOPIC NEPHRECTOMY;  Surgeon: Alexis Frock, MD;  Location: [facility];  Service: Urology;  Laterality: Right;   TONSILLECTOMY     TUBAL LIGATION      Family History  Problem Relation Age of Onset   Thyroid disease Mother    Heart disease Mother        CHF, heart murmur, PAD   Kidney disease Mother        RAS   Other Mother 53       bowel obstruction,surgery this year   Colon cancer Father 1   Cancer Sister        cervical, breast   Thyroid disease Sister    Heart disease Brother    Thyroid disease Maternal Grandfather    Cancer Maternal Grandfather    Diabetes Paternal Grandmother    Cancer Paternal Grandmother        breast   Heart disease Paternal Grandmother  Heart disease Paternal Grandfather    Diabetes Paternal Grandfather    Hypertension Paternal Grandfather    Thyroid disease Sister    Diabetes Sister    Diabetes Sister    Hypertension Sister    Heart disease Maternal Grandmother    Thyroid disease Maternal Grandmother    Cancer Sister        kidney   Asthma Daughter    Allergies Daughter    Lung disease Son        recurrent bronchitis   Lung disease Daughter        recurrent bronchitis   Cancer Paternal Aunt        breast cancer    Social History   Socioeconomic History   Marital status: Married    Spouse name: Jerrye Beavers   Number of children: 3   Years of education: Not on file   Highest education level: Not on file  Occupational History   Not on file  Tobacco Use   Smoking status: Former    Packs/day: 2.00    Years: 5.00    Pack years: 10.00     Types: Cigarettes    Quit date: 07/14/1981    Years since quitting: 39.9   Smokeless tobacco: Never  Vaping Use   Vaping Use: Never used  Substance and Sexual Activity   Alcohol use: No   Drug use: No   Sexual activity: Yes  Other Topics Concern   Not on file  Social History Narrative   Married, 3 children.   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   No tobacco, alcohol, or drugs.  No exercise.   Social Determinants of Health   Financial Resource Strain: Not on file  Food Insecurity: Not on file  Transportation Needs: Not on file  Physical Activity: Not on file  Stress: Not on file  Social Connections: Not on file  Intimate Partner Violence: Not on file    Outpatient Medications Prior to Visit  Medication Sig Dispense Refill   acetaminophen (TYLENOL) 500 MG tablet Take 1,000 mg by mouth every 8 (eight) hours as needed.     amLODipine-benazepril (LOTREL) 10-40 MG capsule TAKE 1 CAPSULE BY MOUTH EVERY DAY 90 capsule 1   atorvastatin (LIPITOR) 80 MG tablet TAKE 1 TABLET BY MOUTH EVERY DAY 90 tablet 0   cyclobenzaprine (FLEXERIL) 10 MG tablet TAKE 1 TABLET BY MOUTH THREE TIMES A DAY AS NEEDED 90 tablet 5   DULoxetine (CYMBALTA) 60 MG capsule TAKE 1 CAPSULE BY MOUTH EVERY DAY 90 capsule 1   gabapentin (NEURONTIN) 300 MG capsule TAKE 2 CAPSULES BY MOUTH 3 TIMES A DAY 540 capsule 0   Multiple Vitamin (MULTIVITAMIN WITH MINERALS) TABS Take 1 tablet by mouth daily.     OVER THE COUNTER MEDICATION Slimvance - for weight loss     oxyCODONE (OXY IR/ROXICODONE) 5 MG immediate release tablet 1-2 tabs po q6h prn 90 tablet 0   pantoprazole (PROTONIX) 40 MG tablet TAKE 1 TABLET BY MOUTH EVERY DAY 90 tablet 1   predniSONE (DELTASONE) 10 MG tablet 3 tabs po qd x 4d, then 2 tabs po qd x 4d, then 1 tab po qd x 4d 24 tablet 0   sennosides-docusate sodium (SENOKOT-S) 8.6-50 MG tablet Take 1 tablet by mouth 2 (two) times daily. While taking pain meds to  prevent constipation 30 tablet 0   albuterol (VENTOLIN HFA) 108 (90 Base) MCG/ACT inhaler Inhale 2 puffs into the  lungs every 6 (six) hours as needed for wheezing or shortness of breath. (Patient not taking: Reported on 02/26/2021) 1 Inhaler 0   diphenhydramine-acetaminophen (TYLENOL PM) 25-500 MG TABS Take 1 tablet by mouth at bedtime as needed. (Patient not taking: Reported on 02/26/2021)     erythromycin ophthalmic ointment Place 1 application into the left eye 3 (three) times daily. (Patient not taking: Reported on 02/15/2021) 3.5 g 0   promethazine (PHENERGAN) 12.5 MG tablet 1-2 tabs po q6h prn nausea (Patient not taking: Reported on 02/26/2021) 30 tablet 0   Wheat Dextrin (BENEFIBER PO) Take by mouth. (Patient not taking: Reported on 03/28/2021)     No facility-administered medications prior to visit.    Allergies  Allergen Reactions   Fenofibrate Rash   Codeine Nausea Only   Dilaudid [Hydromorphone Hcl] Nausea And Vomiting    ROS Per HPI  PE; Vitals with BMI 06/17/2021 05/22/2021 03/28/2021  Height 5\' 2"  5' 4.5" 5' 4.5"  Weight 190 lbs 10 oz 190 lbs 3 oz 189 lbs 10 oz  BMI 34.85 67.12 45.80  Systolic 998 338 250  Diastolic 68 70 65  Pulse 80 65 78   Physical Exam Constitutional:      Appearance: Normal appearance.  Cardiovascular:     Rate and Rhythm: Normal rate and regular rhythm.     Heart sounds: Normal heart sounds.  Pulmonary:     Effort: Pulmonary effort is normal.     Breath sounds: Normal breath sounds.  Musculoskeletal:     Right knee: Normal.     Left knee: Swelling present. Tenderness present.     Comments: Swelling and tenderness to palpation present on posterior, lateral L knee. No erythema or warmness to touch.  Neurological:     Mental Status: She is alert.     Pertinent labs:  Lab Results  Component Value Date   TSH 0.85 06/11/2020   Lab Results  Component Value Date   WBC 4.5 06/11/2020   HGB 12.9 06/11/2020   HCT 38.3 06/11/2020   MCV 92.5  06/11/2020   PLT 232.0 06/11/2020   Lab Results  Component Value Date   CREATININE 1.05 02/26/2021   BUN 14 02/26/2021   NA 137 02/26/2021   K 4.6 02/26/2021   CL 103 02/26/2021   CO2 25 02/26/2021   Lab Results  Component Value Date   ALT 16 02/26/2021   AST 12 02/26/2021   ALKPHOS 83 02/26/2021   BILITOT 0.4 02/26/2021   Lab Results  Component Value Date   CHOL 240 (H) 02/26/2021   Lab Results  Component Value Date   HDL 60.20 02/26/2021   Lab Results  Component Value Date   LDLCALC 70 04/22/2018   Lab Results  Component Value Date   TRIG (H) 02/26/2021    463.0 Triglyceride is over 400; calculations on Lipids are invalid.   Lab Results  Component Value Date   CHOLHDL 4 02/26/2021   Lab Results  Component Value Date   HGBA1C 5.5 04/22/2018   ASSESSMENT AND PLAN:   Non-toxic appearing female who presents for CPE and f/u for HTN and CRI III.  1.) Pain behind knee: stable. Given patients presentation of unilateral pain, point tenderness, and increased pain on ambulation in the setting of decreased ambulation, we are suspicious for a cyst or DVT.  Will refer patient to center for Duplex US imaging. Will get D-dimer.  2.) HTN: stable 123/68. Home blood pressures have been normal. Continue Amlodipine 40mg  daily.  3. CRI III: stable. Last CMP was remarkable only for decreased GFR (56.68). Will reorder CMP, CBC, Ha1c  4. Health maintenance exam: Reviewed age and gender appropriate health maintenance issues (prudent diet, regular exercise, health risks of tobacco and excessive alcohol, use of seatbelts, fire alarms in home, use of sunscreen).  Also reviewed age and gender appropriate health screening as well as vaccine recommendations. Vaccines: ALL UTD. Lab Orders         CBC with Differential/Platelet         Comprehensive metabolic panel         Lipid panel         TSH         Hemoglobin A1c         D-dimer, quantitative     Cervical ca screening: per GYN,  Dr. Matthew Saras. Breast ca screening: per GYN, Dr. Matthew Saras. Colon ca screening: most recent colonoscopy was 2014, she is on q5y plan for FH colon ca->pt aware and will call GI for this when can.  An After Visit Summary was printed and given to the patient.  FOLLOW UP:  Return in about 3 months (around 09/15/2021) for routine chronic illness f/u.  Phil Dopp -MS3  Signed:  Crissie Sickles, MD           06/17/2021

## 2021-06-17 NOTE — Telephone Encounter (Signed)
Pt called and said she had an appt today and that she needed a refill on oxyCODONE. Please advise

## 2021-06-17 NOTE — Progress Notes (Signed)
Office Note 06/17/2021  CC:  Chief Complaint  Patient presents with   Annual Exam    Pt is fasting   HPI:  Patient is a 65 y.o. female who is here for annual health maintenance exam and annual health maintenance exam and f/u chronic pain syndrome, HTN, and CRI III.  Started fenofibrate 4 mo ago for trigs >450.  PAIN: LBP and L sided sciatica,chronic bilat knee osteoarthritis.   Typically taking 1 oxycodone tid, sometimes qid.  Taking 2 gabapentin 300mg  tabs qhs, makes her sleepy. Indication for chronic opioid: chronic LBP, fibromyalgia, and bilat knee osteoarthritis + fibromyalgia pain that I treat with pain meds in order to maximize functioning and quality of life PMP AWARE reviewed today: most recent rx for oxycodone 5mg  was filled 05/17/21, # 16, rx by me. No red flags.   Past Medical History:  Diagnosis Date   Bilateral carpal tunnel syndrome    pt to get bilat carpal tunnel release as of 09/2018 ortho eval   BPPV (benign paroxysmal positional vertigo)    Chronic pain syndrome    Chronic renal insufficiency, stage III (moderate) (HCC)    GFR 50's   Fibromyalgia    GERD (gastroesophageal reflux disease)    Heart murmur    Hemorrhoids    History of bronchitis    childhood, wood stove and coal heat   History of galactorrhea 02/05/2011   Hyperlipidemia, mixed    Hypertension    IFG (impaired fasting glucose) 2016   HbA1c 5.7-5.8.   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 Wyatt Mage sign of recurrent dz as of this 05/2016 f/u.  Released from surveillance as of 06/03/16; return to urol prn.   Seasonal allergic rhinitis    Solitary left kidney 2013   Aquired: right nephrectomy for renal cancer    Past Surgical History:  Procedure Laterality Date   CARPAL TUNNEL RELEASE Right  09/2018   CESAREAN SECTION  1983   COLONOSCOPY  04/2013   Normal.  Repeat 2019 ([facility] of [facility]   DEXA     GYN scheduled this at 05/12/19 f/u.   ROBOT ASSISTED LAPAROSCOPIC NEPHRECTOMY  07/12/2012   Procedure: ROBOTIC ASSISTED LAPAROSCOPIC NEPHRECTOMY;  Surgeon: Alexis Frock, MD;  Location: [facility];  Service: Urology;  Laterality: Right;   TONSILLECTOMY     TUBAL LIGATION      Family History  Problem Relation Age of Onset   Thyroid disease Mother    Heart disease Mother        CHF, heart murmur, PAD   Kidney disease Mother        RAS   Other Mother 85       bowel obstruction,surgery this year   Colon cancer Father 96   Cancer Sister        cervical, breast   Thyroid disease Sister    Heart disease Brother    Thyroid disease Maternal Grandfather    Cancer Maternal Grandfather    Diabetes Paternal Grandmother    Cancer Paternal Grandmother        breast   Heart disease Paternal Grandmother    Heart disease Paternal Grandfather    Diabetes Paternal Grandfather    Hypertension Paternal Grandfather    Thyroid disease Sister    Diabetes Sister    Diabetes Sister  Hypertension Sister    Heart disease Maternal Grandmother    Thyroid disease Maternal Grandmother    Cancer Sister        kidney   Asthma Daughter    Allergies Daughter    Lung disease Son        recurrent bronchitis   Lung disease Daughter        recurrent bronchitis   Cancer Paternal Aunt        breast cancer    Social History   Socioeconomic History   Marital status: Married    Spouse name: Jerrye Beavers   Number of children: 3   Years of education: Not on file   Highest education level: Not on file  Occupational History   Not on file  Tobacco Use   Smoking status: Former    Packs/day: 2.00    Years: 5.00    Pack years: 10.00    Types: Cigarettes    Quit date: 07/14/1981    Years since quitting: 39.9   Smokeless tobacco: Never  Vaping Use   Vaping Use: Never used  Substance and Sexual Activity    Alcohol use: No   Drug use: No   Sexual activity: Yes  Other Topics Concern   Not on file  Social History Narrative   Married, 3 children.   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   No tobacco, alcohol, or drugs.  No exercise.   Social Determinants of Health   Financial Resource Strain: Not on file  Food Insecurity: Not on file  Transportation Needs: Not on file  Physical Activity: Not on file  Stress: Not on file  Social Connections: Not on file  Intimate Partner Violence: Not on file    Outpatient Medications Prior to Visit  Medication Sig Dispense Refill   acetaminophen (TYLENOL) 500 MG tablet Take 1,000 mg by mouth every 8 (eight) hours as needed.     amLODipine-benazepril (LOTREL) 10-40 MG capsule TAKE 1 CAPSULE BY MOUTH EVERY DAY 90 capsule 1   atorvastatin (LIPITOR) 80 MG tablet TAKE 1 TABLET BY MOUTH EVERY DAY 90 tablet 0   cyclobenzaprine (FLEXERIL) 10 MG tablet TAKE 1 TABLET BY MOUTH THREE TIMES A DAY AS NEEDED 90 tablet 5   DULoxetine (CYMBALTA) 60 MG capsule TAKE 1 CAPSULE BY MOUTH EVERY DAY 90 capsule 1   gabapentin (NEURONTIN) 300 MG capsule TAKE 2 CAPSULES BY MOUTH 3 TIMES A DAY 540 capsule 0   Multiple Vitamin (MULTIVITAMIN WITH MINERALS) TABS Take 1 tablet by mouth daily.     OVER THE COUNTER MEDICATION Slimvance - for weight loss     oxyCODONE (OXY IR/ROXICODONE) 5 MG immediate release tablet 1-2 tabs po q6h prn 90 tablet 0   pantoprazole (PROTONIX) 40 MG tablet TAKE 1 TABLET BY MOUTH EVERY DAY 90 tablet 1   predniSONE (DELTASONE) 10 MG tablet 3 tabs po qd x 4d, then 2 tabs po qd x 4d, then 1 tab po qd x 4d 24 tablet 0   sennosides-docusate sodium (SENOKOT-S) 8.6-50 MG tablet Take 1 tablet by mouth 2 (two) times daily. While taking pain meds to prevent constipation 30 tablet 0   albuterol (VENTOLIN HFA) 108 (90 Base) MCG/ACT inhaler Inhale 2 puffs into the lungs every 6 (six) hours as needed for wheezing or  shortness of breath. (Patient not taking: Reported on 02/26/2021) 1 Inhaler 0   diphenhydramine-acetaminophen (TYLENOL PM) 25-500 MG TABS Take 1 tablet by mouth  at bedtime as needed. (Patient not taking: Reported on 02/26/2021)     erythromycin ophthalmic ointment Place 1 application into the left eye 3 (three) times daily. (Patient not taking: Reported on 02/15/2021) 3.5 g 0   promethazine (PHENERGAN) 12.5 MG tablet 1-2 tabs po q6h prn nausea (Patient not taking: Reported on 02/26/2021) 30 tablet 0   Wheat Dextrin (BENEFIBER PO) Take by mouth. (Patient not taking: Reported on 03/28/2021)     No facility-administered medications prior to visit.    Allergies  Allergen Reactions   Fenofibrate Rash   Codeine Nausea Only   Dilaudid [Hydromorphone Hcl] Nausea And Vomiting    ROS  PE; Vitals with BMI 06/17/2021 05/22/2021 03/28/2021  Height 5\' 2"  5' 4.5" 5' 4.5"  Weight 190 lbs 10 oz 190 lbs 3 oz 189 lbs 10 oz  BMI 34.85 51.88 41.66  Systolic 063 016 010  Diastolic 68 70 65  Pulse 80 65 78      Pertinent labs:  Lab Results  Component Value Date   TSH 0.85 06/11/2020   Lab Results  Component Value Date   WBC 4.5 06/11/2020   HGB 12.9 06/11/2020   HCT 38.3 06/11/2020   MCV 92.5 06/11/2020   PLT 232.0 06/11/2020   Lab Results  Component Value Date   CREATININE 1.05 02/26/2021   BUN 14 02/26/2021   NA 137 02/26/2021   K 4.6 02/26/2021   CL 103 02/26/2021   CO2 25 02/26/2021   Lab Results  Component Value Date   ALT 16 02/26/2021   AST 12 02/26/2021   ALKPHOS 83 02/26/2021   BILITOT 0.4 02/26/2021   Lab Results  Component Value Date   CHOL 240 (H) 02/26/2021   Lab Results  Component Value Date   HDL 60.20 02/26/2021   Lab Results  Component Value Date   LDLCALC 70 04/22/2018   Lab Results  Component Value Date   TRIG (H) 02/26/2021    463.0 Triglyceride is over 400; calculations on Lipids are invalid.   Lab Results  Component Value Date   CHOLHDL 4 02/26/2021    Lab Results  Component Value Date   HGBA1C 5.5 04/22/2018   ASSESSMENT AND PLAN:   No problem-specific Assessment & Plan notes found for this encounter.  Health maintenance exam: Reviewed age and gender appropriate health maintenance issues (prudent diet, regular exercise, health risks of tobacco and excessive alcohol, use of seatbelts, fire alarms in home, use of sunscreen).  Also reviewed age and gender appropriate health screening as well as vaccine recommendations. Vaccines: ALL UTD. Labs: fasting HP ordered Cervical ca screening: per GYN, Dr. Matthew Saras. Breast ca screening: per GYN, Dr. Matthew Saras. Colon ca screening: most recent colonoscopy was 2014, she is on q5y plan for FH colon ca->pt aware and will call GI for this when can.  An After Visit Summary was printed and given to the patient.  FOLLOW UP:  No follow-ups on file.  Signed:  Crissie Sickles, MD           06/17/2021

## 2021-06-18 LAB — D-DIMER, QUANTITATIVE: D-Dimer, Quant: 0.3 mcg/mL FEU (ref <0.50)

## 2021-06-18 MED ORDER — OXYCODONE HCL 5 MG PO TABS: ORAL_TABLET | ORAL | 0 refills | Status: DC

## 2021-06-18 NOTE — Telephone Encounter (Signed)
Requesting: Oxycodone Contract: 06/04/21 UDS:02/26/21 Last Visit: 06/18/21 Next Visit: advised to f/u 3 months Last Refill: 02/26/21(90,0) fill on/after 04/26/21  Please Advise. Med pending

## 2021-06-18 NOTE — Telephone Encounter (Signed)
Pt advised refill sent. °

## 2021-08-02 ENCOUNTER — Encounter: Payer: Self-pay | Admitting: Internal Medicine

## 2021-08-02 ENCOUNTER — Other Ambulatory Visit: Payer: Self-pay

## 2021-08-02 ENCOUNTER — Ambulatory Visit: Payer: 59 | Admitting: Internal Medicine

## 2021-08-02 VITALS — BP 120/70 | HR 91 | Ht 62.25 in | Wt 190.0 lb

## 2021-08-02 DIAGNOSIS — K5909 Other constipation: Secondary | ICD-10-CM

## 2021-08-02 DIAGNOSIS — R1032 Left lower quadrant pain: Secondary | ICD-10-CM

## 2021-08-02 DIAGNOSIS — K2100 Gastro-esophageal reflux disease with esophagitis, without bleeding: Secondary | ICD-10-CM

## 2021-08-02 DIAGNOSIS — R32 Unspecified urinary incontinence: Secondary | ICD-10-CM | POA: Diagnosis not present

## 2021-08-02 DIAGNOSIS — Z800 Family history of malignant neoplasm of digestive organs: Secondary | ICD-10-CM

## 2021-08-02 DIAGNOSIS — M797 Fibromyalgia: Secondary | ICD-10-CM

## 2021-08-02 MED ORDER — OXYCODONE HCL 5 MG PO TABS: ORAL_TABLET | ORAL | 0 refills | Status: DC

## 2021-08-02 MED ORDER — PANTOPRAZOLE SODIUM 40 MG PO TBEC: 40.0000 mg | DELAYED_RELEASE_TABLET | Freq: Every day | ORAL | 3 refills | Status: DC

## 2021-08-02 NOTE — Patient Instructions (Addendum)
You have been scheduled for a colonoscopy. Please follow written instructions given to you at your visit today.  Please pick up your prep supplies at the pharmacy within the next 1-3 days. If you use inhalers (even only as needed), please bring them with you on the day of your procedure.  Purchase a wedge pillow to replace pillow stacking. Amazon has them or check [employer] supply store.  We are giving you GERD information to read and follow.  Take your pantoprazole 30 minutes prior to supper.   I appreciate the opportunity to care for you. Silvano Rusk, MD, [facility]

## 2021-08-02 NOTE — Progress Notes (Signed)
[name] 66 y.o. [birth year] [id number]  Assessment & Plan:   Encounter Diagnoses  Name Primary?   LLQ pain Yes   Chronic constipation    Gastroesophageal reflux disease with esophagitis, unspecified whether hemorrhage    Urinary incontinence, unspecified type    Family history of colon cancer-father 33    Fibromyalgia    Schedule colonoscopy given the family history of colon cancer and overdue for screening follow-up, and her abdominal pain and constipation issues.  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  Would also consider adding fiber supplementation plus or minus increasing in diet  GERD lifestyle recommendations, trying to add more time between eating and lying down.  Purchase a medical wedge rather than using pillows.  Move pantoprazole dosing from bedtime to before supper.  Reduce refined sugar consumption.  The risks and benefits as well as alternatives of endoscopic procedure(s) have been discussed and reviewed. All questions answered. The patient agrees to proceed.   CC: McGowen, Adrian Blackwater, MD   Subjective:   Chief Complaint: Left lower quadrant pain and constipation problems  HPI 66 year old white woman with fibromyalgia, family history of colon cancer, personal history of renal cell carcinoma here with complaints of abdominal pain and constipation.  Her father had colon cancer at 30, her last colonoscopy was in 2014 and was negative, there was a suspected polyp but it was benign mucosa.  She has not had a follow-up exam.  She reports chronic straining to defecate and often gets an urge to defecate without able to produce a stool.  She has been using a stool softener somewhat with plus minus benefits.  Sometimes she will take Flexeril and Tylenol PM for fibromyalgia and recently use that for headache, she also will sometimes use oxycodone for  fibromyalgia pain.  When she uses these medications her constipation is worse.  Over the past couple of months she has been having intermittent left lower quadrant and some other abdominal pains in the umbilical area and upper abdomen.  Some of these been fairly intense and she is concerned about them.  They tend to abate with defecation.  No bleeding reported.  She has heartburn issues she takes pantoprazole at bedtime.  She eats and goes to bed within a few hours, sometimes less than 3 and she tends to have problems with heartburn and indigestion about 3 times a week at night.  She does have 2 pillows "propped up".  She drinks a cup of coffee daily at about 2 to 3 glasses of tea with "a little sugar" in them.  She does not have significant soda or fruit juice consumption.  Does not use tobacco.  December labs CMET and CBC were normal Allergies  Allergen Reactions   Fenofibrate Rash   Codeine Nausea Only   Dilaudid [Hydromorphone Hcl] Nausea And Vomiting   Current Meds  Medication Sig   acetaminophen (TYLENOL) 500 MG tablet Take 1,000 mg by mouth every 8 (eight) hours as needed.   albuterol (VENTOLIN HFA) 108 (90 Base) MCG/ACT inhaler Inhale 2 puffs into the lungs every 6 (six) hours as needed for wheezing or shortness of breath.   amLODipine-benazepril (LOTREL) 10-40 MG capsule TAKE 1 CAPSULE BY MOUTH EVERY DAY   atorvastatin (LIPITOR) 80 MG tablet TAKE 1 TABLET BY MOUTH EVERY DAY   cyclobenzaprine (FLEXERIL) 10 MG tablet TAKE 1 TABLET BY MOUTH THREE TIMES A DAY AS  NEEDED   diphenhydramine-acetaminophen (TYLENOL PM) 25-500 MG TABS Take 1 tablet by mouth at bedtime as needed.   DULoxetine (CYMBALTA) 60 MG capsule TAKE 1 CAPSULE BY MOUTH EVERY DAY   erythromycin ophthalmic ointment Place 1 application into the left eye 3 (three) times daily.   gabapentin (NEURONTIN) 300 MG capsule TAKE 2 CAPSULES BY MOUTH 3 TIMES A DAY   Multiple Vitamin (MULTIVITAMIN WITH MINERALS) TABS Take 1 tablet by  mouth daily.   OVER THE COUNTER MEDICATION Slimvance - for weight loss   promethazine (PHENERGAN) 12.5 MG tablet 1-2 tabs po q6h prn nausea   sennosides-docusate sodium (SENOKOT-S) 8.6-50 MG tablet Take 1 tablet by mouth 2 (two) times daily. While taking pain meds to prevent constipation   Wheat Dextrin (BENEFIBER PO) Take by mouth.   [DISCONTINUED] oxyCODONE (OXY IR/ROXICODONE) 5 MG immediate release tablet 1-2 tabs po q6h prn   [DISCONTINUED] pantoprazole (PROTONIX) 40 MG tablet TAKE 1 TABLET BY MOUTH EVERY DAY (Patient taking differently: Take 30 minutes prior to supper)   Past Medical History:  Diagnosis Date   Bilateral carpal tunnel syndrome    pt to get bilat carpal tunnel release as of 09/2018 ortho eval   BPPV (benign paroxysmal positional vertigo)    Chronic pain syndrome    Chronic renal insufficiency, stage III (moderate) (HCC)    GFR 50's   Fibromyalgia    GERD (gastroesophageal reflux disease)    Heart murmur    Hemorrhoids    History of bronchitis    childhood, wood stove and coal heat   History of galactorrhea 02/05/2011   Hyperlipidemia, mixed    Hypertension    IFG (impaired fasting glucose) 2016   HbA1c 5.7-5.8.   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 Wyatt Mage sign of recurrent dz as of this 05/2016 f/u.  Released from surveillance as of 06/03/16; return to urol prn.   Seasonal allergic rhinitis    Solitary left kidney 2013   Aquired: right nephrectomy for renal cancer   Past Surgical History:  Procedure Laterality Date   CARPAL TUNNEL RELEASE Right 09/2018   CESAREAN SECTION  1983   COLONOSCOPY  04/2013   Normal.  Repeat 2019 ([facility] of [facility]   DEXA     GYN scheduled this at 05/12/19 f/u.   ROBOT ASSISTED LAPAROSCOPIC NEPHRECTOMY  07/12/2012   Procedure: ROBOTIC  ASSISTED LAPAROSCOPIC NEPHRECTOMY;  Surgeon: Alexis Frock, MD;  Location: [facility];  Service: Urology;  Laterality: Right;   TONSILLECTOMY     TUBAL LIGATION     Social History   Social History Narrative   Married, 3 children.   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   No tobacco, alcohol, or drugs.  No exercise.   family history includes Allergies in her daughter; Asthma in her daughter; Cancer in her maternal grandfather, paternal aunt, paternal grandmother, sister, and sister; Colon cancer (age of onset: 79) in her father; Diabetes in her paternal grandfather, paternal grandmother, sister, and sister; Heart disease in her brother, maternal grandmother, mother, paternal grandfather, and paternal grandmother; Hypertension in her paternal grandfather and sister; Kidney disease in her mother; Lung disease in her daughter and son; Other (age of onset: 19)  in her mother; Thyroid disease in her maternal grandfather, maternal grandmother, mother, sister, and sister.   Review of Systems   Objective:   Physical Exam @BP  120/70    Pulse 91    Ht 5' 2.25" (1.581 m)    Wt 190 lb (86.2 kg)    BMI 34.47 kg/m @  General:  Well-developed, well-nourished and in no acute distress Eyes:  anicteric. ENT:   Mouth and posterior pharynx free of lesions.  Neck:   supple w/o thyromegaly or mass.  Lungs: Clear to auscultation bilaterally. Heart:  S1S2, no rubs, murmurs, gallops. Abdomen:  soft, non-tender, no hepatosplenomegaly, hernia, or mass and BS+.  Rectal:  Julieanne Cotton CMA presnt   Anoderm inspection revealed small ext hemorrhoids Anal wink was + Digital exam revealed normal to increased resting tone. voluntary squeeze that involved other skeletal mm recruitment No mass or rectocele present. Simulated defecation with valsalva revealed appropriate abdominal  contraction, paradoxical anal contraction and reduced descent    Lymph:  no cervical or supraclavicular adenopathy. Extremities:   no edema, cyanosis or clubbing Skin   no rash. Neuro:  A&O x 3.  Psych:  appropriate mood and  Affect.   Data Reviewed: See HPI

## 2021-08-02 NOTE — Telephone Encounter (Signed)
Patient refill request.  [facility]  [place]   oxyCODONE (OXY IR/ROXICODONE) 5 MG immediate release tablet [[id number]]   New prescription needed for the following:  pantoprazole (PROTONIX) 40 MG tablet [[id number]]

## 2021-08-02 NOTE — Telephone Encounter (Signed)
Requesting: oxycodone Contract: 06/04/21 UDS: 02/26/21 Last Visit: 06/17/21 Next Visit: advised to f/u March Last Refill: 06/18/21(90,0)  RF request for pantoprazole LOV: 06/17/21 Next ov: advised to f/u March Last written: 01/02/20(90,1)

## 2021-08-06 NOTE — Telephone Encounter (Signed)
Pt advised refills sent. °

## 2021-09-16 ENCOUNTER — Other Ambulatory Visit: Payer: Self-pay

## 2021-09-16 ENCOUNTER — Ambulatory Visit: Payer: 59 | Admitting: Family Medicine

## 2021-09-16 ENCOUNTER — Encounter: Payer: Self-pay | Admitting: Family Medicine

## 2021-09-16 VITALS — BP 109/67 | HR 75 | Temp 97.5°F | Ht 62.25 in | Wt 191.4 lb

## 2021-09-16 DIAGNOSIS — I10 Essential (primary) hypertension: Secondary | ICD-10-CM | POA: Diagnosis not present

## 2021-09-16 DIAGNOSIS — Z905 Acquired absence of kidney: Secondary | ICD-10-CM | POA: Diagnosis not present

## 2021-09-16 DIAGNOSIS — N183 Chronic kidney disease, stage 3 unspecified: Secondary | ICD-10-CM | POA: Diagnosis not present

## 2021-09-16 DIAGNOSIS — G894 Chronic pain syndrome: Secondary | ICD-10-CM | POA: Diagnosis not present

## 2021-09-16 DIAGNOSIS — M797 Fibromyalgia: Secondary | ICD-10-CM

## 2021-09-16 LAB — BASIC METABOLIC PANEL
BUN: 16 mg/dL (ref 6–23)
CO2: 25 mEq/L (ref 19–32)
Calcium: 9.2 mg/dL (ref 8.4–10.5)
Chloride: 104 mEq/L (ref 96–112)
Creatinine, Ser: 1.01 mg/dL (ref 0.40–1.20)
GFR: 59.15 mL/min — ABNORMAL LOW (ref >60.00)
Glucose, Bld: 94 mg/dL (ref 70–99)
Potassium: 4.4 mEq/L (ref 3.5–5.1)
Sodium: 141 mEq/L (ref 135–145)

## 2021-09-16 MED ORDER — ATORVASTATIN CALCIUM 80 MG PO TABS: 80.0000 mg | ORAL_TABLET | Freq: Every day | ORAL | 1 refills | Status: DC

## 2021-09-16 MED ORDER — DULOXETINE HCL 60 MG PO CPEP: 60.0000 mg | ORAL_CAPSULE | Freq: Every day | ORAL | 1 refills | Status: DC

## 2021-09-16 MED ORDER — CYCLOBENZAPRINE HCL 10 MG PO TABS: ORAL_TABLET | ORAL | 5 refills | Status: DC

## 2021-09-16 MED ORDER — OXYCODONE HCL 5 MG PO TABS: ORAL_TABLET | ORAL | 0 refills | Status: DC

## 2021-09-16 MED ORDER — AMLODIPINE BESY-BENAZEPRIL HCL 10-40 MG PO CAPS: ORAL_CAPSULE | ORAL | 1 refills | Status: DC

## 2021-09-16 NOTE — Progress Notes (Signed)
OFFICE VISIT ? ?09/16/2021 ? ?CC:  ?Chief Complaint  ?Patient presents with  ? Pain  ?  Chronic  ? Hypertension  ?  Pt is not fasting  ? Chronic Kidney Disease  ? ? ?HPI:   ? ?Patient is a 66 y.o. female who presents for 1-monthfollow-up chronic pain syndrome, chronic renal insufficiency, and hypertension. ?A/P as of last visit: ?"1.) Pain behind knee: stable. Given patients presentation of unilateral pain, point tenderness, and increased pain on ambulation in the setting of decreased ambulation, we are suspicious for a cyst or DVT.  Will refer patient to center for Duplex [facility]. Will get D-dimer. ?  ?2.) HTN: stable 123/68. Home blood pressures have been normal. Continue Amlodipine-benaz 10- 41mdaily. ?  ?3. CRI III: stable. Last CMP was remarkable only for decreased GFR (56.68). Will reorder CMP, CBC, Ha1c ?  ?4. Health maintenance exam: ?Reviewed age and gender appropriate health maintenance issues (prudent diet, regular exercise, health risks of tobacco and excessive alcohol, use of seatbelts, fire alarms in home, use of sunscreen).  Also reviewed age and gender appropriate health screening as well as vaccine recommendations. ?Vaccines: ALL UTD. ?Lab Orders    ?     CBC with Differential/Platelet    ?     Comprehensive metabolic panel    ?     Lipid panel    ?     TSH    ?     Hemoglobin A1c    ?     D-dimer, quantitative     ?Cervical ca screening: per GYN, Dr. HoMatthew Saras?Breast ca screening: per GYN, Dr. HoMatthew Saras?Colon ca screening: most recent colonoscopy was 2014, she is on q5y plan for FH colon ca->pt aware and will call GI for this when can." ? ?INTERIM HX: ?Last visit her labs were all good except triglycerides 314, LDL-C 125. ?No med changes made. ? ?She looks forward to retiring soon. ?Hopefully this will allow her to do consistent level of exercise--too much sitting exacerbates her fibromyalgia, as does too much walking.  Has joined a gym. ? ?No home blood pressure monitoring ? ?Chronic  widespread pain involving neck, shoulders, arms, knees primarily. ?Indication for chronic opioid: chronic lumbar and bilat knee osteoarthritis + fibromyalgia pain that I treat with pain meds in order to maximize functioning and quality of life. ?She is also taking cymbalta 6031md and gabapentin 600 mg nightly usually.  Also Flexeril pretty regularly. ? ?PMP AWARE reviewed today: most recent rx for oxycodone was filled 08/02/21, # 90,14x by me. ?No red flags. ? ? ?Past Medical History:  ?Diagnosis Date  ? Bilateral carpal tunnel syndrome   ? pt to get bilat carpal tunnel release as of 09/2018 ortho eval  ? BPPV (benign paroxysmal positional vertigo)   ? Chronic pain syndrome   ? Chronic renal insufficiency, stage III (moderate) (HCC)   ? GFR 50's  ? Fibromyalgia   ? GERD (gastroesophageal reflux disease)   ? Heart murmur   ? Hemorrhoids   ? History of bronchitis   ? childhood, wood stove and coal heat  ? History of galactorrhea 02/05/2011  ? Hyperlipidemia, mixed   ? Hypertension   ? IFG (impaired fasting glucose) 2016  ? HbA1c 5.7-5.8.  ? Knee pain, bilateral 02/05/2011  ? Lumbar spondylosis   ? Overweight(278.02)   ? Renal mass, right 05/25/2012  ? 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  Manny)-no sign of recurrent dz as of this 05/2016 f/u.  Released from surveillance as of 06/03/16; return to urol prn.  ? Seasonal allergic rhinitis   ? Solitary left kidney 2013  ? Aquired: right nephrectomy for renal cancer  ? ? ?Past Surgical History:  ?Procedure Laterality Date  ? CARPAL TUNNEL RELEASE Right 09/2018  ? [place]  ? COLONOSCOPY  04/2013  ? Normal.  Repeat 2019 ([facility] of [facility]  ? DEXA    ? GYN scheduled this at 05/12/19 f/u.  ? ROBOT ASSISTED LAPAROSCOPIC NEPHRECTOMY  07/12/2012  ? Procedure: ROBOTIC ASSISTED LAPAROSCOPIC NEPHRECTOMY;  Surgeon: Alexis Frock, MD;  Location: [facility];  Service: Urology;  Laterality:  Right;  ? TONSILLECTOMY    ? TUBAL LIGATION    ? ? ?Outpatient Medications Prior to Visit  ?Medication Sig Dispense Refill  ? acetaminophen (TYLENOL) 500 MG tablet Take 1,000 mg by mouth every 8 (eight) hours as needed.    ? albuterol (VENTOLIN HFA) 108 (90 Base) MCG/ACT inhaler Inhale 2 puffs into the lungs every 6 (six) hours as needed for wheezing or shortness of breath. 1 Inhaler 0  ? diphenhydramine-acetaminophen (TYLENOL PM) 25-500 MG TABS Take 1 tablet by mouth at bedtime as needed.    ? erythromycin ophthalmic ointment Place 1 application into the left eye 3 (three) times daily. 3.5 g 0  ? gabapentin (NEURONTIN) 300 MG capsule TAKE 2 CAPSULES BY MOUTH 3 TIMES A DAY 540 capsule 0  ? Multiple Vitamin (MULTIVITAMIN WITH MINERALS) TABS Take 1 tablet by mouth daily.    ? OVER THE COUNTER MEDICATION Slimvance - for weight loss    ? pantoprazole (PROTONIX) 40 MG tablet Take 1 tablet (40 mg total) by mouth daily. 90 tablet 3  ? promethazine (PHENERGAN) 12.5 MG tablet 1-2 tabs po q6h prn nausea 30 tablet 0  ? sennosides-docusate sodium (SENOKOT-S) 8.6-50 MG tablet Take 1 tablet by mouth 2 (two) times daily. While taking pain meds to prevent constipation 30 tablet 0  ? Wheat Dextrin (BENEFIBER PO) Take by mouth.    ? amLODipine-benazepril (LOTREL) 10-40 MG capsule TAKE 1 CAPSULE BY MOUTH EVERY DAY 90 capsule 1  ? atorvastatin (LIPITOR) 80 MG tablet TAKE 1 TABLET BY MOUTH EVERY DAY 90 tablet 0  ? cyclobenzaprine (FLEXERIL) 10 MG tablet TAKE 1 TABLET BY MOUTH THREE TIMES A DAY AS NEEDED 90 tablet 5  ? DULoxetine (CYMBALTA) 60 MG capsule TAKE 1 CAPSULE BY MOUTH EVERY DAY 90 capsule 1  ? oxyCODONE (OXY IR/ROXICODONE) 5 MG immediate release tablet 1-2 tabs po q6h prn 90 tablet 0  ? ?No facility-administered medications prior to visit.  ? ? ?Allergies  ?Allergen Reactions  ? Fenofibrate Rash  ? Codeine Nausea Only  ? Dilaudid [Hydromorphone Hcl] Nausea And Vomiting  ? ? ?ROS ?As per HPI ? ?PE: ?Vitals with BMI 09/16/2021  08/02/2021 06/17/2021  ?Height 5' 2.25" 5' 2.25" '5\' 2"'   ?Weight 191 lbs 6 oz 190 lbs 190 lbs 10 oz  ?BMI 34.73 34.48 34.85  ?Systolic 782 956 213  ?Diastolic 67 70 68  ?Pulse 75 91 80  ? ? ? ?Physical Exam ? ?Gen: Alert, well appearing.  Patient is oriented to person, place, time, and situation. ?AFFECT: pleasant, lucid thought and speech. ?CV: RRR, no m/r/g.   ?LUNGS: CTA bilat, nonlabored resps, good aeration in all lung fields. ? ? ?LABS:  ?Last CBC ?Lab Results  ?Component Value Date  ? WBC 5.4 06/17/2021  ? HGB 12.7 06/17/2021  ?  HCT 38.7 06/17/2021  ? MCV 92.6 06/17/2021  ? MCH 31.9 04/22/2018  ? RDW 13.5 06/17/2021  ? PLT 263.0 06/17/2021  ? ?Last metabolic panel ?Lab Results  ?Component Value Date  ? GLUCOSE 94 06/17/2021  ? NA 138 06/17/2021  ? K 4.4 06/17/2021  ? CL 103 06/17/2021  ? CO2 25 06/17/2021  ? BUN 10 06/17/2021  ? CREATININE 0.94 06/17/2021  ? EGFR 62 10/22/2020  ? CALCIUM 9.6 06/17/2021  ? PHOS 3.5 02/04/2012  ? PROT 6.9 06/17/2021  ? ALBUMIN 4.6 06/17/2021  ? BILITOT 0.7 06/17/2021  ? ALKPHOS 73 06/17/2021  ? AST 19 06/17/2021  ? ALT 25 06/17/2021  ? ?Last lipids ?Lab Results  ?Component Value Date  ? CHOL 217 (H) 06/17/2021  ? HDL 66.50 06/17/2021  ? [place]falfa 70 04/22/2018  ? LDLDIRECT 125.0 06/17/2021  ? TRIG 314.0 (H) 06/17/2021  ? CHOLHDL 3 06/17/2021  ? ?Last hemoglobin A1c ?Lab Results  ?Component Value Date  ? HGBA1C 5.8 06/17/2021  ? ?Last thyroid functions ?Lab Results  ?Component Value Date  ? TSH 0.93 06/17/2021  ? ? ?IMPRESSION AND PLAN: ? ?#1 chronic pain syndrome.  Stable. ?Continue oxycodone 5 mg, 1-2 every 6 hours as needed, #90.  Prescription sent today. ?Controlled substance contract up-to-date as is UDS. ?Repeat UDS 6 months. ? ?#2 hypertension, well controlled on Lotrel 10-40 daily. ?Electrolytes and creatinine today. ? ?3.  Chronic renal insufficiency stage III.  GFR around 60. ?Solitary kidney due to nephrectomy 2013. ?Electrolytes and creatinine today. ? ?An After Visit  Summary was printed and given to the patient. ? ?FOLLOW UP: Return in about 3 months (around 12/17/2021) for routine chronic illness f/u. ?Next CPE 06/2022 ? ?Signed:  Crissie Sickles, MD           09/16/2021 ? ?

## 2021-09-19 ENCOUNTER — Encounter: Payer: Self-pay | Admitting: Internal Medicine

## 2021-09-23 ENCOUNTER — Telehealth: Payer: Self-pay | Admitting: Internal Medicine

## 2021-09-23 NOTE — Telephone Encounter (Signed)
Okay.  No charge.  She may reschedule when she is ready. ?

## 2021-09-23 NOTE — Telephone Encounter (Signed)
Patient called to cancel procedure appointment with you tomorrow due to loss of employment. ?

## 2021-09-24 ENCOUNTER — Encounter: Payer: 59 | Admitting: Internal Medicine

## 2021-10-22 ENCOUNTER — Telehealth: Payer: Self-pay | Admitting: Family Medicine

## 2021-10-22 ENCOUNTER — Other Ambulatory Visit: Payer: Self-pay

## 2021-10-22 MED ORDER — OXYCODONE HCL 5 MG PO TABS
ORAL_TABLET | ORAL | 0 refills | Status: DC
Start: 2021-10-22 — End: 2021-11-01

## 2021-10-22 NOTE — Telephone Encounter (Signed)
Requesting: oxycodone ?Contract: 06/04/21 ?UDS: 02/26/21 ?Last Visit: 09/16/21 ?Next Visit: 12/17/21 ?Last Refill: 09/16/21(90,0) ? ?Please Advise. Med pending ?

## 2021-10-22 NOTE — Telephone Encounter (Signed)
Pt advised refill sent. °

## 2021-10-22 NOTE — Telephone Encounter (Signed)
LM for pt regarding medication ?

## 2021-10-22 NOTE — Telephone Encounter (Signed)
Patient refill request.  [place] ? ?oxyCODONE (OXY IR/ROXICODONE) 5 MG immediate release tablet [[id number]]  ? ? ?

## 2021-10-22 NOTE — Addendum Note (Signed)
Addended by: Deveron Furlong D on: 10/22/2021 11:54 AM ? ? Modules accepted: Orders ? ?

## 2021-10-22 NOTE — Telephone Encounter (Signed)
Pt returning call for [facility], about medication ? ?oxyCODONE ?oxyCODONE (OXY IR/ROXICODONE) 5 MG immediate release tablet ? ? ?[id number] [place] [address] [place]  [phone] ?Fax:  [phone] ?  ? ? ? ?

## 2021-10-25 NOTE — Telephone Encounter (Signed)
Noted  

## 2021-10-25 NOTE — Telephone Encounter (Signed)
Kim from [facility] called to let PCP know that pt got only a 7 day supply because that is all the insurance will pay for. Pt did not get the full 90 dispense. ?

## 2021-11-01 ENCOUNTER — Other Ambulatory Visit: Payer: Self-pay

## 2021-11-01 MED ORDER — OXYCODONE HCL 5 MG PO TABS: ORAL_TABLET | ORAL | 0 refills | Status: DC

## 2021-11-01 NOTE — Telephone Encounter (Signed)
Patient could only get 7days worth of meds. ?She needs the rest of the prescription. ?[facility] [place] ? ?oxyCODONE (OXY IR/ROXICODONE) 5 MG immediate release tablet [[id number]]  ?

## 2021-11-01 NOTE — Telephone Encounter (Signed)
Requesting: oxycodone ?Contract: 06/04/21 ?UDS: 02/26/21 ?Last Visit: 09/16/21 ?Next Visit: 12/17/21 ?Last Refill: 10/22/21(90,0) ? ?Please Advise. Med pending. Pt was only able to get 7 d/s of medication which voided out the rest of medication rx ?

## 2021-11-01 NOTE — Telephone Encounter (Signed)
LM for pt regarding medication ?

## 2021-11-01 NOTE — Addendum Note (Signed)
Addended by: Deveron Furlong D on: 11/01/2021 02:35 PM ? ? Modules accepted: Orders ? ?

## 2021-11-05 NOTE — Telephone Encounter (Signed)
Havery Moros [place]: [id number] - PA Case ID: [id number] - Rx #: [id number] ? ?Approved today ?Your PA request has been approved ?

## 2021-11-06 NOTE — Telephone Encounter (Signed)
LM for pt regarding medication. RF sent, PA approved ?

## 2021-12-02 ENCOUNTER — Other Ambulatory Visit: Payer: Self-pay

## 2021-12-02 MED ORDER — OXYCODONE HCL 5 MG PO TABS: ORAL_TABLET | ORAL | 0 refills | Status: DC

## 2021-12-02 NOTE — Telephone Encounter (Signed)
Requesting: oxycodone Contract: 06/04/21 UDS: 02/26/21 Last Visit: 09/16/21 Next Visit: 12/17/21 Last Refill: 11/01/21(50,0)  Please Advise. Med pending

## 2021-12-02 NOTE — Telephone Encounter (Signed)
Patient refill request.  [facility] [place]  30 d/s  oxyCODONE (OXY IR/ROXICODONE) 5 MG immediate release tablet [[id number]]

## 2021-12-02 NOTE — Addendum Note (Signed)
Addended by: Deveron Furlong D on: 12/02/2021 04:17 PM   Modules accepted: Orders

## 2021-12-13 ENCOUNTER — Other Ambulatory Visit: Payer: Self-pay

## 2021-12-13 MED ORDER — GABAPENTIN 300 MG PO CAPS
ORAL_CAPSULE | ORAL | 3 refills | Status: DC
Start: 2021-12-13 — End: 2022-12-19

## 2021-12-13 NOTE — Telephone Encounter (Signed)
Pt has refill on Cymbalta, 09/16/21(90,1).   Last refill on gabapentin 11/08/20(540,0) Please fill, if appropriate. Last OV 09/16/21

## 2021-12-13 NOTE — Telephone Encounter (Signed)
Patient refill request  [facility] [place]  gabapentin (NEURONTIN) 300 MG capsule [[id number]]   DULoxetine (CYMBALTA) 60 MG capsule [[id number]]

## 2021-12-16 NOTE — Telephone Encounter (Signed)
LM for pt regarding medication ?

## 2021-12-16 NOTE — Telephone Encounter (Signed)
Pt advised refill sent. °

## 2021-12-17 ENCOUNTER — Ambulatory Visit (INDEPENDENT_AMBULATORY_CARE_PROVIDER_SITE_OTHER): Payer: 59 | Admitting: Family Medicine

## 2021-12-17 ENCOUNTER — Encounter: Payer: Self-pay | Admitting: Family Medicine

## 2021-12-17 VITALS — BP 109/63 | HR 75 | Temp 98.0°F | Ht 62.25 in | Wt 193.8 lb

## 2021-12-17 DIAGNOSIS — Z79899 Other long term (current) drug therapy: Secondary | ICD-10-CM

## 2021-12-17 DIAGNOSIS — G4719 Other hypersomnia: Secondary | ICD-10-CM

## 2021-12-17 DIAGNOSIS — M170 Bilateral primary osteoarthritis of knee: Secondary | ICD-10-CM

## 2021-12-17 DIAGNOSIS — M25561 Pain in right knee: Secondary | ICD-10-CM

## 2021-12-17 DIAGNOSIS — G8929 Other chronic pain: Secondary | ICD-10-CM

## 2021-12-17 DIAGNOSIS — M25562 Pain in left knee: Secondary | ICD-10-CM | POA: Diagnosis not present

## 2021-12-17 DIAGNOSIS — M1712 Unilateral primary osteoarthritis, left knee: Secondary | ICD-10-CM

## 2021-12-17 DIAGNOSIS — G894 Chronic pain syndrome: Secondary | ICD-10-CM | POA: Diagnosis not present

## 2021-12-17 DIAGNOSIS — G4733 Obstructive sleep apnea (adult) (pediatric): Secondary | ICD-10-CM

## 2021-12-17 DIAGNOSIS — E7800 Pure hypercholesterolemia, unspecified: Secondary | ICD-10-CM | POA: Diagnosis not present

## 2021-12-17 DIAGNOSIS — I10 Essential (primary) hypertension: Secondary | ICD-10-CM | POA: Diagnosis not present

## 2021-12-17 MED ORDER — TRIAMCINOLONE ACETONIDE 40 MG/ML IJ SUSP
40.0000 mg | Freq: Once | INTRAMUSCULAR | Status: AC
  Administered 2021-12-17: 40 mg via INTRAMUSCULAR

## 2021-12-17 NOTE — Patient Instructions (Signed)
Ask your insurer about coverage for victoza, wegovy, ozempic, trulicity, and mounjaro

## 2021-12-17 NOTE — Progress Notes (Signed)
OFFICE VISIT  12/17/2021  CC:  Chief Complaint  Patient presents with   Hypertension   Hyperlipidemia    Pt is not fasting (few sips of coffee with creamer)   HPI:    Patient is a 66 y.o. female who presents for 33-monthfollow-up chronic pain syndrome, chronic renal insufficiency, and hypertension. A/P as of last visit: "#1 chronic pain syndrome.  Stable. Continue oxycodone 5 mg, 1-2 every 6 hours as needed, #90.  Prescription sent today. Controlled substance contract up-to-date as is UDS. Repeat UDS 6 months.   #2 hypertension, well controlled on Lotrel 10-40 daily. Electrolytes and creatinine today.  3.  Chronic renal insufficiency stage III.  GFR around 60. Solitary kidney due to nephrectomy 2013. Electrolytes and creatinine today."  INTERIM HX: Patient frustrated with her weight.  Asks about Wegovy.  Says she is eating a fairly good diet and walking is much as she can--limited by chronic pain in her knees.  Chronically tired.  Has resorted to taking a supplement that has caffeine in. Broken sleep during the night due to nocturia.  Says she gets approximately 4 to 5 hours duration of sleep most nights.  Additionally, she does snore.  No report of apneic events by her husband.  Sleep does not feel restorative.   Indication for chronic opioid: chronic lumbar and bilat knee osteoarthritis + fibromyalgia pain that I treat with pain meds in order to maximize functioning and quality of life. She is also taking cymbalta 670mqd and gabapentin 600 mg nightly usually.  Also Flexeril pretty regularly PMP AWARE reviewed today: most recent rx for oxycodone was filled 12/02/2021, #90, rx by me. No red flags. Her pain in her left knee has been increasing significantly over the last couple months.  No significant swelling.  When she is up on it a lot it hurts worse.  Rest does not help as much lately. She does not have a orthopedist. She recalls me giving an injection of steroid in the past  and it did help for a while.  ROS as above, plus--> no fevers, no CP, no SOB, no wheezing, no cough, no dizziness, no HAs, no rashes, no melena/hematochezia.  No polyuria or polydipsia.  No focal weakness, paresthesias, or tremors.  No acute vision or hearing abnormalities.  No dysuria or unusual/new urinary urgency or frequency.  No recent changes in lower legs. No n/v/d or abd pain.  No palpitations.     Past Medical History:  Diagnosis Date   Bilateral carpal tunnel syndrome    pt to get bilat carpal tunnel release as of 09/2018 ortho eval   BPPV (benign paroxysmal positional vertigo)    Chronic pain syndrome    Chronic renal insufficiency, stage III (moderate) (HCC)    GFR 50's   Fibromyalgia    GERD (gastroesophageal reflux disease)    Heart murmur    Hemorrhoids    History of bronchitis    childhood, wood stove and coal heat   History of galactorrhea 02/05/2011   Hyperlipidemia, mixed    Hypertension    IFG (impaired fasting glucose) 2016   HbA1c 5.7-5.8.   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 MaWyatt Mageign of recurrent dz as of this 05/2016 f/u.  Released from surveillance as of 06/03/16; return to urol prn.   Seasonal  allergic rhinitis    Solitary left kidney 2013   Aquired: right nephrectomy for renal cancer    Past Surgical History:  Procedure Laterality Date   CARPAL TUNNEL RELEASE Right 09/2018   CESAREAN SECTION  1983   COLONOSCOPY  04/2013   Normal.  Repeat 2019 ([facility] of [facility]   DEXA     GYN scheduled this at 05/12/19 f/u.   ROBOT ASSISTED LAPAROSCOPIC NEPHRECTOMY  07/12/2012   Procedure: ROBOTIC ASSISTED LAPAROSCOPIC NEPHRECTOMY;  Surgeon: Alexis Frock, MD;  Location: [facility];  Service: Urology;  Laterality: Right;   TONSILLECTOMY     TUBAL LIGATION       Outpatient Medications Prior to Visit  Medication Sig Dispense Refill   acetaminophen (TYLENOL) 500 MG tablet Take 1,000 mg by mouth every 8 (eight) hours as needed.     amLODipine-benazepril (LOTREL) 10-40 MG capsule TAKE 1 CAPSULE BY MOUTH EVERY DAY 90 capsule 1   atorvastatin (LIPITOR) 80 MG tablet Take 1 tablet (80 mg total) by mouth daily. 90 tablet 1   cyclobenzaprine (FLEXERIL) 10 MG tablet TAKE 1 TABLET BY MOUTH THREE TIMES A DAY AS NEEDED 90 tablet 5   diphenhydramine-acetaminophen (TYLENOL PM) 25-500 MG TABS Take 1 tablet by mouth at bedtime as needed.     DULoxetine (CYMBALTA) 60 MG capsule Take 1 capsule (60 mg total) by mouth daily. TAKE 1 CAPSULE BY MOUTH EVERY DAY 90 capsule 1   gabapentin (NEURONTIN) 300 MG capsule TAKE 2 CAPSULES BY MOUTH 3 TIMES A DAY 540 capsule 3   Multiple Vitamin (MULTIVITAMIN WITH MINERALS) TABS Take 1 tablet by mouth daily.     OVER THE COUNTER MEDICATION Slimvance - for weight loss     OVER THE COUNTER MEDICATION Take 2 tablets by mouth daily. Energize Caffeine Pills Fast Acting Energy Pill     oxyCODONE (OXY IR/ROXICODONE) 5 MG immediate release tablet 1-2 tabs po q6h prn 50 tablet 0   oxyCODONE (OXY IR/ROXICODONE) 5 MG immediate release tablet 1-2 tabs po q6h prn 90 tablet 0   pantoprazole (PROTONIX) 40 MG tablet Take 1 tablet (40 mg total) by mouth daily. 90 tablet 3   promethazine (PHENERGAN) 12.5 MG tablet 1-2 tabs po q6h prn nausea 30 tablet 0   sennosides-docusate sodium (SENOKOT-S) 8.6-50 MG tablet Take 1 tablet by mouth 2 (two) times daily. While taking pain meds to prevent constipation 30 tablet 0   Wheat Dextrin (BENEFIBER PO) Take by mouth.     albuterol (VENTOLIN HFA) 108 (90 Base) MCG/ACT inhaler Inhale 2 puffs into the lungs every 6 (six) hours as needed for wheezing or shortness of breath. (Patient not taking: Reported on 12/17/2021) 1 Inhaler 0   erythromycin ophthalmic ointment Place 1 application into the left eye 3 (three) times  daily. (Patient not taking: Reported on 12/17/2021) 3.5 g 0   No facility-administered medications prior to visit.    Allergies  Allergen Reactions   Fenofibrate Rash   Codeine Nausea Only   Dilaudid [Hydromorphone Hcl] Nausea And Vomiting    ROS As per HPI  PE:    12/17/2021   10:02 AM 09/16/2021    8:25 AM 08/02/2021    8:40 AM  Vitals with BMI  Height 5' 2.25" 5' 2.25" 5' 2.25"  Weight 193 lbs 13 oz 191 lbs 6 oz 190 lbs  BMI 35.17 67.89 38.10  Systolic 175 102 585  Diastolic 63 67 70  Pulse 75 75 91     Physical Exam  General: Alert and  well-appearing. Left knee: Mild tenderness to firm palpation anteriorly.  No erythema or swelling. Mild crepitus.  Flexion to 110 degrees, extension full. No joint line tenderness.  Ligamentous testing normal.  LABS:  Last CBC Lab Results  Component Value Date   WBC 5.4 06/17/2021   HGB 12.7 06/17/2021   HCT 38.7 06/17/2021   MCV 92.6 06/17/2021   MCH 31.9 04/22/2018   RDW 13.5 06/17/2021   PLT 263.0 65/46/5035   Last metabolic panel Lab Results  Component Value Date   GLUCOSE 94 09/16/2021   NA 141 09/16/2021   K 4.4 09/16/2021   CL 104 09/16/2021   CO2 25 09/16/2021   BUN 16 09/16/2021   CREATININE 1.01 09/16/2021   EGFR 62 10/22/2020   CALCIUM 9.2 09/16/2021   PHOS 3.5 02/04/2012   PROT 6.9 06/17/2021   ALBUMIN 4.6 06/17/2021   BILITOT 0.7 06/17/2021   ALKPHOS 73 06/17/2021   AST 19 06/17/2021   ALT 25 06/17/2021   Last lipids Lab Results  Component Value Date   CHOL 217 (H) 06/17/2021   HDL 66.50 06/17/2021   LDLCALC 70 04/22/2018   LDLDIRECT 125.0 06/17/2021   TRIG 314.0 (H) 06/17/2021   CHOLHDL 3 06/17/2021   Lab Results  Component Value Date   HGBA1C 5.8 06/17/2021   IMPRESSION AND PLAN:  #1 chronic pain syndrome: Fibromyalgia plus bilateral knee pain due to osteoarthritis. Acute osteoarthritis pain/flare left knee. Discussed plan of injection of steroid today into the knee.  She understands  that this is not a fix.  In the near future after we sort out her fatigue and sleep apnea evaluation we will get her to orthopedics because it may be that hyaluronic acid injections would be her next best option.  Continue oxycodone 5 mg, 1-2 every 6 hours as needed.  No new prescription needed today. Continue gabapentin 600 mg 3 times a day, cyclobenzaprine 10 mg 3 times daily as needed, and Cymbalta 60 mg a day.  Ultrasound-guided injection is preferred based on studies that show increased duration, increased effect, greater accuracy, decreased procedural pain, increased response rate, and decreased cost with ultrasound-guided versus blind injection. Procedure: Real-time ultrasound guided injection of left knee. Device: [employer] informed consent obtained.  Timeout conducted.  No overlying erythema, induration, or other signs of local infection. After sterile prep with Betadine, injected 3 cc of 1% lidocaine for local anesthesia, followed by mixture of 3 cc 1% lidocaine without epinephrine and 40 mg kenalog using 1 1/2" 25G needle, passing the needle from lateral approach into SPP.  Injectate seen filling SPP. Patient tolerated the procedure well.  No immediate complications.  Post-injection care discussed. Advised to call if fever/chills, erythema, drainage, or persistent bleeding. Impression: Technically successful ultrasound-guided injection.  #2 excessive daytime sleepiness. Needs further evaluation for possible obstructive sleep apnea--referred to pulmonology today.  Encourage patient to minimize use of caffeine as an energy supplement.  #3 hypertension, well controlled on amlodipine-benazepril 10-40 daily. Electrolytes and creatinine monitoring today.  4.  Hyperlipidemia.  Doing well on atorvastatin 80 mg a day. Lipid panel and hepatic panel today.  An After Visit Summary was printed and given to the patient.  FOLLOW UP: Return in about 6 months (around 06/18/2022) for annual  CPE (fasting.  Signed:  Crissie Sickles, MD           12/17/2021

## 2021-12-18 LAB — LIPID PANEL
Cholesterol: 183 mg/dL (ref <200)
HDL: 69 mg/dL (ref >=50)
LDL Cholesterol (Calc): 81 mg/dL (calc)
Non-HDL Cholesterol (Calc): 114 mg/dL (calc) (ref <130)
Total CHOL/HDL Ratio: 2.7 (calc) (ref <5.0)
Triglycerides: 239 mg/dL — ABNORMAL HIGH (ref <150)

## 2021-12-18 LAB — COMPREHENSIVE METABOLIC PANEL
AG Ratio: 1.9 (calc) (ref 1.0–2.5)
ALT: 38 U/L — ABNORMAL HIGH (ref 6–29)
AST: 31 U/L (ref 10–35)
Albumin: 4.3 g/dL (ref 3.6–5.1)
Alkaline phosphatase (APISO): 96 U/L (ref 37–153)
BUN: 10 mg/dL (ref 7–25)
CO2: 23 mmol/L (ref 20–32)
Calcium: 9.2 mg/dL (ref 8.6–10.4)
Chloride: 105 mmol/L (ref 98–110)
Creat: 1.04 mg/dL (ref 0.50–1.05)
Globulin: 2.3 g/dL (calc) (ref 1.9–3.7)
Glucose, Bld: 95 mg/dL (ref 65–99)
Potassium: 4.7 mmol/L (ref 3.5–5.3)
Sodium: 139 mmol/L (ref 135–146)
Total Bilirubin: 0.5 mg/dL (ref 0.2–1.2)
Total Protein: 6.6 g/dL (ref 6.1–8.1)

## 2021-12-24 ENCOUNTER — Ambulatory Visit (AMBULATORY_SURGERY_CENTER): Payer: 59 | Admitting: *Deleted

## 2021-12-24 VITALS — Ht 62.25 in | Wt 193.0 lb

## 2021-12-24 DIAGNOSIS — Z800 Family history of malignant neoplasm of digestive organs: Secondary | ICD-10-CM

## 2021-12-24 NOTE — Progress Notes (Signed)

## 2022-01-06 ENCOUNTER — Other Ambulatory Visit: Payer: Self-pay

## 2022-01-06 MED ORDER — OXYCODONE HCL 5 MG PO TABS: ORAL_TABLET | ORAL | 0 refills | Status: DC

## 2022-01-13 ENCOUNTER — Encounter: Payer: Self-pay | Admitting: Internal Medicine

## 2022-01-21 ENCOUNTER — Encounter: Payer: Self-pay | Admitting: Internal Medicine

## 2022-01-21 ENCOUNTER — Ambulatory Visit (AMBULATORY_SURGERY_CENTER): Payer: 59 | Admitting: Internal Medicine

## 2022-01-21 VITALS — BP 110/58 | HR 68 | Temp 96.6°F | Resp 15 | Ht 62.25 in | Wt 193.0 lb

## 2022-01-21 DIAGNOSIS — K635 Polyp of colon: Secondary | ICD-10-CM | POA: Diagnosis not present

## 2022-01-21 DIAGNOSIS — Z1211 Encounter for screening for malignant neoplasm of colon: Secondary | ICD-10-CM

## 2022-01-21 DIAGNOSIS — Z800 Family history of malignant neoplasm of digestive organs: Secondary | ICD-10-CM | POA: Diagnosis not present

## 2022-01-21 DIAGNOSIS — D124 Benign neoplasm of descending colon: Secondary | ICD-10-CM

## 2022-01-21 MED ORDER — SODIUM CHLORIDE 0.9 % IV SOLN: 500.0000 mL | INTRAVENOUS | Status: DC

## 2022-01-21 NOTE — Op Note (Signed)
[facility] Patient Name: [name] Procedure Date: 01/21/2022 3:13 PM MRN: [id number] Endoscopist: Gatha Mayer , MD Age: 66 Referring MD:  Date of Birth: 11-13-1955 Gender: Female Account #: [id number] Procedure:                Colonoscopy Indications:              Screening in patient at increased risk: Colorectal                            cancer in father before age 23 Medicines:                Monitored Anesthesia Care Procedure:                Pre-Anesthesia Assessment:                           - Prior to the procedure, a History and Physical                            was performed, and patient medications and                            allergies were reviewed. The patient's tolerance of                            previous anesthesia was also reviewed. The risks                            and benefits of the procedure and the sedation                            options and risks were discussed with the patient.                            All questions were answered, and informed consent                            was obtained. Prior Anticoagulants: The patient has                            taken no previous anticoagulant or antiplatelet                            agents. ASA Grade Assessment: III - A patient with                            severe systemic disease. After reviewing the risks                            and benefits, the patient was deemed in                            satisfactory condition to undergo the procedure.  After obtaining informed consent, the colonoscope                            was passed under direct vision. Throughout the                            procedure, the patient's blood pressure, pulse, and                            oxygen saturations were monitored continuously. The                            CF [id number] #[id number] was introduced through the anus                            and advanced to the the  cecum, identified by                            appendiceal orifice and ileocecal valve. The                            colonoscopy was performed without difficulty. The                            patient tolerated the procedure well. The quality                            of the bowel preparation was excellent. The                            ileocecal valve, appendiceal orifice, and rectum                            were photographed. The bowel preparation used was                            Miralax via split dose instruction. Scope In: 3:19:08 PM Scope Out: 3:35:21 PM Scope Withdrawal Time: 0 hours 10 minutes 31 seconds  Total Procedure Duration: 0 hours 16 minutes 13 seconds  Findings:                 The perianal and digital rectal examinations were                            normal.                           A diminutive polyp was found in the descending                            colon. The polyp was sessile. The polyp was removed                            with a cold snare. Resection and retrieval were  complete. Verification of patient identification                            for the specimen was done. Estimated blood loss was                            minimal.                           The exam was otherwise without abnormality on                            direct and retroflexion views. Complications:            No immediate complications. Estimated Blood Loss:     Estimated blood loss was minimal. Impression:               - One diminutive polyp in the descending colon,                            removed with a cold snare. Resected and retrieved.                           - The examination was otherwise normal on direct                            and retroflexion views. Recommendation:           - Patient has a contact number available for                            emergencies. The signs and symptoms of potential                             delayed complications were discussed with the                            patient. Return to normal activities tomorrow.                            Written discharge instructions were provided to the                            patient.                           - Resume previous diet.                           - Continue present medications.                           - Await pathology results.                           - Repeat colonoscopy in 5 years. Gatha Mayer, MD 01/21/2022 3:40:40 PM This report has been signed electronically.

## 2022-01-21 NOTE — Progress Notes (Signed)
Pt's states no medical or surgical changes since previsit or office visit. 

## 2022-01-21 NOTE — Progress Notes (Signed)
Called to room to assist during endoscopic procedure.  Patient ID and intended procedure confirmed with present staff. Received instructions for my participation in the procedure from the performing physician.  

## 2022-01-21 NOTE — Progress Notes (Signed)
[facility] History and Physical   Primary Care Physician:  Tammi Sou, MD   Reason for Procedure:   LLQ pain, FHx CRCA and constipation  Plan:    colonoscopy     HPI: [name] is a 66 y.o. female seen 07/2021 w/ following issues  LLQ pain  Chronic constipation   Gastroesophageal reflux disease with esophagitis, unspecified whether hemorrhage   Urinary incontinence, unspecified type   Family history of colon cancer-father 33   Fibromyalgia    Past Medical History:  Diagnosis Date   Allergy    Bilateral carpal tunnel syndrome    pt to get bilat carpal tunnel release as of 09/2018 ortho eval   Bilateral primary osteoarthritis of knee    L tricompart, R medial compart   BPPV (benign paroxysmal positional vertigo)    Cancer (HCC)    RIGHT KIDNEY   Cataract    Chronic pain syndrome    Chronic renal insufficiency, stage III (moderate) (HCC)    GFR 50's   Fibromyalgia    GERD (gastroesophageal reflux disease)    Heart murmur    Hemorrhoids    History of bronchitis    childhood, wood stove and coal heat   History of galactorrhea 02/05/2011   Hyperlipidemia, mixed    Hypertension    IFG (impaired fasting glucose) 2016   HbA1c 5.7-5.8.   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 Wyatt Mage sign of recurrent dz as of this 05/2016 f/u.  Released from surveillance as of 06/03/16; return to urol prn.   Seasonal allergic rhinitis    Solitary left kidney 2013   Aquired: right nephrectomy for renal cancer    Past Surgical History:  Procedure Laterality Date   CARPAL TUNNEL RELEASE Right 09/2018   CESAREAN SECTION  1983   COLONOSCOPY  04/2013   Normal.  Repeat 2019 (FH of CRCA   DEXA     GYN scheduled this at 05/12/19 f/u.   ROBOT ASSISTED  LAPAROSCOPIC NEPHRECTOMY  07/12/2012   Procedure: ROBOTIC ASSISTED LAPAROSCOPIC NEPHRECTOMY;  Surgeon: Alexis Frock, MD;  Location: [facility];  Service: Urology;  Laterality: Right;   TONSILLECTOMY     TUBAL LIGATION      Prior to Admission medications   Medication Sig Start Date End Date Taking? Authorizing Provider  acetaminophen (TYLENOL) 500 MG tablet Take 1,000 mg by mouth every 8 (eight) hours as needed.   Yes [provider]  amLODipine-benazepril (LOTREL) 10-40 MG capsule TAKE 1 CAPSULE BY MOUTH EVERY DAY 09/16/21  Yes McGowen, Adrian Blackwater, MD  atorvastatin (LIPITOR) 80 MG tablet Take 1 tablet (80 mg total) by mouth daily. 09/16/21  Yes McGowen, Adrian Blackwater, MD  cyclobenzaprine (FLEXERIL) 10 MG tablet TAKE 1 TABLET BY MOUTH THREE TIMES A DAY AS NEEDED 09/16/21  Yes McGowen, Adrian Blackwater, MD  diphenhydramine-acetaminophen (TYLENOL PM) 25-500 MG TABS Take 1 tablet by mouth at bedtime as needed.   Yes [provider]  DULoxetine (CYMBALTA) 60 MG capsule Take 1 capsule (60 mg total) by mouth daily. TAKE 1 CAPSULE BY MOUTH EVERY DAY 09/16/21  Yes McGowen, Adrian Blackwater, MD  gabapentin (NEURONTIN) 300 MG capsule TAKE 2 CAPSULES BY MOUTH 3 TIMES A DAY 12/13/21  Yes McGowen, Adrian Blackwater, MD  oxyCODONE (OXY IR/ROXICODONE) 5 MG immediate release tablet 1-2 tabs po q6h  prn 01/06/22  Yes McGowen, Adrian Blackwater, MD  pantoprazole (PROTONIX) 40 MG tablet Take 1 tablet (40 mg total) by mouth daily. 08/02/21  Yes McGowen, Adrian Blackwater, MD  promethazine (PHENERGAN) 12.5 MG tablet 1-2 tabs po q6h prn nausea 12/14/19  Yes McGowen, Adrian Blackwater, MD  albuterol (VENTOLIN HFA) 108 (90 Base) MCG/ACT inhaler Inhale 2 puffs into the lungs every 6 (six) hours as needed for wheezing or shortness of breath. Patient not taking: Reported on 12/24/2021 06/16/16   Tammi Sou, MD  Multiple Vitamin (MULTIVITAMIN WITH MINERALS) TABS Take 1 tablet by mouth daily. Patient not taking: Reported on 12/24/2021    [provider]  OVER THE  COUNTER MEDICATION Take 2 tablets by mouth daily. Energize Caffeine Pills Fast Acting Energy Pill Patient not taking: Reported on 12/24/2021 11/16/21   [provider]  sennosides-docusate sodium (SENOKOT-S) 8.6-50 MG tablet Take 1 tablet by mouth 2 (two) times daily. While taking pain meds to prevent constipation 07/13/12   Alexis Frock, MD  Wheat Dextrin (BENEFIBER PO) Take by mouth. Patient not taking: Reported on 12/24/2021    [provider]    Current Outpatient Medications  Medication Sig Dispense Refill   acetaminophen (TYLENOL) 500 MG tablet Take 1,000 mg by mouth every 8 (eight) hours as needed.     amLODipine-benazepril (LOTREL) 10-40 MG capsule TAKE 1 CAPSULE BY MOUTH EVERY DAY 90 capsule 1   atorvastatin (LIPITOR) 80 MG tablet Take 1 tablet (80 mg total) by mouth daily. 90 tablet 1   cyclobenzaprine (FLEXERIL) 10 MG tablet TAKE 1 TABLET BY MOUTH THREE TIMES A DAY AS NEEDED 90 tablet 5   diphenhydramine-acetaminophen (TYLENOL PM) 25-500 MG TABS Take 1 tablet by mouth at bedtime as needed.     DULoxetine (CYMBALTA) 60 MG capsule Take 1 capsule (60 mg total) by mouth daily. TAKE 1 CAPSULE BY MOUTH EVERY DAY 90 capsule 1   gabapentin (NEURONTIN) 300 MG capsule TAKE 2 CAPSULES BY MOUTH 3 TIMES A DAY 540 capsule 3   oxyCODONE (OXY IR/ROXICODONE) 5 MG immediate release tablet 1-2 tabs po q6h prn 90 tablet 0   pantoprazole (PROTONIX) 40 MG tablet Take 1 tablet (40 mg total) by mouth daily. 90 tablet 3   promethazine (PHENERGAN) 12.5 MG tablet 1-2 tabs po q6h prn nausea 30 tablet 0   albuterol (VENTOLIN HFA) 108 (90 Base) MCG/ACT inhaler Inhale 2 puffs into the lungs every 6 (six) hours as needed for wheezing or shortness of breath. (Patient not taking: Reported on 12/24/2021) 1 Inhaler 0   Multiple Vitamin (MULTIVITAMIN WITH MINERALS) TABS Take 1 tablet by mouth daily. (Patient not taking: Reported on 12/24/2021)     OVER THE COUNTER MEDICATION Take 2 tablets by mouth daily.  Energize Caffeine Pills Fast Acting Energy Pill (Patient not taking: Reported on 12/24/2021)     sennosides-docusate sodium (SENOKOT-S) 8.6-50 MG tablet Take 1 tablet by mouth 2 (two) times daily. While taking pain meds to prevent constipation 30 tablet 0   Wheat Dextrin (BENEFIBER PO) Take by mouth. (Patient not taking: Reported on 12/24/2021)     Current Facility-Administered Medications  Medication Dose Route Frequency Provider Last Rate Last Admin   0.9 %  sodium chloride infusion  500 mL Intravenous Continuous Gatha Mayer, MD        Allergies as of 01/21/2022 - Review Complete 01/21/2022  Allergen Reaction Noted   Fenofibrate Rash 05/22/2021   Codeine Nausea Only 01/31/2011   Dilaudid [hydromorphone hcl] Nausea And Vomiting  08/04/2012    Family History  Problem Relation Age of Onset   Thyroid disease Mother    Heart disease Mother        CHF, heart murmur, PAD   Kidney disease Mother        RAS   Other Mother 62       bowel obstruction,surgery this year   Colon cancer Father 36   Cancer Sister        cervical, breast   Thyroid disease Sister    Thyroid disease Sister    Diabetes Sister    Diabetes Sister    Hypertension Sister    Cancer Sister        kidney   Heart disease Brother    Cancer Paternal Aunt        breast cancer   Heart disease Maternal Grandmother    Thyroid disease Maternal Grandmother    Thyroid disease Maternal Grandfather    Cancer Maternal Grandfather    Diabetes Paternal Grandmother    Cancer Paternal Grandmother        breast   Heart disease Paternal Grandmother    Heart disease Paternal Grandfather    Diabetes Paternal Grandfather    Hypertension Paternal Grandfather    Asthma Daughter    Allergies Daughter    Lung disease Daughter        recurrent bronchitis   Lung disease Son        recurrent bronchitis   Colon cancer Paternal Great-grandmother    Crohn's disease Neg Hx    Esophageal cancer Neg Hx    Rectal cancer Neg Hx     Stomach cancer Neg Hx     Social History   Socioeconomic History   Marital status: Married    Spouse name: Jerrye Beavers   Number of children: 3   Years of education: Not on file   Highest education level: Not on file  Occupational History   Not on file  Tobacco Use   Smoking status: Former    Packs/day: 2.00    Years: 5.00    Total pack years: 10.00    Types: Cigarettes    Quit date: 07/14/1981    Years since quitting: 40.5    Passive exposure: Never   Smokeless tobacco: Never  Vaping Use   Vaping Use: Never used  Substance and Sexual Activity   Alcohol use: No   Drug use: No   Sexual activity: Yes  Other Topics Concern   Not on file  Social History Narrative   Married, 3 children.   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   No tobacco, alcohol, or drugs.  No exercise.   Social Determinants of Health   Financial Resource Strain: Not on file  Food Insecurity: Not on file  Transportation Needs: Not on file  Physical Activity: Not on file  Stress: Not on file  Social Connections: Not on file  Intimate Partner Violence: Not on file    Review of Systems:  All other review of systems negative except as mentioned in the HPI.  Physical Exam: Vital signs BP (!) 141/85   Pulse 78   Temp (!) 96.6 F (35.9 C)   Ht 5' 2.25" (1.581 m)   Wt 193 lb (87.5 kg)   SpO2 98%   BMI 35.02 kg/m   General:   Alert,  Well-developed, well-nourished, pleasant and  cooperative in NAD Lungs:  Clear throughout to auscultation.   Heart:  Regular rate and rhythm; no murmurs, clicks, rubs,  or gallops. Abdomen:  Soft, nontender and nondistended. Normal bowel sounds.   Neuro/Psych:  Alert and cooperative. Normal mood and affect. A and O x 3   '@Toshiko Kemler'$  Simonne Maffucci, MD, [facility] [phone] (pager) 01/21/2022 3:08 PM@

## 2022-01-21 NOTE — Progress Notes (Signed)
PT taken to PACU. Monitors in place. VSS. Report given to RN. 

## 2022-01-21 NOTE — Patient Instructions (Addendum)
I found one tiny polyp and removed it. I will let you know what it is but it looks benign. Next routine colonoscopy or other screening test in 5 years - 2028.  I appreciate the opportunity to care for you. Gatha Mayer, MD, [facility]  Discharge instructions given. Handout on polyps. Resume previous medications. YOU HAD AN ENDOSCOPIC PROCEDURE TODAY AT [facility]:   Refer to the procedure report that was given to you for any specific questions about what was found during the examination.  If the procedure report does not answer your questions, please call your gastroenterologist to clarify.  If you requested that your care partner not be given the details of your procedure findings, then the procedure report has been included in a sealed envelope for you to review at your convenience later.  YOU SHOULD EXPECT: Some feelings of bloating in the abdomen. Passage of more gas than usual.  Walking can help get rid of the air that was put into your GI tract during the procedure and reduce the bloating. If you had a lower endoscopy (such as a colonoscopy or flexible sigmoidoscopy) you may notice spotting of blood in your stool or on the toilet paper. If you underwent a bowel prep for your procedure, you may not have a normal bowel movement for a few days.  Please Note:  You might notice some irritation and congestion in your nose or some drainage.  This is from the oxygen used during your procedure.  There is no need for concern and it should clear up in a day or so.  SYMPTOMS TO REPORT IMMEDIATELY:  Following lower endoscopy (colonoscopy or flexible sigmoidoscopy):  Excessive amounts of blood in the stool  Significant tenderness or worsening of abdominal pains  Swelling of the abdomen that is new, acute  Fever of 100F or higher   For urgent or emergent issues, a gastroenterologist can be reached at any hour by calling [phone]. Do not use MyChart messaging for urgent  concerns.    DIET:  We do recommend a small meal at first, but then you may proceed to your regular diet.  Drink plenty of fluids but you should avoid alcoholic beverages for 24 hours.  ACTIVITY:  You should plan to take it easy for the rest of today and you should NOT DRIVE or use heavy machinery until tomorrow (because of the sedation medicines used during the test).    FOLLOW UP: Our staff will call the number listed on your records the next business day following your procedure.  We will call around 7:15- 8:00 am to check on you and address any questions or concerns that you may have regarding the information given to you following your procedure. If we do not reach you, we will leave a message.  If you develop any symptoms (ie: fever, flu-like symptoms, shortness of breath, cough etc.) before then, please call [phone].  If you test positive for Covid 19 in the 2 weeks post procedure, please call and report this information to [place].    If any biopsies were taken you will be contacted by phone or by letter within the next 1-3 weeks.  Please call us at [phone] if you have not heard about the biopsies in 3 weeks.    SIGNATURES/CONFIDENTIALITY: You and/or your care partner have signed paperwork which will be entered into your electronic medical record.  These signatures attest to the fact that that the information above on your After Visit  Summary has been reviewed and is understood.  Full responsibility of the confidentiality of this discharge information lies with you and/or your care-partner.  

## 2022-01-22 ENCOUNTER — Telehealth: Payer: Self-pay | Admitting: *Deleted

## 2022-01-22 NOTE — Telephone Encounter (Signed)
Attempted f/u phone call. No answer. Mailbox is full, unable to leave message.

## 2022-01-29 ENCOUNTER — Encounter: Payer: Self-pay | Admitting: Internal Medicine

## 2022-02-05 ENCOUNTER — Other Ambulatory Visit: Payer: Self-pay

## 2022-02-05 MED ORDER — OXYCODONE HCL 5 MG PO TABS: ORAL_TABLET | ORAL | 0 refills | Status: DC

## 2022-02-05 NOTE — Telephone Encounter (Signed)
Requesting: oxycodone Contract: 06/04/21 UDS: 02/26/21 Last Visit:12/17/21 Next Visit: 03/19/22 Last Refill: 01/06/22(90,0)  Please Advise. Med pending

## 2022-02-05 NOTE — Telephone Encounter (Signed)
Patient refill request.  [facility] [place]  oxyCODONE (OXY IR/ROXICODONE) 5 MG immediate release tablet [[id number]]

## 2022-03-13 ENCOUNTER — Other Ambulatory Visit: Payer: Self-pay

## 2022-03-13 MED ORDER — OXYCODONE HCL 5 MG PO TABS: ORAL_TABLET | ORAL | 0 refills | Status: DC

## 2022-03-13 NOTE — Telephone Encounter (Signed)
Requesting: oxycodone  Contract: 06/11/20 UDS: 02/26/21 Last Visit: 12/17/21 Next Visit: 03/19/22 Last Refill: 02/05/22(90,0)  Please Advise. Med pending

## 2022-03-13 NOTE — Telephone Encounter (Signed)
Pt advised refill sent. °

## 2022-03-13 NOTE — Telephone Encounter (Signed)
Patient refill request.  [facility] [place]  oxyCODONE (OXY IR/ROXICODONE) 5 MG immediate release tablet [[id number]]

## 2022-03-19 ENCOUNTER — Ambulatory Visit (INDEPENDENT_AMBULATORY_CARE_PROVIDER_SITE_OTHER): Payer: 59 | Admitting: Family Medicine

## 2022-03-19 ENCOUNTER — Encounter: Payer: Self-pay | Admitting: Family Medicine

## 2022-03-19 VITALS — BP 105/61 | HR 76 | Temp 97.7°F | Ht 62.0 in | Wt 190.0 lb

## 2022-03-19 DIAGNOSIS — G894 Chronic pain syndrome: Secondary | ICD-10-CM

## 2022-03-19 DIAGNOSIS — M797 Fibromyalgia: Secondary | ICD-10-CM | POA: Diagnosis not present

## 2022-03-19 DIAGNOSIS — M170 Bilateral primary osteoarthritis of knee: Secondary | ICD-10-CM | POA: Diagnosis not present

## 2022-03-19 DIAGNOSIS — I10 Essential (primary) hypertension: Secondary | ICD-10-CM

## 2022-03-19 DIAGNOSIS — N1831 Chronic kidney disease, stage 3a: Secondary | ICD-10-CM

## 2022-03-19 LAB — BASIC METABOLIC PANEL
BUN: 13 mg/dL (ref 6–23)
CO2: 25 mEq/L (ref 19–32)
Calcium: 9.6 mg/dL (ref 8.4–10.5)
Chloride: 102 mEq/L (ref 96–112)
Creatinine, Ser: 1.19 mg/dL (ref 0.40–1.20)
GFR: 48.41 mL/min — ABNORMAL LOW (ref >60.00)
Glucose, Bld: 97 mg/dL (ref 70–99)
Potassium: 4.4 mEq/L (ref 3.5–5.1)
Sodium: 137 mEq/L (ref 135–145)

## 2022-03-19 MED ORDER — TRIAMCINOLONE ACETONIDE 80 MG/ML IJ SUSP
80.0000 mg | Freq: Once | INTRAMUSCULAR | Status: AC
  Administered 2022-03-19: 80 mg via INTRA_ARTICULAR

## 2022-03-19 MED ORDER — AZITHROMYCIN 250 MG PO TABS: ORAL_TABLET | ORAL | 0 refills | Status: DC

## 2022-03-19 MED ORDER — DULOXETINE HCL 60 MG PO CPEP: 60.0000 mg | ORAL_CAPSULE | Freq: Every day | ORAL | 1 refills | Status: DC

## 2022-03-19 MED ORDER — CYCLOBENZAPRINE HCL 10 MG PO TABS: ORAL_TABLET | ORAL | 5 refills | Status: DC

## 2022-03-19 MED ORDER — ATORVASTATIN CALCIUM 80 MG PO TABS: 80.0000 mg | ORAL_TABLET | Freq: Every day | ORAL | 1 refills | Status: DC

## 2022-03-19 MED ORDER — AMLODIPINE BESY-BENAZEPRIL HCL 10-40 MG PO CAPS: ORAL_CAPSULE | ORAL | 1 refills | Status: DC

## 2022-03-19 NOTE — Addendum Note (Signed)
Addended by: Deveron Furlong D on: 03/19/2022 11:48 AM   Modules accepted: Orders

## 2022-03-19 NOTE — Progress Notes (Signed)
Office Note 03/19/2022  CC:  Chief Complaint  Patient presents with   Annual Exam    Patient is a 66 y.o. female who is here for  57-monthfollow-up chronic pain syndrome, hypertension, and chronic renal insufficiency. A/P as of last visit: "#1 chronic pain syndrome: Fibromyalgia plus bilateral knee pain due to osteoarthritis. Acute osteoarthritis pain/flare left knee. Discussed plan of injection of steroid today into the knee.  She understands that this is not a fix.  I did u/s guided L knee steroid injection today. In the near future after we sort out her fatigue and sleep apnea evaluation we will get her to orthopedics because it may be that hyaluronic acid injections would be her next best option.  Continue oxycodone 5 mg, 1-2 every 6 hours as needed.  No new prescription needed today. Continue gabapentin 600 mg 3 times a day, cyclobenzaprine 10 mg 3 times daily as needed, and Cymbalta 60 mg a day.   #2 excessive daytime sleepiness. Needs further evaluation for possible obstructive sleep apnea--referred to pulmonology today.  Encourage patient to minimize use of caffeine as an energy supplement.   #3 hypertension, well controlled on amlodipine-benazepril 10-40 daily. Electrolytes and creatinine monitoring today.  4.  Hyperlipidemia.  Doing well on atorvastatin 80 mg a day. Lipid panel and hepatic panel today.  INTERIM HX: URI sx's x 2-3 wks, not improving, PND prominent, now starting to get facial pressure and HAs. No fevers, no cough, no wheezing.  Nose and throat dry. Home covid today NEG.  The L knee injection last visit helped >50% but pain has returned last couple weeks, also has bad chronic/worsening pain in R knee: asks for steroid injections in both knees today.  She chose not to schedule an appt with [facility] pulm for OSA eval.  Pain med use stable. Indication for chronic opioid: chronic lumbar and bilat knee osteoarthritis + fibromyalgia pain that I treat with pain  meds in order to maximize functioning and quality of life.  Not NSAID candidate due to chronic renal insufficiency. She is also taking cymbalta '60mg'$  qd and gabapentin 600 mg nightly usually.  Also Flexeril pretty regularly. PMP AWARE reviewed today: most recent rx for oxycodone was filled 03/11/2022, #90, rx by me. No red flags.  ROS as above, plus-->  no CP, no SOB,  no dizziness,no rashes, no melena/hematochezia.  No polyuria or polydipsia.    No focal weakness, paresthesias, or tremors.  No acute vision or hearing abnormalities.  No dysuria or unusual/new urinary urgency or frequency.  No recent changes in lower legs. No n/v/d or abd pain.  No palpitations.    Past Medical History:  Diagnosis Date   Allergy    Bilateral carpal tunnel syndrome    pt to get bilat carpal tunnel release as of 09/2018 ortho eval   Bilateral primary osteoarthritis of knee    L tricompart, R medial compart   BPPV (benign paroxysmal positional vertigo)    Cancer (HCC)    RIGHT KIDNEY   Cataract    Chronic pain syndrome    Chronic renal insufficiency, stage III (moderate) (HCC)    GFR 50's   Fibromyalgia    GERD (gastroesophageal reflux disease)    Heart murmur    Hemorrhoids    History of bronchitis    childhood, wood stove and coal heat   History of galactorrhea 02/05/2011   Hyperlipidemia, mixed    Hypertension    IFG (impaired fasting glucose) 2016   HbA1c 5.7-5.8.  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 Wyatt Mage sign of recurrent dz as of this 05/2016 f/u.  Released from surveillance as of 06/03/16; return to urol prn.   Seasonal allergic rhinitis    Solitary left kidney 2013   Aquired: right nephrectomy for renal cancer    Past Surgical History:  Procedure Laterality Date   CARPAL TUNNEL  RELEASE Right 09/2018   CESAREAN SECTION  1983   COLONOSCOPY  04/2013   Normal 2014.  01/2022 normal. Recall 71yr d/t FH CRCA   DEXA     GYN scheduled this at 05/12/19 f/u.   ROBOT ASSISTED LAPAROSCOPIC NEPHRECTOMY  07/12/2012   Procedure: ROBOTIC ASSISTED LAPAROSCOPIC NEPHRECTOMY;  Surgeon: TAlexis Frock MD;  Location: [facility];  Service: Urology;  Laterality: Right;   TONSILLECTOMY     TUBAL LIGATION      Family History  Problem Relation Age of Onset   Thyroid disease Mother    Heart disease Mother        CHF, heart murmur, PAD   Kidney disease Mother        RAS   Other Mother 754      bowel obstruction,surgery this year   Colon cancer Father 378  Cancer Sister        cervical, breast   Thyroid disease Sister    Thyroid disease Sister    Diabetes Sister    Diabetes Sister    Hypertension Sister    Cancer Sister        kidney   Heart disease Brother    Cancer Paternal Aunt        breast cancer   Heart disease Maternal Grandmother    Thyroid disease Maternal Grandmother    Thyroid disease Maternal Grandfather    Cancer Maternal Grandfather    Diabetes Paternal Grandmother    Cancer Paternal Grandmother        breast   Heart disease Paternal Grandmother    Heart disease Paternal Grandfather    Diabetes Paternal Grandfather    Hypertension Paternal Grandfather    Asthma Daughter    Allergies Daughter    Lung disease Daughter        recurrent bronchitis   Lung disease Son        recurrent bronchitis   Colon cancer Paternal Great-grandmother    Crohn's disease Neg Hx    Esophageal cancer Neg Hx    Rectal cancer Neg Hx    Stomach cancer Neg Hx     Social History   Socioeconomic History   Marital status: Married    Spouse name: MJerrye Beavers  Number of children: 3   Years of education: Not on file   Highest education level: Not on file  Occupational History   Not on file  Tobacco Use   Smoking status: Former    Packs/day: 2.00    Years: 5.00    Total pack  years: 10.00    Types: Cigarettes    Quit date: 07/14/1981    Years since quitting: 40.7    Passive exposure: Never   Smokeless tobacco: Never  Vaping Use   Vaping Use: Never used  Substance and Sexual Activity   Alcohol use: No   Drug use: No   Sexual activity: Yes  Other Topics Concern   Not on  file  Social History Narrative   Married, 3 children.   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   No tobacco, alcohol, or drugs.  No exercise.   Social Determinants of Health   Financial Resource Strain: Not on file  Food Insecurity: Not on file  Transportation Needs: Not on file  Physical Activity: Not on file  Stress: Not on file  Social Connections: Not on file  Intimate Partner Violence: Not on file    Outpatient Medications Prior to Visit  Medication Sig Dispense Refill   acetaminophen (TYLENOL) 500 MG tablet Take 1,000 mg by mouth every 8 (eight) hours as needed.     amLODipine-benazepril (LOTREL) 10-40 MG capsule TAKE 1 CAPSULE BY MOUTH EVERY DAY 90 capsule 1   atorvastatin (LIPITOR) 80 MG tablet Take 1 tablet (80 mg total) by mouth daily. 90 tablet 1   cyclobenzaprine (FLEXERIL) 10 MG tablet TAKE 1 TABLET BY MOUTH THREE TIMES A DAY AS NEEDED 90 tablet 5   DULoxetine (CYMBALTA) 60 MG capsule Take 1 capsule (60 mg total) by mouth daily. TAKE 1 CAPSULE BY MOUTH EVERY DAY 90 capsule 1   gabapentin (NEURONTIN) 300 MG capsule TAKE 2 CAPSULES BY MOUTH 3 TIMES A DAY 540 capsule 3   OVER THE COUNTER MEDICATION Take 2 tablets by mouth daily. Energize Caffeine Pills Fast Acting Energy Pill     oxyCODONE (OXY IR/ROXICODONE) 5 MG immediate release tablet 1-2 tabs po q6h prn 90 tablet 0   pantoprazole (PROTONIX) 40 MG tablet Take 1 tablet (40 mg total) by mouth daily. 90 tablet 3   promethazine (PHENERGAN) 12.5 MG tablet 1-2 tabs po q6h prn nausea 30 tablet 0    sennosides-docusate sodium (SENOKOT-S) 8.6-50 MG tablet Take 1 tablet by mouth 2 (two) times daily. While taking pain meds to prevent constipation 30 tablet 0   Wheat Dextrin (BENEFIBER PO) Take by mouth.     albuterol (VENTOLIN HFA) 108 (90 Base) MCG/ACT inhaler Inhale 2 puffs into the lungs every 6 (six) hours as needed for wheezing or shortness of breath. (Patient not taking: Reported on 12/24/2021) 1 Inhaler 0   diphenhydramine-acetaminophen (TYLENOL PM) 25-500 MG TABS Take 1 tablet by mouth at bedtime as needed. (Patient not taking: Reported on 03/19/2022)     Multiple Vitamin (MULTIVITAMIN WITH MINERALS) TABS Take 1 tablet by mouth daily. (Patient not taking: Reported on 12/24/2021)     No facility-administered medications prior to visit.    Allergies  Allergen Reactions   Fenofibrate Rash   Codeine Nausea Only   Dilaudid [Hydromorphone Hcl] Nausea And Vomiting   PE;    03/19/2022    8:14 AM 01/21/2022    3:58 PM 01/21/2022    3:50 PM  Vitals with BMI  Height '5\' 2"'$     Weight 190 lbs    BMI 02.58    Systolic [phone]  Diastolic 61 58 60  Pulse 76 68 70    VS: noted--normal. Gen: alert, NAD, NONTOXIC APPEARING. HEENT: eyes without injection, drainage, or swelling.  Ears: EACs clear, TMs with normal light reflex and landmarks.  Nose: Clear rhinorrhea, with some dried, crusty exudate adherent to mildly injected mucosa.  No purulent d/c.  No paranasal sinus TTP.  No facial swelling.  Throat and mouth without focal lesion.  No pharyngial swelling, erythema, or exudate.   Neck: supple, no LAD.  KNEES: Bilat diffuse bony hypertrophy, mild diffuse TTP and crepitus and painful ROM. No erythema or warmth. No effusion.   Pertinent labs:  Lab Results  Component Value Date   TSH 0.93 06/17/2021   Lab Results  Component Value Date   WBC 5.4 06/17/2021   HGB 12.7 06/17/2021   HCT 38.7 06/17/2021   MCV 92.6 06/17/2021   PLT 263.0 06/17/2021   Lab Results  Component Value  Date   CREATININE 1.04 12/17/2021   BUN 10 12/17/2021   NA 139 12/17/2021   K 4.7 12/17/2021   CL 105 12/17/2021   CO2 23 12/17/2021   Lab Results  Component Value Date   ALT 38 (H) 12/17/2021   AST 31 12/17/2021   ALKPHOS 73 06/17/2021   BILITOT 0.5 12/17/2021   Lab Results  Component Value Date   CHOL 183 12/17/2021   Lab Results  Component Value Date   HDL 69 12/17/2021   Lab Results  Component Value Date   LDLCALC 81 12/17/2021   Lab Results  Component Value Date   TRIG 239 (H) 12/17/2021   Lab Results  Component Value Date   CHOLHDL 2.7 12/17/2021   Lab Results  Component Value Date   HGBA1C 5.8 06/17/2021   ASSESSMENT AND PLAN:   #1 acute sinusitis.  Suspect some allergic rhinitis contributing. Saline nasal moisturization discussed. Z-Pak.  # 2 chronic pain syndrome.  Fibromyalgia stable. Bilateral knee osteoarthritis pain moderate to severe, patient requests steroid injections today.  I did her left knee 3 months ago.  It has been quite a while since I did her right knee. Continue oxycodone 5 mg, 1-2 q6h prn, #90/mo.  Cont cymbalta and gabapentin. Ultrasound-guided injection is preferred based on studies that show increased duration, increased effect, greater accuracy, decreased procedural pain, increased response rate, and decreased cost with ultrasound-guided versus blind injection. Procedure: Real-time ultrasound guided injection of BOTH KNEES. Device: [employer] informed consent obtained.  Timeout conducted.  No overlying erythema, induration, or other signs of local infection. After sterile prep with Betadine, injected 3 cc of 1% lidocaine without epinephrine followed by a mix of 40 mg Kenalog and 3 cc of 1% plain lidocaine using 25-gauge 1-1/2 inch needle,  Injectate seen filling SPP. Patient tolerated the procedure well.  No immediate complications.  Post-injection care discussed. Advised to call if fever/chills, erythema, drainage, or  persistent bleeding. Impression: Technically successful ultrasound-guided injection to both knees SPP.  #3 hypertension, well controlled on Lotrel 10-40 daily. Lecture lites and creatinine today.  4.  Chronic renal insufficiency stage III. Avoid NSAIDs. Electrolytes and creatinine today.  An After Visit Summary was printed and given to the patient.  FOLLOW UP:  No follow-ups on file. Next cpe 06/2022  Signed:  Crissie Sickles, MD           03/19/2022

## 2022-04-15 ENCOUNTER — Other Ambulatory Visit: Payer: Self-pay | Admitting: Family Medicine

## 2022-04-15 NOTE — Telephone Encounter (Signed)
Pt needs a refill on Oxycodone.

## 2022-04-15 NOTE — Telephone Encounter (Signed)
Requesting: Oxycodone Contract: 06/04/21 UDS: 02/26/21 Last Visit: 03/19/22 Next Visit: 06/18/22 Last Refill: 03/13/22 (90,0)  Please Advise. Med pending

## 2022-04-16 MED ORDER — OXYCODONE HCL 5 MG PO TABS: ORAL_TABLET | ORAL | 0 refills | Status: DC

## 2022-04-16 NOTE — Telephone Encounter (Signed)
Pt advised refill sent. °

## 2022-04-23 DIAGNOSIS — Z13820 Encounter for screening for osteoporosis: Secondary | ICD-10-CM | POA: Diagnosis not present

## 2022-04-23 DIAGNOSIS — Z6834 Body mass index (BMI) 34.0-34.9, adult: Secondary | ICD-10-CM | POA: Diagnosis not present

## 2022-04-23 DIAGNOSIS — R69 Illness, unspecified: Secondary | ICD-10-CM | POA: Diagnosis not present

## 2022-04-23 DIAGNOSIS — Z01419 Encounter for gynecological examination (general) (routine) without abnormal findings: Secondary | ICD-10-CM | POA: Diagnosis not present

## 2022-04-23 DIAGNOSIS — Z1231 Encounter for screening mammogram for malignant neoplasm of breast: Secondary | ICD-10-CM | POA: Diagnosis not present

## 2022-04-23 DIAGNOSIS — N760 Acute vaginitis: Secondary | ICD-10-CM | POA: Diagnosis not present

## 2022-04-23 DIAGNOSIS — Z124 Encounter for screening for malignant neoplasm of cervix: Secondary | ICD-10-CM | POA: Diagnosis not present

## 2022-04-23 LAB — CBC AND DIFFERENTIAL: Hemoglobin: 13.1 (ref 12.0–16.0)

## 2022-04-23 LAB — HM MAMMOGRAPHY

## 2022-05-29 ENCOUNTER — Telehealth: Payer: Self-pay | Admitting: Family Medicine

## 2022-05-29 MED ORDER — OXYCODONE HCL 5 MG PO TABS: ORAL_TABLET | ORAL | 0 refills | Status: DC

## 2022-05-29 NOTE — Telephone Encounter (Signed)
Requesting: Oxycodone Contract: 06/04/21 UDS: 02/26/21 Last Visit: 03/19/22 Next Visit: 06/18/22 Last Refill: 04/16/22(90,0)  Please Advise. Med pending

## 2022-05-29 NOTE — Telephone Encounter (Signed)
LM for pt regarding medication ?

## 2022-05-29 NOTE — Telephone Encounter (Signed)
Pt is stating she needs a refill for her Oxycodone. She is scheduled for an appointment on 12/6

## 2022-05-30 NOTE — Telephone Encounter (Signed)
Patient returning call from yesterday about med refill request.

## 2022-05-30 NOTE — Telephone Encounter (Signed)
Pt advised refill sent. °

## 2022-06-07 ENCOUNTER — Other Ambulatory Visit: Payer: Self-pay | Admitting: Family Medicine

## 2022-06-10 ENCOUNTER — Telehealth: Payer: Self-pay

## 2022-06-10 MED ORDER — PHENTERMINE HCL 37.5 MG PO CAPS: 37.5000 mg | ORAL_CAPSULE | ORAL | 0 refills | Status: DC

## 2022-06-10 NOTE — Telephone Encounter (Signed)
Pt advised prescription sent to the pharmacy.

## 2022-06-10 NOTE — Telephone Encounter (Signed)
(  Pt has f/u set for 06/18/22)  Okay, phentermine prescription sent.

## 2022-06-10 NOTE — Telephone Encounter (Signed)
Patient request med for weight loss.  Patient stated Dr. Anitra Lauth said he would prescribe her medication when she was ready. She has checked with her insurance, they do not cover, but she is okay to fill and pay cash price, under $50.  Phentermine [facility]

## 2022-06-10 NOTE — Telephone Encounter (Signed)
Unable to find any mention of this being discussed.   Please Advise.

## 2022-06-17 ENCOUNTER — Telehealth: Payer: Self-pay

## 2022-06-17 NOTE — Telephone Encounter (Signed)
Pt appt changed to sick visit instead of [facility] - Client TELEPHONE ADVICE RECORD [facility] Patient Name: [name] Gender: Female DOB: Jul 19, 1955 Age: 66 Y 45 M 22 D Return Phone Number: [phone] (Primary) Address: City/ State/ Zip: [place] [address] Client [facility] - Client Client Site [place] - Day Provider Crissie Sickles - MD Contact Type Call Who Is Calling Patient / Member / Family / Caregiver Call Type Triage / Clinical Relationship To Patient Self Return Phone Number [phone] (Primary) Chief Complaint BREATHING - shortness of breath or sounds breathless Reason for Call Symptomatic / Request for [place] scheduled for a follow up on 12/06 but states she's needing to change to a sick visit, she has low grade fever and cough with headache and earache along with sore throat. Caller having shortness of breath [place] Not Listed [facility] Translation No Nurse Assessment Nurse: Ysidro Evert, RN, Levada Dy Date/Time (Eastern Time): 06/16/2022 12:12:57 PM Confirm and document reason for call. If symptomatic, describe symptoms. ---Caller states she has a cough and sore throat with a headache and she has shortness of breath d/t straining. She c/o an ear ache as well. Does the patient have any new or worsening symptoms? ---Yes Will a triage be completed? ---Yes Related visit to physician within the last 2 weeks? ---No Does the PT have any chronic conditions? (i.e. diabetes, asthma, this includes High risk factors for pregnancy, etc.) ---Yes List chronic conditions. ---pre diabetes Is this a behavioral health or substance abuse call? ---No Guidelines Guideline Title Affirmed Question Affirmed Notes Nurse Date/Time (Eastern Time) COVID-19 - Diagnosed or Suspected Chest pain or pressure (Exception: MILD central chest pain, present  only when coughing.) Ysidro Evert, RN, Levada Dy 06/16/2022 12:15:23 PM PLEASE NOTE: All timestamps contained within this report are represented as [place] Standard Time. CONFIDENTIALTY NOTICE: This fax transmission is intended only for the addressee. It contains information that is legally privileged, confidential or otherwise protected from use or disclosure. If you are not the intended recipient, you are strictly prohibited from reviewing, disclosing, copying using or disseminating any of this information or taking any action in reliance on or regarding this information. If you have received this fax in error, please notify us immediately by telephone so that we can arrange for its return to [place]. Phone: [phone], Toll-Free: [phone], Fax: [phone] Page: 2 of 2 Call Id: [id number] [place]. Time Eilene Ghazi Time) Disposition Final User 06/16/2022 12:11:16 PM Send to Urgent Daron Offer, [place] 06/16/2022 12:18:50 PM Go to ED Now (or PCP triage) Yes Ysidro Evert, RN, Levada Dy Final Disposition 06/16/2022 12:18:50 PM Go to ED Now (or PCP triage) Yes Ysidro Evert, RN, [place] Shutter Disagree/Comply Comply Caller Understands Yes PreDisposition Did not know what to do Care Advice Given Per Guideline GO TO ED NOW (OR PCP TRIAGE): * IF NO PCP (PRIMARY CARE PROVIDER) SECOND-LEVEL TRIAGE: You need to be seen within the next hour. Go to the [facility] at _____________ [place] as soon as you can. Referrals GO TO FACILITY OTHER - SPECIFY

## 2022-06-17 NOTE — Telephone Encounter (Signed)
Noted  

## 2022-06-18 ENCOUNTER — Ambulatory Visit: Payer: 59 | Admitting: Family Medicine

## 2022-07-02 ENCOUNTER — Other Ambulatory Visit: Payer: Self-pay | Admitting: Family Medicine

## 2022-07-02 MED ORDER — OXYCODONE HCL 5 MG PO TABS: ORAL_TABLET | ORAL | 0 refills | Status: DC

## 2022-07-02 NOTE — Telephone Encounter (Signed)
Requesting: Oxycodone Contract: 06/04/21 UDS: 02/26/21 Last Visit: 03/19/22 Next Visit: 07/09/22 Last Refill: 05/29/22(90,0)  Please Advise. Med pending

## 2022-07-02 NOTE — Telephone Encounter (Signed)
Pt advised refill sent. °

## 2022-07-02 NOTE — Telephone Encounter (Signed)
Pt is needing a refill on her Oxycodone.She has an upcoming appointment on  07/09/22.

## 2022-07-09 ENCOUNTER — Encounter: Payer: Self-pay | Admitting: Family Medicine

## 2022-07-09 ENCOUNTER — Ambulatory Visit: Payer: 59 | Admitting: Family Medicine

## 2022-07-09 ENCOUNTER — Other Ambulatory Visit: Payer: Self-pay | Admitting: Family Medicine

## 2022-07-09 VITALS — BP 118/70 | HR 79 | Temp 97.5°F | Ht 62.0 in | Wt 196.0 lb

## 2022-07-09 DIAGNOSIS — I10 Essential (primary) hypertension: Secondary | ICD-10-CM | POA: Diagnosis not present

## 2022-07-09 DIAGNOSIS — N1831 Chronic kidney disease, stage 3a: Secondary | ICD-10-CM

## 2022-07-09 DIAGNOSIS — G894 Chronic pain syndrome: Secondary | ICD-10-CM

## 2022-07-09 DIAGNOSIS — Z23 Encounter for immunization: Secondary | ICD-10-CM | POA: Diagnosis not present

## 2022-07-09 DIAGNOSIS — Z79899 Other long term (current) drug therapy: Secondary | ICD-10-CM | POA: Diagnosis not present

## 2022-07-09 DIAGNOSIS — M170 Bilateral primary osteoarthritis of knee: Secondary | ICD-10-CM

## 2022-07-09 DIAGNOSIS — E7800 Pure hypercholesterolemia, unspecified: Secondary | ICD-10-CM | POA: Diagnosis not present

## 2022-07-09 DIAGNOSIS — Z0000 Encounter for general adult medical examination without abnormal findings: Secondary | ICD-10-CM | POA: Diagnosis not present

## 2022-07-09 MED ORDER — ALBUTEROL SULFATE HFA 108 (90 BASE) MCG/ACT IN AERS: 2.0000 | INHALATION_SPRAY | Freq: Four times a day (QID) | RESPIRATORY_TRACT | 0 refills | Status: DC | PRN

## 2022-07-09 MED ORDER — MOUNJARO 2.5 MG/0.5ML ~~LOC~~ SOAJ: 2.5000 mg | SUBCUTANEOUS | 0 refills | Status: DC

## 2022-07-09 MED ORDER — PANTOPRAZOLE SODIUM 40 MG PO TBEC: 40.0000 mg | DELAYED_RELEASE_TABLET | Freq: Every day | ORAL | 1 refills | Status: DC

## 2022-07-09 MED ORDER — ATORVASTATIN CALCIUM 80 MG PO TABS: 80.0000 mg | ORAL_TABLET | Freq: Every day | ORAL | 1 refills | Status: DC

## 2022-07-09 MED ORDER — AMLODIPINE BESY-BENAZEPRIL HCL 10-40 MG PO CAPS: ORAL_CAPSULE | ORAL | 1 refills | Status: DC

## 2022-07-09 MED ORDER — TETANUS-DIPHTH-ACELL PERTUSSIS 5-2.5-18.5 LF-MCG/0.5 IM SUSP: 0.5000 mL | Freq: Once | INTRAMUSCULAR | 0 refills | Status: AC

## 2022-07-09 MED ORDER — DULOXETINE HCL 60 MG PO CPEP: 60.0000 mg | ORAL_CAPSULE | Freq: Every day | ORAL | 1 refills | Status: DC

## 2022-07-09 MED ORDER — OXYCODONE HCL 5 MG PO TABS: ORAL_TABLET | ORAL | 0 refills | Status: DC

## 2022-07-09 NOTE — Telephone Encounter (Signed)
[name] (Key: Sherald HessDarcel Bayley

## 2022-07-09 NOTE — Patient Instructions (Signed)

## 2022-07-09 NOTE — Progress Notes (Signed)
Office Note 07/09/2022  CC:  Chief Complaint  Patient presents with   Medical Management of Chronic Issues    Pt is fasting (little coffee with creamer)   Patient is a 66 y.o. female who is here for annual health maintenance exam and 33-monthfollow-up hypertension, chronic pain syndrome, and chronic renal insufficiency stage III. A/P as of last visit: "#1 acute sinusitis.  Suspect some allergic rhinitis contributing. Saline nasal moisturization discussed. Z-Pak.   # 2 chronic pain syndrome.  Fibromyalgia stable. Bilateral knee osteoarthritis pain moderate to severe, patient requests steroid injections today.  I did her left knee 3 months ago.  It has been quite a while since I did her right knee. Continue oxycodone 5 mg, 1-2 q6h prn, #90/mo.  Cont cymbalta and gabapentin.   #3 hypertension, well controlled on Lotrel 10-40 daily. Lecture lites and creatinine today.   4.  Chronic renal insufficiency stage III. Avoid NSAIDs. Electrolytes and creatinine today."  INTERIM HX: She continues to be very frustrated about her weight. [facility] She states she has an exercise class twice a week and tries to walk on other days, weather permitting.  Her chronic knee pain prevents vigorous activity. She is limiting calories some but admits she wants to get more strict with this. Phentermine use has not resulted in any significant appetite suppression and/or weight loss.   Chronic pain level is moderate and stable.  She states insurance does not cover the knee steroid injections so she does not want these anymore, although they did help her. Indication for chronic opioid: chronic lumbar and bilat knee osteoarthritis + fibromyalgia pain that I treat with pain meds in order to maximize functioning and quality of life.  Not NSAID candidate due to chronic renal insufficiency. She is also taking cymbalta '60mg'$  qd and gabapentin 600 mg nightly usually.  Also Flexeril pretty  regularly. PMP AWARE reviewed today: most recent rx for oxycodone was filled 07/02/2022, #90, prescription by me. Most recent prescription filled for gabapentin 07/03/2022, #180, prescription by me.  Most recent prescription for phentermine filled 06/10/2022, #30, prescription by me. No red flags.  Past Medical History:  Diagnosis Date   Allergy    Bilateral carpal tunnel syndrome    pt to get bilat carpal tunnel release as of 09/2018 ortho eval   Bilateral primary osteoarthritis of knee    L tricompart, R medial compart   BPPV (benign paroxysmal positional vertigo)    Cancer (HCC)    RIGHT KIDNEY   Cataract    Chronic pain syndrome    Chronic renal insufficiency, stage III (moderate) (HCC)    GFR 50's   Fibromyalgia    GERD (gastroesophageal reflux disease)    Heart murmur    Hemorrhoids    History of bronchitis    childhood, wood stove and coal heat   History of galactorrhea 02/05/2011   Hyperlipidemia, mixed    Hypertension    IFG (impaired fasting glucose) 2016   HbA1c 5.7-5.8.   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 MWyatt Magesign of recurrent dz as of this 05/2016 f/u.  Released from surveillance as of 06/03/16; return to urol prn.   Seasonal allergic rhinitis    Solitary left kidney 2013   Aquired: right nephrectomy for renal cancer    Past Surgical  History:  Procedure Laterality Date   CARPAL TUNNEL RELEASE Right 09/2018   CESAREAN SECTION  1983   COLONOSCOPY  04/2013   Normal 2014.  01/2022 normal. Recall 54yr d/t FH CRCA   DEXA     GYN scheduled this at 05/12/19 f/u.   ROBOT ASSISTED LAPAROSCOPIC NEPHRECTOMY  07/12/2012   Procedure: ROBOTIC ASSISTED LAPAROSCOPIC NEPHRECTOMY;  Surgeon: TAlexis Frock MD;  Location: [facility];  Service: Urology;  Laterality: Right;    TONSILLECTOMY     TUBAL LIGATION      Family History  Problem Relation Age of Onset   Thyroid disease Mother    Heart disease Mother        CHF, heart murmur, PAD   Kidney disease Mother        RAS   Other Mother 710      bowel obstruction,surgery this year   Colon cancer Father 312  Cancer Sister        cervical, breast   Thyroid disease Sister    Thyroid disease Sister    Diabetes Sister    Diabetes Sister    Hypertension Sister    Cancer Sister        kidney   Heart disease Brother    Cancer Paternal Aunt        breast cancer   Heart disease Maternal Grandmother    Thyroid disease Maternal Grandmother    Thyroid disease Maternal Grandfather    Cancer Maternal Grandfather    Diabetes Paternal Grandmother    Cancer Paternal Grandmother        breast   Heart disease Paternal Grandmother    Heart disease Paternal Grandfather    Diabetes Paternal Grandfather    Hypertension Paternal Grandfather    Asthma Daughter    Allergies Daughter    Lung disease Daughter        recurrent bronchitis   Lung disease Son        recurrent bronchitis   Colon cancer Paternal Great-grandmother    Crohn's disease Neg Hx    Esophageal cancer Neg Hx    Rectal cancer Neg Hx    Stomach cancer Neg Hx     Social History   Socioeconomic History   Marital status: Married    Spouse name: MJerrye Beavers  Number of children: 3   Years of education: Not on file   Highest education level: Not on file  Occupational History   Not on file  Tobacco Use   Smoking status: Former    Packs/day: 2.00    Years: 5.00    Total pack years: 10.00    Types: Cigarettes    Quit date: 07/14/1981    Years since quitting: 41.0    Passive exposure: Never   Smokeless tobacco: Never  Vaping Use   Vaping Use: Never used  Substance and Sexual Activity   Alcohol use: No   Drug use: No   Sexual activity: Yes  Other Topics Concern   Not on file  Social History Narrative   Married, 3 children.   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   No tobacco, alcohol, or drugs.  No exercise.   Social Determinants of Health   Financial Resource Strain: Not on file  Food Insecurity: Not on file  Transportation Needs: Not on file  Physical Activity: Not on file  Stress:  Not on file  Social Connections: Not on file  Intimate Partner Violence: Not on file    Outpatient Medications Prior to Visit  Medication Sig Dispense Refill   acetaminophen (TYLENOL) 500 MG tablet Take 1,000 mg by mouth every 8 (eight) hours as needed.     cyclobenzaprine (FLEXERIL) 10 MG tablet TAKE 1 TABLET BY MOUTH THREE TIMES A DAY AS NEEDED 90 tablet 5   diphenhydramine-acetaminophen (TYLENOL PM) 25-500 MG TABS Take 1 tablet by mouth at bedtime as needed.     gabapentin (NEURONTIN) 300 MG capsule TAKE 2 CAPSULES BY MOUTH 3 TIMES A DAY 540 capsule 3   Multiple Vitamin (MULTIVITAMIN WITH MINERALS) TABS Take 1 tablet by mouth daily.     OVER THE COUNTER MEDICATION Take 2 tablets by mouth daily. Energize Caffeine Pills Fast Acting Energy Pill     promethazine (PHENERGAN) 12.5 MG tablet 1-2 tabs po q6h prn nausea 30 tablet 0   sennosides-docusate sodium (SENOKOT-S) 8.6-50 MG tablet Take 1 tablet by mouth 2 (two) times daily. While taking pain meds to prevent constipation 30 tablet 0   Wheat Dextrin (BENEFIBER PO) Take by mouth.     oxyCODONE (OXY IR/ROXICODONE) 5 MG immediate release tablet 1-2 tabs po q6h prn 90 tablet 0   phentermine 37.5 MG capsule Take 1 capsule (37.5 mg total) by mouth every morning. 30 capsule 0   albuterol (VENTOLIN HFA) 108 (90 Base) MCG/ACT inhaler Inhale 2 puffs into the lungs every 6 (six) hours as needed for wheezing or shortness of breath. (Patient not taking: Reported on 12/24/2021) 1 Inhaler 0   amLODipine-benazepril (LOTREL) 10-40 MG capsule TAKE 1 CAPSULE BY MOUTH EVERY DAY  90 capsule 1   atorvastatin (LIPITOR) 80 MG tablet Take 1 tablet (80 mg total) by mouth daily. 90 tablet 1   azithromycin (ZITHROMAX) 250 MG tablet 2 tabs po qd x 1d, then 1 tab po qd x 4d 6 tablet 0   DULoxetine (CYMBALTA) 60 MG capsule Take 1 capsule (60 mg total) by mouth daily. TAKE 1 CAPSULE BY MOUTH EVERY DAY 90 capsule 1   pantoprazole (PROTONIX) 40 MG tablet Take 1 tablet (40 mg total) by mouth daily. 90 tablet 3   No facility-administered medications prior to visit.    Allergies  Allergen Reactions   Fenofibrate Rash   Codeine Nausea Only   Dilaudid [Hydromorphone Hcl] Nausea And Vomiting    Review of Systems  Constitutional:  Negative for appetite change, chills, fatigue and fever.  HENT:  Negative for congestion, dental problem, ear pain and sore throat.   Eyes:  Negative for discharge, redness and visual disturbance.  Respiratory:  Negative for cough, chest tightness, shortness of breath and wheezing.   Cardiovascular:  Negative for chest pain, palpitations and leg swelling.  Gastrointestinal:  Negative for abdominal pain, blood in stool, diarrhea, nausea and vomiting.  Genitourinary:  Negative for difficulty urinating, dysuria, flank pain, frequency, hematuria and urgency.  Musculoskeletal:  Positive for arthralgias (chronic diffuse) and myalgias (chronic diffuse). Negative for back pain, joint swelling and neck stiffness.  Skin:  Negative for pallor and rash.  Neurological:  Negative for dizziness, speech difficulty, weakness and headaches.  Hematological:  Negative for adenopathy. Does not bruise/bleed easily.  Psychiatric/Behavioral:  Negative for confusion and sleep disturbance. The patient is not nervous/anxious.    PE;    07/09/2022   10:30 AM 03/19/2022    8:14 AM 01/21/2022    3:58 PM  Vitals with BMI  Height '5\' 2"'$   $'5\' 2"'w$    Weight 196 lbs 190 lbs   BMI 78.24 23.53   Systolic 614 431 540  Diastolic 70 61 58  Pulse 79 76 68   Exam chaperoned by Deveron Furlong, CMA.   Gen: Alert, well appearing.  Patient is oriented to person, place, time, and situation. AFFECT: pleasant, lucid thought and speech. ENT: Ears: EACs clear, normal epithelium.  TMs with good light reflex and landmarks bilaterally.  Eyes: no injection, icteris, swelling, or exudate.  EOMI, PERRLA. Nose: no drainage or turbinate edema/swelling.  No injection or focal lesion.  Mouth: lips without lesion/swelling.  Oral mucosa pink and moist.  Dentition intact and without obvious caries or gingival swelling.  Oropharynx without erythema, exudate, or swelling.  Neck: supple/nontender.  No LAD, mass, or TM.  Carotid pulses 2+ bilaterally, without bruits. CV: RRR, no m/r/g.   LUNGS: CTA bilat, nonlabored resps, good aeration in all lung fields. ABD: soft, NT, ND, BS normal.  No hepatospenomegaly or mass.  No bruits. EXT: no clubbing, cyanosis, or edema.  Musculoskeletal: no joint swelling, erythema, warmth, or tenderness.  ROM of all joints intact. Skin - no sores or suspicious lesions or rashes or color changes  Pertinent labs:  Lab Results  Component Value Date   TSH 0.93 06/17/2021   Lab Results  Component Value Date   WBC 5.4 06/17/2021   HGB 13.1 04/23/2022   HCT 38.7 06/17/2021   MCV 92.6 06/17/2021   PLT 263.0 06/17/2021   Lab Results  Component Value Date   CREATININE 1.19 03/19/2022   BUN 13 03/19/2022   NA 137 03/19/2022   K 4.4 03/19/2022   CL 102 03/19/2022   CO2 25 03/19/2022   Lab Results  Component Value Date   ALT 38 (H) 12/17/2021   AST 31 12/17/2021   ALKPHOS 73 06/17/2021   BILITOT 0.5 12/17/2021   Lab Results  Component Value Date   CHOL 183 12/17/2021   Lab Results  Component Value Date   HDL 69 12/17/2021   Lab Results  Component Value Date   LDLCALC 81 12/17/2021   Lab Results  Component Value Date   TRIG 239 (H) 12/17/2021   Lab Results  Component Value Date   CHOLHDL 2.7 12/17/2021   Lab Results  Component Value Date    HGBA1C 5.8 06/17/2021   ASSESSMENT AND PLAN:   #1 health maintenance exam: Reviewed age and gender appropriate health maintenance issues (prudent diet, regular exercise, health risks of tobacco and excessive alcohol, use of seatbelts, fire alarms in home, use of sunscreen).  Also reviewed age and gender appropriate health screening as well as vaccine recommendations. Vaccines: Tdap booster->rx to pharmacy today.  O/w all UTD. Labs: cbc, lipids, cmet Cervical ca screening: UTD Per GYN, Dr. Matthew Saras. Breast ca screening: UTD Per GYN, Dr. Matthew Saras. DEXA: UTD per gyn Colon ca screening: recall 01/2027  #2 chronic pain syndrome.  Fibromyalgia plus osteoarthritis of multiple sites. She cannot afford the knee steroid injections--insurance does not cover. Will continue with oxycodone 5 mg, 1-2 every 6 hours as needed, #90. Controlled substance contract updated. Urine drug screen today.  3.  Hypertension, well-controlled on amlodipine-benazepril 10-40, 1 cap daily. Electrolytes and creatinine today.  4.  Obesity, weight loss management. BMI is 36. She wants to try to pay out-of-pocket for [facility], start 2.5 mg subcu every week x 4. Recheck in 1 month to see if we can push the dose. Therapeutic expectations and side effect profile of medication  discussed today.  Patient's questions answered. Stop phentermine.  #5 chronic renal insufficiency. Avoid NSAIDs. Electrolytes and creatinine monitoring today.  An After Visit Summary was printed and given to the patient.  FOLLOW UP:  Return in about 3 months (around 10/08/2022) for routine chronic illness f/u.  Signed:  Crissie Sickles, MD           07/09/2022

## 2022-07-10 LAB — CBC
HCT: 40.5 % (ref 35.0–45.0)
Hemoglobin: 14 g/dL (ref 11.7–15.5)
MCH: 31.7 pg (ref 27.0–33.0)
MCHC: 34.6 g/dL (ref 32.0–36.0)
MCV: 91.8 fL (ref 80.0–100.0)
MPV: 10.8 fL (ref 7.5–12.5)
Platelets: 310 10*3/uL (ref 140–400)
RBC: 4.41 10*6/uL (ref 3.80–5.10)
RDW: 12.6 % (ref 11.0–15.0)
WBC: 8.9 10*3/uL (ref 3.8–10.8)

## 2022-07-10 LAB — COMPREHENSIVE METABOLIC PANEL
AG Ratio: 1.6 (calc) (ref 1.0–2.5)
ALT: 34 U/L — ABNORMAL HIGH (ref 6–29)
AST: 34 U/L (ref 10–35)
Albumin: 4.4 g/dL (ref 3.6–5.1)
Alkaline phosphatase (APISO): 95 U/L (ref 37–153)
BUN: 14 mg/dL (ref 7–25)
CO2: 28 mmol/L (ref 20–32)
Calcium: 9.5 mg/dL (ref 8.6–10.4)
Chloride: 102 mmol/L (ref 98–110)
Creat: 0.94 mg/dL (ref 0.50–1.05)
Globulin: 2.7 g/dL (calc) (ref 1.9–3.7)
Glucose, Bld: 91 mg/dL (ref 65–99)
Potassium: 4.7 mmol/L (ref 3.5–5.3)
Sodium: 141 mmol/L (ref 135–146)
Total Bilirubin: 0.6 mg/dL (ref 0.2–1.2)
Total Protein: 7.1 g/dL (ref 6.1–8.1)

## 2022-07-10 LAB — LIPID PANEL
Cholesterol: 248 mg/dL — ABNORMAL HIGH (ref <200)
HDL: 110 mg/dL (ref >=50)
LDL Cholesterol (Calc): 110 mg/dL (calc) — ABNORMAL HIGH
Non-HDL Cholesterol (Calc): 138 mg/dL (calc) — ABNORMAL HIGH (ref <130)
Total CHOL/HDL Ratio: 2.3 (calc) (ref <5.0)
Triglycerides: 160 mg/dL — ABNORMAL HIGH (ref <150)

## 2022-07-11 LAB — DM TEMPLATE

## 2022-07-11 LAB — DRUG MONITORING PANEL 376104, URINE
Amphetamines: NEGATIVE ng/mL (ref <500)
Barbiturates: NEGATIVE ng/mL (ref <300)
Benzodiazepines: NEGATIVE ng/mL (ref <100)
Cocaine Metabolite: NEGATIVE ng/mL (ref <150)
Codeine: NEGATIVE ng/mL (ref <50)
Desmethyltramadol: NEGATIVE ng/mL (ref <100)
Hydrocodone: NEGATIVE ng/mL (ref <50)
Hydromorphone: NEGATIVE ng/mL (ref <50)
Morphine: NEGATIVE ng/mL (ref <50)
Norhydrocodone: NEGATIVE ng/mL (ref <50)
Noroxycodone: 2330 ng/mL — ABNORMAL HIGH (ref <50)
Opiates: NEGATIVE ng/mL (ref <100)
Oxycodone: 793 ng/mL — ABNORMAL HIGH (ref <50)
Oxycodone: POSITIVE ng/mL — AB (ref <100)
Oxymorphone: 810 ng/mL — ABNORMAL HIGH (ref <50)
Tramadol: NEGATIVE ng/mL (ref <100)

## 2022-07-11 NOTE — Telephone Encounter (Signed)
PA denied for coverage.   Please Advise.

## 2022-07-24 ENCOUNTER — Ambulatory Visit: Payer: 59 | Admitting: Family Medicine

## 2022-07-24 ENCOUNTER — Encounter: Payer: Self-pay | Admitting: Family Medicine

## 2022-07-24 VITALS — BP 125/72 | HR 78 | Temp 97.7°F | Wt 196.8 lb

## 2022-07-24 DIAGNOSIS — M1712 Unilateral primary osteoarthritis, left knee: Secondary | ICD-10-CM | POA: Diagnosis not present

## 2022-07-24 DIAGNOSIS — M25562 Pain in left knee: Secondary | ICD-10-CM | POA: Diagnosis not present

## 2022-07-24 DIAGNOSIS — E669 Obesity, unspecified: Secondary | ICD-10-CM | POA: Diagnosis not present

## 2022-07-24 DIAGNOSIS — E66812 Obesity, class 2: Secondary | ICD-10-CM

## 2022-07-24 MED ORDER — TRIAMCINOLONE ACETONIDE 40 MG/ML IJ SUSP
40.0000 mg | Freq: Once | INTRAMUSCULAR | Status: AC
  Administered 2022-07-24: 40 mg via INTRAMUSCULAR

## 2022-07-24 MED ORDER — PHENTERMINE HCL 37.5 MG PO CAPS: 37.5000 mg | ORAL_CAPSULE | ORAL | 5 refills | Status: DC

## 2022-07-24 NOTE — Progress Notes (Signed)
Y OFFICE VISIT  07/24/2022  CC:  Chief Complaint  Patient presents with   Knee Pain    Both knees but would like to focus on left knee due to finance     Patient is a 67 y.o. female who presents for left knee pain.  HPI: Ongoing severe left knee pain, worse the last several weeks. No injury. Has documented bilateral knee osteoarthritis.  Currently using 2 oxycodone every 4-6 hours lately, which is more than her typical. Taking muscle relaxer as well.  Most recent knee injections(both sides):  03/19/22.  Past Medical History:  Diagnosis Date   Allergy    Bilateral carpal tunnel syndrome    pt to get bilat carpal tunnel release as of 09/2018 ortho eval   Bilateral primary osteoarthritis of knee    L tricompart, R medial compart   BPPV (benign paroxysmal positional vertigo)    Cancer (HCC)    RIGHT KIDNEY   Cataract    Chronic pain syndrome    Chronic renal insufficiency, stage III (moderate) (HCC)    GFR 50's   Fibromyalgia    GERD (gastroesophageal reflux disease)    Heart murmur    Hemorrhoids    History of bronchitis    childhood, wood stove and coal heat   History of galactorrhea 02/05/2011   Hyperlipidemia, mixed    Hypertension    IFG (impaired fasting glucose) 2016   HbA1c 5.7-5.8.   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 Wyatt Mage sign of recurrent dz as of this 05/2016 f/u.  Released from surveillance as of 06/03/16; return to urol prn.   Seasonal allergic rhinitis    Solitary left kidney 2013   Aquired: right nephrectomy for renal cancer    Past Surgical History:  Procedure Laterality Date   CARPAL TUNNEL RELEASE Right 09/2018   CESAREAN SECTION  1983   COLONOSCOPY  04/2013   Normal 2014.  01/2022 normal. Recall 110yr d/t FH CRCA   DEXA     GYN scheduled  this at 05/12/19 f/u.   ROBOT ASSISTED LAPAROSCOPIC NEPHRECTOMY  07/12/2012   Procedure: ROBOTIC ASSISTED LAPAROSCOPIC NEPHRECTOMY;  Surgeon: TAlexis Frock MD;  Location: [facility];  Service: Urology;  Laterality: Right;   TONSILLECTOMY     TUBAL LIGATION      Outpatient Medications Prior to Visit  Medication Sig Dispense Refill   acetaminophen (TYLENOL) 500 MG tablet Take 1,000 mg by mouth every 8 (eight) hours as needed.     albuterol (VENTOLIN HFA) 108 (90 Base) MCG/ACT inhaler Inhale 2 puffs into the lungs every 6 (six) hours as needed for wheezing or shortness of breath. 1 each 0   amLODipine-benazepril (LOTREL) 10-40 MG capsule TAKE 1 CAPSULE BY MOUTH EVERY DAY 90 capsule 1   atorvastatin (LIPITOR) 80 MG tablet Take 1 tablet (80 mg total) by mouth daily. 90 tablet 1   cyclobenzaprine (FLEXERIL) 10 MG tablet TAKE 1 TABLET BY MOUTH THREE TIMES A DAY AS NEEDED 90 tablet 5   diphenhydramine-acetaminophen (TYLENOL PM) 25-500 MG TABS Take 1 tablet by mouth at bedtime as needed.     Dulaglutide (TRULICITY) 01.09MNA/3.5TDSOPN Inject 0.75 mg into the skin once a week. 2 mL 0   DULoxetine (CYMBALTA) 60 MG capsule Take 1 capsule (60 mg total) by mouth daily. TAKE 1 CAPSULE BY  MOUTH EVERY DAY 90 capsule 1   gabapentin (NEURONTIN) 300 MG capsule TAKE 2 CAPSULES BY MOUTH 3 TIMES A DAY 540 capsule 3   Multiple Vitamin (MULTIVITAMIN WITH MINERALS) TABS Take 1 tablet by mouth daily.     OVER THE COUNTER MEDICATION Take 2 tablets by mouth daily. Energize Caffeine Pills Fast Acting Energy Pill     oxyCODONE (OXY IR/ROXICODONE) 5 MG immediate release tablet 1-2 tabs po q6h prn 90 tablet 0   pantoprazole (PROTONIX) 40 MG tablet Take 1 tablet (40 mg total) by mouth daily. 90 tablet 1   promethazine (PHENERGAN) 12.5 MG tablet 1-2 tabs po q6h prn nausea 30 tablet 0   sennosides-docusate sodium (SENOKOT-S) 8.6-50 MG tablet Take 1 tablet by mouth 2 (two) times daily. While taking pain meds to prevent constipation  30 tablet 0   Wheat Dextrin (BENEFIBER PO) Take by mouth.     No facility-administered medications prior to visit.    Allergies  Allergen Reactions   Fenofibrate Rash   Codeine Nausea Only   Dilaudid [Hydromorphone Hcl] Nausea And Vomiting    Review of Systems  As per HPI  PE:    07/24/2022    2:55 PM 07/09/2022   10:30 AM 03/19/2022    8:14 AM  Vitals with BMI  Height  '5\' 2"'$  '5\' 2"'$   Weight 196 lbs 13 oz 196 lbs 190 lbs  BMI 35.99 59.16 38.46  Systolic 659 935 701  Diastolic 72 70 61  Pulse 78 79 76    Physical Exam  Gen: Alert, well appearing.  Patient is oriented to person, place, time, and situation. AFFECT: pleasant, lucid thought and speech. L knee: Mild to moderate effusion noted.  TTP diffusely, particularly medially.  Active and passive range of motion limited to 90 degrees.  She can extend to 180. No erythema, no warmth.  LABS:  Last CBC Lab Results  Component Value Date   WBC 8.9 07/09/2022   HGB 14.0 07/09/2022   HCT 40.5 07/09/2022   MCV 91.8 07/09/2022   MCH 31.7 07/09/2022   RDW 12.6 07/09/2022   PLT 310 77/93/9030   Last metabolic panel Lab Results  Component Value Date   GLUCOSE 91 07/09/2022   NA 141 07/09/2022   K 4.7 07/09/2022   CL 102 07/09/2022   CO2 28 07/09/2022   BUN 14 07/09/2022   CREATININE 0.94 07/09/2022   EGFR 62 10/22/2020   CALCIUM 9.5 07/09/2022   PHOS 3.5 02/04/2012   PROT 7.1 07/09/2022   ALBUMIN 4.6 06/17/2021   BILITOT 0.6 07/09/2022   ALKPHOS 73 06/17/2021   AST 34 07/09/2022   ALT 34 (H) 07/09/2022   IMPRESSION AND PLAN:  Left knee osteoarthritis, severe. She wants to continue with periodic knee steroid injections. Of note, the most injection we did in the left knee only helped her for about 2 weeks.  She wants to give another try.  Ultrasound-guided injection is preferred based on studies that show increased duration, increased effect, greater accuracy, decreased procedural pain, increased response rate,  and decreased cost with ultrasound-guided versus blind injection. Procedure: Real-time ultrasound guided injection of left knee. Device: [employer] informed consent obtained.  Timeout conducted.  No overlying erythema, induration, or other signs of local infection. After sterile prep with Betadine, injected 3 cc of 2% lidocaine without epinephrine followed by a mixture of 40 mg Depo-Medrol and 3 cc of 2% lidocaine without epi. injectate seen filling suprapatellar pouch.. Patient tolerated the procedure well.  No immediate complications.  Post-injection care discussed. Advised to call if fever/chills, erythema, drainage, or persistent bleeding.  Impression: Technically successful ultrasound-guided injection.  #2 obesity. Phentermine helped in the past but we recently tried to switch over to a GLP-1 agonist. Insurance would not cover. Will restart phentermine 37.5 mg a day.  An After Visit Summary was printed and given to the patient.  FOLLOW UP: Return if symptoms worsen or fail to improve.  Signed:  Crissie Sickles, MD           07/24/2022

## 2022-07-24 NOTE — Addendum Note (Signed)
Addended by: Beatrix Fetters on: 07/24/2022 03:55 PM   Modules accepted: Orders

## 2022-08-02 ENCOUNTER — Other Ambulatory Visit: Payer: Self-pay | Admitting: Family Medicine

## 2022-08-04 ENCOUNTER — Other Ambulatory Visit: Payer: Self-pay | Admitting: Family Medicine

## 2022-08-04 MED ORDER — OXYCODONE HCL 5 MG PO TABS: ORAL_TABLET | ORAL | 0 refills | Status: DC

## 2022-08-04 NOTE — Telephone Encounter (Signed)
Pt is needing refill on 5 mg Oxycodone. Pharmacy is confirmed as [facility] in [place]

## 2022-08-04 NOTE — Telephone Encounter (Signed)
Requesting: Oxycodone '5mg'$  Contract: 07/08/22 UDS: 06/29/22 Last Visit: 07/24/22 Next Visit: 10/08/22 Last Refill: 07/09/22 (90,0)  Please Advise. Med pending

## 2022-09-01 ENCOUNTER — Other Ambulatory Visit: Payer: Self-pay | Admitting: Family Medicine

## 2022-09-04 ENCOUNTER — Other Ambulatory Visit: Payer: Self-pay | Admitting: Family Medicine

## 2022-09-04 NOTE — Telephone Encounter (Signed)
Requesting: oxycodone Contract: 07/08/22 UDS: 06/29/22 Last Visit: 07/24/22 Next Visit: 10/08/22 Last Refill: 08/04/22 (90,0)  RF request for phentermine LOV: 07/24/22 Next ov:  10/08/22 Last written: 07/24/22 (30,5) no refill needed  Only 1 med pending

## 2022-09-04 NOTE — Telephone Encounter (Signed)
Pt needs refill on two medications. oxyCODONE (OXY IR/ROXICODONE) 5 MG immediate release tablet    phentermine 37.5 MG capsule    Pharmacy in the system is correct as [facility] [place]

## 2022-09-05 ENCOUNTER — Other Ambulatory Visit: Payer: Self-pay | Admitting: Family Medicine

## 2022-09-05 MED ORDER — PHENTERMINE HCL 37.5 MG PO CAPS: 37.5000 mg | ORAL_CAPSULE | ORAL | 5 refills | Status: DC

## 2022-09-05 MED ORDER — OXYCODONE HCL 5 MG PO TABS: ORAL_TABLET | ORAL | 0 refills | Status: DC

## 2022-09-25 ENCOUNTER — Ambulatory Visit: Payer: 59 | Admitting: Family Medicine

## 2022-09-25 NOTE — Progress Notes (Deleted)
OFFICE VISIT  09/25/2022  CC: No chief complaint on file.   Patient is a 67 y.o. female who presents for sinus pressure.  HPI: ***  Past Medical History:  Diagnosis Date   Allergy    Bilateral carpal tunnel syndrome    pt to get bilat carpal tunnel release as of 09/2018 ortho eval   Bilateral primary osteoarthritis of knee    L tricompart, R medial compart   BPPV (benign paroxysmal positional vertigo)    Cancer (HCC)    RIGHT KIDNEY   Cataract    Chronic pain syndrome    Chronic renal insufficiency, stage III (moderate) (HCC)    GFR 50's   Fibromyalgia    GERD (gastroesophageal reflux disease)    Heart murmur    Hemorrhoids    History of bronchitis    childhood, wood stove and coal heat   History of galactorrhea 02/05/2011   Hyperlipidemia, mixed    Hypertension    IFG (impaired fasting glucose) 2016   HbA1c 5.7-5.8.   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 Wyatt Mage sign of recurrent dz as of this 05/2016 f/u.  Released from surveillance as of 06/03/16; return to urol prn.   Seasonal allergic rhinitis    Solitary left kidney 2013   Aquired: right nephrectomy for renal cancer    Past Surgical History:  Procedure Laterality Date   CARPAL TUNNEL RELEASE Right 09/2018   CESAREAN SECTION  1983   COLONOSCOPY  04/2013   Normal 2014.  01/2022 normal. Recall 14yr d/t FH CRCA   DEXA     GYN scheduled this at 05/12/19 f/u.   ROBOT ASSISTED LAPAROSCOPIC NEPHRECTOMY  07/12/2012   Procedure: ROBOTIC ASSISTED LAPAROSCOPIC NEPHRECTOMY;  Surgeon: TAlexis Frock MD;  Location: [facility];  Service: Urology;  Laterality: Right;   TONSILLECTOMY     TUBAL LIGATION      Outpatient Medications Prior to Visit  Medication Sig Dispense Refill   acetaminophen (TYLENOL) 500 MG tablet Take  1,000 mg by mouth every 8 (eight) hours as needed.     albuterol (VENTOLIN HFA) 108 (90 Base) MCG/ACT inhaler INHALE 2 PUFFS INTO THE LUNGS EVERY 6 HOURS AS NEEDED FOR WHEEZE OR SHORTNESS OF BREATH 18 g 0   amLODipine-benazepril (LOTREL) 10-40 MG capsule TAKE 1 CAPSULE BY MOUTH EVERY DAY 90 capsule 1   atorvastatin (LIPITOR) 80 MG tablet Take 1 tablet (80 mg total) by mouth daily. 90 tablet 1   cyclobenzaprine (FLEXERIL) 10 MG tablet TAKE 1 TABLET BY MOUTH THREE TIMES A DAY AS NEEDED 90 tablet 5   diphenhydramine-acetaminophen (TYLENOL PM) 25-500 MG TABS Take 1 tablet by mouth at bedtime as needed.     Dulaglutide (TRULICITY) [id number] Inject 0.75 mg into the skin once a week. 2 mL 0   DULoxetine (CYMBALTA) 60 MG capsule Take 1 capsule (60 mg total) by mouth daily. TAKE 1 CAPSULE BY MOUTH EVERY DAY 90 capsule 1   gabapentin (NEURONTIN) 300 MG capsule TAKE 2 CAPSULES BY MOUTH 3 TIMES A DAY 540 capsule 3   Multiple Vitamin (MULTIVITAMIN WITH MINERALS) TABS Take 1 tablet by mouth daily.     OVER THE COUNTER MEDICATION Take 2 tablets by mouth daily. Energize Caffeine Pills Fast Acting Energy Pill     oxyCODONE (OXY IR/ROXICODONE) 5 MG immediate release  tablet 1-2 tabs po q6h prn 90 tablet 0   pantoprazole (PROTONIX) 40 MG tablet Take 1 tablet (40 mg total) by mouth daily. 90 tablet 1   phentermine 37.5 MG capsule Take 1 capsule (37.5 mg total) by mouth every morning. 30 capsule 5   promethazine (PHENERGAN) 12.5 MG tablet 1-2 tabs po q6h prn nausea 30 tablet 0   sennosides-docusate sodium (SENOKOT-S) 8.6-50 MG tablet Take 1 tablet by mouth 2 (two) times daily. While taking pain meds to prevent constipation 30 tablet 0   Wheat Dextrin (BENEFIBER PO) Take by mouth.     No facility-administered medications prior to visit.    Allergies  Allergen Reactions   Fenofibrate Rash   Codeine Nausea Only   Dilaudid [Hydromorphone Hcl] Nausea And Vomiting    Review of Systems  As per  HPI  PE:    07/24/2022    2:55 PM 07/09/2022   10:30 AM 03/19/2022    8:14 AM  Vitals with BMI  Height  '5\' 2"'$  '5\' 2"'$   Weight 196 lbs 13 oz 196 lbs 190 lbs  BMI 35.99 99991111 [id number]  Systolic 0000000 123456 123456  Diastolic 72 70 61  Pulse 78 79 76     Physical Exam  ***  LABS:  Last metabolic panel Lab Results  Component Value Date   GLUCOSE 91 07/09/2022   NA 141 07/09/2022   K 4.7 07/09/2022   CL 102 07/09/2022   CO2 28 07/09/2022   BUN 14 07/09/2022   CREATININE 0.94 07/09/2022   EGFR 62 10/22/2020   CALCIUM 9.5 07/09/2022   PHOS 3.5 02/04/2012   PROT 7.1 07/09/2022   ALBUMIN 4.6 06/17/2021   BILITOT 0.6 07/09/2022   ALKPHOS 73 06/17/2021   AST 34 07/09/2022   ALT 34 (H) 07/09/2022   IMPRESSION AND PLAN:  No problem-specific Assessment & Plan notes found for this encounter.   An After Visit Summary was printed and given to the patient.  FOLLOW UP: No follow-ups on file.  Signed:  Crissie Sickles, MD           09/25/2022

## 2022-09-29 ENCOUNTER — Ambulatory Visit (INDEPENDENT_AMBULATORY_CARE_PROVIDER_SITE_OTHER): Payer: 59 | Admitting: Family Medicine

## 2022-09-29 ENCOUNTER — Encounter: Payer: Self-pay | Admitting: Family Medicine

## 2022-09-29 VITALS — BP 105/61 | HR 76 | Temp 97.6°F | Ht 62.0 in | Wt 192.4 lb

## 2022-09-29 DIAGNOSIS — M170 Bilateral primary osteoarthritis of knee: Secondary | ICD-10-CM | POA: Diagnosis not present

## 2022-09-29 DIAGNOSIS — J019 Acute sinusitis, unspecified: Secondary | ICD-10-CM | POA: Diagnosis not present

## 2022-09-29 DIAGNOSIS — J209 Acute bronchitis, unspecified: Secondary | ICD-10-CM | POA: Diagnosis not present

## 2022-09-29 MED ORDER — AMOXICILLIN 875 MG PO TABS: 875.0000 mg | ORAL_TABLET | Freq: Two times a day (BID) | ORAL | 0 refills | Status: AC

## 2022-09-29 MED ORDER — DEXTROMETHORPHAN-GUAIFENESIN 5-100 MG PO PACK: PACK | ORAL | 1 refills | Status: DC

## 2022-09-29 MED ORDER — PREDNISONE 20 MG PO TABS: ORAL_TABLET | ORAL | 0 refills | Status: DC

## 2022-09-29 NOTE — Progress Notes (Signed)
OFFICE VISIT  09/29/2022  CC:  Chief Complaint  Patient presents with   Cough    X 1 month, productive cough; green mucus; has been using Mucinex otc. Pt also c/o head, nasal, chest congestion, ears popping    Patient is a 67 y.o. female who presents for "sinus issues"  HPI: Approximately 1 month of waxing and waning nasal congestion/sinus pressure, sinus headaches, and now recent right ear pain.  Cough has been a part of this as well, productive of green mucus, feels like she is somewhat tight and occasionally wheezing. No fever.  Eating and drinking okay.  No shortness of breath, no chest pain. No nausea, vomiting, or diarrhea. No rash, no eye drainage, no sore throat.  Past Medical History:  Diagnosis Date   Allergy    Bilateral carpal tunnel syndrome    pt to get bilat carpal tunnel release as of 09/2018 ortho eval   Bilateral primary osteoarthritis of knee    L tricompart, R medial compart   BPPV (benign paroxysmal positional vertigo)    Cancer (HCC)    RIGHT KIDNEY   Cataract    Chronic pain syndrome    Chronic renal insufficiency, stage III (moderate) (HCC)    GFR 50's   Fibromyalgia    GERD (gastroesophageal reflux disease)    Heart murmur    Hemorrhoids    History of bronchitis    childhood, wood stove and coal heat   History of galactorrhea 02/05/2011   Hyperlipidemia, mixed    Hypertension    IFG (impaired fasting glucose) 2016   HbA1c 5.7-5.8.   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 Wyatt Mage sign of recurrent dz as of this 05/2016 f/u.  Released from surveillance as of 06/03/16; return to urol prn.   Seasonal allergic rhinitis    Solitary left kidney 2013   Aquired: right nephrectomy for renal cancer    Past Surgical History:  Procedure Laterality Date    CARPAL TUNNEL RELEASE Right 09/2018   CESAREAN SECTION  1983   COLONOSCOPY  04/2013   Normal 2014.  01/2022 normal. Recall 57yrs d/t FH CRCA   DEXA     GYN scheduled this at 05/12/19 f/u.   ROBOT ASSISTED LAPAROSCOPIC NEPHRECTOMY  07/12/2012   Procedure: ROBOTIC ASSISTED LAPAROSCOPIC NEPHRECTOMY;  Surgeon: Alexis Frock, MD;  Location: [facility];  Service: Urology;  Laterality: Right;   TONSILLECTOMY     TUBAL LIGATION      Outpatient Medications Prior to Visit  Medication Sig Dispense Refill   acetaminophen (TYLENOL) 500 MG tablet Take 1,000 mg by mouth every 8 (eight) hours as needed.     albuterol (VENTOLIN HFA) 108 (90 Base) MCG/ACT inhaler INHALE 2 PUFFS INTO THE LUNGS EVERY 6 HOURS AS NEEDED FOR WHEEZE OR SHORTNESS OF BREATH 18 g 0   amLODipine-benazepril (LOTREL) 10-40 MG capsule TAKE 1 CAPSULE BY MOUTH EVERY DAY 90 capsule 1   atorvastatin (LIPITOR) 80 MG tablet Take 1 tablet (80 mg total) by mouth daily. 90 tablet 1   cyclobenzaprine (FLEXERIL) 10 MG tablet TAKE 1 TABLET BY MOUTH THREE TIMES A DAY AS NEEDED 90 tablet 5   diphenhydramine-acetaminophen (TYLENOL PM) 25-500 MG TABS Take 1 tablet by mouth at bedtime as needed.     Dulaglutide (TRULICITY) [id number] 0000000 SOPN Inject 0.75 mg into the  skin once a week. 2 mL 0   DULoxetine (CYMBALTA) 60 MG capsule Take 1 capsule (60 mg total) by mouth daily. TAKE 1 CAPSULE BY MOUTH EVERY DAY 90 capsule 1   gabapentin (NEURONTIN) 300 MG capsule TAKE 2 CAPSULES BY MOUTH 3 TIMES A DAY 540 capsule 3   Multiple Vitamin (MULTIVITAMIN WITH MINERALS) TABS Take 1 tablet by mouth daily.     OVER THE COUNTER MEDICATION Take 2 tablets by mouth daily. Energize Caffeine Pills Fast Acting Energy Pill     oxyCODONE (OXY IR/ROXICODONE) 5 MG immediate release tablet 1-2 tabs po q6h prn 90 tablet 0   pantoprazole (PROTONIX) 40 MG tablet Take 1 tablet (40 mg total) by mouth daily. 90 tablet 1   phentermine 37.5 MG capsule Take 1 capsule (37.5 mg total) by mouth  every morning. 30 capsule 5   promethazine (PHENERGAN) 12.5 MG tablet 1-2 tabs po q6h prn nausea 30 tablet 0   sennosides-docusate sodium (SENOKOT-S) 8.6-50 MG tablet Take 1 tablet by mouth 2 (two) times daily. While taking pain meds to prevent constipation 30 tablet 0   Wheat Dextrin (BENEFIBER PO) Take by mouth.     No facility-administered medications prior to visit.    Allergies  Allergen Reactions   Fenofibrate Rash   Codeine Nausea Only   Dilaudid [Hydromorphone Hcl] Nausea And Vomiting    Review of Systems  As per HPI  PE:    09/29/2022   11:10 AM 07/24/2022    2:55 PM 07/09/2022   10:30 AM  Vitals with BMI  Height 5\' 2"   5\' 2"   Weight 192 lbs 6 oz 196 lbs 13 oz 196 lbs  BMI 35.18 99991111 99991111  Systolic 123456 0000000 123456  Diastolic 61 72 70  Pulse 76 78 79    Physical Exam  VS: noted--normal. Gen: alert, NAD, NONTOXIC APPEARING. HEENT: eyes without injection, drainage, or swelling.  Ears: EACs clear, right TM erythematous and bulging.  Left TM with normal light reflex and landmarks.  Nose: Clear rhinorrhea, with some dried, crusty exudate adherent to mildly injected mucosa.  No purulent d/c.  Mild bilateral/diffuse paranasal sinus TTP.  No facial swelling.  Throat and mouth without focal lesion.  No pharyngial swelling, erythema, or exudate.   Neck: supple, no LAD.   LUNGS: CTA bilat, nonlabored resps.   CV: RRR, no m/r/g. EXT: no c/c/e SKIN: no rash   LABS:  Last CBC Lab Results  Component Value Date   WBC 8.9 07/09/2022   HGB 14.0 07/09/2022   HCT 40.5 07/09/2022   MCV 91.8 07/09/2022   MCH 31.7 07/09/2022   RDW 12.6 07/09/2022   PLT 310 [id number]   Last metabolic panel Lab Results  Component Value Date   GLUCOSE 91 07/09/2022   NA 141 07/09/2022   K 4.7 07/09/2022   CL 102 07/09/2022   CO2 28 07/09/2022   BUN 14 07/09/2022   CREATININE 0.94 07/09/2022   EGFR 62 10/22/2020   CALCIUM 9.5 07/09/2022   PHOS 3.5 02/04/2012   PROT 7.1 07/09/2022    ALBUMIN 4.6 06/17/2021   BILITOT 0.6 07/09/2022   ALKPHOS 73 06/17/2021   AST 34 07/09/2022   ALT 34 (H) 07/09/2022   IMPRESSION AND PLAN:  #1 prolonged sinusitis and bronchitis, also right otitis media. Prednisone 40 mg x 5 days, then 20 mg x 5 days. Amoxicillin 875 twice daily x 7 days. Guaifenesin/dextromethorphan tabs 5-100, 1 twice daily as needed. She has an albuterol inhaler to use  every 6 hours as needed.  #2 bilateral osteoarthritis of knees.  Left>> right. Most recent steroid injection by me a couple months ago did not help at all. Will get her back to orthopedic surgery.  She saw Dr. Rachell Cipro in the past.  Will give patient his contact number when I see her back after June and she is on Medicare at that time.  An After Visit Summary was printed and given to the patient.  FOLLOW UP: Return if symptoms worsen or fail to improve, for ok to reschedule 3/.  Signed:  Crissie Sickles, MD           09/29/2022

## 2022-10-03 ENCOUNTER — Other Ambulatory Visit: Payer: Self-pay

## 2022-10-03 MED ORDER — OXYCODONE HCL 5 MG PO TABS: ORAL_TABLET | ORAL | 0 refills | Status: DC

## 2022-10-03 NOTE — Telephone Encounter (Signed)
Patient called to request a refill on the pending medication. Please advise refill.  RX Request: Oxycodone  Last Refill: 09/05/2022

## 2022-10-06 ENCOUNTER — Encounter: Payer: Self-pay | Admitting: Family Medicine

## 2022-10-08 ENCOUNTER — Ambulatory Visit: Payer: 59 | Admitting: Family Medicine

## 2022-10-17 ENCOUNTER — Other Ambulatory Visit: Payer: Self-pay | Admitting: Family Medicine

## 2022-10-17 NOTE — Telephone Encounter (Signed)
Please complete PA

## 2022-11-03 ENCOUNTER — Other Ambulatory Visit (HOSPITAL_COMMUNITY): Payer: Self-pay

## 2022-11-03 ENCOUNTER — Telehealth: Payer: Self-pay

## 2022-11-03 NOTE — Telephone Encounter (Signed)
PA approved, documented in separate encounter.

## 2022-11-03 NOTE — Telephone Encounter (Signed)
Pharmacy Patient Advocate Encounter   Prior Authorization for Pantoprazole has been approved by [employer] (ins).     PA # [id number] Effective dates: 11/03/2022 through 11/03/2023

## 2022-11-03 NOTE — Telephone Encounter (Signed)
Pharmacy Patient Advocate Encounter   Prior Authorization for Pantoprazole has been approved by [employer] (ins).     PA # [id number] Effective dates: 11/03/2022 through 11/03/2023 

## 2022-11-03 NOTE — Telephone Encounter (Signed)
Noted  

## 2022-11-06 ENCOUNTER — Other Ambulatory Visit: Payer: Self-pay | Admitting: Family Medicine

## 2022-11-06 ENCOUNTER — Telehealth: Payer: Self-pay | Admitting: Family Medicine

## 2022-11-06 DIAGNOSIS — G8929 Other chronic pain: Secondary | ICD-10-CM

## 2022-11-06 DIAGNOSIS — M170 Bilateral primary osteoarthritis of knee: Secondary | ICD-10-CM

## 2022-11-06 MED ORDER — OXYCODONE HCL 5 MG PO TABS: ORAL_TABLET | ORAL | 0 refills | Status: DC

## 2022-11-06 NOTE — Telephone Encounter (Signed)
OK, referral to Dr. Orlan Leavens ordered

## 2022-11-06 NOTE — Telephone Encounter (Signed)
Pt is needing refill on Oxycodone. [facility] in [place]

## 2022-11-06 NOTE — Telephone Encounter (Signed)
Requesting:  oxycodone Contract: 07/08/22 UDS: 07/08/22 Last Visit: 09/29/22 Next Visit: 12/17/22 Last Refill: 10/03/22 (90,0)  Please Advise. Med pending

## 2022-11-06 NOTE — Telephone Encounter (Signed)
Pt states that during her last appointment it was discussed that Dr. Milinda Cave would advise his medical assistant to put in a referral to an orthopedic surgeon.  She is follow up on this request.

## 2022-11-06 NOTE — Telephone Encounter (Signed)
Pt was last seen 3/18, it was discussed regarding getting pt back to orthopedic surgery. She saw Dr.Ortmann in the past. She is open to any provider that you recommend.  Please advise on referral request.

## 2022-11-06 NOTE — Telephone Encounter (Signed)
LVM for pt regarding referral. Please also inform pt that a refill was sent for Oxycodone to her local pharmacy.

## 2022-11-06 NOTE — Telephone Encounter (Signed)
LM for pt regarding medication ?

## 2022-11-13 ENCOUNTER — Ambulatory Visit: Payer: 59 | Admitting: Family Medicine

## 2022-11-13 ENCOUNTER — Encounter: Payer: Self-pay | Admitting: Family Medicine

## 2022-11-13 VITALS — BP 117/76 | HR 91 | Temp 98.0°F | Wt 189.2 lb

## 2022-11-13 DIAGNOSIS — M170 Bilateral primary osteoarthritis of knee: Secondary | ICD-10-CM | POA: Diagnosis not present

## 2022-11-13 DIAGNOSIS — J309 Allergic rhinitis, unspecified: Secondary | ICD-10-CM | POA: Diagnosis not present

## 2022-11-13 DIAGNOSIS — G894 Chronic pain syndrome: Secondary | ICD-10-CM

## 2022-11-13 DIAGNOSIS — K047 Periapical abscess without sinus: Secondary | ICD-10-CM

## 2022-11-13 MED ORDER — AMOXICILLIN-POT CLAVULANATE 875-125 MG PO TABS
1.0000 | ORAL_TABLET | Freq: Two times a day (BID) | ORAL | 0 refills | Status: DC
Start: 2022-11-13 — End: 2022-12-12

## 2022-11-13 NOTE — Progress Notes (Signed)
OFFICE VISIT  11/13/2022  CC:  Chief Complaint  Patient presents with   Acute Visit    Left ear pain along with post nasal drainage. She states she also has green mucous.    Patient is a 67 y.o. female who presents for follow-up chronic pain syndrome left ear pain.  HPI: Recent greenish nasal drainage and sinus pressure.  Left lower jaw pain ever since a tooth filling fell out.  Does not have dental insurance.  Needs to wait till after June 1 to make a dental appointment. Left ear hurting.  Feels like front of her neck is hurting.  No cough.  She has some hoarseness of voice in the mornings.  No fever or sore throat.  About 6 weeks ago I treated her for acute sinusitis with amoxicillin and prednisone.  She says the symptoms significantly improved.  Her chronic pain level is stable. Indication for chronic opioid: chronic lumbar and bilat knee osteoarthritis + fibromyalgia pain that I treat with pain meds in order to maximize functioning and quality of life.  Not NSAID candidate due to chronic renal insufficiency. She is also taking cymbalta 60mg  qd and gabapentin 600 mg nightly usually.  Also Flexeril pretty regularly. PMP AWARE reviewed today: most recent rx for oxycodone was filled 11/06/2022, # 90, rx by me. No red flags.  Past Medical History:  Diagnosis Date   Allergy    Bilateral carpal tunnel syndrome    pt to get bilat carpal tunnel release as of 09/2018 ortho eval   Bilateral primary osteoarthritis of knee    L tricompart, R medial compart   BPPV (benign paroxysmal positional vertigo)    Cancer (HCC)    RIGHT KIDNEY   Cataract    Chronic pain syndrome    Chronic renal insufficiency, stage III (moderate) (HCC)    GFR 50's   Fibromyalgia    GERD (gastroesophageal reflux disease)    Heart murmur    Hemorrhoids    History of bronchitis    childhood, wood stove and coal heat   History of galactorrhea 02/05/2011   Hyperlipidemia, mixed    Hypertension    IFG (impaired  fasting glucose) 2016   HbA1c 5.7-5.8.   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 Corlis Hove sign of recurrent dz as of this 05/2016 f/u.  Released from surveillance as of 06/03/16; return to urol prn.   Seasonal allergic rhinitis    Solitary left kidney 2013   Aquired: right nephrectomy for renal cancer    Past Surgical History:  Procedure Laterality Date   CARPAL TUNNEL RELEASE Right 09/2018   CESAREAN SECTION  1983   COLONOSCOPY  04/2013   Normal 2014.  01/2022 normal. Recall 5yrs d/t FH CRCA   DEXA     GYN scheduled this at 05/12/19 f/u.   ROBOT ASSISTED LAPAROSCOPIC NEPHRECTOMY  07/12/2012   Procedure: ROBOTIC ASSISTED LAPAROSCOPIC NEPHRECTOMY;  Surgeon: Sebastian Ache, MD;  Location: [facility];  Service: Urology;  Laterality: Right;   TONSILLECTOMY     TUBAL LIGATION      Outpatient Medications Prior to Visit  Medication Sig Dispense Refill   acetaminophen (TYLENOL) 500 MG tablet Take 1,000 mg by mouth every 8 (eight) hours as needed.     albuterol (VENTOLIN HFA) 108 (90 Base) MCG/ACT inhaler INHALE 2 PUFFS INTO  THE LUNGS EVERY 6 HOURS AS NEEDED FOR WHEEZE OR SHORTNESS OF BREATH 18 g 0   amLODipine-benazepril (LOTREL) 10-40 MG capsule TAKE 1 CAPSULE BY MOUTH EVERY DAY 90 capsule 1   atorvastatin (LIPITOR) 80 MG tablet Take 1 tablet (80 mg total) by mouth daily. 90 tablet 1   cyclobenzaprine (FLEXERIL) 10 MG tablet TAKE 1 TABLET BY MOUTH THREE TIMES A DAY AS NEEDED 90 tablet 5   Dextromethorphan-guaiFENesin 5-100 MG PACK 1 tab po bid prn cough 20 each 1   diphenhydramine-acetaminophen (TYLENOL PM) 25-500 MG TABS Take 1 tablet by mouth at bedtime as needed.     Dulaglutide (TRULICITY) 0.75 MG/0.5ML SOPN Inject 0.75 mg into the skin once a week. 2 mL 0   DULoxetine (CYMBALTA) 60 MG  capsule Take 1 capsule (60 mg total) by mouth daily. TAKE 1 CAPSULE BY MOUTH EVERY DAY 90 capsule 1   gabapentin (NEURONTIN) 300 MG capsule TAKE 2 CAPSULES BY MOUTH 3 TIMES A DAY 540 capsule 3   Multiple Vitamin (MULTIVITAMIN WITH MINERALS) TABS Take 1 tablet by mouth daily.     OVER THE COUNTER MEDICATION Take 2 tablets by mouth daily. Energize Caffeine Pills Fast Acting Energy Pill     oxyCODONE (OXY IR/ROXICODONE) 5 MG immediate release tablet 1-2 tabs po q6h prn 90 tablet 0   pantoprazole (PROTONIX) 40 MG tablet Take 1 tablet (40 mg total) by mouth daily. 90 tablet 1   phentermine 37.5 MG capsule Take 1 capsule (37.5 mg total) by mouth every morning. 30 capsule 5   promethazine (PHENERGAN) 12.5 MG tablet 1-2 tabs po q6h prn nausea 30 tablet 0   sennosides-docusate sodium (SENOKOT-S) 8.6-50 MG tablet Take 1 tablet by mouth 2 (two) times daily. While taking pain meds to prevent constipation 30 tablet 0   Wheat Dextrin (BENEFIBER PO) Take by mouth.     predniSONE (DELTASONE) 20 MG tablet 2 tabs po qd x 5d then 1 tab po qd x 5d (Patient not taking: Reported on 11/13/2022) 15 tablet 0   No facility-administered medications prior to visit.    Allergies  Allergen Reactions   Fenofibrate Rash   Codeine Nausea Only   Dilaudid [Hydromorphone Hcl] Nausea And Vomiting    Review of Systems  As per HPI  PE:    11/13/2022    4:04 PM 09/29/2022   11:10 AM 07/24/2022    2:55 PM  Vitals with BMI  Height  5\' 2"    Weight 189 lbs 3 oz 192 lbs 6 oz 196 lbs 13 oz  BMI  35.18 35.99  Systolic 117 105 161  Diastolic 76 61 72  Pulse 91 76 78     Physical Exam  VS: noted--normal. Gen: alert, NAD, NONTOXIC APPEARING. HEENT: eyes without injection, drainage, or swelling.  Ears: EACs clear, TMs with normal light reflex and landmarks.  Very painful to put the speculum in left ear canal. She has tenderness over the left side of her face in general, particularly left mandibular region.  No swelling or  redness of the face or periorbital region.  Nose: dried, crusty exudate adherent to mildly injected mucosa.  No purulent d/c.  Bilateral paranasal sinus tenderness to palpation, left greater than right.. Throat and mouth without focal lesion other than some mild tooth decay.  No gingival or pharyngial swelling, erythema, or exudate.   Neck: supple, no LAD.   LUNGS: CTA bilat, nonlabored resps.   CV: RRR, no m/r/g. EXT: no c/c/e chronic pain syndrome SKIN: no  rash   LABS:  Last metabolic panel Lab Results  Component Value Date   GLUCOSE 91 07/09/2022   NA 141 07/09/2022   K 4.7 07/09/2022   CL 102 07/09/2022   CO2 28 07/09/2022   BUN 14 07/09/2022   CREATININE 0.94 07/09/2022   EGFR 62 10/22/2020   CALCIUM 9.5 07/09/2022   PHOS 3.5 02/04/2012   PROT 7.1 07/09/2022   ALBUMIN 4.6 06/17/2021   BILITOT 0.6 07/09/2022   ALKPHOS 73 06/17/2021   AST 34 07/09/2022   ALT 34 (H) 07/09/2022   IMPRESSION AND PLAN:  #1 allergic rhinitis, potentially has acute infectious sinusitis. Encouraged her to take daily nonsedating antihistamine and saline nasal spray regularly. Augmentin 875 twice daily x 10 days.  2.  Dental abscess. No definite sign of abscess. Augmentin 875 twice daily x 10 days.  She knows that she needs to make a dentist appointment.  #3 chronic pain syndrome: Bilateral knee osteoarthritis and fibromyalgia. Stable.  Continue oxycodone 5 mg 1-2 every 6 hours as needed, Flexeril 10 mg 3 times daily as needed, gabapentin 600 mg twice daily , and Cymbalta 60 mg daily. No new prescriptions were needed today.  An After Visit Summary was printed and given to the patient.  FOLLOW UP: Return if symptoms worsen or fail to improve. Next cpe 06/2023  Signed:  Santiago Bumpers, MD           11/13/2022

## 2022-12-09 ENCOUNTER — Other Ambulatory Visit: Payer: Self-pay | Admitting: Family Medicine

## 2022-12-09 MED ORDER — OXYCODONE HCL 5 MG PO TABS: ORAL_TABLET | ORAL | 0 refills | Status: DC

## 2022-12-09 NOTE — Telephone Encounter (Signed)
Requesting: oxycodone Contract: 07/08/22 UDS: 07/09/22 Last Visit: 11/13/22 Next Visit:  12/17/22 Last Refill: 11/06/22 (90,0)  Please Advise. Medication pending

## 2022-12-09 NOTE — Telephone Encounter (Signed)
Patient requesting refill on oxyCODONE (OXY IR/ROXICODONE) 5 MG immediate release tablet [facility] [place]

## 2022-12-17 ENCOUNTER — Ambulatory Visit: Payer: 59 | Admitting: Family Medicine

## 2022-12-17 NOTE — Progress Notes (Deleted)
OFFICE VISIT  12/17/2022  CC: No chief complaint on file.   Patient is a 67 y.o. female who presents for follow-up hypertension, hypercholesterolemia, chronic renal insufficiency stage III. I last saw her 1 month ago for chronic pain syndrome follow-up. A/P as of that visit: "#1 allergic rhinitis, potentially has acute infectious sinusitis. Encouraged her to take daily nonsedating antihistamine and saline nasal spray regularly. Augmentin 875 twice daily x 10 days.   2.  Dental abscess. No definite sign of abscess. Augmentin 875 twice daily x 10 days.  She knows that she needs to make a dentist appointment.   #3 chronic pain syndrome: Bilateral knee osteoarthritis and fibromyalgia. Stable.  Continue oxycodone 5 mg 1-2 every 6 hours as needed, Flexeril 10 mg 3 times daily as needed, gabapentin 600 mg twice daily , and Cymbalta 60 mg daily. No new prescriptions were needed today."  INTERIM HX: ***   PMP AWARE reviewed today: most recent rx for oxycodone was filled 12/09/2022, # 90, rx by me.  Most recent phentermine prescription filled 11/07/2022, #30, prescription by me. No red flags.  Past Medical History:  Diagnosis Date   Allergy    Bilateral carpal tunnel syndrome    pt to get bilat carpal tunnel release as of 09/2018 ortho eval   Bilateral primary osteoarthritis of knee    L tricompart, R medial compart   BPPV (benign paroxysmal positional vertigo)    Cancer (HCC)    RIGHT KIDNEY   Cataract    Chronic pain syndrome    Chronic renal insufficiency, stage III (moderate) (HCC)    GFR 50's   Fibromyalgia    GERD (gastroesophageal reflux disease)    Heart murmur    Hemorrhoids    History of bronchitis    childhood, wood stove and coal heat   History of galactorrhea 02/05/2011   Hyperlipidemia, mixed    Hypertension    IFG (impaired fasting glucose) 2016   HbA1c 5.7-5.8.   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 Corlis Hove sign of recurrent dz as of this 05/2016 f/u.  Released from surveillance as of 06/03/16; return to urol prn.   Seasonal allergic rhinitis    Solitary left kidney 2013   Aquired: right nephrectomy for renal cancer    Past Surgical History:  Procedure Laterality Date   CARPAL TUNNEL RELEASE Right 09/2018   CESAREAN SECTION  1983   COLONOSCOPY  04/2013   Normal 2014.  01/2022 normal. Recall 66yrs d/t FH CRCA   DEXA     GYN scheduled this at 05/12/19 f/u.   ROBOT ASSISTED LAPAROSCOPIC NEPHRECTOMY  07/12/2012   Procedure: ROBOTIC ASSISTED LAPAROSCOPIC NEPHRECTOMY;  Surgeon: Sebastian Ache, MD;  Location: [facility];  Service: Urology;  Laterality: Right;   TONSILLECTOMY     TUBAL LIGATION      Outpatient Medications Prior to Visit  Medication Sig Dispense Refill   acetaminophen (TYLENOL) 500 MG tablet Take 1,000 mg by mouth every 8 (eight) hours as needed.     albuterol (VENTOLIN HFA) 108 (90 Base) MCG/ACT inhaler INHALE 2 PUFFS INTO THE LUNGS EVERY 6 HOURS AS NEEDED FOR WHEEZE OR SHORTNESS OF BREATH 18 g 0   amLODipine-benazepril (LOTREL) 10-40 MG capsule TAKE 1 CAPSULE BY MOUTH EVERY DAY 90 capsule 1   atorvastatin (LIPITOR) 80 MG tablet Take  1 tablet (80 mg total) by mouth daily. 90 tablet 1   cyclobenzaprine (FLEXERIL) 10 MG tablet TAKE 1 TABLET BY MOUTH THREE TIMES A DAY AS NEEDED 90 tablet 5   Dextromethorphan-guaiFENesin 5-100 MG PACK 1 tab po bid prn cough 20 each 1   diphenhydramine-acetaminophen (TYLENOL PM) 25-500 MG TABS Take 1 tablet by mouth at bedtime as needed.     Dulaglutide (TRULICITY) 0.75 MG/0.5ML SOPN Inject 0.75 mg into the skin once a week. 2 mL 0   DULoxetine (CYMBALTA) 60 MG capsule Take 1 capsule (60 mg total) by mouth daily. TAKE 1 CAPSULE BY MOUTH EVERY DAY 90 capsule 1   gabapentin (NEURONTIN) 300 MG capsule TAKE 2  CAPSULES BY MOUTH 3 TIMES A DAY 540 capsule 3   Multiple Vitamin (MULTIVITAMIN WITH MINERALS) TABS Take 1 tablet by mouth daily.     OVER THE COUNTER MEDICATION Take 2 tablets by mouth daily. Energize Caffeine Pills Fast Acting Energy Pill     oxyCODONE (OXY IR/ROXICODONE) 5 MG immediate release tablet 1-2 tabs po q6h prn 90 tablet 0   pantoprazole (PROTONIX) 40 MG tablet Take 1 tablet (40 mg total) by mouth daily. 90 tablet 1   phentermine 37.5 MG capsule Take 1 capsule (37.5 mg total) by mouth every morning. 30 capsule 5   promethazine (PHENERGAN) 12.5 MG tablet 1-2 tabs po q6h prn nausea 30 tablet 0   sennosides-docusate sodium (SENOKOT-S) 8.6-50 MG tablet Take 1 tablet by mouth 2 (two) times daily. While taking pain meds to prevent constipation 30 tablet 0   Wheat Dextrin (BENEFIBER PO) Take by mouth.     amoxicillin-clavulanate (AUGMENTIN) 875-125 MG tablet Take 1 tablet by mouth 2 (two) times daily. 20 tablet 0   No facility-administered medications prior to visit.    Allergies  Allergen Reactions   Fenofibrate Rash   Codeine Nausea Only   Dilaudid [Hydromorphone Hcl] Nausea And Vomiting    Review of Systems As per HPI  PE:    11/13/2022    4:04 PM 09/29/2022   11:10 AM 07/24/2022    2:55 PM  Vitals with BMI  Height  5\' 2"    Weight 189 lbs 3 oz 192 lbs 6 oz 196 lbs 13 oz  BMI  35.18 35.99  Systolic 117 105 161  Diastolic 76 61 72  Pulse 91 76 78     Physical Exam  ***  LABS:  Last CBC Lab Results  Component Value Date   WBC 8.9 07/09/2022   HGB 14.0 07/09/2022   HCT 40.5 07/09/2022   MCV 91.8 07/09/2022   MCH 31.7 07/09/2022   RDW 12.6 07/09/2022   PLT 310 07/09/2022   Last metabolic panel Lab Results  Component Value Date   GLUCOSE 91 07/09/2022   NA 141 07/09/2022   K 4.7 07/09/2022   CL 102 07/09/2022   CO2 28 07/09/2022   BUN 14 07/09/2022   CREATININE 0.94 07/09/2022   EGFR 62 10/22/2020   CALCIUM 9.5 07/09/2022   PHOS 3.5 02/04/2012   PROT  7.1 07/09/2022   ALBUMIN 4.6 06/17/2021   BILITOT 0.6 07/09/2022   ALKPHOS 73 06/17/2021   AST 34 07/09/2022   ALT 34 (H) 07/09/2022   Last lipids Lab Results  Component Value Date   CHOL 248 (H) 07/09/2022   HDL 110 07/09/2022   LDLCALC 110 (H) 07/09/2022   LDLDIRECT 125.0 06/17/2021   TRIG 160 (H) 07/09/2022   CHOLHDL 2.3 07/09/2022   Last hemoglobin A1c Lab  Results  Component Value Date   HGBA1C 5.8 06/17/2021   Last thyroid functions Lab Results  Component Value Date   TSH 0.93 06/17/2021   IMPRESSION AND PLAN:  No problem-specific Assessment & Plan notes found for this encounter.   An After Visit Summary was printed and given to the patient.  FOLLOW UP: No follow-ups on file. Next CPE 06/2023  Signed:  Santiago Bumpers, MD           12/17/2022

## 2022-12-19 NOTE — Patient Instructions (Signed)
   It was very nice to see you today!   Please call Dr.Holland's office to schedule your pap smear, [phone]  PLEASE NOTE:   If you had any lab tests please let us know if you have not heard back within a few days. You may see your results on MyChart before we have a chance to review them but we will give you a call once they are reviewed by [place]. If we ordered any referrals today, please let us know if you have not heard from their office within the next 2 weeks. You should receive a letter via MyChart confirming if the referral was approved and their office contact information to schedule.

## 2022-12-23 ENCOUNTER — Encounter: Payer: Self-pay | Admitting: Family Medicine

## 2022-12-23 ENCOUNTER — Ambulatory Visit (INDEPENDENT_AMBULATORY_CARE_PROVIDER_SITE_OTHER): Payer: Medicare HMO | Admitting: Family Medicine

## 2022-12-23 VITALS — BP 114/69 | HR 60 | Wt 185.8 lb

## 2022-12-23 DIAGNOSIS — M25561 Pain in right knee: Secondary | ICD-10-CM

## 2022-12-23 DIAGNOSIS — R7303 Prediabetes: Secondary | ICD-10-CM

## 2022-12-23 DIAGNOSIS — N1831 Chronic kidney disease, stage 3a: Secondary | ICD-10-CM

## 2022-12-23 DIAGNOSIS — E7800 Pure hypercholesterolemia, unspecified: Secondary | ICD-10-CM

## 2022-12-23 DIAGNOSIS — G8929 Other chronic pain: Secondary | ICD-10-CM | POA: Diagnosis not present

## 2022-12-23 DIAGNOSIS — M25562 Pain in left knee: Secondary | ICD-10-CM | POA: Diagnosis not present

## 2022-12-23 DIAGNOSIS — N2889 Other specified disorders of kidney and ureter: Secondary | ICD-10-CM

## 2022-12-23 DIAGNOSIS — I10 Essential (primary) hypertension: Secondary | ICD-10-CM

## 2022-12-23 DIAGNOSIS — Z23 Encounter for immunization: Secondary | ICD-10-CM

## 2022-12-23 DIAGNOSIS — M170 Bilateral primary osteoarthritis of knee: Secondary | ICD-10-CM

## 2022-12-23 MED ORDER — AMLODIPINE BESY-BENAZEPRIL HCL 10-40 MG PO CAPS: ORAL_CAPSULE | ORAL | 1 refills | Status: DC

## 2022-12-23 MED ORDER — GABAPENTIN 300 MG PO CAPS: ORAL_CAPSULE | ORAL | 1 refills | Status: DC

## 2022-12-23 MED ORDER — TETANUS-DIPHTH-ACELL PERTUSSIS 5-2.5-18.5 LF-MCG/0.5 IM SUSP
0.5000 mL | Freq: Once | INTRAMUSCULAR | 0 refills | Status: AC
Start: 2022-12-23 — End: 2022-12-23

## 2022-12-23 MED ORDER — PANTOPRAZOLE SODIUM 40 MG PO TBEC: 40.0000 mg | DELAYED_RELEASE_TABLET | Freq: Every day | ORAL | 1 refills | Status: DC

## 2022-12-23 MED ORDER — ALBUTEROL SULFATE HFA 108 (90 BASE) MCG/ACT IN AERS: INHALATION_SPRAY | RESPIRATORY_TRACT | 0 refills | Status: AC

## 2022-12-23 MED ORDER — PHENTERMINE HCL 37.5 MG PO CAPS: 37.5000 mg | ORAL_CAPSULE | ORAL | 5 refills | Status: DC

## 2022-12-23 MED ORDER — CYCLOBENZAPRINE HCL 10 MG PO TABS: ORAL_TABLET | ORAL | 5 refills | Status: DC

## 2022-12-23 MED ORDER — ATORVASTATIN CALCIUM 80 MG PO TABS: 80.0000 mg | ORAL_TABLET | Freq: Every day | ORAL | 1 refills | Status: DC

## 2022-12-23 MED ORDER — DULOXETINE HCL 60 MG PO CPEP: 60.0000 mg | ORAL_CAPSULE | Freq: Every day | ORAL | 1 refills | Status: DC

## 2022-12-23 NOTE — Progress Notes (Signed)
OFFICE VISIT  12/23/2022  CC:  Chief Complaint  Patient presents with   Follow-up    3 month follow up. Wants referral to ortho for knee pain.    Patient is a 67 y.o. female who presents for hypertension, hyperlipidemia, wt mgmt, and chronic renal insufficiency stage III. I last saw her about 5 wks ago. A/P as of that visit: "1 allergic rhinitis, potentially has acute infectious sinusitis. Encouraged her to take daily nonsedating antihistamine and saline nasal spray regularly. Augmentin 875 twice daily x 10 days.   2.  Dental abscess. No definite sign of abscess. Augmentin 875 twice daily x 10 days.  She knows that she needs to make a dentist appointment.   #3 chronic pain syndrome: Bilateral knee osteoarthritis and fibromyalgia. Stable.  Continue oxycodone 5 mg 1-2 every 6 hours as needed, Flexeril 10 mg 3 times daily as needed, gabapentin 600 mg twice daily , and Cymbalta 60 mg daily. No new prescriptions were needed today."  INTERIM HX: Feeling well other than her chronic fatigue and chronic bilat knee pain, L>>R  ROS as above, plus--> no fevers, no CP, no SOB, no wheezing, no cough, no dizziness, no HAs, no rashes, no melena/hematochezia.  No polyuria or polydipsia.    No focal weakness, paresthesias, or tremors.  No acute vision or hearing abnormalities.  No dysuria or unusual/new urinary urgency or frequency.  No recent changes in lower legs. No n/v/d or abd pain.  No palpitations.    PMP AWARE reviewed today: most recent rx for oxycodone was filled 12/09/2022, # 90, rx by me.  Most recent phentermine prescription filled 11/07/2022, #30, prescription by me. No red flags.  Past Medical History:  Diagnosis Date   Allergy    Bilateral carpal tunnel syndrome    pt to get bilat carpal tunnel release as of 09/2018 ortho eval   Bilateral primary osteoarthritis of knee    L tricompart, R medial compart   BPPV (benign paroxysmal positional vertigo)    Cancer (HCC)    RIGHT KIDNEY    Cataract    Chronic pain syndrome    Chronic renal insufficiency, stage III (moderate) (HCC)    GFR 50's   Fibromyalgia    GERD (gastroesophageal reflux disease)    Heart murmur    Hemorrhoids    History of bronchitis    childhood, wood stove and coal heat   History of galactorrhea 02/05/2011   Hyperlipidemia, mixed    Hypertension    IFG (impaired fasting glucose) 2016   HbA1c 5.7-5.8.   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 Corlis Hove sign of recurrent dz as of this 05/2016 f/u.  Released from surveillance as of 06/03/16; return to urol prn.   Seasonal allergic rhinitis    Solitary left kidney 2013   Aquired: right nephrectomy for renal cancer    Past Surgical History:  Procedure Laterality Date   CARPAL TUNNEL RELEASE Right 09/2018   CESAREAN SECTION  1983   COLONOSCOPY  04/2013   Normal 2014.  01/2022 normal. Recall 26yrs d/t FH CRCA   DEXA     GYN scheduled this at 05/12/19 f/u.   ROBOT ASSISTED LAPAROSCOPIC NEPHRECTOMY  07/12/2012   Procedure: ROBOTIC ASSISTED LAPAROSCOPIC NEPHRECTOMY;  Surgeon: Sebastian Ache, MD;  Location: [facility];  Service: Urology;  Laterality: Right;  TONSILLECTOMY     TUBAL LIGATION      Outpatient Medications Prior to Visit  Medication Sig Dispense Refill   acetaminophen (TYLENOL) 500 MG tablet Take 1,000 mg by mouth every 8 (eight) hours as needed.     Dextromethorphan-guaiFENesin 5-100 MG PACK 1 tab po bid prn cough 20 each 1   diphenhydramine-acetaminophen (TYLENOL PM) 25-500 MG TABS Take 1 tablet by mouth at bedtime as needed.     Multiple Vitamin (MULTIVITAMIN WITH MINERALS) TABS Take 1 tablet by mouth daily.     OVER THE COUNTER MEDICATION Take 2 tablets by mouth daily. Energize Caffeine Pills Fast Acting Energy Pill     oxyCODONE (OXY  IR/ROXICODONE) 5 MG immediate release tablet 1-2 tabs po q6h prn 90 tablet 0   promethazine (PHENERGAN) 12.5 MG tablet 1-2 tabs po q6h prn nausea 30 tablet 0   sennosides-docusate sodium (SENOKOT-S) 8.6-50 MG tablet Take 1 tablet by mouth 2 (two) times daily. While taking pain meds to prevent constipation 30 tablet 0   Wheat Dextrin (BENEFIBER PO) Take by mouth.     Dulaglutide (TRULICITY) 0.75 MG/0.5ML SOPN Inject 0.75 mg into the skin once a week. 2 mL 0   phentermine 37.5 MG capsule Take 1 capsule (37.5 mg total) by mouth every morning. 30 capsule 5   albuterol (VENTOLIN HFA) 108 (90 Base) MCG/ACT inhaler INHALE 2 PUFFS INTO THE LUNGS EVERY 6 HOURS AS NEEDED FOR WHEEZE OR SHORTNESS OF BREATH 18 g 0   amLODipine-benazepril (LOTREL) 10-40 MG capsule TAKE 1 CAPSULE BY MOUTH EVERY DAY 90 capsule 1   atorvastatin (LIPITOR) 80 MG tablet Take 1 tablet (80 mg total) by mouth daily. 90 tablet 1   cyclobenzaprine (FLEXERIL) 10 MG tablet TAKE 1 TABLET BY MOUTH THREE TIMES A DAY AS NEEDED 90 tablet 5   DULoxetine (CYMBALTA) 60 MG capsule Take 1 capsule (60 mg total) by mouth daily. TAKE 1 CAPSULE BY MOUTH EVERY DAY 90 capsule 1   gabapentin (NEURONTIN) 300 MG capsule TAKE 2 CAPSULES BY MOUTH 3 TIMES A DAY 540 capsule 3   pantoprazole (PROTONIX) 40 MG tablet Take 1 tablet (40 mg total) by mouth daily. 90 tablet 1   No facility-administered medications prior to visit.    Allergies  Allergen Reactions   Fenofibrate Rash   Codeine Nausea Only   Dilaudid [Hydromorphone Hcl] Nausea And Vomiting    Review of Systems As per HPI  PE:    12/23/2022    9:54 AM 11/13/2022    4:04 PM 09/29/2022   11:10 AM  Vitals with BMI  Height   5\' 2"   Weight 185 lbs 13 oz 189 lbs 3 oz 192 lbs 6 oz  BMI   35.18  Systolic 114 117 161  Diastolic 69 76 61  Pulse 60 91 76     Physical Exam  Gen: Alert, well appearing.  Patient is oriented to person, place, time, and situation. AFFECT: pleasant, lucid thought and  speech. CV: RRR, no m/r/g.   LUNGS: CTA bilat, nonlabored resps, good aeration in all lung fields.   LABS:  Last CBC Lab Results  Component Value Date   WBC 8.9 07/09/2022   HGB 14.0 07/09/2022   HCT 40.5 07/09/2022   MCV 91.8 07/09/2022   MCH 31.7 07/09/2022   RDW 12.6 07/09/2022   PLT 310 07/09/2022   Last metabolic panel Lab Results  Component Value Date   GLUCOSE 91 07/09/2022   NA 141 07/09/2022   K 4.7 07/09/2022  CL 102 07/09/2022   CO2 28 07/09/2022   BUN 14 07/09/2022   CREATININE 0.94 07/09/2022   EGFR 62 10/22/2020   CALCIUM 9.5 07/09/2022   PHOS 3.5 02/04/2012   PROT 7.1 07/09/2022   ALBUMIN 4.6 06/17/2021   BILITOT 0.6 07/09/2022   ALKPHOS 73 06/17/2021   AST 34 07/09/2022   ALT 34 (H) 07/09/2022   Last lipids Lab Results  Component Value Date   CHOL 248 (H) 07/09/2022   HDL 110 07/09/2022   LDLCALC 110 (H) 07/09/2022   LDLDIRECT 125.0 06/17/2021   TRIG 160 (H) 07/09/2022   CHOLHDL 2.3 07/09/2022   Last hemoglobin A1c Lab Results  Component Value Date   HGBA1C 5.8 06/17/2021   Last thyroid functions Lab Results  Component Value Date   TSH 0.93 06/17/2021   IMPRESSION AND PLAN:  1) HTN, doing well on lotrel 10-40 qd. Lytes/cr today.  2) HLD, doing well on atorvastatin 80 qd. Lipids and hepatic panel today.  3) CRI IIIa. Avoids NSAIDs. Lytes/cr today.  4) Chronic bilat Knee pain d/t osteoarthritis. She no longer responds to steroid injections. She desires referral to orthopedist to discuss her next options.  An After Visit Summary was printed and given to the patient.  FOLLOW UP: Return in about 3 months (around 03/25/2023) for routine chronic illness f/u. Next CPE 06/2023 Signed:  Santiago Bumpers, MD           12/23/2022

## 2022-12-24 LAB — COMPREHENSIVE METABOLIC PANEL
AG Ratio: 2.2 (calc) (ref 1.0–2.5)
ALT: 16 U/L (ref 6–29)
AST: 17 U/L (ref 10–35)
Albumin: 4.3 g/dL (ref 3.6–5.1)
Alkaline phosphatase (APISO): 79 U/L (ref 37–153)
BUN/Creatinine Ratio: 17 (calc) (ref 6–22)
BUN: 18 mg/dL (ref 7–25)
CO2: 23 mmol/L (ref 20–32)
Calcium: 9.1 mg/dL (ref 8.6–10.4)
Chloride: 104 mmol/L (ref 98–110)
Creat: 1.09 mg/dL — ABNORMAL HIGH (ref 0.50–1.05)
Globulin: 2 g/dL (calc) (ref 1.9–3.7)
Glucose, Bld: 100 mg/dL — ABNORMAL HIGH (ref 65–99)
Potassium: 3.9 mmol/L (ref 3.5–5.3)
Sodium: 139 mmol/L (ref 135–146)
Total Bilirubin: 0.5 mg/dL (ref 0.2–1.2)
Total Protein: 6.3 g/dL (ref 6.1–8.1)

## 2022-12-24 LAB — LIPID PANEL
Cholesterol: 177 mg/dL (ref <200)
HDL: 69 mg/dL (ref >=50)
LDL Cholesterol (Calc): 76 mg/dL (calc)
Non-HDL Cholesterol (Calc): 108 mg/dL (calc) (ref <130)
Total CHOL/HDL Ratio: 2.6 (calc) (ref <5.0)
Triglycerides: 219 mg/dL — ABNORMAL HIGH (ref <150)

## 2022-12-24 LAB — HEMOGLOBIN A1C
Hgb A1c MFr Bld: 5.8 % of total Hgb — ABNORMAL HIGH (ref <5.7)
Mean Plasma Glucose: 120 mg/dL
eAG (mmol/L): 6.6 mmol/L

## 2022-12-24 NOTE — Progress Notes (Signed)
Left pt VM

## 2023-01-09 ENCOUNTER — Telehealth: Payer: Self-pay

## 2023-01-09 MED ORDER — OXYCODONE HCL 5 MG PO TABS: ORAL_TABLET | ORAL | 0 refills | Status: DC

## 2023-01-09 NOTE — Telephone Encounter (Signed)
Next OV 9/11. Pt aware that you are out of the office

## 2023-01-09 NOTE — Telephone Encounter (Signed)
Rx sent 

## 2023-01-09 NOTE — Telephone Encounter (Signed)
Prescription Request  01/09/2023  LOV: Visit date not found  What is the name of the medication or equipment?  oxyCODONE (OXY IR/ROXICODONE) 5 MG immediate release tablet  Have you contacted your pharmacy to request a refill? Yes   Which pharmacy would you like this sent to?  [facility] [phone] [place] [place] - [address] [place] [address] Phone: [phone] Fax: [phone]    Patient notified that their request is being sent to the clinical staff for review and that they should receive a response within 2 business days.   Please advise at Mobile [phone] (mobile)

## 2023-01-12 NOTE — Telephone Encounter (Signed)
Pt aware.

## 2023-02-09 ENCOUNTER — Other Ambulatory Visit: Payer: Self-pay

## 2023-02-09 MED ORDER — OXYCODONE HCL 5 MG PO TABS: ORAL_TABLET | ORAL | 0 refills | Status: DC

## 2023-02-09 NOTE — Telephone Encounter (Signed)
[facility] [place]. Next OV 03/25/23

## 2023-02-17 ENCOUNTER — Other Ambulatory Visit: Payer: Self-pay

## 2023-02-17 NOTE — Telephone Encounter (Signed)
Prescription Request  02/17/2023  LOV: Visit date not found  What is the name of the medication or equipment?  phentermine 37.5 MG capsule  **Per pharmacy medication is on back order. Pharmacy has the said medication in a tablet form. Patient would like to change from capsule to tablet.  Have you contacted your pharmacy to request a refill? No   Which pharmacy would you like this sent to?  [phone] [place] - [address] [place] [address] Phone: [phone] Fax: [phone]    Patient notified that their request is being sent to the clinical staff for review and that they should receive a response within 2 business days.   Please advise at [facility] [phone]

## 2023-02-18 MED ORDER — PHENTERMINE HCL 37.5 MG PO CAPS: 37.5000 mg | ORAL_CAPSULE | ORAL | 0 refills | Status: DC

## 2023-02-18 NOTE — Telephone Encounter (Signed)
Left pt VM

## 2023-02-18 NOTE — Telephone Encounter (Signed)
Okay, phentermine prescription sent

## 2023-02-19 MED ORDER — PHENTERMINE HCL 37.5 MG PO TABS: 37.5000 mg | ORAL_TABLET | Freq: Every morning | ORAL | 0 refills | Status: DC

## 2023-02-19 NOTE — Telephone Encounter (Signed)
Same prescription was sent to pharmacy. Patient requested to have Phentermine 37.5 tablet called in because pharmacy is on back order with capsule form.  See original note from 8/6.   [phone] - [place], [place] - [address] [place] [address] Phone: [phone] Fax: [phone]  Please patient when sent. [phone]

## 2023-02-19 NOTE — Telephone Encounter (Signed)
New request for tablet form of Phentermine. Rx pending.  Please fill, if appropriate.

## 2023-02-26 ENCOUNTER — Encounter (INDEPENDENT_AMBULATORY_CARE_PROVIDER_SITE_OTHER): Payer: Self-pay

## 2023-03-12 ENCOUNTER — Telehealth: Payer: Self-pay

## 2023-03-12 MED ORDER — OXYCODONE HCL 5 MG PO TABS: ORAL_TABLET | ORAL | 0 refills | Status: DC

## 2023-03-12 NOTE — Telephone Encounter (Signed)
Oxycodone eRx'd 

## 2023-03-12 NOTE — Telephone Encounter (Signed)
LMV for pt to return call.  Note: Please inform pt refill sent to pharmacy.

## 2023-03-12 NOTE — Telephone Encounter (Signed)
Please fill, if appropriate.  

## 2023-03-12 NOTE — Telephone Encounter (Signed)
Prescription Request  03/12/2023  LOV: Visit date not found  What is the name of the medication or equipment? oxyCODONE (OXY IR/ROXICODONE) 5 MG immediate release tablet   Have you contacted your pharmacy to request a refill? No   Which pharmacy would you like this sent to?  [phone] - [place] - [address] [place] [address] Phone: [phone] Fax: [phone]    Patient notified that their request is being sent to the clinical staff for review and that they should receive a response within 2 business days.   Please advise at Mobile [phone] (mobile)

## 2023-03-13 NOTE — Telephone Encounter (Signed)
Left detailed message per DPR  

## 2023-03-25 ENCOUNTER — Ambulatory Visit: Payer: Medicare HMO | Admitting: Family Medicine

## 2023-04-01 ENCOUNTER — Telehealth: Payer: Self-pay | Admitting: Family Medicine

## 2023-04-01 NOTE — Telephone Encounter (Signed)
Please give [name] a call to let her know id she will be having labs done during tomorrows appointment.

## 2023-04-02 ENCOUNTER — Encounter: Payer: Self-pay | Admitting: Family Medicine

## 2023-04-02 ENCOUNTER — Ambulatory Visit (INDEPENDENT_AMBULATORY_CARE_PROVIDER_SITE_OTHER): Payer: Medicare HMO | Admitting: Family Medicine

## 2023-04-02 VITALS — BP 117/82 | HR 94 | Temp 98.5°F | Wt 184.0 lb

## 2023-04-02 DIAGNOSIS — M797 Fibromyalgia: Secondary | ICD-10-CM

## 2023-04-02 DIAGNOSIS — M25561 Pain in right knee: Secondary | ICD-10-CM

## 2023-04-02 DIAGNOSIS — G8929 Other chronic pain: Secondary | ICD-10-CM

## 2023-04-02 DIAGNOSIS — M25562 Pain in left knee: Secondary | ICD-10-CM | POA: Diagnosis not present

## 2023-04-02 DIAGNOSIS — G894 Chronic pain syndrome: Secondary | ICD-10-CM

## 2023-04-02 DIAGNOSIS — Z23 Encounter for immunization: Secondary | ICD-10-CM | POA: Diagnosis not present

## 2023-04-02 DIAGNOSIS — M170 Bilateral primary osteoarthritis of knee: Secondary | ICD-10-CM

## 2023-04-02 DIAGNOSIS — I10 Essential (primary) hypertension: Secondary | ICD-10-CM

## 2023-04-02 DIAGNOSIS — N1831 Chronic kidney disease, stage 3a: Secondary | ICD-10-CM | POA: Diagnosis not present

## 2023-04-02 DIAGNOSIS — Z7689 Persons encountering health services in other specified circumstances: Secondary | ICD-10-CM

## 2023-04-02 MED ORDER — PROMETHAZINE HCL 12.5 MG PO TABS: ORAL_TABLET | ORAL | 0 refills | Status: AC

## 2023-04-02 NOTE — Progress Notes (Signed)
OFFICE VISIT  04/02/2023  CC:  Chief Complaint  Patient presents with   Medical Management of Chronic Issues    Pt is not fasting; involuntary movement when taking gabapentin, concerns of brain fog (speech); possible med reaction    Patient is a 67 y.o. female who presents for 67-month follow-up hypertension, weight management, chronic renal insufficiency stage III, and chronic pain syndrome. She also has some ear discomfort and sore throat. A/P as of last visit: "1) HTN, doing well on lotrel 10-40 qd. Lytes/cr today.   2) HLD, doing well on atorvastatin 80 qd. Lipids and hepatic panel today.   3) CRI IIIa. Avoids NSAIDs. Lytes/cr today.   4) Chronic bilat Knee pain d/t osteoarthritis. She no longer responds to steroid injections. She desires referral to orthopedist to discuss her next options."  INTERIM HX: Having more brain fog lately, some sinus HA's and stress HA's. Some ST and pressure in ears lately.  Notes some involuntary jerking of legs when taking gabapentin in daytime.  Takes it only hs now b/c sleeps better on it. She is exercising some lately, trying to eat better.  Pain still bad in knees, has not made f/u arrangements with ortho yet (Dr. Melvyn Novas) but plans to.    Indication for chronic opioid: chronic lumbar and bilat knee osteoarthritis + fibromyalgia pain that I treat with pain meds in order to maximize functioning and quality of life.  Not NSAID candidate due to chronic renal insufficiency. She is also taking cymbalta 60mg  qd and gabapentin 600 mg nightly usually.  Also Flexeril pretty regularly. PMP AWARE reviewed today: most recent rx for oxycodone 5 mg was filled 03/12/2023, # 90, rx by me. Most recent phentermine 37.5 mg prescription filled 02/27/2023, #30, prescription by me. No red flags.  ROS as above, plus--> no fevers, no CP, no SOB, no wheezing, no cough, no dizziness, no HAs, no rashes, no melena/hematochezia.  No polyuria or polydipsia.    No focal  weakness, paresthesias, or tremors.  No acute vision or hearing abnormalities.  No dysuria or unusual/new urinary urgency or frequency.  No recent changes in lower legs. No n/v/d or abd pain.  No palpitations.    Past Medical History:  Diagnosis Date   Allergy    Bilateral carpal tunnel syndrome    pt to get bilat carpal tunnel release as of 09/2018 ortho eval   Bilateral primary osteoarthritis of knee    L tricompart, R medial compart   BPPV (benign paroxysmal positional vertigo)    Cancer (HCC)    RIGHT KIDNEY   Cataract    Chronic pain syndrome    Chronic renal insufficiency, stage III (moderate) (HCC)    GFR 50's   Fibromyalgia    GERD (gastroesophageal reflux disease)    Heart murmur    Hemorrhoids    History of bronchitis    childhood, wood stove and coal heat   History of galactorrhea 02/05/2011   Hyperlipidemia, mixed    Hypertension    IFG (impaired fasting glucose) 2016   HbA1c 5.7-5.8.   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 Corlis Hove sign of recurrent dz as of this 05/2016 f/u.  Released from surveillance as of 06/03/16; return to urol prn.   Seasonal allergic rhinitis    Solitary left kidney 2013   Aquired: right  nephrectomy for renal cancer    Past Surgical History:  Procedure Laterality Date   CARPAL TUNNEL RELEASE Right 09/2018   CESAREAN SECTION  1983   COLONOSCOPY  04/2013   Normal 2014.  01/2022 normal. Recall 56yrs d/t FH CRCA   DEXA     GYN scheduled this at 05/12/19 f/u.   ROBOT ASSISTED LAPAROSCOPIC NEPHRECTOMY  07/12/2012   Procedure: ROBOTIC ASSISTED LAPAROSCOPIC NEPHRECTOMY;  Surgeon: Sebastian Ache, MD;  Location: [facility];  Service: Urology;  Laterality: Right;   TONSILLECTOMY     TUBAL LIGATION      Outpatient Medications Prior to Visit   Medication Sig Dispense Refill   acetaminophen (TYLENOL) 500 MG tablet Take 1,000 mg by mouth every 8 (eight) hours as needed.     albuterol (VENTOLIN HFA) 108 (90 Base) MCG/ACT inhaler INHALE 2 PUFFS INTO THE LUNGS EVERY 6 HOURS AS NEEDED FOR WHEEZE OR SHORTNESS OF BREATH 18 g 0   amLODipine-benazepril (LOTREL) 10-40 MG capsule TAKE 1 CAPSULE BY MOUTH EVERY DAY 90 capsule 1   atorvastatin (LIPITOR) 80 MG tablet Take 1 tablet (80 mg total) by mouth daily. 90 tablet 1   cyclobenzaprine (FLEXERIL) 10 MG tablet TAKE 1 TABLET BY MOUTH THREE TIMES A DAY AS NEEDED 90 tablet 5   Dextromethorphan-guaiFENesin 5-100 MG PACK 1 tab po bid prn cough 20 each 1   diphenhydramine-acetaminophen (TYLENOL PM) 25-500 MG TABS Take 1 tablet by mouth at bedtime as needed.     DULoxetine (CYMBALTA) 60 MG capsule Take 1 capsule (60 mg total) by mouth daily. TAKE 1 CAPSULE BY MOUTH EVERY DAY 90 capsule 1   gabapentin (NEURONTIN) 300 MG capsule TAKE 2 CAPSULES BY MOUTH 3 TIMES A DAY 540 capsule 1   Multiple Vitamin (MULTIVITAMIN WITH MINERALS) TABS Take 1 tablet by mouth daily.     OVER THE COUNTER MEDICATION Take 2 tablets by mouth daily. Energize Caffeine Pills Fast Acting Energy Pill     oxyCODONE (OXY IR/ROXICODONE) 5 MG immediate release tablet 1-2 tabs po q6h prn 90 tablet 0   pantoprazole (PROTONIX) 40 MG tablet Take 1 tablet (40 mg total) by mouth daily. 90 tablet 1   phentermine (ADIPEX-P) 37.5 MG tablet Take 1 tablet (37.5 mg total) by mouth every morning. 30 tablet 0   promethazine (PHENERGAN) 12.5 MG tablet 1-2 tabs po q6h prn nausea 30 tablet 0   sennosides-docusate sodium (SENOKOT-S) 8.6-50 MG tablet Take 1 tablet by mouth 2 (two) times daily. While taking pain meds to prevent constipation 30 tablet 0   Wheat Dextrin (BENEFIBER PO) Take by mouth.     No facility-administered medications prior to visit.    Allergies  Allergen Reactions   Fenofibrate Rash   Codeine Nausea Only   Dilaudid [Hydromorphone  Hcl] Nausea And Vomiting    Review of Systems As per HPI  PE:    04/02/2023    2:36 PM 12/23/2022    9:54 AM 11/13/2022    4:04 PM  Vitals with BMI  Weight 184 lbs 185 lbs 13 oz 189 lbs 3 oz  Systolic 117 114 161  Diastolic 82 69 76  Pulse 94 60 91     Physical Exam Gen: Alert, well appearing.  Patient is oriented to person, place, time, and situation. AFFECT: pleasant, lucid thought and speech. VS: noted--normal. Gen: alert, NAD, NONTOXIC APPEARING. HEENT: eyes without injection, drainage, or swelling.  Ears: EACs clear, TMs with normal light reflex and landmarks.  Nose: Clear rhinorrhea, with some dried,  crusty exudate adherent to mildly injected mucosa.  No purulent d/c.  No paranasal sinus TTP.  No facial swelling.  Throat and mouth without focal lesion.  No pharyngial swelling, erythema, or exudate.   Neck: supple, no LAD.   LUNGS: CTA bilat, nonlabored resps.   CV: RRR, no m/r/g. EXT: no c/c/e SKIN: no rash  LABS:  Last CBC Lab Results  Component Value Date   WBC 8.9 07/09/2022   HGB 14.0 07/09/2022   HCT 40.5 07/09/2022   MCV 91.8 07/09/2022   MCH 31.7 07/09/2022   RDW 12.6 07/09/2022   PLT 310 07/09/2022   Last metabolic panel Lab Results  Component Value Date   GLUCOSE 100 (H) 12/23/2022   NA 139 12/23/2022   K 3.9 12/23/2022   CL 104 12/23/2022   CO2 23 12/23/2022   BUN 18 12/23/2022   CREATININE 1.09 (H) 12/23/2022   GFR 48.41 (L) 03/19/2022   CALCIUM 9.1 12/23/2022   PHOS 3.5 02/04/2012   PROT 6.3 12/23/2022   ALBUMIN 4.6 06/17/2021   BILITOT 0.5 12/23/2022   ALKPHOS 73 06/17/2021   AST 17 12/23/2022   ALT 16 12/23/2022   Last lipids Lab Results  Component Value Date   CHOL 177 12/23/2022   HDL 69 12/23/2022   LDLCALC 76 12/23/2022   LDLDIRECT 125.0 06/17/2021   TRIG 219 (H) 12/23/2022   CHOLHDL 2.6 12/23/2022   Last hemoglobin A1c Lab Results  Component Value Date   HGBA1C 5.8 (H) 12/23/2022   Last thyroid functions Lab Results   Component Value Date   TSH 0.93 06/17/2021   IMPRESSION AND PLAN:  1) Chronic pain syndrome: osteoarthritis bilat knees, low back. Also fibromyalgia syndrome. Pretty stable on oxycodone 5mg , 1-2 q6h prn, #90/mo. CSC and UDS UTD.  2) HTN, well controlled on lotrel 10-40 every day. Lytes/cr today.  3) CRI III: solitary kidney. Avoids NSAIDs. BMET today.  4) Wt mgmt, bmi 33. Working on [employer].  Pain making signif exercise impossible. Phentermine 37.5 every day.  5)  Chronic bilat Knee pain d/t osteoarthritis. She no longer responds to steroid injections. She is going to arrange f/u with ortho to see next steps.  An After Visit Summary was printed and given to the patient.  FOLLOW UP: No follow-ups on file. Next CPE 06/2023 Signed:  Santiago Bumpers, MD           04/02/2023

## 2023-04-02 NOTE — Telephone Encounter (Signed)
I will do a basic metabolic panel but cholesterol does not need to be repeated at this time. She does not have to fast.

## 2023-04-02 NOTE — Telephone Encounter (Signed)
LVM for pt to return call.  Note: if pt returns call back, please inform of message above.

## 2023-04-02 NOTE — Telephone Encounter (Signed)
Please confirm if lipid and cmet will be repeated tomorrow

## 2023-04-03 ENCOUNTER — Telehealth: Payer: Self-pay

## 2023-04-03 LAB — BASIC METABOLIC PANEL
BUN/Creatinine Ratio: 12 (calc) (ref 6–22)
BUN: 19 mg/dL (ref 7–25)
CO2: 25 mmol/L (ref 20–32)
Calcium: 9.2 mg/dL (ref 8.6–10.4)
Chloride: 103 mmol/L (ref 98–110)
Creat: 1.59 mg/dL — ABNORMAL HIGH (ref 0.50–1.05)
Glucose, Bld: 105 mg/dL — ABNORMAL HIGH (ref 65–99)
Potassium: 4.6 mmol/L (ref 3.5–5.3)
Sodium: 139 mmol/L (ref 135–146)

## 2023-04-03 MED ORDER — AMLODIPINE BESYLATE 10 MG PO TABS: 10.0000 mg | ORAL_TABLET | Freq: Every day | ORAL | 1 refills | Status: DC

## 2023-04-03 NOTE — Telephone Encounter (Signed)
-----   Message from Jeoffrey Massed sent at 04/03/2023  8:43 AM EDT ----- Please notify [name] that her kidney function has dropped significantly. I want her to stop her amlodipine/benazepril.  Please send in prescription for amlodipine 10 mg, 1 tab daily, #30, refill x 1. Additionally, make sure she is drinking at least 65 ounces of clear fluids a day. Follow-up with me in 5 to 7 days and we will recheck kidney function.

## 2023-04-03 NOTE — Telephone Encounter (Signed)
Pt was seen yesterday for appt, nothing further needed.

## 2023-04-09 ENCOUNTER — Ambulatory Visit: Payer: Medicare HMO | Admitting: Family Medicine

## 2023-04-09 ENCOUNTER — Encounter: Payer: Self-pay | Admitting: Family Medicine

## 2023-04-09 VITALS — BP 140/70 | HR 81 | Temp 97.4°F | Wt 182.4 lb

## 2023-04-09 DIAGNOSIS — N1831 Chronic kidney disease, stage 3a: Secondary | ICD-10-CM | POA: Diagnosis not present

## 2023-04-09 DIAGNOSIS — J340 Abscess, furuncle and carbuncle of nose: Secondary | ICD-10-CM | POA: Diagnosis not present

## 2023-04-09 DIAGNOSIS — Z905 Acquired absence of kidney: Secondary | ICD-10-CM

## 2023-04-09 DIAGNOSIS — R7989 Other specified abnormal findings of blood chemistry: Secondary | ICD-10-CM

## 2023-04-09 DIAGNOSIS — I10 Essential (primary) hypertension: Secondary | ICD-10-CM

## 2023-04-09 LAB — POCT URINALYSIS DIPSTICK
Blood, UA: NEGATIVE
Glucose, UA: NEGATIVE
Ketones, UA: NEGATIVE
Nitrite, UA: NEGATIVE
Protein, UA: POSITIVE — AB
Spec Grav, UA: 1.015 (ref 1.010–1.025)
Urobilinogen, UA: 0.2 E.U./dL
pH, UA: 7 (ref 5.0–8.0)

## 2023-04-09 MED ORDER — CEPHALEXIN 500 MG PO CAPS: ORAL_CAPSULE | ORAL | 0 refills | Status: DC

## 2023-04-09 MED ORDER — METOPROLOL SUCCINATE ER 25 MG PO TB24: 25.0000 mg | ORAL_TABLET | Freq: Every day | ORAL | 0 refills | Status: DC

## 2023-04-09 MED ORDER — MUPIROCIN 2 % EX OINT: 1.0000 | TOPICAL_OINTMENT | Freq: Three times a day (TID) | CUTANEOUS | 0 refills | Status: DC

## 2023-04-09 NOTE — Progress Notes (Signed)
OFFICE VISIT  04/09/2023  CC:  Chief Complaint  Patient presents with   Follow-up    5-7 day follow up. She has a blister inside her nose that she is concerned about.    Patient is a 67 y.o. female who presents for 1 week follow-up elevated serum creatinine and recheck hypertension.  INTERIM HX: Last visit her serum creatinine came back 1.59 and her baseline has been 1.0. At that time I recommended she stop her amlodipine-benazepril and I started her on plain amlodipine 10 mg a day. No home blood pressure monitoring. No problems from the amlodipine.  Still having pain in the right nostril, feels a swollen area there.  She is using saline nasal spray.  Left nostril hurts some but no swelling.  Past Medical History:  Diagnosis Date   Allergy    Bilateral carpal tunnel syndrome    pt to get bilat carpal tunnel release as of 09/2018 ortho eval   Bilateral primary osteoarthritis of knee    L tricompart, R medial compart   BPPV (benign paroxysmal positional vertigo)    Cancer (HCC)    RIGHT KIDNEY   Cataract    Chronic pain syndrome    Chronic renal insufficiency, stage III (moderate) (HCC)    GFR 50's   Fibromyalgia    GERD (gastroesophageal reflux disease)    Heart murmur    Hemorrhoids    History of bronchitis    childhood, wood stove and coal heat   History of galactorrhea 02/05/2011   Hyperlipidemia, mixed    Hypertension    IFG (impaired fasting glucose) 2016   HbA1c 5.7-5.8.   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 Corlis Hove sign of recurrent dz as of this 05/2016 f/u.  Released from surveillance as of 06/03/16; return to urol prn.   Seasonal allergic rhinitis    Solitary left kidney 2013   Aquired: right nephrectomy for renal cancer    Past Surgical History:   Procedure Laterality Date   CARPAL TUNNEL RELEASE Right 09/2018   CESAREAN SECTION  1983   COLONOSCOPY  04/2013   Normal 2014.  01/2022 normal. Recall 2yrs d/t FH CRCA   DEXA     GYN scheduled this at 05/12/19 f/u.   ROBOT ASSISTED LAPAROSCOPIC NEPHRECTOMY  07/12/2012   Procedure: ROBOTIC ASSISTED LAPAROSCOPIC NEPHRECTOMY;  Surgeon: Sebastian Ache, MD;  Location: [facility];  Service: Urology;  Laterality: Right;   TONSILLECTOMY     TUBAL LIGATION      Outpatient Medications Prior to Visit  Medication Sig Dispense Refill   acetaminophen (TYLENOL) 500 MG tablet Take 1,000 mg by mouth every 8 (eight) hours as needed.     albuterol (VENTOLIN HFA) 108 (90 Base) MCG/ACT inhaler INHALE 2 PUFFS INTO THE LUNGS EVERY 6 HOURS AS NEEDED FOR WHEEZE OR SHORTNESS OF BREATH 18 g 0   amLODipine (NORVASC) 10 MG tablet Take 1 tablet (10 mg total) by mouth daily. 30 tablet 1   atorvastatin (LIPITOR) 80 MG tablet Take 1 tablet (80 mg total) by mouth daily. 90 tablet 1   cyclobenzaprine (FLEXERIL) 10 MG tablet TAKE 1 TABLET BY MOUTH THREE TIMES A DAY AS NEEDED 90 tablet 5   Dextromethorphan-guaiFENesin 5-100 MG PACK 1 tab po bid prn cough 20 each 1   diphenhydramine-acetaminophen (TYLENOL PM) 25-500 MG TABS  Take 1 tablet by mouth at bedtime as needed.     DULoxetine (CYMBALTA) 60 MG capsule Take 1 capsule (60 mg total) by mouth daily. TAKE 1 CAPSULE BY MOUTH EVERY DAY 90 capsule 1   gabapentin (NEURONTIN) 300 MG capsule TAKE 2 CAPSULES BY MOUTH 3 TIMES A DAY 540 capsule 1   Multiple Vitamin (MULTIVITAMIN WITH MINERALS) TABS Take 1 tablet by mouth daily.     OVER THE COUNTER MEDICATION Take 2 tablets by mouth daily. Energize Caffeine Pills Fast Acting Energy Pill     oxyCODONE (OXY IR/ROXICODONE) 5 MG immediate release tablet 1-2 tabs po q6h prn 90 tablet 0   pantoprazole (PROTONIX) 40 MG tablet Take 1 tablet (40 mg total) by mouth daily. 90 tablet 1   phentermine (ADIPEX-P) 37.5 MG tablet Take 1 tablet (37.5 mg  total) by mouth every morning. 30 tablet 0   promethazine (PHENERGAN) 12.5 MG tablet 1-2 tabs po q6h prn nausea 30 tablet 0   sennosides-docusate sodium (SENOKOT-S) 8.6-50 MG tablet Take 1 tablet by mouth 2 (two) times daily. While taking pain meds to prevent constipation 30 tablet 0   Wheat Dextrin (BENEFIBER PO) Take by mouth.     No facility-administered medications prior to visit.    Allergies  Allergen Reactions   Fenofibrate Rash   Codeine Nausea Only   Dilaudid [Hydromorphone Hcl] Nausea And Vomiting    Review of Systems As per HPI  PE:    04/09/2023    2:32 PM 04/09/2023    2:26 PM 04/02/2023    2:36 PM  Vitals with BMI  Weight  182 lbs 6 oz 184 lbs  Systolic 140 149 086  Diastolic 70 75 82  Pulse  81 94     Physical Exam  Gen: Alert, well appearing.  Patient is oriented to person, place, time, and situation. Right nasal passage with no injection but there is a focal nodular swelling on the lateral wall with a tiny surface slough.  Left nostril clear.  LABS:  Last metabolic panel Lab Results  Component Value Date   GLUCOSE 105 (H) 04/02/2023   NA 139 04/02/2023   K 4.6 04/02/2023   CL 103 04/02/2023   CO2 25 04/02/2023   BUN 19 04/02/2023   CREATININE 1.59 (H) 04/02/2023   GFR 48.41 (L) 03/19/2022   CALCIUM 9.2 04/02/2023   PHOS 3.5 02/04/2012   PROT 6.3 12/23/2022   ALBUMIN 4.6 06/17/2021   BILITOT 0.5 12/23/2022   ALKPHOS 73 06/17/2021   AST 17 12/23/2022   ALT 16 12/23/2022   IMPRESSION AND PLAN:  #1 acute on chronic renal failure. Will see how her kidney function is now since discontinuing her ACE-I. This is all in the setting of solitary L kidney (hx R nephrectomy for kidney cancer in 2013). Recheck be met today and also check urinalysis today. if renal function has not improved or if blood in urine then we will get renal ultrasound. (UA showed no blood, trace protein).  #2 hypertension, not controlled since change of blood pressure  medicine. Start Toprol-XL 25 mg a day. Continue amlodipine 10 mg a day. She will start monitoring blood pressure and heart rate at home.  3.  Furuncle of nasal wall.  Warm compress 20 minutes twice a day recommended. Start Bactroban ointment 3 times daily. Keflex 500 mg 3 times daily x 7 days.  An After Visit Summary was printed and given to the patient.  FOLLOW UP: Return in about 2 weeks (around  04/23/2023) for f/u bp and nose. Next cpe 06/2023 Signed:  Santiago Bumpers, MD           04/09/2023

## 2023-04-10 ENCOUNTER — Other Ambulatory Visit: Payer: Self-pay

## 2023-04-10 LAB — BASIC METABOLIC PANEL
BUN/Creatinine Ratio: 16 (calc) (ref 6–22)
BUN: 17 mg/dL (ref 7–25)
CO2: 30 mmol/L (ref 20–32)
Calcium: 9.4 mg/dL (ref 8.6–10.4)
Chloride: 100 mmol/L (ref 98–110)
Creat: 1.08 mg/dL — ABNORMAL HIGH (ref 0.50–1.05)
Glucose, Bld: 92 mg/dL (ref 65–99)
Potassium: 4.5 mmol/L (ref 3.5–5.3)
Sodium: 137 mmol/L (ref 135–146)

## 2023-04-10 MED ORDER — OXYCODONE HCL 5 MG PO TABS: ORAL_TABLET | ORAL | 0 refills | Status: DC

## 2023-04-10 NOTE — Telephone Encounter (Signed)
Requesting: oxyCODONE (OXY IR/ROXICODONE) 5 MG immediate release tablet  Contract: 07/08/22 UDS: 07/09/22 Last Visit: 04/02/23 Next Visit: 04/23/23 Last Refill: 03/12/23 (90,0)  Please Advise. Med pending

## 2023-04-13 ENCOUNTER — Other Ambulatory Visit: Payer: Self-pay | Admitting: Family Medicine

## 2023-04-15 ENCOUNTER — Telehealth: Payer: Self-pay | Admitting: Family Medicine

## 2023-04-15 MED ORDER — PHENTERMINE HCL 37.5 MG PO TABS: 37.5000 mg | ORAL_TABLET | Freq: Every morning | ORAL | 5 refills | Status: DC

## 2023-04-15 NOTE — Telephone Encounter (Signed)
Prescription Request  04/15/2023  LOV: 04/09/2023  What is the name of the medication or equipment? phentermine (ADIPEX-P) 37.5 MG tablet  Patient request Capsules not tablets  Have you contacted your pharmacy to request a refill? Yes   Which pharmacy would you like this sent to?  [facility] [phone] - [place], [place] - [address] [place] [address] Phone: [phone] Fax: [phone]    Patient notified that their request is being sent to the clinical staff for review and that they should receive a response within 2 business days.   Please advise at Mobile [phone] (mobile)

## 2023-04-15 NOTE — Telephone Encounter (Signed)
Okay, phentermine prescription sent

## 2023-04-16 NOTE — Telephone Encounter (Signed)
Pt advised refill sent. °

## 2023-04-23 ENCOUNTER — Encounter: Payer: Self-pay | Admitting: Family Medicine

## 2023-04-23 ENCOUNTER — Ambulatory Visit: Payer: Medicare HMO | Admitting: Family Medicine

## 2023-04-23 VITALS — BP 136/70 | HR 73 | Temp 98.4°F | Ht 62.0 in | Wt 184.4 lb

## 2023-04-23 DIAGNOSIS — Z905 Acquired absence of kidney: Secondary | ICD-10-CM

## 2023-04-23 DIAGNOSIS — M25561 Pain in right knee: Secondary | ICD-10-CM

## 2023-04-23 DIAGNOSIS — M170 Bilateral primary osteoarthritis of knee: Secondary | ICD-10-CM

## 2023-04-23 DIAGNOSIS — G8929 Other chronic pain: Secondary | ICD-10-CM | POA: Diagnosis not present

## 2023-04-23 DIAGNOSIS — M25562 Pain in left knee: Secondary | ICD-10-CM | POA: Diagnosis not present

## 2023-04-23 DIAGNOSIS — N1831 Chronic kidney disease, stage 3a: Secondary | ICD-10-CM | POA: Diagnosis not present

## 2023-04-23 NOTE — Progress Notes (Signed)
OFFICE VISIT  04/23/2023  CC: f/u HTN and nose infxn  Patient is a 66 y.o. female who presents for 2-week follow-up uncontrolled hypertension and furuncle of nose. A/P as of last visit: "#1 acute on chronic renal failure. Will see how her kidney function is now since discontinuing her ACE-I. This is all in the setting of solitary L kidney (hx R nephrectomy for kidney cancer in 2013). Recheck be met today and also check urinalysis today. if renal function has not improved or if blood in urine then we will get renal ultrasound. (UA showed no blood, trace protein).   #2 hypertension, not controlled since change of blood pressure medicine. Start Toprol-XL 25 mg a day. Continue amlodipine 10 mg a day. She will start monitoring blood pressure and heart rate at home.   3.  Furuncle of nasal wall.  Warm compress 20 minutes twice a day recommended. Start Bactroban ointment 3 times daily. Keflex 500 mg 3 times daily x 7 days."  INTERIM HX: Labs last visit showed her serum creatinine go back down to her baseline, 1.08. Her spot urine showed no blood but was positive for protein and 2+ leukocytes.  Feeling well.  Her nose pain and swelling has resolved completely.  Tolerating the metoprolol without problem. Home blood pressures in the 140s over 90s typically.  Heart rate up to 90 at times. No headache, visual abnormalities, palpitations, or dizziness.  Past Medical History:  Diagnosis Date   Allergy    Bilateral carpal tunnel syndrome    pt to get bilat carpal tunnel release as of 09/2018 ortho eval   Bilateral primary osteoarthritis of knee    L tricompart, R medial compart   BPPV (benign paroxysmal positional vertigo)    Cancer (HCC)    RIGHT KIDNEY   Cataract    Chronic pain syndrome    Chronic renal insufficiency, stage III (moderate) (HCC)    GFR 50's   Fibromyalgia    GERD (gastroesophageal reflux disease)    Heart murmur    Hemorrhoids    History of bronchitis     childhood, wood stove and coal heat   History of galactorrhea 02/05/2011   Hyperlipidemia, mixed    Hypertension    IFG (impaired fasting glucose) 2016   HbA1c 5.7-5.8.   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 Corlis Hove sign of recurrent dz as of this 05/2016 f/u.  Released from surveillance as of 06/03/16; return to urol prn.   Seasonal allergic rhinitis    Solitary left kidney 2013   Aquired: right nephrectomy for renal cancer    Past Surgical History:  Procedure Laterality Date   CARPAL TUNNEL RELEASE Right 09/2018   CESAREAN SECTION  1983   COLONOSCOPY  04/2013   Normal 2014.  01/2022 normal. Recall 71yrs d/t FH CRCA   DEXA     GYN scheduled this at 05/12/19 f/u.   ROBOT ASSISTED LAPAROSCOPIC NEPHRECTOMY  07/12/2012   Procedure: ROBOTIC ASSISTED LAPAROSCOPIC NEPHRECTOMY;  Surgeon: Sebastian Ache, MD;  Location: [facility];  Service: Urology;  Laterality: Right;   TONSILLECTOMY     TUBAL LIGATION      Outpatient Medications Prior to Visit  Medication Sig Dispense Refill   acetaminophen (TYLENOL) 500 MG tablet Take 1,000 mg by mouth every 8 (eight) hours as needed.  albuterol (VENTOLIN HFA) 108 (90 Base) MCG/ACT inhaler INHALE 2 PUFFS INTO THE LUNGS EVERY 6 HOURS AS NEEDED FOR WHEEZE OR SHORTNESS OF BREATH 18 g 0   amLODipine (NORVASC) 10 MG tablet Take 1 tablet (10 mg total) by mouth daily. 30 tablet 1   atorvastatin (LIPITOR) 80 MG tablet TAKE 1 TABLET BY MOUTH EVERY DAY 90 tablet 1   cephALEXin (KEFLEX) 500 MG capsule 1 cap po tid x 7d 21 capsule 0   cyclobenzaprine (FLEXERIL) 10 MG tablet TAKE 1 TABLET BY MOUTH THREE TIMES A DAY AS NEEDED 90 tablet 5   Dextromethorphan-guaiFENesin 5-100 MG PACK 1 tab po bid prn cough 20 each 1   diphenhydramine-acetaminophen (TYLENOL PM) 25-500  MG TABS Take 1 tablet by mouth at bedtime as needed.     DULoxetine (CYMBALTA) 60 MG capsule Take 1 capsule (60 mg total) by mouth daily. TAKE 1 CAPSULE BY MOUTH EVERY DAY 90 capsule 1   gabapentin (NEURONTIN) 300 MG capsule TAKE 2 CAPSULES BY MOUTH 3 TIMES A DAY 540 capsule 1   metoprolol succinate (TOPROL-XL) 25 MG 24 hr tablet Take 1 tablet (25 mg total) by mouth daily. 30 tablet 0   Multiple Vitamin (MULTIVITAMIN WITH MINERALS) TABS Take 1 tablet by mouth daily.     mupirocin ointment (BACTROBAN) 2 % Apply 1 Application topically 3 (three) times daily. 15 g 0   OVER THE COUNTER MEDICATION Take 2 tablets by mouth daily. Energize Caffeine Pills Fast Acting Energy Pill     oxyCODONE (OXY IR/ROXICODONE) 5 MG immediate release tablet 1-2 tabs po q6h prn 90 tablet 0   pantoprazole (PROTONIX) 40 MG tablet Take 1 tablet (40 mg total) by mouth daily. 90 tablet 1   phentermine (ADIPEX-P) 37.5 MG tablet Take 1 tablet (37.5 mg total) by mouth every morning. 30 tablet 5   promethazine (PHENERGAN) 12.5 MG tablet 1-2 tabs po q6h prn nausea 30 tablet 0   sennosides-docusate sodium (SENOKOT-S) 8.6-50 MG tablet Take 1 tablet by mouth 2 (two) times daily. While taking pain meds to prevent constipation 30 tablet 0   Wheat Dextrin (BENEFIBER PO) Take by mouth.     No facility-administered medications prior to visit.    Allergies  Allergen Reactions   Fenofibrate Rash   Codeine Nausea Only   Dilaudid [Hydromorphone Hcl] Nausea And Vomiting    Review of Systems As per HPI  PE:    04/23/2023   11:32 AM 04/23/2023   11:27 AM 04/09/2023    2:32 PM  Vitals with BMI  Height  5\' 2"    Weight  184 lbs 6 oz   BMI  33.72   Systolic 136 143 161  Diastolic 70 83 70  Pulse  73      Physical Exam  Gen: Alert, well appearing.  Patient is oriented to person, place, time, and situation. AFFECT: pleasant, lucid thought and speech. No further exam today  LABS:  Last metabolic panel Lab Results   Component Value Date   GLUCOSE 92 04/09/2023   NA 137 04/09/2023   K 4.5 04/09/2023   CL 100 04/09/2023   CO2 30 04/09/2023   BUN 17 04/09/2023   CREATININE 1.08 (H) 04/09/2023   GFR 48.41 (L) 03/19/2022   CALCIUM 9.4 04/09/2023   PHOS 3.5 02/04/2012   PROT 6.3 12/23/2022   ALBUMIN 4.6 06/17/2021   BILITOT 0.5 12/23/2022   ALKPHOS 73 06/17/2021   AST 17 12/23/2022   ALT 16 12/23/2022    IMPRESSION AND  PLAN:  #1 uncontrolled hypertension. Increase Toprol-XL to 50 mg a day. Continue amlodipine 10 mg a day.  #2 chronic renal insufficiency stage IIIa. This is in the setting of solitary L kidney (hx R nephrectomy for kidney cancer in 2013). Referral to nephrology ordered today to get her established.  #3 nasal furuncle--resolved.  #4 chronic bilateral knee pain due to advanced bilateral osteoarthritis. Referral to [facility] ordered today.  An After Visit Summary was printed and given to the patient.  FOLLOW UP: Return in about 1 week (around 04/30/2023) for Follow-up hypertension.  Signed:  Santiago Bumpers, MD           04/23/2023

## 2023-04-24 ENCOUNTER — Telehealth: Payer: Self-pay | Admitting: Family Medicine

## 2023-04-24 MED ORDER — FLUCONAZOLE 150 MG PO TABS: 150.0000 mg | ORAL_TABLET | Freq: Every day | ORAL | 0 refills | Status: DC

## 2023-04-24 MED ORDER — PHENTERMINE HCL 37.5 MG PO CAPS: 37.5000 mg | ORAL_CAPSULE | ORAL | 2 refills | Status: DC

## 2023-04-24 NOTE — Telephone Encounter (Signed)
Please Advise

## 2023-04-24 NOTE — Telephone Encounter (Signed)
Okay, Diflucan sent and phentermine capsules sent

## 2023-04-24 NOTE — Telephone Encounter (Signed)
Pharmacy fax received patient is requesting capsule form of Phentermine instead of tablets that were sent.  Please further advise.

## 2023-04-24 NOTE — Telephone Encounter (Signed)
Prescription Request  04/24/2023  LOV: 04/23/2023 Patient requesting due to completing antibiotics and always getting a yeast infection after.   What is the name of the medication or equipment? fluconazole (DIFLUCAN) 150 MG tablet (Expired)   Have you contacted your pharmacy to request a refill? No   Which pharmacy would you like this sent to?  [phone] [place] - [address] [place] [address] Phone: [phone] Fax: [phone]    Patient notified that their request is being sent to the clinical staff for review and that they should receive a response within 2 business days.   Please advise at Mobile [phone] (mobile)

## 2023-04-24 NOTE — Telephone Encounter (Signed)
Pt advised medications sent to the pharmacy.

## 2023-04-25 ENCOUNTER — Other Ambulatory Visit: Payer: Self-pay | Admitting: Family Medicine

## 2023-04-28 NOTE — Patient Instructions (Signed)
   If you have not been contacted to schedule, please contact [facility]   [address] [place] [address] [phone]  [facility] [address] [place] [address] [phone]

## 2023-04-30 ENCOUNTER — Ambulatory Visit: Payer: Medicare HMO | Admitting: Family Medicine

## 2023-04-30 ENCOUNTER — Encounter: Payer: Self-pay | Admitting: Family Medicine

## 2023-04-30 VITALS — BP 103/68 | HR 77 | Wt 184.4 lb

## 2023-04-30 DIAGNOSIS — R0789 Other chest pain: Secondary | ICD-10-CM

## 2023-04-30 DIAGNOSIS — I10 Essential (primary) hypertension: Secondary | ICD-10-CM | POA: Diagnosis not present

## 2023-04-30 MED ORDER — METOPROLOL SUCCINATE ER 50 MG PO TB24: 50.0000 mg | ORAL_TABLET | Freq: Every day | ORAL | 3 refills | Status: DC

## 2023-04-30 NOTE — Progress Notes (Signed)
OFFICE VISIT  04/30/2023  CC:  Chief Complaint  Patient presents with   Hypertension    F/U. Pt has been taking BP at home and has been averaging 140/80.    Patient is a 67 y.o. female who presents for 1 week f/u uncontrolled HTN. A/P as of last visit: "#1 uncontrolled hypertension. Increase Toprol-XL to 50 mg a day. Continue amlodipine 10 mg a day.   #2 chronic renal insufficiency stage IIIa. This is in the setting of solitary L kidney (hx R nephrectomy for kidney cancer in 2013). Referral to nephrology ordered today to get her established.   #3 nasal furuncle--resolved.   #4 chronic bilateral knee pain due to advanced bilateral osteoarthritis. Referral to [facility] ordered today."  INTERIM HX: Home blood pressures are 120s over 70s.  Heart rate in the 70s.  She tells me about a long history of feeling the sharp pain in the left parasternal region intermittently.  Often she feels it when she has been walking a lot or when she gets emotionally upset/angry. No nausea, shortness of breath, palpitations, diaphoresis, jaw pain, or arm pain.  She does have a significant history of reflux but says the pain can be there whether or not she feels heartburn or not.   Past Medical History:  Diagnosis Date   Allergy    Bilateral carpal tunnel syndrome    pt to get bilat carpal tunnel release as of 09/2018 ortho eval   Bilateral primary osteoarthritis of knee    L tricompart, R medial compart   BPPV (benign paroxysmal positional vertigo)    Cancer (HCC)    RIGHT KIDNEY   Cataract    Chronic pain syndrome    Chronic renal insufficiency, stage III (moderate) (HCC)    GFR 50's   Fibromyalgia    GERD (gastroesophageal reflux disease)    Heart murmur    Hemorrhoids    History of bronchitis    childhood, wood stove and coal heat   History of galactorrhea 02/05/2011   Hyperlipidemia, mixed    Hypertension    IFG (impaired fasting glucose) 2016   HbA1c 5.7-5.8.   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 Corlis Hove sign of recurrent dz as of this 05/2016 f/u.  Released from surveillance as of 06/03/16; return to urol prn.   Seasonal allergic rhinitis    Solitary left kidney 2013   Aquired: right nephrectomy for renal cancer    Past Surgical History:  Procedure Laterality Date   CARPAL TUNNEL RELEASE Right 09/2018   CESAREAN SECTION  1983   COLONOSCOPY  04/2013   Normal 2014.  01/2022 normal. Recall 18yrs d/t FH CRCA   DEXA     GYN scheduled this at 05/12/19 f/u.   ROBOT ASSISTED LAPAROSCOPIC NEPHRECTOMY  07/12/2012   Procedure: ROBOTIC ASSISTED LAPAROSCOPIC NEPHRECTOMY;  Surgeon: Sebastian Ache, MD;  Location: [facility];  Service: Urology;  Laterality: Right;   TONSILLECTOMY     TUBAL LIGATION      Outpatient Medications Prior to Visit  Medication Sig Dispense Refill   acetaminophen (TYLENOL) 500 MG tablet Take 1,000 mg by mouth every 8 (eight) hours as needed.     albuterol (VENTOLIN HFA) 108 (90 Base) MCG/ACT inhaler INHALE 2 PUFFS INTO THE LUNGS EVERY 6 HOURS AS NEEDED FOR WHEEZE OR SHORTNESS OF BREATH  18 g 0   amLODipine (NORVASC) 10 MG tablet Take 1 tablet (10 mg total) by mouth daily. 30 tablet 1   atorvastatin (LIPITOR) 80 MG tablet TAKE 1 TABLET BY MOUTH EVERY DAY 90 tablet 1   cephALEXin (KEFLEX) 500 MG capsule 1 cap po tid x 7d 21 capsule 0   cyclobenzaprine (FLEXERIL) 10 MG tablet TAKE 1 TABLET BY MOUTH THREE TIMES A DAY AS NEEDED 90 tablet 5   Dextromethorphan-guaiFENesin 5-100 MG PACK 1 tab po bid prn cough 20 each 1   diphenhydramine-acetaminophen (TYLENOL PM) 25-500 MG TABS Take 1 tablet by mouth at bedtime as needed.     DULoxetine (CYMBALTA) 60 MG capsule Take 1 capsule (60 mg total) by mouth daily. TAKE 1 CAPSULE BY MOUTH EVERY DAY 90 capsule 1    fluconazole (DIFLUCAN) 150 MG tablet Take 1 tablet (150 mg total) by mouth daily. 1 tab po x 1 day 1 tablet 0   gabapentin (NEURONTIN) 300 MG capsule TAKE 2 CAPSULES BY MOUTH 3 TIMES A DAY 540 capsule 1   metoprolol succinate (TOPROL-XL) 25 MG 24 hr tablet Take 1 tablet (25 mg total) by mouth daily. 30 tablet 0   Multiple Vitamin (MULTIVITAMIN WITH MINERALS) TABS Take 1 tablet by mouth daily.     mupirocin ointment (BACTROBAN) 2 % Apply 1 Application topically 3 (three) times daily. 15 g 0   OVER THE COUNTER MEDICATION Take 2 tablets by mouth daily. Energize Caffeine Pills Fast Acting Energy Pill     oxyCODONE (OXY IR/ROXICODONE) 5 MG immediate release tablet 1-2 tabs po q6h prn 90 tablet 0   pantoprazole (PROTONIX) 40 MG tablet Take 1 tablet (40 mg total) by mouth daily. 90 tablet 1   phentermine 37.5 MG capsule Take 1 capsule (37.5 mg total) by mouth every morning. 30 capsule 2   promethazine (PHENERGAN) 12.5 MG tablet 1-2 tabs po q6h prn nausea 30 tablet 0   sennosides-docusate sodium (SENOKOT-S) 8.6-50 MG tablet Take 1 tablet by mouth 2 (two) times daily. While taking pain meds to prevent constipation 30 tablet 0   Wheat Dextrin (BENEFIBER PO) Take by mouth.     No facility-administered medications prior to visit.    Allergies  Allergen Reactions   Fenofibrate Rash   Codeine Nausea Only   Dilaudid [Hydromorphone Hcl] Nausea And Vomiting    Review of Systems As per HPI  PE:    04/30/2023    1:23 PM 04/23/2023   11:32 AM 04/23/2023   11:27 AM  Vitals with BMI  Height   5\' 2"   Weight 184 lbs 6 oz  184 lbs 6 oz  BMI 33.72  33.72  Systolic 103 136 454  Diastolic 68 70 83  Pulse 77  73     Physical Exam  Gen: Alert, well appearing.  Patient is oriented to person, place, time, and situation. AFFECT: pleasant, lucid thought and speech. No further exam today  LABS:  Last metabolic panel Lab Results  Component Value Date   GLUCOSE 92 04/09/2023   NA 137 04/09/2023   K  4.5 04/09/2023   CL 100 04/09/2023   CO2 30 04/09/2023   BUN 17 04/09/2023   CREATININE 1.08 (H) 04/09/2023   GFR 48.41 (L) 03/19/2022   CALCIUM 9.4 04/09/2023   PHOS 3.5 02/04/2012   PROT 6.3 12/23/2022   ALBUMIN 4.6 06/17/2021   BILITOT 0.5 12/23/2022   ALKPHOS 73 06/17/2021   AST 17 12/23/2022   ALT 16 12/23/2022  Essential hypertension  IMPRESSION AND PLAN:  #1 hypertension, well-controlled on Toprol-XL 50 mg a day and amlodipine 10 mg a day.  #2 atypical chest pain. Suspect this is esophageal pain related to her reflux. However, she expresses interest in possibly looking into a stress test in the future.  She wants to hold off for now and try to pay attention to the episodes more to see if any exacerbating factors that she is not recognizing and/or any additional symptoms with the pain.  An After Visit Summary was printed and given to the patient.  FOLLOW UP: No follow-ups on file.  Signed:  Santiago Bumpers, MD           04/30/2023

## 2023-05-13 ENCOUNTER — Telehealth: Payer: Self-pay | Admitting: Family Medicine

## 2023-05-13 MED ORDER — OXYCODONE HCL 5 MG PO TABS: ORAL_TABLET | ORAL | 0 refills | Status: DC

## 2023-05-13 NOTE — Telephone Encounter (Signed)
Oxycodone prescription sent.

## 2023-05-13 NOTE — Telephone Encounter (Signed)
Prescription Request  05/13/2023  LOV: 04/30/2023  What is the name of the medication or equipment? oxyCODONE (OXY IR/ROXICODONE) 5 MG immediate release tablet   Have you contacted your pharmacy to request a refill? Yes   Which pharmacy would you like this sent to?  [phone] [place] [place] - [address] [place] [address] Phone: [phone] Fax: [phone]    Patient notified that their request is being sent to the clinical staff for review and that they should receive a response within 2 business days.   Please advise at Mobile [phone] (mobile)

## 2023-05-14 DIAGNOSIS — M170 Bilateral primary osteoarthritis of knee: Secondary | ICD-10-CM | POA: Diagnosis not present

## 2023-05-27 ENCOUNTER — Other Ambulatory Visit: Payer: Self-pay | Admitting: Family Medicine

## 2023-05-29 ENCOUNTER — Telehealth: Payer: Self-pay

## 2023-05-29 NOTE — Telephone Encounter (Signed)
Prescription Request  05/29/2023  LOV: Visit date not found  What is the name of the medication or equipment? phentermine 37.5 MG capsule   Have you contacted your pharmacy to request a refill? No   Which pharmacy would you like this sent to?  [phone] [place] [place] - [address] [place] [address] Phone: [phone] Fax: [phone]    Patient notified that their request is being sent to the clinical staff for review and that they should receive a response within 2 business days.   Please advise at Mobile [phone] (mobile)

## 2023-06-01 DIAGNOSIS — M170 Bilateral primary osteoarthritis of knee: Secondary | ICD-10-CM | POA: Diagnosis not present

## 2023-06-08 DIAGNOSIS — M170 Bilateral primary osteoarthritis of knee: Secondary | ICD-10-CM | POA: Diagnosis not present

## 2023-06-15 DIAGNOSIS — M170 Bilateral primary osteoarthritis of knee: Secondary | ICD-10-CM | POA: Diagnosis not present

## 2023-06-16 ENCOUNTER — Telehealth: Payer: Self-pay

## 2023-06-16 MED ORDER — OXYCODONE HCL 5 MG PO TABS: ORAL_TABLET | ORAL | 0 refills | Status: DC

## 2023-06-16 NOTE — Telephone Encounter (Signed)
Prescription Request  06/16/2023  LOV: Visit date not found  What is the name of the medication or equipment? oxyCODONE (OXY IR/ROXICODONE) 5 MG immediate release tablet   Have you contacted your pharmacy to request a refill? Yes   Which pharmacy would you like this sent to?  [phone] [place] - [address] [place] [address] Phone: [phone] Fax: [phone]    Patient notified that their request is being sent to the clinical staff for review and that they should receive a response within 2 business days.   Please advise at Mobile [phone] (mobile)

## 2023-06-16 NOTE — Telephone Encounter (Signed)
Ok rx sent.

## 2023-06-16 NOTE — Telephone Encounter (Signed)
[facility] [place]. Next OV 12/18

## 2023-06-18 ENCOUNTER — Other Ambulatory Visit: Payer: Self-pay | Admitting: Family Medicine

## 2023-06-18 DIAGNOSIS — R69 Illness, unspecified: Secondary | ICD-10-CM | POA: Diagnosis not present

## 2023-06-19 DIAGNOSIS — M21611 Bunion of right foot: Secondary | ICD-10-CM | POA: Diagnosis not present

## 2023-06-19 DIAGNOSIS — M2021 Hallux rigidus, right foot: Secondary | ICD-10-CM | POA: Diagnosis not present

## 2023-06-28 ENCOUNTER — Other Ambulatory Visit: Payer: Self-pay | Admitting: Family Medicine

## 2023-07-01 ENCOUNTER — Ambulatory Visit: Payer: Medicare HMO | Admitting: Family Medicine

## 2023-07-03 ENCOUNTER — Other Ambulatory Visit: Payer: Self-pay | Admitting: Family Medicine

## 2023-07-03 NOTE — Telephone Encounter (Unsigned)
Copied from CRM [id number]. Topic: Clinical - Medication Refill >> Jul 03, 2023 11:45 AM Elizebeth Brooking wrote: Most Recent Primary Care Visit:  Provider: Jeoffrey Massed  Department: [facility]  Visit Type: OFFICE VISIT  Date: 04/30/2023  Medication: ***  Has the patient contacted their pharmacy? {yes/no:20286} (Agent: If no, request that the patient contact the pharmacy for the refill. If patient does not wish to contact the pharmacy document the reason why and proceed with request.) (Agent: If yes, when and what did the pharmacy advise?)  Is this the correct pharmacy for this prescription? {yes/no:20286} If no, delete pharmacy and type the correct one.  This is the patient's preferred pharmacy:  [id number] [place] [place] - [address] [place] [address] Phone: [phone] Fax: [phone]   Has the prescription been filled recently? {yes/no:20286}  Is the patient out of the medication? {yes/no:20286}  Has the patient been seen for an appointment in the last year OR does the patient have an upcoming appointment? {yes/no:20286}  Can we respond through MyChart? {yes/no:20286}  Agent: Please be advised that Rx refills may take up to 3 business days. We ask that you follow-up with your pharmacy.

## 2023-07-04 MED ORDER — PHENTERMINE HCL 37.5 MG PO CAPS: 37.5000 mg | ORAL_CAPSULE | ORAL | 2 refills | Status: DC

## 2023-07-17 ENCOUNTER — Ambulatory Visit (INDEPENDENT_AMBULATORY_CARE_PROVIDER_SITE_OTHER): Payer: Medicare HMO | Admitting: Family Medicine

## 2023-07-17 ENCOUNTER — Telehealth: Payer: Self-pay | Admitting: Family Medicine

## 2023-07-17 ENCOUNTER — Encounter: Payer: Self-pay | Admitting: Family Medicine

## 2023-07-17 VITALS — BP 126/73 | HR 74 | Wt 188.4 lb

## 2023-07-17 DIAGNOSIS — M797 Fibromyalgia: Secondary | ICD-10-CM | POA: Diagnosis not present

## 2023-07-17 DIAGNOSIS — N1831 Chronic kidney disease, stage 3a: Secondary | ICD-10-CM

## 2023-07-17 DIAGNOSIS — Z905 Acquired absence of kidney: Secondary | ICD-10-CM | POA: Diagnosis not present

## 2023-07-17 DIAGNOSIS — G894 Chronic pain syndrome: Secondary | ICD-10-CM

## 2023-07-17 DIAGNOSIS — M170 Bilateral primary osteoarthritis of knee: Secondary | ICD-10-CM

## 2023-07-17 DIAGNOSIS — I10 Essential (primary) hypertension: Secondary | ICD-10-CM | POA: Diagnosis not present

## 2023-07-17 MED ORDER — GABAPENTIN 300 MG PO CAPS: ORAL_CAPSULE | ORAL | 1 refills | Status: DC

## 2023-07-17 MED ORDER — OXYCODONE HCL 5 MG PO TABS: ORAL_TABLET | ORAL | 0 refills | Status: DC

## 2023-07-17 NOTE — Progress Notes (Signed)
 OFFICE VISIT  07/17/2023  CC:  Chief Complaint  Patient presents with   Medical Management of Chronic Issues    Patient is a 68 y.o. female who presents for 24-month follow-up hypertension, weight management, chronic renal insufficiency stage III, and chronic pain syndrome.  I last saw her 04/30/23 for HTN f/u. A/P as of that visit: #1 hypertension, well-controlled on Toprol -XL 50 mg a day and amlodipine  10 mg a day.   #2 atypical chest pain. Suspect this is esophageal pain related to her reflux. However, she expresses interest in possibly looking into a stress test in the future.  She wants to hold off for now and try to pay attention to the episodes more to see if any exacerbating factors that she is not recognizing and/or any additional symptoms with the pain.  INTERIM HX: [name] is still struggling with a lot of chronic pain. Knees are the main area but also in the neck and shoulders diffusely. Ortho doing viscosupp inj's in both knees, no effect yet.  Indication for chronic opioid: chronic lumbar and bilat knee osteoarthritis , spine arthritis+ fibromyalgia pain that I treat with pain meds in order to maximize functioning and quality of life.  Not NSAID candidate due to chronic renal insufficiency. She is also taking cymbalta  60mg  qd and gabapentin  600 mg nightly usually.  Also Flexeril  pretty regularly. PMP AWARE reviewed today: most recent rx for oxycodone  5 mg was filled 06/16/2023, # 90, rx by me. Most recent phentermine  37.5 mg prescription filled 07/04/2023, #30, prescription by me. No red flags.  ROS as above, plus--> no fevers, no CP, no SOB, no wheezing, no cough, no dizziness, no HAs, no rashes, no melena/hematochezia.  No polyuria or polydipsia.  No focal weakness, paresthesias, or tremors.  No acute vision or hearing abnormalities.  No dysuria or unusual/new urinary urgency or frequency.  No recent changes in lower legs. No n/v/d or abd pain.  No palpitations.    Past  Medical History:  Diagnosis Date   Allergy    Bilateral carpal tunnel syndrome    pt to get bilat carpal tunnel release as of 09/2018 ortho eval   Bilateral primary osteoarthritis of knee    L tricompart, R medial compart   BPPV (benign paroxysmal positional vertigo)    Cancer (HCC)    RIGHT KIDNEY   Cataract    Chronic pain syndrome    Chronic renal insufficiency, stage III (moderate) (HCC)    GFR 50's   Fibromyalgia    GERD (gastroesophageal reflux disease)    Heart murmur    Hemorrhoids    History of bronchitis    childhood, wood stove and coal heat   History of galactorrhea 02/05/2011   Hyperlipidemia, mixed    Hypertension    IFG (impaired fasting glucose) 2016   HbA1c 5.7-5.8.   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 Woodroe sign of recurrent dz as of this 05/2016 f/u.  Released from surveillance as of 06/03/16; return to urol prn.   Seasonal allergic rhinitis    Solitary left kidney 2013   Aquired: right nephrectomy for renal cancer    Past Surgical History:  Procedure Laterality Date   CARPAL TUNNEL RELEASE Right 09/2018   CESAREAN SECTION  1983   COLONOSCOPY  04/2013   Normal 2014.  01/2022 normal.  Recall 34yrs d/t FH CRCA   DEXA     GYN scheduled this at 05/12/19 f/u.   ROBOT ASSISTED LAPAROSCOPIC NEPHRECTOMY  07/12/2012   Procedure: ROBOTIC ASSISTED LAPAROSCOPIC NEPHRECTOMY;  Surgeon: Ricardo Likens, MD;  Location: [facility];  Service: Urology;  Laterality: Right;   TONSILLECTOMY     TUBAL LIGATION      Outpatient Medications Prior to Visit  Medication Sig Dispense Refill   acetaminophen  (TYLENOL ) 500 MG tablet Take 1,000 mg by mouth every 8 (eight) hours as needed.     albuterol  (VENTOLIN  HFA) 108 (90 Base) MCG/ACT inhaler INHALE 2 PUFFS INTO THE LUNGS EVERY 6  HOURS AS NEEDED FOR WHEEZE OR SHORTNESS OF BREATH 18 g 0   amLODipine  (NORVASC ) 10 MG tablet TAKE 1 TABLET BY MOUTH EVERY DAY 30 tablet 1   atorvastatin  (LIPITOR) 80 MG tablet TAKE 1 TABLET BY MOUTH EVERY DAY 90 tablet 1   cyclobenzaprine  (FLEXERIL ) 10 MG tablet TAKE 1 TABLET BY MOUTH THREE TIMES A DAY AS NEEDED 90 tablet 5   diphenhydramine-acetaminophen  (TYLENOL  PM) 25-500 MG TABS Take 1 tablet by mouth at bedtime as needed.     DULoxetine  (CYMBALTA ) 60 MG capsule Take 1 capsule (60 mg total) by mouth daily. TAKE 1 CAPSULE BY MOUTH EVERY DAY 90 capsule 1   fluconazole  (DIFLUCAN ) 150 MG tablet Take 1 tablet (150 mg total) by mouth daily. 1 tab po x 1 day 1 tablet 0   metoprolol  succinate (TOPROL  XL) 50 MG 24 hr tablet Take 1 tablet (50 mg total) by mouth daily. Take with or immediately following a meal. 90 tablet 3   Multiple Vitamin (MULTIVITAMIN WITH MINERALS) TABS Take 1 tablet by mouth daily.     mupirocin  ointment (BACTROBAN ) 2 % Apply 1 Application topically 3 (three) times daily. 15 g 0   OVER THE COUNTER MEDICATION Take 2 tablets by mouth daily. Energize Caffeine Pills Fast Acting Energy Pill     pantoprazole  (PROTONIX ) 40 MG tablet Take 1 tablet (40 mg total) by mouth daily. 90 tablet 1   phentermine  37.5 MG capsule Take 1 capsule (37.5 mg total) by mouth every morning. 30 capsule 2   promethazine  (PHENERGAN ) 12.5 MG tablet 1-2 tabs po q6h prn nausea 30 tablet 0   sennosides-docusate sodium  (SENOKOT-S) 8.6-50 MG tablet Take 1 tablet by mouth 2 (two) times daily. While taking pain meds to prevent constipation 30 tablet 0   Wheat Dextrin (BENEFIBER PO) Take by mouth.     Dextromethorphan -guaiFENesin  5-100 MG PACK 1 tab po bid prn cough 20 each 1   gabapentin  (NEURONTIN ) 300 MG capsule TAKE 2 CAPSULES BY MOUTH 3 TIMES A DAY 540 capsule 1   oxyCODONE  (OXY IR/ROXICODONE ) 5 MG immediate release tablet 1-2 tabs po q6h prn 90 tablet 0   No facility-administered medications prior to visit.     Allergies  Allergen Reactions   Fenofibrate  Rash   Codeine Nausea Only   Dilaudid  [Hydromorphone  Hcl] Nausea And Vomiting    Review of Systems As per HPI  PE:    07/17/2023    2:06 PM 04/30/2023    1:23 PM 04/23/2023   11:32 AM  Vitals with BMI  Weight 188 lbs 6 oz 184 lbs 6 oz   BMI  33.72   Systolic 126 103 863  Diastolic 73 68 70  Pulse 74 77    Body mass index is 34.46 kg/m.   Physical Exam  Gen: Alert, well appearing.  Patient is oriented to person, place,  time, and situation. AFFECT: pleasant, lucid thought and speech. No further exam today  LABS:  Last CBC Lab Results  Component Value Date   WBC 8.9 07/09/2022   HGB 14.0 07/09/2022   HCT 40.5 07/09/2022   MCV 91.8 07/09/2022   MCH 31.7 07/09/2022   RDW 12.6 07/09/2022   PLT 310 07/09/2022   Last metabolic panel Lab Results  Component Value Date   GLUCOSE 92 04/09/2023   NA 137 04/09/2023   K 4.5 04/09/2023   CL 100 04/09/2023   CO2 30 04/09/2023   BUN 17 04/09/2023   CREATININE 1.08 (H) 04/09/2023   GFR 48.41 (L) 03/19/2022   CALCIUM  9.4 04/09/2023   PHOS 3.5 02/04/2012   PROT 6.3 12/23/2022   ALBUMIN 4.6 06/17/2021   BILITOT 0.5 12/23/2022   ALKPHOS 73 06/17/2021   AST 17 12/23/2022   ALT 16 12/23/2022   Last lipids Lab Results  Component Value Date   CHOL 177 12/23/2022   HDL 69 12/23/2022   LDLCALC 76 12/23/2022   LDLDIRECT 125.0 06/17/2021   TRIG 219 (H) 12/23/2022   CHOLHDL 2.6 12/23/2022   Last hemoglobin A1c Lab Results  Component Value Date   HGBA1C 5.8 (H) 12/23/2022   Last thyroid  functions Lab Results  Component Value Date   TSH 0.93 06/17/2021   IMPRESSION AND PLAN:  #1 chronic pain syndrome.  Chronic bilateral knee osteoarthritic pain.  Fibromyalgia. All pretty stable from a pain medication standpoint.  Refilled oxycodone  5 mg, 1-2 every 6 hours as needed, #90.  Continue gabapentin  600 mg 3 times daily and Cymbalta  60 mg a day. Urine drug screen  today. She is followed by Ortho for her knees and is getting viscosupplementation injections. Hard to tell if this is helping.  She thinks she will probably eventually proceed with bilateral knee replacements.  2.  Hypertension, well-controlled on amlodipine  10 mg a day and Toprol -XL 50 mg a day. Electrolytes and creatinine monitoring today.  3.  Chronic renal insufficiency stage IIIa, patient with solitary kidney. Monitor renal function today.  She has nephrology follow-up 08/05/2023. She avoids NSAIDs and makes effort to hydrate adequately.  4.  Class I obesity, BMI 34, weight management. She continues to struggle.  Weight is up 4 pounds over the last 2 months. She has not maintained a good diet over the last couple of months due to the holidays. She exercises a couple times a week despite her pain. She takes phentermine  37.5 mg daily.  She elects to stay on this medication right now.  An After Visit Summary was printed and given to the patient.  FOLLOW UP: Return in about 3 months (around 10/15/2023) for routine chronic illness f/u.  Signed:  Gerlene Hockey, MD           07/17/2023

## 2023-07-17 NOTE — Telephone Encounter (Signed)
 Med was sent today during appt.

## 2023-07-17 NOTE — Telephone Encounter (Signed)
 Patient forgot to request medication refill on   oxyCODONE (OXY IR/ROXICODONE) 5 MG immediate release tablet  during her appointment today

## 2023-07-18 ENCOUNTER — Encounter: Payer: Self-pay | Admitting: Family Medicine

## 2023-07-20 LAB — BASIC METABOLIC PANEL
BUN/Creatinine Ratio: 11 (calc) (ref 6–22)
BUN: 14 mg/dL (ref 7–25)
CO2: 27 mmol/L (ref 20–32)
Calcium: 9.5 mg/dL (ref 8.6–10.4)
Chloride: 101 mmol/L (ref 98–110)
Creat: 1.33 mg/dL — ABNORMAL HIGH (ref 0.50–1.05)
Glucose, Bld: 103 mg/dL — ABNORMAL HIGH (ref 65–99)
Potassium: 4.5 mmol/L (ref 3.5–5.3)
Sodium: 138 mmol/L (ref 135–146)

## 2023-07-20 LAB — DRUG MONITORING PANEL 376104, URINE
Amphetamines: NEGATIVE ng/mL (ref <500)
Barbiturates: NEGATIVE ng/mL (ref <300)
Benzodiazepines: NEGATIVE ng/mL (ref <100)
Cocaine Metabolite: NEGATIVE ng/mL (ref <150)
Codeine: NEGATIVE ng/mL (ref <50)
Desmethyltramadol: NEGATIVE ng/mL (ref <100)
Hydrocodone: NEGATIVE ng/mL (ref <50)
Hydromorphone: NEGATIVE ng/mL (ref <50)
Morphine: NEGATIVE ng/mL (ref <50)
Norhydrocodone: NEGATIVE ng/mL (ref <50)
Noroxycodone: 2224 ng/mL — ABNORMAL HIGH (ref <50)
Opiates: NEGATIVE ng/mL (ref <100)
Oxycodone: 1083 ng/mL — ABNORMAL HIGH (ref <50)
Oxycodone: POSITIVE ng/mL — AB (ref <100)
Oxymorphone: 842 ng/mL — ABNORMAL HIGH (ref <50)
Tramadol: NEGATIVE ng/mL (ref <100)

## 2023-07-20 LAB — DM TEMPLATE

## 2023-07-26 ENCOUNTER — Other Ambulatory Visit: Payer: Self-pay | Admitting: Family Medicine

## 2023-08-05 ENCOUNTER — Encounter: Payer: Medicare HMO | Admitting: Family Medicine

## 2023-08-05 ENCOUNTER — Other Ambulatory Visit (HOSPITAL_COMMUNITY)
Admission: RE | Admit: 2023-08-05 | Discharge: 2023-08-05 | Disposition: A | Discharge status: Home / Self Care | Payer: Self-pay | Source: Ambulatory Visit | Attending: Medical Genetics | Admitting: Medical Genetics

## 2023-08-05 ENCOUNTER — Other Ambulatory Visit: Payer: Self-pay | Admitting: Medical Genetics

## 2023-08-05 DIAGNOSIS — R7303 Prediabetes: Secondary | ICD-10-CM | POA: Diagnosis not present

## 2023-08-05 DIAGNOSIS — Z006 Encounter for examination for normal comparison and control in clinical research program: Secondary | ICD-10-CM | POA: Insufficient documentation

## 2023-08-05 DIAGNOSIS — N1831 Chronic kidney disease, stage 3a: Secondary | ICD-10-CM | POA: Diagnosis not present

## 2023-08-05 DIAGNOSIS — I129 Hypertensive chronic kidney disease with stage 1 through stage 4 chronic kidney disease, or unspecified chronic kidney disease: Secondary | ICD-10-CM | POA: Diagnosis not present

## 2023-08-05 NOTE — Progress Notes (Deleted)
Office Note 08/05/2023  CC: No chief complaint on file.  Patient is a 68 y.o. female who is here for annual health maintenance exam. I last saw her approximately 3 weeks ago. A/P as of that visit: "#1 chronic pain syndrome.  Chronic bilateral knee osteoarthritic pain.  Fibromyalgia. All pretty stable from a pain medication standpoint.  Refilled oxycodone 5 mg, 1-2 every 6 hours as needed, #90.  Continue gabapentin 600 mg 3 times daily and Cymbalta 60 mg a day. Urine drug screen today. She is followed by Ortho for her knees and is getting viscosupplementation injections. Hard to tell if this is helping.  She thinks she will probably eventually proceed with bilateral knee replacements.   2.  Hypertension, well-controlled on amlodipine 10 mg a day and Toprol-XL 50 mg a day. Electrolytes and creatinine monitoring today.   3.  Chronic renal insufficiency stage IIIa, patient with solitary kidney. Monitor renal function today.  She has nephrology follow-up 08/05/2023. She avoids NSAIDs and makes effort to hydrate adequately.   4.  Class I obesity, BMI 34, weight management. She continues to struggle.  Weight is up 4 pounds over the last 2 months. She has not maintained a good diet over the last couple of months due to the holidays. She exercises a couple times a week despite her pain. She takes phentermine 37.5 mg daily.  She elects to stay on this medication right now."  INTERIM HX: ***  Past Medical History:  Diagnosis Date   Bilateral carpal tunnel syndrome    pt to get bilat carpal tunnel release as of 09/2018 ortho eval   Bilateral primary osteoarthritis of knee    L tricompart, R medial compart   BPPV (benign paroxysmal positional vertigo)    Cataract    Chronic pain syndrome    Chronic renal insufficiency, stage III (moderate) (HCC)    GFR 50's   Fibromyalgia    GERD (gastroesophageal reflux disease)    Heart murmur    History of galactorrhea 02/05/2011   History of  kidney cancer    2013   Hyperlipidemia, mixed    Hypertension    IFG (impaired fasting glucose) 2016   HbA1c 5.7-5.8.   Knee pain, bilateral 02/05/2011   Lumbar spondylosis    Overweight(278.02)    Seasonal allergic rhinitis    Solitary left kidney 2013   Aquired: right nephrectomy for renal cancer    Past Surgical History:  Procedure Laterality Date   CARPAL TUNNEL RELEASE Right 09/2018   CESAREAN SECTION  1983   COLONOSCOPY  04/2013   Normal 2014.  01/2022 normal. Recall 65yrs d/t FH CRCA   DEXA     GYN scheduled this at 05/12/19 f/u.   ROBOT ASSISTED LAPAROSCOPIC NEPHRECTOMY  07/12/2012   Procedure: ROBOTIC ASSISTED LAPAROSCOPIC NEPHRECTOMY;  Surgeon: Sebastian Ache, MD;  Location: [facility];  Service: Urology;  Laterality: Right;   TONSILLECTOMY     TUBAL LIGATION      Family History  Problem Relation Age of Onset   Thyroid disease Mother    Heart disease Mother        CHF, heart murmur, PAD   Kidney disease Mother        RAS   Other Mother 75       bowel obstruction,surgery this year   Colon cancer Father 63   Cancer Sister        cervical, breast   Thyroid disease Sister    Thyroid disease Sister    Diabetes  Sister    Diabetes Sister    Hypertension Sister    Cancer Sister        kidney   Heart disease Brother    Cancer Paternal Aunt        breast cancer   Heart disease Maternal Grandmother    Thyroid disease Maternal Grandmother    Thyroid disease Maternal Grandfather    Cancer Maternal Grandfather    Diabetes Paternal Grandmother    Cancer Paternal Grandmother        breast   Heart disease Paternal Grandmother    Heart disease Paternal Grandfather    Diabetes Paternal Grandfather    Hypertension Paternal Grandfather    Asthma Daughter    Allergies Daughter    Lung disease Daughter        recurrent bronchitis   Lung disease Son        recurrent bronchitis   Colon cancer Paternal Great-grandmother    Crohn's disease Neg Hx    Esophageal cancer Neg  Hx    Rectal cancer Neg Hx    Stomach cancer Neg Hx     Social History   Socioeconomic History   Marital status: Married    Spouse name: Sharl Ma   Number of children: 3   Years of education: Not on file   Highest education level: Not on file  Occupational History   Not on file  Tobacco Use   Smoking status: Former    Current packs/day: 0.00    Average packs/day: 2.0 packs/day for 5.0 years (10.0 ttl pk-yrs)    Types: Cigarettes    Start date: 07/14/1976    Quit date: 07/14/1981    Years since quitting: 42.0    Passive exposure: Never   Smokeless tobacco: Never  Vaping Use   Vaping status: Never Used  Substance and Sexual Activity   Alcohol use: No   Drug use: No   Sexual activity: Yes  Other Topics Concern   Not on file  Social History Narrative   Married, 3 children.   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.  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   No tobacco, alcohol, or drugs.  No exercise.   Social Drivers of Corporate investment banker Strain: Not on file  Food Insecurity: Not on file  Transportation Needs: Not on file  Physical Activity: Not on file  Stress: Not on file  Social Connections: Not on file  Intimate Partner Violence: Not on file    Outpatient Medications Prior to Visit  Medication Sig Dispense Refill   acetaminophen (TYLENOL) 500 MG tablet Take 1,000 mg by mouth every 8 (eight) hours as needed.     albuterol (VENTOLIN HFA) 108 (90 Base) MCG/ACT inhaler INHALE 2 PUFFS INTO THE LUNGS EVERY 6 HOURS AS NEEDED FOR WHEEZE OR SHORTNESS OF BREATH 18 g 0   amLODipine (NORVASC) 10 MG tablet TAKE 1 TABLET BY MOUTH EVERY DAY 30 tablet 1   atorvastatin (LIPITOR) 80 MG tablet TAKE 1 TABLET BY MOUTH EVERY DAY 90 tablet 1   cyclobenzaprine (FLEXERIL) 10 MG tablet TAKE 1 TABLET BY MOUTH THREE TIMES A DAY AS NEEDED 90 tablet 5   diphenhydramine-acetaminophen (TYLENOL PM)  25-500 MG TABS Take 1 tablet by mouth at bedtime as needed.     DULoxetine (CYMBALTA) 60 MG capsule Take 1 capsule (60 mg total) by mouth daily. TAKE 1 CAPSULE BY MOUTH EVERY DAY  90 capsule 1   fluconazole (DIFLUCAN) 150 MG tablet Take 1 tablet (150 mg total) by mouth daily. 1 tab po x 1 day 1 tablet 0   gabapentin (NEURONTIN) 300 MG capsule TAKE 2 CAPSULES BY MOUTH 3 TIMES A DAY 540 capsule 1   metoprolol succinate (TOPROL XL) 50 MG 24 hr tablet Take 1 tablet (50 mg total) by mouth daily. Take with or immediately following a meal. 90 tablet 3   Multiple Vitamin (MULTIVITAMIN WITH MINERALS) TABS Take 1 tablet by mouth daily.     mupirocin ointment (BACTROBAN) 2 % Apply 1 Application topically 3 (three) times daily. 15 g 0   OVER THE COUNTER MEDICATION Take 2 tablets by mouth daily. Energize Caffeine Pills Fast Acting Energy Pill     oxyCODONE (OXY IR/ROXICODONE) 5 MG immediate release tablet 1-2 tabs po q6h prn 90 tablet 0   pantoprazole (PROTONIX) 40 MG tablet Take 1 tablet (40 mg total) by mouth daily. 90 tablet 1   phentermine 37.5 MG capsule Take 1 capsule (37.5 mg total) by mouth every morning. 30 capsule 2   promethazine (PHENERGAN) 12.5 MG tablet 1-2 tabs po q6h prn nausea 30 tablet 0   sennosides-docusate sodium (SENOKOT-S) 8.6-50 MG tablet Take 1 tablet by mouth 2 (two) times daily. While taking pain meds to prevent constipation 30 tablet 0   Wheat Dextrin (BENEFIBER PO) Take by mouth.     No facility-administered medications prior to visit.    Allergies  Allergen Reactions   Fenofibrate Rash   Codeine Nausea Only   Dilaudid [Hydromorphone Hcl] Nausea And Vomiting    Review of Systems *** PE;    07/17/2023    2:06 PM 04/30/2023    1:23 PM 04/23/2023   11:32 AM  Vitals with BMI  Weight 188 lbs 6 oz 184 lbs 6 oz   BMI  33.72   Systolic 126 103 914  Diastolic 73 68 70  Pulse 74 77      *** Pertinent labs:  Lab Results  Component Value Date   TSH 0.93 06/17/2021    Lab Results  Component Value Date   WBC 8.9 07/09/2022   HGB 14.0 07/09/2022   HCT 40.5 07/09/2022   MCV 91.8 07/09/2022   PLT 310 07/09/2022   Lab Results  Component Value Date   CREATININE 1.33 (H) 07/17/2023   BUN 14 07/17/2023   NA 138 07/17/2023   K 4.5 07/17/2023   CL 101 07/17/2023   CO2 27 07/17/2023   Lab Results  Component Value Date   ALT 16 12/23/2022   AST 17 12/23/2022   ALKPHOS 73 06/17/2021   BILITOT 0.5 12/23/2022   Lab Results  Component Value Date   CHOL 177 12/23/2022   Lab Results  Component Value Date   HDL 69 12/23/2022   Lab Results  Component Value Date   LDLCALC 76 12/23/2022   Lab Results  Component Value Date   TRIG 219 (H) 12/23/2022   Lab Results  Component Value Date   CHOLHDL 2.6 12/23/2022   Lab Results  Component Value Date   HGBA1C 5.8 (H) 12/23/2022   ASSESSMENT AND PLAN:   No problem-specific Assessment & Plan notes found for this encounter.  #1 health maintenance exam: Reviewed age and gender appropriate health maintenance issues (prudent diet, regular exercise, health risks of tobacco and excessive alcohol, use of seatbelts, fire alarms in home, use of sunscreen).  Also reviewed age and gender appropriate health screening as well as  vaccine recommendations. Vaccines: All UTD. Labs: cbc, lipids, cmet Cervical ca screening: UTD Per GYN, Dr. Marcelle Overlie.*** Breast ca screening: UTD Per GYN, Dr. Marcelle Overlie.*** DEXA: UTD per gyn*** Colon ca screening: recall 01/2027  An After Visit Summary was printed and given to the patient.  FOLLOW UP:  No follow-ups on file.  Signed:  Santiago Bumpers, MD           08/05/2023

## 2023-08-05 NOTE — Progress Notes (Signed)
New order needed. Confirmed consent on file

## 2023-08-06 ENCOUNTER — Other Ambulatory Visit: Payer: Self-pay | Admitting: Nephrology

## 2023-08-06 DIAGNOSIS — N1831 Chronic kidney disease, stage 3a: Secondary | ICD-10-CM

## 2023-08-07 ENCOUNTER — Telehealth: Payer: Self-pay | Admitting: Family Medicine

## 2023-08-07 ENCOUNTER — Other Ambulatory Visit: Payer: Self-pay | Admitting: Family Medicine

## 2023-08-07 MED ORDER — AMOXICILLIN-POT CLAVULANATE 875-125 MG PO TABS: 1.0000 | ORAL_TABLET | Freq: Two times a day (BID) | ORAL | 0 refills | Status: DC

## 2023-08-07 NOTE — Telephone Encounter (Signed)
[name] came in with her husband today. She states that she has significant pain in the gums and teeth diffusely in the lower jaw.  History of significant periodontal disease and needs periodontal surgery but currently cannot afford it. She has a history of dental abscess in the past. No fevers.  Today I went ahead and sent in Augmentin 875, 1 twice daily x 10 days.

## 2023-08-08 ENCOUNTER — Other Ambulatory Visit: Payer: Self-pay | Admitting: Family Medicine

## 2023-08-11 ENCOUNTER — Other Ambulatory Visit: Payer: Self-pay | Admitting: Family Medicine

## 2023-08-11 MED ORDER — DULOXETINE HCL 60 MG PO CPEP: 60.0000 mg | ORAL_CAPSULE | Freq: Every day | ORAL | 0 refills | Status: DC

## 2023-08-11 NOTE — Telephone Encounter (Signed)
Copied from CRM [id number]. Topic: Clinical - Medication Refill >> Aug 11, 2023 11:59 AM Drema Balzarine wrote: Most Recent Primary Care Visit:  Provider: Jeoffrey Massed  Department: [facility]  Visit Type: OFFICE VISIT  Date: 07/17/2023  Medication: DULoxetine   Has the patient contacted their pharmacy? Yes, called them 3 times their waiting for dotors response   (Agent: If no, request that the patient contact the pharmacy for the refill. If patient does not wish to contact the pharmacy document the reason why and proceed with request.) (Agent: If yes, when and what did the pharmacy advise?)  Is this the correct pharmacy for this prescription? Yes If no, delete pharmacy and type the correct one.  This is the patient's preferred pharmacy:   [id number] - [place], [place] - [address] [place] [address] Phone: [phone] Fax: [phone]   Has the prescription been filled recently? Yes  Is the patient out of the medication? Yes, ran out last Friday but was given 3 pills, says today would be her last day.   Has the patient been seen for an appointment in the last year OR does the patient have an upcoming appointment? Yes  Can we respond through MyChart? no  Agent: Please be advised that Rx refills may take up to 3 business days. We ask that you follow-up with your pharmacy.

## 2023-08-14 LAB — GENECONNECT MOLECULAR SCREEN: Genetic Analysis Overall Interpretation: NEGATIVE

## 2023-08-19 ENCOUNTER — Other Ambulatory Visit: Payer: Self-pay | Admitting: Family Medicine

## 2023-08-19 MED ORDER — OXYCODONE HCL 5 MG PO TABS: ORAL_TABLET | ORAL | 0 refills | Status: DC

## 2023-08-19 MED ORDER — PHENTERMINE HCL 37.5 MG PO CAPS: 37.5000 mg | ORAL_CAPSULE | ORAL | 2 refills | Status: DC

## 2023-08-19 NOTE — Telephone Encounter (Signed)
 Last Fill: Oxycodone : 07/17/23     Phentermine : 07/04/23  Last OV: 07/17/23 Next OV: 10/15/23  Routing to provider for review/authorization.

## 2023-08-19 NOTE — Telephone Encounter (Signed)
 Copied from CRM [id number]. Topic: Clinical - Medication Refill >> Aug 19, 2023 12:15 PM Taleah C wrote: Most Recent Primary Care Visit:  Provider: CANDISE ALEENE DEL  Department: [facility]  Visit Type: OFFICE VISIT  Date: 07/17/2023  Medication: oxyCODONE  (OXY IR/ROXICODONE ) 5 MG immediate release tablet) phentermine  37.5 MG capsule  Has the patient contacted their pharmacy? Yes  Is this the correct pharmacy for this prescription? Yes  This is the patient's preferred pharmacy:  [id number] - [place], [place] - [address] [place] [address] Phone: [phone] Fax: [phone]   Has the prescription been filled recently? Yes  Is the patient out of the medication? No  Has the patient been seen for an appointment in the last year OR does the patient have an upcoming appointment? No  Can we respond through MyChart? Yes  Agent: Please be advised that Rx refills may take up to 3 business days. We ask that you follow-up with your pharmacy.

## 2023-08-19 NOTE — Telephone Encounter (Signed)
 Copied from CRM [id number]. Topic: Clinical - Medication Refill >> Aug 19, 2023 12:11 PM Deaijah H wrote: Most Recent Primary Care Visit:  Provider: CANDISE ALEENE DEL  Department: [facility]  Visit Type: OFFICE VISIT  Date: 07/17/2023  Medication: phentermine  37.5 MG capsule/oxyCODONE  (OXY IR/ROXICODONE ) 5 MG immediate release tablet  Has the patient contacted their pharmacy? Yes (Agent: If no, request that the patient contact the pharmacy for the refill. If patient does not wish to contact the pharmacy document the reason why and proceed with request.) no (Agent: If yes, when and what did the pharmacy advise?)  Is this the correct pharmacy for this prescription? Yes If no, delete pharmacy and type the correct one.  This is the patient's preferred pharmacy:  [id number] - [place], [place] - [address] [place] [address] Phone: [phone] Fax: [phone]   Has the prescription been filled recently? Yes  Is the patient out of the medication? No, 2 left  Has the patient been seen for an appointment in the last year OR does the patient have an upcoming appointment? Yes  Can we respond through MyChart? Yes  Agent: Please be advised that Rx refills may take up to 3 business days. We ask that you follow-up with your pharmacy.

## 2023-08-26 DIAGNOSIS — N760 Acute vaginitis: Secondary | ICD-10-CM | POA: Diagnosis not present

## 2023-08-26 DIAGNOSIS — R300 Dysuria: Secondary | ICD-10-CM | POA: Diagnosis not present

## 2023-08-26 DIAGNOSIS — N898 Other specified noninflammatory disorders of vagina: Secondary | ICD-10-CM | POA: Diagnosis not present

## 2023-08-26 DIAGNOSIS — N950 Postmenopausal bleeding: Secondary | ICD-10-CM | POA: Diagnosis not present

## 2023-08-30 ENCOUNTER — Other Ambulatory Visit: Payer: Self-pay | Admitting: Family Medicine

## 2023-09-23 ENCOUNTER — Other Ambulatory Visit: Payer: Self-pay | Admitting: Family Medicine

## 2023-09-23 DIAGNOSIS — R9389 Abnormal findings on diagnostic imaging of other specified body structures: Secondary | ICD-10-CM | POA: Diagnosis not present

## 2023-09-23 DIAGNOSIS — N950 Postmenopausal bleeding: Secondary | ICD-10-CM | POA: Diagnosis not present

## 2023-09-23 DIAGNOSIS — N939 Abnormal uterine and vaginal bleeding, unspecified: Secondary | ICD-10-CM | POA: Diagnosis not present

## 2023-09-23 NOTE — Telephone Encounter (Unsigned)
 Copied from CRM [id number]. Topic: Clinical - Medication Refill >> Sep 23, 2023  4:29 PM Ernst Spell wrote: Most Recent Primary Care Visit:  Provider: Jeoffrey Massed  Department: [facility]  Visit Type: OFFICE VISIT  Date: 07/17/2023  Medication: oxycodone  Has the patient contacted their pharmacy? No Stated she's never done it that way  Is this the correct pharmacy for this prescription? Yes If no, delete pharmacy and type the correct one.  This is the patient's preferred pharmacy:  [id number] - [place], [place] - [address] [place] [address] Phone: [phone] Fax: [phone]   Has the prescription been filled recently? Yes  Is the patient out of the medication? Yes  Has the patient been seen for an appointment in the last year OR does the patient have an upcoming appointment? Yes  Can we respond through MyChart? No  Agent: Please be advised that Rx refills may take up to 3 business days. We ask that you follow-up with your pharmacy.

## 2023-09-23 NOTE — Telephone Encounter (Signed)
 Last Fill: 08/19/23 90 tabs/0 RF  Last OV: 07/17/23 Next OV: 10/15/23  Routing to provider for review/authorization.

## 2023-09-23 NOTE — Telephone Encounter (Signed)
 Copied from CRM [id number]. Topic: Clinical - Medication Refill >> Sep 23, 2023  4:17 PM Eunice Blase wrote: Most Recent Primary Care Visit:  Provider: Jeoffrey Massed  Department: [facility]  Visit Type: OFFICE VISIT  Date: 07/17/2023  Medication: oxyCODONE (OXY IR/ROXICODONE) 5 MG immediate release tablet  Has the patient contacted their pharmacy? Yes (Agent: If no, request that the patient contact the pharmacy for the refill. If patient does not wish to contact the pharmacy document the reason why and proceed with request.) (Agent: If yes, when and what did the pharmacy advise?)Pharmacy needs PCP approval  Is this the correct pharmacy for this prescription? Yes If no, delete pharmacy and type the correct one.  This is the patient's preferred pharmacy:  [id number] - [place], [place] - [address] [place] [address] Phone: [phone] Fax: [phone]   Has the prescription been filled recently? Yes  Is the patient out of the medication? Yes  Has the patient been seen for an appointment in the last year OR does the patient have an upcoming appointment? Yes  Can we respond through MyChart? Yes  Agent: Please be advised that Rx refills may take up to 3 business days. We ask that you follow-up with your pharmacy.

## 2023-09-24 MED ORDER — OXYCODONE HCL 5 MG PO TABS: ORAL_TABLET | ORAL | 0 refills | Status: DC

## 2023-09-24 NOTE — Telephone Encounter (Signed)
 Reason for CRM: Patient called in regarding oxyCODONE (OXY IR/ROXICODONE) 5 MG immediate release tablet , stared that the pharmacy stated she doesn't have a refill on file, patient would like for a nurse to give her a callback regarding this issue   MyChart sent.

## 2023-10-15 ENCOUNTER — Ambulatory Visit: Payer: Medicare HMO | Admitting: Family Medicine

## 2023-10-24 ENCOUNTER — Other Ambulatory Visit: Payer: Self-pay | Admitting: Family Medicine

## 2023-10-26 NOTE — Telephone Encounter (Signed)
 Pt has upcoming appt on 4/15

## 2023-10-27 ENCOUNTER — Encounter: Payer: Self-pay | Admitting: Family Medicine

## 2023-10-27 ENCOUNTER — Ambulatory Visit (INDEPENDENT_AMBULATORY_CARE_PROVIDER_SITE_OTHER): Admitting: Family Medicine

## 2023-10-27 VITALS — BP 137/71 | HR 69 | Ht 62.0 in | Wt 188.4 lb

## 2023-10-27 DIAGNOSIS — G894 Chronic pain syndrome: Secondary | ICD-10-CM | POA: Diagnosis not present

## 2023-10-27 DIAGNOSIS — M25562 Pain in left knee: Secondary | ICD-10-CM | POA: Diagnosis not present

## 2023-10-27 DIAGNOSIS — N1831 Chronic kidney disease, stage 3a: Secondary | ICD-10-CM

## 2023-10-27 DIAGNOSIS — I10 Essential (primary) hypertension: Secondary | ICD-10-CM | POA: Diagnosis not present

## 2023-10-27 DIAGNOSIS — M170 Bilateral primary osteoarthritis of knee: Secondary | ICD-10-CM

## 2023-10-27 DIAGNOSIS — M25561 Pain in right knee: Secondary | ICD-10-CM | POA: Diagnosis not present

## 2023-10-27 DIAGNOSIS — G8929 Other chronic pain: Secondary | ICD-10-CM

## 2023-10-27 LAB — BASIC METABOLIC PANEL WITH GFR
BUN: 12 mg/dL (ref 6–23)
CO2: 27 meq/L (ref 19–32)
Calcium: 9.5 mg/dL (ref 8.4–10.5)
Chloride: 103 meq/L (ref 96–112)
Creatinine, Ser: 1.12 mg/dL (ref 0.40–1.20)
GFR: 51.48 mL/min — ABNORMAL LOW (ref >60.00)
Glucose, Bld: 101 mg/dL — ABNORMAL HIGH (ref 70–99)
Potassium: 4.7 meq/L (ref 3.5–5.1)
Sodium: 138 meq/L (ref 135–145)

## 2023-10-27 MED ORDER — PANTOPRAZOLE SODIUM 40 MG PO TBEC: 40.0000 mg | DELAYED_RELEASE_TABLET | Freq: Every day | ORAL | 3 refills | Status: AC

## 2023-10-27 MED ORDER — DULOXETINE HCL 60 MG PO CPEP: 60.0000 mg | ORAL_CAPSULE | Freq: Every day | ORAL | 3 refills | Status: AC

## 2023-10-27 MED ORDER — ATORVASTATIN CALCIUM 80 MG PO TABS: 80.0000 mg | ORAL_TABLET | Freq: Every day | ORAL | 3 refills | Status: AC

## 2023-10-27 MED ORDER — CYCLOBENZAPRINE HCL 10 MG PO TABS: ORAL_TABLET | ORAL | 5 refills | Status: AC

## 2023-10-27 MED ORDER — PHENTERMINE HCL 37.5 MG PO CAPS: 37.5000 mg | ORAL_CAPSULE | ORAL | 2 refills | Status: DC

## 2023-10-27 MED ORDER — AMLODIPINE BESYLATE 10 MG PO TABS: 10.0000 mg | ORAL_TABLET | Freq: Every day | ORAL | 3 refills | Status: AC

## 2023-10-27 MED ORDER — OXYCODONE HCL 5 MG PO TABS: ORAL_TABLET | ORAL | 0 refills | Status: DC

## 2023-10-27 NOTE — Progress Notes (Signed)
 OFFICE VISIT  10/27/2023  CC:  Chief Complaint  Patient presents with   Medical Management of Chronic Issues    Pt is fasting    Patient is a 68 y.o. female who presents for 13-month follow-up chronic pain syndrome, hypertension, and chronic renal insufficiency stage IIIa. A/P as of last visit: "#1 chronic pain syndrome.  Chronic bilateral knee osteoarthritic pain.  Fibromyalgia. All pretty stable from a pain medication standpoint.  Refilled oxycodone 5 mg, 1-2 every 6 hours as needed, #90.  Continue gabapentin 600 mg 3 times daily and Cymbalta 60 mg a day. Urine drug screen today. She is followed by Ortho for her knees and is getting viscosupplementation injections. Hard to tell if this is helping.  She thinks she will probably eventually proceed with bilateral knee replacements.   2.  Hypertension, well-controlled on amlodipine 10 mg a day and Toprol-XL 50 mg a day. Electrolytes and creatinine monitoring today.   3.  Chronic renal insufficiency stage IIIa, patient with solitary kidney. Monitor renal function today.  She has nephrology follow-up 08/05/2023. She avoids NSAIDs and makes effort to hydrate adequately.   4.  Class I obesity, BMI 34, weight management. She continues to struggle.  Weight is up 4 pounds over the last 2 months. She has not maintained a good diet over the last couple of months due to the holidays. She exercises a couple times a week despite her pain. She takes phentermine 37.5 mg daily.  She elects to stay on this medication right now."  INTERIM HX: Apryle is doing pretty well. She does have a couple of GYN procedures coming up in the next month.  Indication for chronic opioid: chronic lumbar and bilat knee osteoarthritis , spine arthritis+ fibromyalgia pain that I treat with pain meds in order to maximize functioning and quality of life.  Not NSAID candidate due to chronic renal insufficiency. She is also taking cymbalta 60mg  qd and gabapentin 600 mg nightly  usually.  Also Flexeril pretty regularly. PMP AWARE reviewed today: most recent rx for phentermine 37.5 mg was filled 10/08/2023, # 30, rx by me.  Most recent oxycodone prescription filled 09/24/2023, #90, prescription by me. No red flags.  Past Medical History:  Diagnosis Date   Bilateral carpal tunnel syndrome    pt to get bilat carpal tunnel release as of 09/2018 ortho eval   Bilateral primary osteoarthritis of knee    L tricompart, R medial compart   BPPV (benign paroxysmal positional vertigo)    Cataract    Chronic pain syndrome    Chronic renal insufficiency, stage III (moderate) (HCC)    GFR 50's   Fibromyalgia    GERD (gastroesophageal reflux disease)    Heart murmur    History of galactorrhea 02/05/2011   History of kidney cancer    2013   Hyperlipidemia, mixed    Hypertension    IFG (impaired fasting glucose) 2016   HbA1c 5.7-5.8.   Knee pain, bilateral 02/05/2011   Lumbar spondylosis    Overweight(278.02)    Seasonal allergic rhinitis    Solitary left kidney 2013   Aquired: right nephrectomy for renal cancer    Past Surgical History:  Procedure Laterality Date   CARPAL TUNNEL RELEASE Right 09/2018   CESAREAN SECTION  1983   COLONOSCOPY  04/2013   Normal 2014.  01/2022 normal. Recall 38yrs d/t FH CRCA   DEXA     GYN scheduled this at 05/12/19 f/u.   ROBOT ASSISTED LAPAROSCOPIC NEPHRECTOMY  07/12/2012  Procedure: ROBOTIC ASSISTED LAPAROSCOPIC NEPHRECTOMY;  Surgeon: Sebastian Ache, MD;  Location: [facility];  Service: Urology;  Laterality: Right;   TONSILLECTOMY     TUBAL LIGATION      Outpatient Medications Prior to Visit  Medication Sig Dispense Refill   acetaminophen (TYLENOL) 500 MG tablet Take 1,000 mg by mouth every 8 (eight) hours as needed.     albuterol (VENTOLIN HFA) 108 (90 Base) MCG/ACT inhaler INHALE 2 PUFFS INTO THE LUNGS EVERY 6 HOURS AS NEEDED FOR WHEEZE OR SHORTNESS OF BREATH 18 g 0   diphenhydramine-acetaminophen (TYLENOL PM) 25-500 MG TABS Take 1  tablet by mouth at bedtime as needed.     fluconazole (DIFLUCAN) 150 MG tablet Take 1 tablet (150 mg total) by mouth daily. 1 tab po x 1 day 1 tablet 0   gabapentin (NEURONTIN) 300 MG capsule TAKE 2 CAPSULES BY MOUTH 3 TIMES A DAY 540 capsule 1   metoprolol succinate (TOPROL XL) 50 MG 24 hr tablet Take 1 tablet (50 mg total) by mouth daily. Take with or immediately following a meal. 90 tablet 3   Multiple Vitamin (MULTIVITAMIN WITH MINERALS) TABS Take 1 tablet by mouth daily.     mupirocin ointment (BACTROBAN) 2 % Apply 1 Application topically 3 (three) times daily. 15 g 0   OVER THE COUNTER MEDICATION Take 2 tablets by mouth daily. Energize Caffeine Pills Fast Acting Energy Pill     promethazine (PHENERGAN) 12.5 MG tablet 1-2 tabs po q6h prn nausea 30 tablet 0   sennosides-docusate sodium (SENOKOT-S) 8.6-50 MG tablet Take 1 tablet by mouth 2 (two) times daily. While taking pain meds to prevent constipation 30 tablet 0   Wheat Dextrin (BENEFIBER PO) Take by mouth.     amLODipine (NORVASC) 10 MG tablet TAKE 1 TABLET BY MOUTH EVERY DAY 30 tablet 2   atorvastatin (LIPITOR) 80 MG tablet TAKE 1 TABLET BY MOUTH EVERY DAY 90 tablet 1   cyclobenzaprine (FLEXERIL) 10 MG tablet TAKE 1 TABLET BY MOUTH THREE TIMES A DAY AS NEEDED 90 tablet 5   DULoxetine (CYMBALTA) 60 MG capsule Take 1 capsule (60 mg total) by mouth daily. TAKE 1 CAPSULE BY MOUTH EVERY DAY 90 capsule 0   oxyCODONE (OXY IR/ROXICODONE) 5 MG immediate release tablet 1-2 tabs po q6h prn 90 tablet 0   pantoprazole (PROTONIX) 40 MG tablet Take 1 tablet (40 mg total) by mouth daily. 90 tablet 1   phentermine 37.5 MG capsule Take 1 capsule (37.5 mg total) by mouth every morning. 30 capsule 2   No facility-administered medications prior to visit.    Allergies  Allergen Reactions   Fenofibrate Rash   Codeine Nausea Only   Dilaudid [Hydromorphone Hcl] Nausea And Vomiting    Review of Systems As per HPI  PE:    10/27/2023   11:11 AM  07/17/2023    2:06 PM 04/30/2023    1:23 PM  Vitals with BMI  Height 5\' 2"     Weight 188 lbs 6 oz 188 lbs 6 oz 184 lbs 6 oz  BMI 34.45  33.72  Systolic 137 126 454  Diastolic 71 73 68  Pulse 69 74 77     Physical Exam  Gen: Alert, well appearing.  Patient is oriented to person, place, time, and situation. AFFECT: pleasant, lucid thought and speech. No further exam today  LABS:  Last CBC Lab Results  Component Value Date   WBC 8.9 07/09/2022   HGB 14.0 07/09/2022   HCT 40.5 07/09/2022  MCV 91.8 07/09/2022   MCH 31.7 07/09/2022   RDW 12.6 07/09/2022   PLT 310 07/09/2022   Last metabolic panel Lab Results  Component Value Date   GLUCOSE 103 (H) 07/17/2023   NA 138 07/17/2023   K 4.5 07/17/2023   CL 101 07/17/2023   CO2 27 07/17/2023   BUN 14 07/17/2023   CREATININE 1.33 (H) 07/17/2023   GFR 48.41 (L) 03/19/2022   CALCIUM 9.5 07/17/2023   PHOS 3.5 02/04/2012   PROT 6.3 12/23/2022   ALBUMIN 4.6 06/17/2021   BILITOT 0.5 12/23/2022   ALKPHOS 73 06/17/2021   AST 17 12/23/2022   ALT 16 12/23/2022   Last lipids Lab Results  Component Value Date   CHOL 177 12/23/2022   HDL 69 12/23/2022   LDLCALC 76 12/23/2022   LDLDIRECT 125.0 06/17/2021   TRIG 219 (H) 12/23/2022   CHOLHDL 2.6 12/23/2022   Last hemoglobin A1c Lab Results  Component Value Date   HGBA1C 5.8 (H) 12/23/2022   Last thyroid functions Lab Results  Component Value Date   TSH 0.93 06/17/2021   IMPRESSION AND PLAN:  1 chronic pain syndrome.  Chronic bilateral knee osteoarthritic pain.  Fibromyalgia. All pretty stable from a pain medication standpoint.  Refilled oxycodone 5 mg, 1-2 every 6 hours as needed, #90.  Continue gabapentin 600 mg 3 times daily and Cymbalta 60 mg a day. Urine drug screen today. She is followed by Ortho for her knees and is getting viscosupplementation injections. Hard to tell if this is helping.  She thinks she will probably eventually proceed with bilateral knee  replacements.   2.  Hypertension, well-controlled on amlodipine 10 mg a day and Toprol-XL 50 mg a day. Electrolytes and creatinine monitoring today.   3.  Chronic renal insufficiency stage IIIa, patient with solitary kidney. Monitor renal function today.   She avoids NSAIDs and makes effort to hydrate adequately. She is followed by nephrology and they have plans to do renal ultrasound monitoring soon.  4.  Class I obesity, BMI 34, weight management. She continues to struggle.  Weight is up 4 pounds over the last 2 months. She takes phentermine 37.5 mg daily.  She elects to stay on this medication right now.  CSC updated today. UDS UTD Jan this year.  An After Visit Summary was printed and given to the patient.  FOLLOW UP: Return in about 3 months (around 01/26/2024) for annual CPE (fasting).  Signed:  Arletha Lady, MD           10/27/2023

## 2023-11-05 ENCOUNTER — Encounter: Payer: Self-pay | Admitting: Family Medicine

## 2023-11-10 NOTE — H&P (Signed)
 [name], [employer] MEDICAL RECORD NO: [id number] ACCOUNT NO: [id number] DATE OF BIRTH: Jan 19, 1956 FACILITY: [facility] LOCATION: [facility] PHYSICIAN: Verdel Gitelman. Steve El, MD  History and Physical   DATE OF ADMISSION: 11/16/2023  CHIEF COMPLAINT: Postmenopausal bleeding.  HISTORY OF PRESENT ILLNESS: The patient is a 68 year old G5, P4 married female who presented to our office with complaints of some postmenopausal bleeding. She was seen by our nurse practitioner, initially did an ultrasound on 02/25 that showed some  thickening of the endometrium, possible polyp. On 09/23/2023, she underwent SHG in our office that demonstrated a well-defined polyp. She presents at this time for D and C hysteroscopy with MyoSure for polyp resection. This procedure including risks  related to bleeding, infection, adjacent organ injury, possible need for additional surgery discussed with her which she understands and accepts.  PAST MEDICAL HISTORY: Chronic pain, chronic renal insufficiency stage III, hypertension, fibromyalgia, history of a malignant neoplasm of the kidney, dyslipidemia, sleep apnea, overactive bladder and prediabetes.  ALLERGIES: PATIENT IS ALLERGIC TO CODEINE, DILAUDID , AND FENOFIBRATE .  CURRENT MEDICATIONS: Albuterol  inhaler p.r.n., amlodipine  10 mg daily, atorvastatin  80 mg daily, cyclobenzaprine  p.r.n., duloxetine  60 mg once daily, gabapentin  300 mg t.i.d. p.r.n., metoprolol  50 mg once daily, and pantoprazole  daily p.r.n.  OBSTETRICAL HISTORY: She is a G5, P4. She has had one C-section, 4 vaginal deliveries.  FAMILY HISTORY: Mother has a history of diabetes and hypertension. The patient has a family history of diabetes too with her sister and grandparents. Sister, aunt, and grandmother have all had breast cancer. Grandmother and father have had colon cancer  and daughter has asthma.  PAST SURGICAL HISTORY: C-section, prior D and C, prior tubal, and excision of right kidney on  07/13/2012.  PHYSICAL EXAMINATION: VITAL SIGNS: Temperature 98.2. Blood pressure 130/78. HEENT: Unremarkable. NECK: Supple without masses. LUNGS: Clear. CARDIOVASCULAR: Regular rate and rhythm without murmurs, rubs, or gallops noted. BREASTS: Without masses, tenderness, or nipple discharge. ABDOMEN: Soft, flat, and nontender. PELVIC: Vulva, vagina, and cervix are normal. Uterus upper limit of normal size. Mobile. Adnexa negative. EXTREMITIES: Neurologic exam unremarkable.  IMPRESSION: Postmenopausal bleeding, endometrial polyp noted on sonohysterogram.  PLAN: D and C hysteroscopy with MyoSure LITE. Procedure and risks discussed as above.    SUJ D: 11/02/2023 2:44:24 pm T: 11/03/2023 1:55:00 am  JOB: [id number]/ [id number]

## 2023-11-13 NOTE — Progress Notes (Signed)
 Instructions only  Anesthesia review: Y  Patient verbally denies any shortness of breath, fever, cough and chest pain during phone call   -------------  SDW INSTRUCTIONS given:  Your procedure is scheduled on Monday May 5th.  Report to [facility] Main Entrance "A" at 0530 A.M., and check in at the Admitting office.  Call this number if you have problems the morning of surgery:  [phone]   Remember:  Do not eat after midnight the night before your surgery  You may drink clear liquids until 0430 the morning of your surgery.   Clear liquids allowed are: Water , Non-Citrus Juices (without pulp), Carbonated Beverages, Clear Tea, Black Coffee Only, and Gatorade    Take these medicines the morning of surgery with A SIP OF WATER   amLODipine  (NORVASC )  DULoxetine  (CYMBALTA )  gabapentin  (NEURONTIN )  metoprolol  succinate (TOPROL  XL)  pantoprazole  (PROTONIX )  acetaminophen  (TYLENOL )-if needed Albuterol -if needed (Please bring with you on the day of surgery) cyclobenzaprine  (FLEXERIL )  oxyCODONE -if needed promethazine  (PHENERGAN )-if needed   As of today, STOP taking any Aspirin (unless otherwise instructed by your surgeon) Aleve, Naproxen, Ibuprofen, Motrin, Advil, Goody's, BC's, all herbal medications, fish oil, and all vitamins.                      Do not wear jewelry, make up, or nail polish            Do not wear lotions, powders, perfumes/colognes, or deodorant.            Do not shave 48 hours prior to surgery.  Men may shave face and neck.            Do not bring valuables to the hospital.            [facility] is not responsible for any belongings or valuables.  Do NOT Smoke (Tobacco/Vaping) 24 hours prior to your procedure If you use a CPAP at night, you may bring all equipment for your overnight stay.   Contacts, glasses, dentures or bridgework may not be worn into surgery.      For patients admitted to the hospital, discharge time will be determined by your treatment  team.   Patients discharged the day of surgery will not be allowed to drive home, and someone needs to stay with them for 24 hours.    Special instructions:   [facility]- Preparing For Surgery  Before surgery, you can play an important role. Because skin is not sterile, your skin needs to be as free of germs as possible. You can reduce the number of germs on your skin by washing with CHG (chlorahexidine gluconate) Soap before surgery.  CHG is an antiseptic cleaner which kills germs and bonds with the skin to continue killing germs even after washing.    Oral Hygiene is also important to reduce your risk of infection.  Remember - BRUSH YOUR TEETH THE MORNING OF SURGERY WITH YOUR REGULAR TOOTHPASTE  Please do not use if you have an allergy to CHG or antibacterial soaps. If your skin becomes reddened/irritated stop using the CHG.  Do not shave (including legs and underarms) for at least 48 hours prior to first CHG shower. It is OK to shave your face.  Please follow these instructions carefully.   Shower the NIGHT BEFORE SURGERY and the MORNING OF SURGERY with DIAL Soap.   Pat yourself dry with a CLEAN TOWEL.  Wear CLEAN PAJAMAS to bed the night before surgery  Place CLEAN SHEETS  on your bed the night of your first shower and DO NOT SLEEP WITH PETS.   Day of Surgery: Please shower morning of surgery  Wear Clean/Comfortable clothing the morning of surgery Do not apply any deodorants/lotions.   Remember to brush your teeth WITH YOUR REGULAR TOOTHPASTE.   Questions were answered. Patient verbalized understanding of instructions.

## 2023-11-15 NOTE — Anesthesia Preprocedure Evaluation (Signed)
 Anesthesia Evaluation  Patient identified by MRN, date of birth, ID band Patient awake    Reviewed: Allergy & Precautions, NPO status , Patient's Chart, lab work & pertinent test results  Airway        Dental   Pulmonary former smoker          Cardiovascular hypertension (amlodipine , metoprolol ), Pt. on medications and Pt. on home beta blockers + Valvular Problems/Murmurs   HLD   Neuro/Psych Chronic pain syndrome, BPPV  Neuromuscular disease (lumbar spondylosis)    GI/Hepatic ,GERD  Medicated,,  Endo/Other    Renal/GU CRFRenal disease (h/o renal cancer s/p nephrectomy)     Musculoskeletal  (+) Arthritis , Osteoarthritis,  Fibromyalgia -  Abdominal   Peds  Hematology Lab Results      Component                Value               Date                      WBC                      8.9                 07/09/2022                HGB                      14.0                07/09/2022                HCT                      40.5                07/09/2022                MCV                      91.8                07/09/2022                PLT                      310                 07/09/2022              Anesthesia Other Findings Last phentermine :  Reproductive/Obstetrics                             Anesthesia Physical Anesthesia Plan  ASA: 3  Anesthesia Plan: General   Post-op Pain Management: Tylenol  PO (pre-op)*   Induction: Intravenous  PONV Risk Score and Plan: 3 and Ondansetron , Dexamethasone and Treatment may vary due to age or medical condition  Airway Management Planned: LMA  Additional Equipment:   Intra-op Plan:   Post-operative Plan: Extubation in OR  Informed Consent:      Dental advisory given  Plan Discussed with: CRNA and Anesthesiologist  Anesthesia Plan Comments: (Risks of general anesthesia discussed including, but not limited to, sore throat, hoarse  voice, chipped/damaged teeth, injury to vocal cords, nausea and vomiting,  allergic reactions, lung infection, heart attack, stroke, and death. All questions answered. )       Anesthesia Quick Evaluation

## 2023-11-16 ENCOUNTER — Encounter (HOSPITAL_COMMUNITY): Payer: Self-pay | Admitting: Physician Assistant

## 2023-11-16 ENCOUNTER — Encounter (HOSPITAL_COMMUNITY)
Admission: RE | Disposition: A | Discharge status: Home / Self Care | Payer: Self-pay | Source: Home / Self Care | Attending: Obstetrics and Gynecology

## 2023-11-16 ENCOUNTER — Ambulatory Visit (HOSPITAL_COMMUNITY)
Admission: RE | Admit: 2023-11-16 | Discharge: 2023-11-16 | Disposition: A | Discharge status: Home / Self Care | Attending: Obstetrics and Gynecology | Admitting: Obstetrics and Gynecology

## 2023-11-16 DIAGNOSIS — N950 Postmenopausal bleeding: Secondary | ICD-10-CM

## 2023-11-16 SURGERY — DILATATION AND CURETTAGE /HYSTEROSCOPY
Anesthesia: Choice

## 2023-11-16 MED ORDER — CEFAZOLIN SODIUM 1 G IJ SOLR
INTRAMUSCULAR | Status: AC
  Filled 2023-11-16: qty 20

## 2023-11-16 MED ORDER — GLYCOPYRROLATE PF 0.2 MG/ML IJ SOSY
PREFILLED_SYRINGE | INTRAMUSCULAR | Status: AC
  Filled 2023-11-16: qty 1

## 2023-11-16 MED ORDER — FENTANYL CITRATE (PF) 250 MCG/5ML IJ SOLN
INTRAMUSCULAR | Status: AC
  Filled 2023-11-16: qty 5

## 2023-11-16 MED ORDER — ROCURONIUM BROMIDE 10 MG/ML (PF) SYRINGE
PREFILLED_SYRINGE | INTRAVENOUS | Status: AC
Start: 2023-11-16 — End: ?
  Filled 2023-11-16: qty 10

## 2023-11-16 MED ORDER — DEXAMETHASONE SODIUM PHOSPHATE 10 MG/ML IJ SOLN
INTRAMUSCULAR | Status: AC
  Filled 2023-11-16: qty 1

## 2023-11-16 MED ORDER — EPHEDRINE 5 MG/ML INJ
INTRAVENOUS | Status: AC
  Filled 2023-11-16: qty 5

## 2023-11-16 MED ORDER — MIDAZOLAM HCL 2 MG/2ML IJ SOLN
INTRAMUSCULAR | Status: AC
  Filled 2023-11-16: qty 2

## 2023-11-16 MED ORDER — LIDOCAINE 2% (20 MG/ML) 5 ML SYRINGE
INTRAMUSCULAR | Status: AC
  Filled 2023-11-16: qty 5

## 2023-11-16 MED ORDER — PHENYLEPHRINE 80 MCG/ML (10ML) SYRINGE FOR IV PUSH (FOR BLOOD PRESSURE SUPPORT)
PREFILLED_SYRINGE | INTRAVENOUS | Status: AC
Start: 2023-11-16 — End: ?
  Filled 2023-11-16: qty 10

## 2023-11-16 MED ORDER — ONDANSETRON HCL 4 MG/2ML IJ SOLN
INTRAMUSCULAR | Status: AC
  Filled 2023-11-16: qty 2

## 2023-11-16 MED ORDER — SUCCINYLCHOLINE CHLORIDE 200 MG/10ML IV SOSY
PREFILLED_SYRINGE | INTRAVENOUS | Status: AC
Start: 2023-11-16 — End: ?
  Filled 2023-11-16: qty 10

## 2023-12-07 ENCOUNTER — Encounter: Payer: Self-pay | Admitting: Family Medicine

## 2023-12-08 ENCOUNTER — Ambulatory Visit: Payer: Self-pay

## 2023-12-08 ENCOUNTER — Other Ambulatory Visit: Payer: Self-pay | Admitting: Family Medicine

## 2023-12-08 MED ORDER — OXYCODONE HCL 5 MG PO TABS: ORAL_TABLET | ORAL | 0 refills | Status: DC

## 2023-12-08 NOTE — Telephone Encounter (Signed)
 Copied from CRM [id number]. Topic: Clinical - Medication Refill >> Dec 08, 2023  8:13 AM [name] wrote: Medication: oxyCODONE  (OXY IR/ROXICODONE ) 5 MG immediate release tablet  Has the patient contacted their pharmacy? Yes (Agent: If no, request that the patient contact the pharmacy for the refill. If patient does not wish to contact the pharmacy document the reason why and proceed with request.) (Agent: If yes, when and what did the pharmacy advise?)  This is the patient's preferred pharmacy:  [id number] - [place], [place] - [address] [place] [address] Phone: [phone] Fax: [phone]  Is this the correct pharmacy for this prescription? Yes If no, delete pharmacy and type the correct one.   Has the prescription been filled recently? No  Is the patient out of the medication? Yes  Has the patient been seen for an appointment in the last year OR does the patient have an upcoming appointment? Yes  Can we respond through MyChart? Yes  Agent: Please be advised that Rx refills may take up to 3 business days. We ask that you follow-up with your pharmacy.

## 2023-12-08 NOTE — Telephone Encounter (Signed)
 Unable to reach patient after 3 attempts by [id number] NT, routing to the provider for resolution per protocol.

## 2023-12-08 NOTE — Telephone Encounter (Signed)
 This RN made a second attempt to contact the pt. Pt did not answer. RN LVM with a callback number.

## 2023-12-08 NOTE — Telephone Encounter (Signed)
Oxycodone prescription sent.

## 2023-12-08 NOTE — Telephone Encounter (Signed)
 Patient called, left VM to return the call to the office to speak to NT.   Copied from CRM [id number]. Topic: Clinical - Medical Advice >> Dec 08, 2023  8:15 AM [name] wrote: Reason for CRM: Patient would like to know if there is an alternative medication that can be prescribed for her current skin issue. Medication(mupirocin  ointment (BACTROBAN ) 2 %)  has not been effective.please contact patient at [phone] to advise.

## 2023-12-10 ENCOUNTER — Ambulatory Visit: Payer: Self-pay | Admitting: Family Medicine

## 2023-12-10 NOTE — Telephone Encounter (Signed)
 Chief Complaint: Headache on Tuesday and Wednesday (front of head, pressure, unable to focus, "pounding") now in back of neck constant ("not severe")  Symptoms: Sore throat ongoing issue, allergy symptom, earache and draining clear fluid Pertinent Negatives: Patient denies fever  Disposition:  [x] Appointment (In office)  Additional Notes: Patient states the headache went away after taking oxycodone . Patient scheduled for an appointment tomorrow in office. This RN educated pt on new-worsening symptoms and when to call back/seek emergent care. Pt verbalized understanding and agrees to plan.   Copied from CRM [id number]. Topic: Clinical - Red Word Triage >> Dec 10, 2023  9:39 AM Lovett Ruck C wrote: Red Word that prompted transfer to Nurse Triage: patient has been having extreme headaches to the point where she needs to lay down. Ears and throat have also been hurting. Reason for Disposition  [1] MILD-MODERATE headache AND [2] present > 72 hours  Answer Assessment - Initial Assessment Questions Chief Complaint: Headache on Tuesday and Wednesday (front of head, pressure, unable to focus, "pounding") now in back of neck constant ("not severe")  Symptoms: Sore throat ongoing issue, allergy symptom, earache and draining clear fluid  Pertinent Negatives: Patient denies fever  Protocols used: Headache-A-AH

## 2023-12-11 ENCOUNTER — Encounter: Payer: Self-pay | Admitting: Family Medicine

## 2023-12-11 ENCOUNTER — Ambulatory Visit (INDEPENDENT_AMBULATORY_CARE_PROVIDER_SITE_OTHER): Admitting: Family Medicine

## 2023-12-11 VITALS — BP 110/65 | HR 75 | Temp 97.9°F | Ht 62.0 in | Wt 189.4 lb

## 2023-12-11 DIAGNOSIS — R519 Headache, unspecified: Secondary | ICD-10-CM | POA: Diagnosis not present

## 2023-12-11 DIAGNOSIS — J069 Acute upper respiratory infection, unspecified: Secondary | ICD-10-CM | POA: Diagnosis not present

## 2023-12-11 MED ORDER — MUPIROCIN 2 % EX OINT: TOPICAL_OINTMENT | CUTANEOUS | 0 refills | Status: AC

## 2023-12-11 MED ORDER — PREDNISONE 20 MG PO TABS: ORAL_TABLET | ORAL | 0 refills | Status: DC

## 2023-12-11 NOTE — Progress Notes (Signed)
 OFFICE VISIT  12/11/2023  CC:  Chief Complaint  Patient presents with   Headache    X 3 days; pt thinks it may be cluster headache    Patient is a 68 y.o. female who presents for headache.  HPI: Onset 3 days ago: Sore throat, ears stopped up and hurting, glands in neck feel sore and swollen, some postnasal drip and cough are present.  Sinuses feel congested.  No fever. She feels a dull headache in the back of her head. No nausea, vomiting, diarrhea, shortness of breath, or chest pain.  Past Medical History:  Diagnosis Date   Bilateral carpal tunnel syndrome    pt to get bilat carpal tunnel release as of 09/2018 ortho eval   Bilateral primary osteoarthritis of knee    L tricompart, R medial compart   BPPV (benign paroxysmal positional vertigo)    Cataract    Chronic pain syndrome    Chronic renal insufficiency, stage III (moderate) (HCC)    GFR 50's   Fibromyalgia    GERD (gastroesophageal reflux disease)    Heart murmur    History of galactorrhea 02/05/2011   History of kidney cancer    2013   Hyperlipidemia, mixed    Hypertension    IFG (impaired fasting glucose) 2016   HbA1c 5.7-5.8.   Knee pain, bilateral 02/05/2011   Lumbar spondylosis    Overweight(278.02)    Seasonal allergic rhinitis    Solitary left kidney 2013   Aquired: right nephrectomy for renal cancer    Past Surgical History:  Procedure Laterality Date   CARPAL TUNNEL RELEASE Right 09/2018   CESAREAN SECTION  1983   COLONOSCOPY  04/2013   Normal 2014.  01/2022 normal. Recall 61yrs d/t FH CRCA   DEXA     GYN scheduled this at 05/12/19 f/u.   ROBOT ASSISTED LAPAROSCOPIC NEPHRECTOMY  07/12/2012   Procedure: ROBOTIC ASSISTED LAPAROSCOPIC NEPHRECTOMY;  Surgeon: Osborn Blaze, MD;  Location: [facility];  Service: Urology;  Laterality: Right;   TONSILLECTOMY     TUBAL LIGATION      Outpatient Medications Prior to Visit  Medication Sig Dispense Refill   acetaminophen  (TYLENOL ) 500 MG tablet Take 1,000 mg  by mouth every 8 (eight) hours as needed.     amLODipine  (NORVASC ) 10 MG tablet Take 1 tablet (10 mg total) by mouth daily. 90 tablet 3   atorvastatin  (LIPITOR) 80 MG tablet Take 1 tablet (80 mg total) by mouth daily. 90 tablet 3   cyclobenzaprine  (FLEXERIL ) 10 MG tablet TAKE 1 TABLET BY MOUTH THREE TIMES A DAY AS NEEDED 90 tablet 5   diphenhydramine-acetaminophen  (TYLENOL  PM) 25-500 MG TABS Take 1 tablet by mouth at bedtime as needed.     DULoxetine  (CYMBALTA ) 60 MG capsule Take 1 capsule (60 mg total) by mouth daily. TAKE 1 CAPSULE BY MOUTH EVERY DAY 90 capsule 3   fluconazole  (DIFLUCAN ) 150 MG tablet Take 1 tablet (150 mg total) by mouth daily. 1 tab po x 1 day 1 tablet 0   gabapentin  (NEURONTIN ) 300 MG capsule TAKE 2 CAPSULES BY MOUTH 3 TIMES A DAY 540 capsule 1   metoprolol  succinate (TOPROL  XL) 50 MG 24 hr tablet Take 1 tablet (50 mg total) by mouth daily. Take with or immediately following a meal. 90 tablet 3   Multiple Vitamin (MULTIVITAMIN WITH MINERALS) TABS Take 1 tablet by mouth daily.     OVER THE COUNTER MEDICATION Take 2 tablets by mouth daily. Energize Caffeine Pills Fast Acting Energy Pill  oxyCODONE  (OXY IR/ROXICODONE ) 5 MG immediate release tablet 1-2 tabs po q6h prn 90 tablet 0   pantoprazole  (PROTONIX ) 40 MG tablet Take 1 tablet (40 mg total) by mouth daily. 90 tablet 3   phentermine  37.5 MG capsule Take 1 capsule (37.5 mg total) by mouth every morning. 30 capsule 2   promethazine  (PHENERGAN ) 12.5 MG tablet 1-2 tabs po q6h prn nausea 30 tablet 0   sennosides-docusate sodium  (SENOKOT-S) 8.6-50 MG tablet Take 1 tablet by mouth 2 (two) times daily. While taking pain meds to prevent constipation 30 tablet 0   Wheat Dextrin (BENEFIBER PO) Take by mouth.     mupirocin  ointment (BACTROBAN ) 2 % Apply 1 Application topically 3 (three) times daily. 15 g 0   albuterol  (VENTOLIN  HFA) 108 (90 Base) MCG/ACT inhaler INHALE 2 PUFFS INTO THE LUNGS EVERY 6 HOURS AS NEEDED FOR WHEEZE OR  SHORTNESS OF BREATH (Patient not taking: Reported on 12/11/2023) 18 g 0   No facility-administered medications prior to visit.    Allergies  Allergen Reactions   Fenofibrate  Rash   Codeine Nausea Only   Dilaudid  [Hydromorphone  Hcl] Nausea And Vomiting    Review of Systems  As per HPI  PE:    12/11/2023    1:06 PM 10/27/2023   11:11 AM 07/17/2023    2:06 PM  Vitals with BMI  Height 5\' 2"  5\' 2"    Weight 189 lbs 6 oz 188 lbs 6 oz 188 lbs 6 oz  BMI 34.63 34.45   Systolic 110 137 161  Diastolic 65 71 73  Pulse 75 69 74     Physical Exam  VS: noted--normal. Gen: alert, NAD, NONTOXIC APPEARING. HEENT: eyes without injection, drainage, or swelling.  Ears: EACs clear, TMs with normal light reflex and landmarks.  Nose: Minimal rhinorrhea--clear, with some dried, crusty exudate adherent to mildly injected mucosa.  No purulent d/c.  No paranasal sinus TTP.  No facial swelling.  Throat and mouth without focal lesion.  No pharyngial swelling, erythema, or exudate.   Neck: supple, no LAD.   LUNGS: CTA bilat, nonlabored resps.   CV: RRR, no m/r/g. EXT: no c/c/e SKIN: no rash  LABS:  Last CBC Lab Results  Component Value Date   WBC 8.9 07/09/2022   HGB 14.0 07/09/2022   HCT 40.5 07/09/2022   MCV 91.8 07/09/2022   MCH 31.7 07/09/2022   RDW 12.6 07/09/2022   PLT 310 07/09/2022   Last metabolic panel Lab Results  Component Value Date   GLUCOSE 101 (H) 10/27/2023   NA 138 10/27/2023   K 4.7 10/27/2023   CL 103 10/27/2023   CO2 27 10/27/2023   BUN 12 10/27/2023   CREATININE 1.12 10/27/2023   GFR 51.48 (L) 10/27/2023   CALCIUM  9.5 10/27/2023   PHOS 3.5 02/04/2012   PROT 6.3 12/23/2022   ALBUMIN 4.6 06/17/2021   BILITOT 0.5 12/23/2022   ALKPHOS 73 06/17/2021   AST 17 12/23/2022   ALT 16 12/23/2022   Last hemoglobin A1c Lab Results  Component Value Date   HGBA1C 5.8 (H) 12/23/2022   IMPRESSION AND PLAN:  URI with cough/congestion.  Viral etiology suspected. This is  superimposed on significant seasonal allergic rhinitis. Will do prednisone  burst: 40 mg a day x 5 days. She will continue daily antihistamine, Tylenol  1000 mg 3 times daily as needed.  An After Visit Summary was printed and given to the patient.  FOLLOW UP: Return if symptoms worsen or fail to improve.  Signed:  Phil Hiroshi Krummel,  MD           12/11/2023

## 2023-12-23 ENCOUNTER — Other Ambulatory Visit: Payer: Self-pay | Admitting: Family Medicine

## 2023-12-23 NOTE — Telephone Encounter (Signed)
 Copied from CRM [id number]. Topic: Clinical - Medication Refill >> Dec 23, 2023  5:13 PM Leah C wrote: Medication: phentermine  37.5 MG capsule Patient is currently out of town and is asking if [facility] as she is out of her medication.   Has the patient contacted their pharmacy? No (Agent: If no, request that the patient contact the pharmacy for the refill. If patient does not wish to contact the pharmacy document the reason why and proceed with request.) (Agent: If yes, when and what did the pharmacy advise?)  This is the patient's preferred pharmacy:  [id number] [place], [place] - [address] [facility] [address] [facility] [address] [place] [address] Phone: [phone] Fax: [phone] Hours: Not open 24 hours    Is this the correct pharmacy for this prescription? Yes If no, delete pharmacy and type the correct one.   Has the prescription been filled recently? Yes  Is the patient out of the medication? Yes  Has the patient been seen for an appointment in the last year OR does the patient have an upcoming appointment? Yes  Can we respond through MyChart? Yes  Agent: Please be advised that Rx refills may take up to 3 business days. We ask that you follow-up with your pharmacy.

## 2023-12-24 MED ORDER — PHENTERMINE HCL 37.5 MG PO CAPS: 37.5000 mg | ORAL_CAPSULE | ORAL | 5 refills | Status: DC

## 2023-12-24 NOTE — Telephone Encounter (Signed)
 RF request for Phentermine  LOV: 12/11/23 Next ov: 01/26/24 Last written: 10/27/23 (30,2)

## 2024-01-11 ENCOUNTER — Other Ambulatory Visit: Payer: Self-pay | Admitting: Family Medicine

## 2024-01-11 NOTE — Telephone Encounter (Unsigned)
 Copied from CRM [id number]. Topic: Clinical - Medication Refill >> Jan 11, 2024 11:48 AM Ernestene P wrote: Medication: oxyCODONE  (OXY IR/ROXICODONE ) 5 MG immediate release tablet  Has the patient contacted their pharmacy? Yes (Agent: If no, request that the patient contact the pharmacy for the refill. If patient does not wish to contact the pharmacy document the reason why and proceed with request.) (Agent: If yes, when and what did the pharmacy advise?)  This is the patient's preferred pharmacy:  [id number] [place], [place] - [address] [place] [address] Phone: [phone] Fax: [phone]   Is this the correct pharmacy for this prescription? Yes If no, delete pharmacy and type the correct one.   Has the prescription been filled recently? No  Is the patient out of the medication? Yes  Has the patient been seen for an appointment in the last year OR does the patient have an upcoming appointment? Yes  Can we respond through MyChart? Yes  Agent: Please be advised that Rx refills may take up to 3 business days. We ask that you follow-up with your pharmacy.

## 2024-01-12 MED ORDER — OXYCODONE HCL 5 MG PO TABS: ORAL_TABLET | ORAL | 0 refills | Status: DC

## 2024-01-12 NOTE — Telephone Encounter (Signed)
 Requesting: oxycodone  Contract: 10/27/23 UDS: 07/17/23 Last Visit: 12/11/23 Next Visit: 01/26/24 Last Refill: 12/08/23 (90,0)  Please Advise. Rx pending

## 2024-01-18 ENCOUNTER — Encounter (HOSPITAL_COMMUNITY): Payer: Self-pay | Admitting: Obstetrics and Gynecology

## 2024-01-18 NOTE — H&P (Signed)
 [name], [name] MEDICAL RECORD NO: [id number] ACCOUNT NO: [id number] DATE OF BIRTH: July 08, 1956 PHYSICIAN: Charlie HERO. Johnnye, MD  History and Physical   DATE OF ADMISSION: 01/25/2024  Date of surgery at [facility] is 07/14.  CHIEF COMPLAINT:  Postmenopausal bleeding.  HISTORY OF PRESENT ILLNESS:  The patient is a 68 year old married, G5, P4 who was evaluated recently for postmenopausal bleeding, SHT in our office showed a well-defined polyp, presents now for D and C, hysteroscopy with MyoSure LITE.  This procedure  including specific risks regarding bleeding, infection, and other complications, she may require additional surgery, was reviewed with her, which she understands and accepts.  PAST MEDICAL HISTORY: ALLERGIES:  CODEINE, DILAUDID , AND FENOFIBRATE .  CURRENT MEDICATIONS:  Albuterol  p.r.n., amlodipine  10 mg daily, atorvastatin  80 mg daily, cyclobenzaprine  p.r.n., Duloxetine  60 mg once daily, gabapentin  300 mg p.r.n., metoprolol  ER 50 mg daily, and pantoprazole  once daily.  PROBLEM LIST:  Include some renal insufficiency, chronic pain, hypertension, fibromyalgia, dyslipidemia, osteopenia, OAB, and prediabetes.  OBSTETRICAL HISTORY:  She has had 3 vaginal deliveries and 1 cesarean section.  FAMILY HISTORY:  Significant for mother with diabetes and hypertension.  Sister also has diabetes.  Family history of breast cancer with a sister, aunt, and grandmother.  Mother also had CHF and epilepsy.  SOCIAL HISTORY:  The patient is a former smoker.  She denies alcohol or drug use.  PAST SURGICAL HISTORY:  Cesarean section, tubal ligation, and excision of right kidney on 12/13 for malignant renal disease.  PHYSICAL EXAMINATION: VITAL SIGNS:  Temperature 98.3, blood pressure 116/60, and BMI 34.5. HEENT:  Unremarkable. NECK:  Supple without masses. LUNGS:  Clear. CARDIOVASCULAR:  Regular rate and rhythm without murmurs, rubs, or gallops noted. BREASTS:  Without masses. ABDOMEN:   Soft, flat, and nontender. PELVIC:  Vulva, vagina, and cervix normal.  Uterus in mid position and normal size.  Adnexa negative. EXTREMITIES:  Unremarkable. NEUROLOGIC:  Unremarkable.  IMPRESSION:  Postmenopausal bleeding and endometrial polyp.  PLAN:  D and C, hysteroscopy with MyoSure, excision of polyp.  Procedure and risks reviewed as above.   PUS D: 01/18/2024 9:16:00 am T: 01/18/2024 1:59:00 pm  JOB: [id number]/ [id number]

## 2024-01-19 ENCOUNTER — Encounter (HOSPITAL_COMMUNITY): Payer: Self-pay | Admitting: Obstetrics and Gynecology

## 2024-01-19 NOTE — Progress Notes (Signed)
 Spoke w/ via phone for pre-op interview--- [name] needs dos----  CBC per surgeon. EKG and BMP per anesthesia.       Lab results------ COVID test -----patient states asymptomatic no test needed Arrive at -------0530 NPO after MN NO Solid Food.   Pre-Surgery Ensure or G2:  Med rec completed Medications to take morning of surgery ----- -Norvasc , Cymbalta , Protonix , Metoprolol , Gabapentin , and oxycodone  PRN   GLP1 agonist last dose: GLP1 instructions:  Patient instructed no nail polish to be worn day of surgery Patient instructed to bring photo id and insurance card day of surgery Patient aware to have Driver (ride ) / caregiver    for 24 hours after surgery - Husband [name] Patient Special Instructions -----Shower with antibacterial soap. Hold Phentermine  24 hrs prior to procedure. Pre-Op special Instructions ----- NS IV fluids pt has kidney disease.  Patient verbalized understanding of instructions that were given at this phone interview. Patient denies chest pain, sob, fever, cough at the interview.

## 2024-01-20 DIAGNOSIS — Z1231 Encounter for screening mammogram for malignant neoplasm of breast: Secondary | ICD-10-CM | POA: Diagnosis not present

## 2024-01-20 DIAGNOSIS — Z01419 Encounter for gynecological examination (general) (routine) without abnormal findings: Secondary | ICD-10-CM | POA: Diagnosis not present

## 2024-01-20 DIAGNOSIS — Z6835 Body mass index (BMI) 35.0-35.9, adult: Secondary | ICD-10-CM | POA: Diagnosis not present

## 2024-01-24 NOTE — Anesthesia Preprocedure Evaluation (Signed)
 Anesthesia Evaluation  Patient identified by MRN, date of birth, ID band Patient awake    Reviewed: Allergy & Precautions, NPO status , Patient's Chart, lab work & pertinent test results  History of Anesthesia Complications Negative for: history of anesthetic complications  Airway Mallampati: III  TM Distance: >3 FB     Dental   Partial dentures, bridge:   Pulmonary neg shortness of breath, neg sleep apnea, neg COPD, neg recent URI, former smoker   Pulmonary exam normal breath sounds clear to auscultation       Cardiovascular hypertension (amlodipine , metoprolol ), Pt. on medications and Pt. on home beta blockers (-) angina (-) Past MI, (-) Cardiac Stents and (-) CABG (-) dysrhythmias + Valvular Problems/Murmurs  Rhythm:Regular Rate:Normal  HLD   Neuro/Psych  Headaches, neg Seizures Chronic pain syndrome, BPPV  Neuromuscular disease (lumbar spondylosis)    GI/Hepatic Neg liver ROS,GERD  Medicated,,  Endo/Other  Pre-diabetes  Renal/GU CRFRenal disease (h/o renal cancer s/p nephrectomy)     Musculoskeletal  (+) Arthritis , Osteoarthritis,  Fibromyalgia -  Abdominal  (+) + obese  Peds  Hematology negative hematology ROS (+)   Anesthesia Other Findings Last phentermine : 01/23/2024  Reproductive/Obstetrics                              Anesthesia Physical Anesthesia Plan  ASA: 3  Anesthesia Plan: General   Post-op Pain Management: Tylenol  PO (pre-op)*   Induction: Intravenous  PONV Risk Score and Plan: 3 and Ondansetron , Dexamethasone , Midazolam  and Treatment may vary due to age or medical condition  Airway Management Planned: LMA  Additional Equipment:   Intra-op Plan:   Post-operative Plan: Extubation in OR  Informed Consent: I have reviewed the patients History and Physical, chart, labs and discussed the procedure including the risks, benefits and alternatives for the proposed  anesthesia with the patient or authorized representative who has indicated his/her understanding and acceptance.     Dental advisory given  Plan Discussed with: CRNA and Anesthesiologist  Anesthesia Plan Comments: (Risks of general anesthesia discussed including, but not limited to, sore throat, hoarse voice, chipped/damaged teeth, injury to vocal cords, nausea and vomiting, allergic reactions, lung infection, heart attack, stroke, and death. All questions answered. )         Anesthesia Quick Evaluation

## 2024-01-25 ENCOUNTER — Other Ambulatory Visit: Payer: Self-pay

## 2024-01-25 ENCOUNTER — Ambulatory Visit (HOSPITAL_BASED_OUTPATIENT_CLINIC_OR_DEPARTMENT_OTHER): Payer: Self-pay | Admitting: Anesthesiology

## 2024-01-25 ENCOUNTER — Ambulatory Visit (HOSPITAL_COMMUNITY)
Admission: RE | Admit: 2024-01-25 | Discharge: 2024-01-25 | Disposition: A | Discharge status: Home / Self Care | Attending: Obstetrics and Gynecology | Admitting: Obstetrics and Gynecology

## 2024-01-25 ENCOUNTER — Ambulatory Visit (HOSPITAL_COMMUNITY): Payer: Self-pay | Admitting: Anesthesiology

## 2024-01-25 ENCOUNTER — Encounter (HOSPITAL_COMMUNITY)
Admission: RE | Disposition: A | Discharge status: Home / Self Care | Payer: Self-pay | Source: Home / Self Care | Attending: Obstetrics and Gynecology

## 2024-01-25 ENCOUNTER — Encounter (HOSPITAL_COMMUNITY): Payer: Self-pay | Admitting: Obstetrics and Gynecology

## 2024-01-25 DIAGNOSIS — I129 Hypertensive chronic kidney disease with stage 1 through stage 4 chronic kidney disease, or unspecified chronic kidney disease: Secondary | ICD-10-CM

## 2024-01-25 DIAGNOSIS — Z833 Family history of diabetes mellitus: Secondary | ICD-10-CM | POA: Diagnosis not present

## 2024-01-25 DIAGNOSIS — N840 Polyp of corpus uteri: Secondary | ICD-10-CM | POA: Diagnosis not present

## 2024-01-25 DIAGNOSIS — M1990 Unspecified osteoarthritis, unspecified site: Secondary | ICD-10-CM | POA: Insufficient documentation

## 2024-01-25 DIAGNOSIS — N189 Chronic kidney disease, unspecified: Secondary | ICD-10-CM | POA: Diagnosis not present

## 2024-01-25 DIAGNOSIS — G894 Chronic pain syndrome: Secondary | ICD-10-CM | POA: Insufficient documentation

## 2024-01-25 DIAGNOSIS — Z87891 Personal history of nicotine dependence: Secondary | ICD-10-CM | POA: Diagnosis not present

## 2024-01-25 DIAGNOSIS — K219 Gastro-esophageal reflux disease without esophagitis: Secondary | ICD-10-CM | POA: Diagnosis not present

## 2024-01-25 DIAGNOSIS — M479 Spondylosis, unspecified: Secondary | ICD-10-CM | POA: Insufficient documentation

## 2024-01-25 DIAGNOSIS — N183 Chronic kidney disease, stage 3 unspecified: Secondary | ICD-10-CM

## 2024-01-25 DIAGNOSIS — Z79899 Other long term (current) drug therapy: Secondary | ICD-10-CM | POA: Insufficient documentation

## 2024-01-25 DIAGNOSIS — E785 Hyperlipidemia, unspecified: Secondary | ICD-10-CM | POA: Insufficient documentation

## 2024-01-25 DIAGNOSIS — Z8249 Family history of ischemic heart disease and other diseases of the circulatory system: Secondary | ICD-10-CM | POA: Insufficient documentation

## 2024-01-25 DIAGNOSIS — R7303 Prediabetes: Secondary | ICD-10-CM | POA: Insufficient documentation

## 2024-01-25 DIAGNOSIS — N950 Postmenopausal bleeding: Secondary | ICD-10-CM | POA: Insufficient documentation

## 2024-01-25 DIAGNOSIS — Z905 Acquired absence of kidney: Secondary | ICD-10-CM | POA: Insufficient documentation

## 2024-01-25 DIAGNOSIS — M797 Fibromyalgia: Secondary | ICD-10-CM | POA: Insufficient documentation

## 2024-01-25 DIAGNOSIS — N939 Abnormal uterine and vaginal bleeding, unspecified: Secondary | ICD-10-CM

## 2024-01-25 DIAGNOSIS — Z85528 Personal history of other malignant neoplasm of kidney: Secondary | ICD-10-CM | POA: Diagnosis not present

## 2024-01-25 DIAGNOSIS — I10 Essential (primary) hypertension: Secondary | ICD-10-CM

## 2024-01-25 HISTORY — PX: DILATATION & CURRETTAGE/HYSTEROSCOPY WITH RESECTOCOPE: SHX5572

## 2024-01-25 HISTORY — DX: Prediabetes: R73.03

## 2024-01-25 LAB — BASIC METABOLIC PANEL WITH GFR
Anion gap: 10 (ref 5–15)
BUN: 17 mg/dL (ref 8–23)
CO2: 23 mmol/L (ref 22–32)
Calcium: 8.8 mg/dL — ABNORMAL LOW (ref 8.9–10.3)
Chloride: 102 mmol/L (ref 98–111)
Creatinine, Ser: 1.16 mg/dL — ABNORMAL HIGH (ref 0.44–1.00)
GFR, Estimated: 52 mL/min — ABNORMAL LOW (ref >60)
Glucose, Bld: 102 mg/dL — ABNORMAL HIGH (ref 70–99)
Potassium: 4.3 mmol/L (ref 3.5–5.1)
Sodium: 135 mmol/L (ref 135–145)

## 2024-01-25 LAB — CBC
HCT: 37.1 % (ref 36.0–46.0)
Hemoglobin: 12.3 g/dL (ref 12.0–15.0)
MCH: 31.4 pg (ref 26.0–34.0)
MCHC: 33.2 g/dL (ref 30.0–36.0)
MCV: 94.6 fL (ref 80.0–100.0)
Platelets: 295 K/uL (ref 150–400)
RBC: 3.92 MIL/uL (ref 3.87–5.11)
RDW: 13.7 % (ref 11.5–15.5)
WBC: 7.6 K/uL (ref 4.0–10.5)
nRBC: 0 % (ref 0.0–0.2)

## 2024-01-25 SURGERY — DILATATION & CURETTAGE/HYSTEROSCOPY WITH RESECTOCOPE
Anesthesia: General

## 2024-01-25 MED ORDER — PROPOFOL 10 MG/ML IV BOLUS
INTRAVENOUS | Status: DC | PRN
  Administered 2024-01-25: 160 mg via INTRAVENOUS

## 2024-01-25 MED ORDER — SODIUM CHLORIDE 0.9 % IV SOLN: INTRAVENOUS | Status: DC

## 2024-01-25 MED ORDER — LIDOCAINE 2% (20 MG/ML) 5 ML SYRINGE
INTRAMUSCULAR | Status: AC
  Filled 2024-01-25: qty 5

## 2024-01-25 MED ORDER — LIDOCAINE HCL 1 % IJ SOLN
INTRAMUSCULAR | Status: DC | PRN
  Administered 2024-01-25: 9 mL

## 2024-01-25 MED ORDER — AMISULPRIDE (ANTIEMETIC) 5 MG/2ML IV SOLN: 10.0000 mg | Freq: Once | INTRAVENOUS | Status: DC | PRN

## 2024-01-25 MED ORDER — ACETAMINOPHEN 500 MG PO TABS
ORAL_TABLET | ORAL | Status: AC
  Filled 2024-01-25: qty 2

## 2024-01-25 MED ORDER — CHLORHEXIDINE GLUCONATE 0.12 % MT SOLN
15.0000 mL | Freq: Once | OROMUCOSAL | Status: AC
  Administered 2024-01-25: 15 mL via OROMUCOSAL

## 2024-01-25 MED ORDER — ONDANSETRON HCL 4 MG/2ML IJ SOLN
INTRAMUSCULAR | Status: DC | PRN
  Administered 2024-01-25: 4 mg via INTRAVENOUS

## 2024-01-25 MED ORDER — DEXAMETHASONE SODIUM PHOSPHATE 10 MG/ML IJ SOLN
INTRAMUSCULAR | Status: DC | PRN
  Administered 2024-01-25: 5 mg via INTRAVENOUS

## 2024-01-25 MED ORDER — EPHEDRINE 5 MG/ML INJ
INTRAVENOUS | Status: AC
  Filled 2024-01-25: qty 5

## 2024-01-25 MED ORDER — MIDAZOLAM HCL 2 MG/2ML IJ SOLN
INTRAMUSCULAR | Status: AC
  Filled 2024-01-25: qty 2

## 2024-01-25 MED ORDER — SUCCINYLCHOLINE CHLORIDE 200 MG/10ML IV SOSY
PREFILLED_SYRINGE | INTRAVENOUS | Status: AC
  Filled 2024-01-25: qty 10

## 2024-01-25 MED ORDER — LACTATED RINGERS IV SOLN: INTRAVENOUS | Status: DC

## 2024-01-25 MED ORDER — CHLORHEXIDINE GLUCONATE 0.12 % MT SOLN
OROMUCOSAL | Status: AC
  Filled 2024-01-25: qty 15

## 2024-01-25 MED ORDER — POVIDONE-IODINE 10 % EX SWAB: 2.0000 | Freq: Once | CUTANEOUS | Status: DC

## 2024-01-25 MED ORDER — PHENYLEPHRINE HCL-NACL 20-0.9 MG/250ML-% IV SOLN
INTRAVENOUS | Status: DC | PRN
  Administered 2024-01-25: 80 ug via INTRAVENOUS
  Administered 2024-01-25: 40 ug via INTRAVENOUS

## 2024-01-25 MED ORDER — FENTANYL CITRATE (PF) 250 MCG/5ML IJ SOLN
INTRAMUSCULAR | Status: AC
Start: 2024-01-25 — End: 2024-01-25
  Filled 2024-01-25: qty 5

## 2024-01-25 MED ORDER — PROPOFOL 10 MG/ML IV BOLUS
INTRAVENOUS | Status: AC
  Filled 2024-01-25: qty 20

## 2024-01-25 MED ORDER — FENTANYL CITRATE (PF) 250 MCG/5ML IJ SOLN
INTRAMUSCULAR | Status: DC | PRN
  Administered 2024-01-25: 50 ug via INTRAVENOUS

## 2024-01-25 MED ORDER — SODIUM CHLORIDE 0.9 % IR SOLN
Status: DC | PRN
  Administered 2024-01-25: 3000 mL

## 2024-01-25 MED ORDER — MIDAZOLAM HCL 2 MG/2ML IJ SOLN
INTRAMUSCULAR | Status: DC | PRN
  Administered 2024-01-25: 2 mg via INTRAVENOUS

## 2024-01-25 MED ORDER — FENTANYL CITRATE (PF) 100 MCG/2ML IJ SOLN: 25.0000 ug | INTRAMUSCULAR | Status: DC | PRN

## 2024-01-25 MED ORDER — LIDOCAINE 2% (20 MG/ML) 5 ML SYRINGE
INTRAMUSCULAR | Status: DC | PRN
  Administered 2024-01-25: 100 mg via INTRAVENOUS

## 2024-01-25 MED ORDER — OXYCODONE HCL 5 MG/5ML PO SOLN: 5.0000 mg | Freq: Once | ORAL | Status: DC | PRN

## 2024-01-25 MED ORDER — DEXAMETHASONE SODIUM PHOSPHATE 10 MG/ML IJ SOLN
INTRAMUSCULAR | Status: AC
  Filled 2024-01-25: qty 1

## 2024-01-25 MED ORDER — OXYCODONE HCL 5 MG PO TABS: 5.0000 mg | ORAL_TABLET | Freq: Once | ORAL | Status: DC | PRN

## 2024-01-25 MED ORDER — ACETAMINOPHEN 500 MG PO TABS
1000.0000 mg | ORAL_TABLET | Freq: Once | ORAL | Status: AC
  Administered 2024-01-25: 1000 mg via ORAL

## 2024-01-25 MED ORDER — PHENYLEPHRINE 80 MCG/ML (10ML) SYRINGE FOR IV PUSH (FOR BLOOD PRESSURE SUPPORT)
PREFILLED_SYRINGE | INTRAVENOUS | Status: AC
  Filled 2024-01-25: qty 10

## 2024-01-25 MED ORDER — ORAL CARE MOUTH RINSE: 15.0000 mL | Freq: Once | OROMUCOSAL | Status: AC

## 2024-01-25 MED ORDER — ONDANSETRON HCL 4 MG/2ML IJ SOLN
INTRAMUSCULAR | Status: AC
  Filled 2024-01-25: qty 2

## 2024-01-25 MED ORDER — LIDOCAINE HCL (PF) 1 % IJ SOLN
INTRAMUSCULAR | Status: AC
  Filled 2024-01-25: qty 30

## 2024-01-25 SURGICAL SUPPLY — 14 items
CATH ROBINSON RED A/P 16FR (CATHETERS) ×2 IMPLANT
COVER MAYO STAND STRL (DRAPES) ×2 IMPLANT
DEVICE MYOSURE LITE (MISCELLANEOUS) IMPLANT
DEVICE MYOSURE REACH (MISCELLANEOUS) IMPLANT
GLOVE BIO SURGEON STRL SZ7 (GLOVE) ×2 IMPLANT
GLOVE SURG UNDER POLY LF SZ7 (GLOVE) ×2 IMPLANT
GOWN STRL REUS W/ TWL LRG LVL3 (GOWN DISPOSABLE) ×4 IMPLANT
KIT PROCEDURE FLUENT (KITS) ×2 IMPLANT
KIT TURNOVER KIT B (KITS) ×2 IMPLANT
PACK VAGINAL MINOR WOMEN LF (CUSTOM PROCEDURE TRAY) ×2 IMPLANT
PAD OB MATERNITY 11 LF (PERSONAL CARE ITEMS) ×2 IMPLANT
SEAL ROD LENS SCOPE MYOSURE (ABLATOR) ×2 IMPLANT
TOWEL GREEN STERILE FF (TOWEL DISPOSABLE) ×4 IMPLANT
UNDERPAD 30X36 HEAVY ABSORB (UNDERPADS AND DIAPERS) ×2 IMPLANT

## 2024-01-25 NOTE — Discharge Instructions (Signed)
  Post Anesthesia Home Care Instructions  Activity: Get plenty of rest for the remainder of the day. A responsible individual must stay with you for 24 hours following the procedure.  For the next 24 hours, DO NOT: -Drive a car -Operate machinery -Drink alcoholic beverages -Take any medication unless instructed by your physician -Make any legal decisions or sign important papers.  Meals: Start with liquid foods such as gelatin or soup. Progress to regular foods as tolerated. Avoid greasy, spicy, heavy foods. If nausea and/or vomiting occur, drink only clear liquids until the nausea and/or vomiting subsides. Call your physician if vomiting continues.  Special Instructions/Symptoms: Your throat may feel dry or sore from the anesthesia or the breathing tube placed in your throat during surgery. If this causes discomfort, gargle with warm salt water. The discomfort should disappear within 24 hours.  D & C Home care Instructions:   Personal hygiene:  Used sanitary napkins for vaginal drainage not tampons. Shower or tub bathe the day after your procedure. No douching until bleeding stops. Always wipe from front to back after  Elimination.  Activity: Do not drive or operate any equipment today. The effects of the anesthesia are still present and drowsiness may result. Rest today, not necessarily flat bed rest, just take it easy. You may resume your normal activity in one to 2 days.  Sexual activity: No intercourse for one week or as indicated by your physician  Diet: Eat a light diet as desired this evening. You may resume a regular diet tomorrow.  Return to work: One to 2 days.  General Expectations of your surgery: Vaginal bleeding should be no heavier than a normal period. Spotting may continue up to 10 days. Mild cramps may continue for a couple of days. You may have a regular period in 2-6 weeks.  Unexpected observations call your doctor if these occur: persistent or heavy bleeding.  Severe abdominal cramping or pain. Elevation of temperature greater than 100F.  Call for an appointment in one week.    

## 2024-01-25 NOTE — Anesthesia Postprocedure Evaluation (Signed)
 Anesthesia Post Note  Patient: Brelyn Woehl  Procedure(s) Performed: DILATATION & CURETTAGE/HYSTEROSCOPY WITH RESECTOCOPE     Patient location during evaluation: PACU Anesthesia Type: General Level of consciousness: awake Pain management: pain level controlled Vital Signs Assessment: post-procedure vital signs reviewed and stable Respiratory status: spontaneous breathing, nonlabored ventilation and respiratory function stable Cardiovascular status: blood pressure returned to baseline and stable Postop Assessment: no apparent nausea or vomiting Anesthetic complications: no   No notable events documented.  Last Vitals:  Vitals:   01/25/24 0830 01/25/24 0835  BP:    Pulse:    Resp:    Temp:    SpO2: 94% 95%    Last Pain:  Vitals:   01/25/24 0835  TempSrc:   PainSc: 0-No pain                 Delon Aisha Arch

## 2024-01-25 NOTE — Progress Notes (Signed)
The patient was re-examined with no change in status 

## 2024-01-25 NOTE — Transfer of Care (Signed)
 Immediate Anesthesia Transfer of Care Note  Patient: [name]  Procedure(s) Performed: DILATATION & CURETTAGE/HYSTEROSCOPY WITH RESECTOCOPE  Patient Location: PACU  Anesthesia Type:General  Level of Consciousness: awake  Airway & Oxygen Therapy: Patient Spontanous Breathing  Post-op Assessment: Report given to RN and Post -op Vital signs reviewed and stable  Post vital signs: Reviewed and stable  Last Vitals:  Vitals Value Taken Time  BP 134/75 01/25/24 08:30  Temp 36.5 C 01/25/24 08:15  Pulse 76 01/25/24 08:35  Resp 18 01/25/24 08:35  SpO2 88 % 01/25/24 08:35  Vitals shown include unfiled device data.  Last Pain:  Vitals:   01/25/24 0835  TempSrc:   PainSc: 0-No pain      Patients Stated Pain Goal: 5 (01/25/24 0535)  Complications: No notable events documented.

## 2024-01-25 NOTE — Anesthesia Procedure Notes (Signed)
 Procedure Name: LMA Insertion Date/Time: 01/25/2024 7:46 AM  Performed by: Sharie Joesph BRAVO, RNPre-anesthesia Checklist: Patient identified, Patient being monitored, Timeout performed, Emergency Drugs available and Suction available Patient Re-evaluated:Patient Re-evaluated prior to induction Oxygen Delivery Method: Circle system utilized Preoxygenation: Pre-oxygenation with 100% oxygen Induction Type: IV induction LMA: LMA inserted LMA Size: 4.0 Tube type: Oral Number of attempts: 1 Placement Confirmation: positive ETCO2 and breath sounds checked- equal and bilateral Tube secured with: Tape Dental Injury: Teeth and Oropharynx as per pre-operative assessment

## 2024-01-25 NOTE — Op Note (Signed)
 Pre op: endometrial polyp/AUB  Post op:  same   Proc: hysteroscopy, D and C with Myosure resection of polyp   Surg  Eliga Arvie  EBL  <10 cc  Specimen:endometrial polyp   Anesth:  LMA  Proced + findings:  pt taken to OR after approp time out and LMA, prepped and draped>>>paracx block with 10 cc 1% Xylo after NEg aspiration, ant cx grasped with tenaculum, sounded to 8 cm, dilated to 21 Pratt>>>cont flow scope inserted >>>cavity clean except for 1 well defined poly, completely resected with Myosure>>>no other complications>>>pt to PACU in good cond.  Charlie CHRISTELLA Croak MD

## 2024-01-26 ENCOUNTER — Encounter (HOSPITAL_COMMUNITY): Payer: Self-pay | Admitting: Obstetrics and Gynecology

## 2024-01-26 ENCOUNTER — Encounter: Admitting: Family Medicine

## 2024-01-26 LAB — SURGICAL PATHOLOGY

## 2024-01-26 NOTE — Progress Notes (Deleted)
 Office Note 01/26/2024  CC: No chief complaint on file.  Patient is a 68 y.o. female who is here for annual health maintenance exam and 66-month follow-up chronic pain syndrome, hypertension, and chronic renal insufficiency. A/P as of last visit: 1 chronic pain syndrome.  Chronic bilateral knee osteoarthritic pain.  Fibromyalgia. All pretty stable from a pain medication standpoint.  Refilled oxycodone  5 mg, 1-2 every 6 hours as needed, #90.  Continue gabapentin  600 mg 3 times daily and Cymbalta  60 mg a day. Urine drug screen today. She is followed by Ortho for her knees and is getting viscosupplementation injections. Hard to tell if this is helping.  She thinks she will probably eventually proceed with bilateral knee replacements.   2.  Hypertension, well-controlled on amlodipine  10 mg a day and Toprol -XL 50 mg a day. Electrolytes and creatinine monitoring today.   3.  Chronic renal insufficiency stage IIIa, patient with solitary kidney. Monitor renal function today.   She avoids NSAIDs and makes effort to hydrate adequately. She is followed by nephrology and they have plans to do renal ultrasound monitoring soon.   4.  Class I obesity, BMI 34, weight management. She continues to struggle.  Weight is up 4 pounds over the last 2 months. She takes phentermine  37.5 mg daily.  She elects to stay on this medication right now. CSC updated today. UDS UTD Jan this year.  INTERIM HX: She got uterine dilation and curettage yesterday by Dr. Johnnye for her abnormal uterine bleeding and endometrial polyp.    Indication for chronic opioid: chronic lumbar and bilat knee osteoarthritis , spine arthritis+ fibromyalgia pain that I treat with pain meds in order to maximize functioning and quality of life.  Not NSAID candidate due to chronic renal insufficiency. She is also taking cymbalta  60mg  qd and gabapentin  600 mg nightly usually.  Also Flexeril  pretty regularly. PMP AWARE reviewed today:  most recent rx for oxycodone  was filled 01/12/2024, # 90, rx by me.  Most recent phentermine  prescription filled 12/24/2023, #30, prescription by me. No red flags.  Past Medical History:  Diagnosis Date   Bilateral carpal tunnel syndrome    pt to get bilat carpal tunnel release as of 09/2018 ortho eval   Bilateral primary osteoarthritis of knee    L tricompart, R medial compart   BPPV (benign paroxysmal positional vertigo)    Cataract    Chronic pain syndrome    Chronic renal insufficiency, stage III (moderate) (HCC)    GFR 50's   Fibromyalgia    GERD (gastroesophageal reflux disease)    Heart murmur    History of galactorrhea 02/05/2011   History of kidney cancer    2013   Hyperlipidemia, mixed    Hypertension    IFG (impaired fasting glucose) 2016   HbA1c 5.7-5.8.   Knee pain, bilateral 02/05/2011   Lumbar spondylosis    Migraine syndrome    resolved after menopause   Overweight(278.02)    Pre-diabetes    Seasonal allergic rhinitis    Solitary left kidney 2013   Aquired: right nephrectomy for renal cancer    Past Surgical History:  Procedure Laterality Date   CARPAL TUNNEL RELEASE Right 09/2018   CESAREAN SECTION  1983   COLONOSCOPY  04/2013   Normal 2014.  01/2022 normal. Recall 65yrs d/t FH CRCA   DEXA     GYN scheduled this at 05/12/19 f/u.   DILATATION & CURRETTAGE/HYSTEROSCOPY WITH RESECTOCOPE N/A 01/25/2024   Procedure: DILATATION & CURETTAGE/HYSTEROSCOPY WITH RESECTOCOPE;  Surgeon:  Johnnye Ade, MD;  Location: [facility] OR;  Service: Gynecology;  Laterality: N/A;  MYOSURE LITE   ROBOT ASSISTED LAPAROSCOPIC NEPHRECTOMY  07/12/2012   Procedure: ROBOTIC ASSISTED LAPAROSCOPIC NEPHRECTOMY;  Surgeon: Ricardo Likens, MD;  Location: [facility] ORS;  Service: Urology;  Laterality: Right;   TONSILLECTOMY     TUBAL LIGATION      Family History  Problem Relation Age of Onset   Thyroid  disease Mother    Heart disease Mother        CHF, heart murmur, PAD   Kidney disease Mother         RAS   Other Mother 71       bowel obstruction,surgery this year   Colon cancer Father 64   Cancer Sister        cervical, breast   Thyroid  disease Sister    Thyroid  disease Sister    Diabetes Sister    Diabetes Sister    Hypertension Sister    Cancer Sister        kidney   Heart disease Brother    Cancer Paternal Aunt        breast cancer   Heart disease Maternal Grandmother    Thyroid  disease Maternal Grandmother    Thyroid  disease Maternal Grandfather    Cancer Maternal Grandfather    Diabetes Paternal Grandmother    Cancer Paternal Grandmother        breast   Heart disease Paternal Grandmother    Heart disease Paternal Grandfather    Diabetes Paternal Grandfather    Hypertension Paternal Grandfather    Asthma Daughter    Allergies Daughter    Lung disease Daughter        recurrent bronchitis   Lung disease Son        recurrent bronchitis   Colon cancer Paternal Great-grandmother    Crohn's disease Neg Hx    Esophageal cancer Neg Hx    Rectal cancer Neg Hx    Stomach cancer Neg Hx     Social History   Socioeconomic History   Marital status: Married    Spouse name: Milderd   Number of children: 3   Years of education: Not on file   Highest education level: Not on file  Occupational History   Not on file  Tobacco Use   Smoking status: Former    Current packs/day: 0.00    Average packs/day: 2.0 packs/day for 5.0 years (10.0 ttl pk-yrs)    Types: Cigarettes    Start date: 07/14/1976    Quit date: 07/14/1981    Years since quitting: 42.5    Passive exposure: Never   Smokeless tobacco: Never  Vaping Use   Vaping status: Never Used  Substance and Sexual Activity   Alcohol use: No   Drug use: No   Sexual activity: Yes  Other Topics Concern   Not on file  Social History Narrative   Married, 3 children.   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   No tobacco, alcohol, or drugs.  No exercise.   Social Drivers of Corporate investment banker Strain: Not on file  Food Insecurity: Not on file  Transportation Needs: Not on file  Physical Activity: Not on file  Stress: Not on file  Social Connections: Not on file  Intimate Partner Violence: Not on file    Outpatient Medications Prior to Visit  Medication Sig Dispense Refill   acetaminophen  (TYLENOL ) 500 MG tablet Take 1,000 mg by mouth every 8 (eight) hours as needed.     albuterol  (VENTOLIN  HFA) 108 (90 Base) MCG/ACT inhaler INHALE 2 PUFFS INTO THE LUNGS EVERY 6 HOURS AS NEEDED FOR WHEEZE OR SHORTNESS OF BREATH (Patient taking differently: 1-2 puffs. INHALE 2 PUFFS INTO THE LUNGS EVERY 6 HOURS AS NEEDED FOR WHEEZE OR SHORTNESS OF BREATH) 18 g 0   amLODipine  (NORVASC ) 10 MG tablet Take 1 tablet (10 mg total) by mouth daily. 90 tablet 3   atorvastatin  (LIPITOR) 80 MG tablet Take 1 tablet (80 mg total) by mouth daily. 90 tablet 3   cyclobenzaprine  (FLEXERIL ) 10 MG tablet TAKE 1 TABLET BY MOUTH THREE TIMES A DAY AS NEEDED 90 tablet 5   diphenhydramine-acetaminophen  (TYLENOL  PM) 25-500 MG TABS Take 1 tablet by mouth at bedtime as needed.     DULoxetine  (CYMBALTA ) 60 MG capsule Take 1 capsule (60 mg total) by mouth daily. TAKE 1 CAPSULE BY MOUTH EVERY DAY 90 capsule 3   fluconazole  (DIFLUCAN ) 150 MG tablet Take 1 tablet (150 mg total) by mouth daily. 1 tab po x 1 day (Patient not taking: Reported on 01/18/2024) 1 tablet 0   gabapentin  (NEURONTIN ) 300 MG capsule TAKE 2 CAPSULES BY MOUTH 3 TIMES A DAY 540 capsule 1   metoprolol  succinate (TOPROL  XL) 50 MG 24 hr tablet Take 1 tablet (50 mg total) by mouth daily. Take with or immediately following a meal. 90 tablet 3   Multiple Vitamin (MULTIVITAMIN WITH MINERALS) TABS Take 1 tablet by mouth daily.     mupirocin  ointment (BACTROBAN ) 2 % Apply to affected areas tid x 7d 15 g 0   OVER THE COUNTER MEDICATION Take 2 tablets by mouth  daily. Energize Caffeine Pills Fast Acting Energy Pill     oxyCODONE  (OXY IR/ROXICODONE ) 5 MG immediate release tablet 1-2 tabs po q6h prn 90 tablet 0   pantoprazole  (PROTONIX ) 40 MG tablet Take 1 tablet (40 mg total) by mouth daily. 90 tablet 3   phentermine  37.5 MG capsule Take 1 capsule (37.5 mg total) by mouth every morning. 30 capsule 5   promethazine  (PHENERGAN ) 12.5 MG tablet 1-2 tabs po q6h prn nausea 30 tablet 0   sennosides-docusate sodium  (SENOKOT-S) 8.6-50 MG tablet Take 1 tablet by mouth 2 (two) times daily. While taking pain meds to prevent constipation 30 tablet 0   Wheat Dextrin (BENEFIBER PO) Take by mouth.     predniSONE  (DELTASONE ) 20 MG tablet 2 tabs po every day x 5d (Patient not taking: Reported on 01/18/2024) 10 tablet 0   No facility-administered medications prior to visit.    Allergies  Allergen Reactions   Fenofibrate  Rash   Codeine Nausea Only   Dilaudid  [Hydromorphone  Hcl] Nausea And Vomiting    Review of Systems *** PE;    01/25/2024    8:15 AM 01/25/2024    5:35 AM 01/19/2024    9:18 AM  Vitals with BMI  Height  5' 2 5' 2  Weight  188 lbs 187 lbs  BMI  34.38 34.19  Systolic 124 158   Diastolic 70 80   Pulse 73 80      *** Pertinent labs:  Lab Results  Component Value Date   TSH 0.93 06/17/2021   Lab Results  Component Value Date   WBC 7.6 01/25/2024   HGB 12.3 01/25/2024   HCT 37.1 01/25/2024   MCV 94.6 01/25/2024   PLT 295 01/25/2024  Lab Results  Component Value Date   CREATININE 1.16 (H) 01/25/2024   BUN 17 01/25/2024   NA 135 01/25/2024   K 4.3 01/25/2024   CL 102 01/25/2024   CO2 23 01/25/2024   Lab Results  Component Value Date   ALT 16 12/23/2022   AST 17 12/23/2022   ALKPHOS 73 06/17/2021   BILITOT 0.5 12/23/2022   Lab Results  Component Value Date   CHOL 177 12/23/2022   Lab Results  Component Value Date   HDL 69 12/23/2022   Lab Results  Component Value Date   LDLCALC 76 12/23/2022   Lab Results   Component Value Date   TRIG 219 (H) 12/23/2022   Lab Results  Component Value Date   CHOLHDL 2.6 12/23/2022   Lab Results  Component Value Date   HGBA1C 5.8 (H) 12/23/2022   ASSESSMENT AND PLAN:   1 health maintenance exam: Reviewed age and gender appropriate health maintenance issues (prudent diet, regular exercise, health risks of tobacco and excessive alcohol, use of seatbelts, fire alarms in home, use of sunscreen).  Also reviewed age and gender appropriate health screening as well as vaccine recommendations. Vaccines: All UTD Labs: cbc, lipids, cmet Cervical ca screening: UTD Per GYN, Dr. Johnnye.*** Breast ca screening: UTD Per GYN, Dr. Johnnye.*** DEXA: UTD per gyn*** Colon ca screening: recall 01/2027    Chronic renal insufficiency stage IIIa, patient with solitary kidney. Serum creatinine stable at 1.16 yesterday. She avoids NSAIDs and makes effort to hydrate adequately. She is followed by nephrology   An After Visit Summary was printed and given to the patient.  FOLLOW UP:  No follow-ups on file.  Signed:  Gerlene Hockey, MD           01/26/2024

## 2024-01-27 ENCOUNTER — Ambulatory Visit: Payer: Self-pay

## 2024-01-27 NOTE — Telephone Encounter (Signed)
 FYI Only or Action Required?: FYI only for provider.  Patient was last seen in primary care on 12/11/2023 by McGowen, Aleene DEL, MD.  Called Nurse Triage reporting Tick Removal.  Symptoms began 2 days ago.  Interventions attempted: Nothing.  Symptoms are: gradually worsening.  Triage Disposition: See Physician Within 24 Hours  Patient/caregiver understands and will follow disposition?: No appointment, going to urgent care     Copied from CRM [id number]. Topic: Clinical - Red Word Triage >> Jan 27, 2024  1:05 PM Abigail D wrote: Red Word that prompted transfer to Nurse Triage: Swelling: Patient has a tick bite on the back of her neck, there is now a knot on the back of her neck and the bite itself is inflamed/swelling and hurts.     Reason for Disposition  [1] 2 to 14 days following tick bite AND [2] widespread rash or headache AND [3] no fever  Answer Assessment - Initial Assessment Questions 1. ATTACHED:  Is the tick still on the skin?  (e.g., yes, no, unsure)     No 2. ONSET - TICK STILL ATTACHED:  How long do you think the tick has been on your skin? (e.g., hours, days, unsure)  Note:  Is there a recent activity (camping, hiking) where the caller may have been exposed?     Unsure  3. ONSET - TICK NOT STILL ATTACHED: If the tick has been removed, how long do you think the tick was attached before you removed it? (e.g., 5 hours, 2 days). When was this?     2 days ago  4. LOCATION: Where is the tick bite located? (e.g., arm, leg)     Back of neck  5. TYPE of TICK: Is it a wood tick or a deer tick? (e.g., deer tick, wood tick; unsure)     Brown tick  6. SIZE of TICK: How big is the tick? (e.g., size of poppy seed, apple seed, watermelon seed; unsure) Note: Deer ticks can be the size of a poppy seed (nymph) or an apple seed (adult).       Unsure  7. ENGORGED: Did the tick look flat or engorged (full, swollen)? (e.g., flat, engorged; unsure)     Flat 8. OTHER  SYMPTOMS: Do you have any other symptoms? (e.g., fever, rash, redness at bite area, red ring around bite)     Painful swelling to bite  Protocols used: Tick Bite-A-AH

## 2024-01-27 NOTE — Telephone Encounter (Signed)
 No further action needed.

## 2024-01-28 DIAGNOSIS — S1096XA Insect bite of unspecified part of neck, initial encounter: Secondary | ICD-10-CM | POA: Diagnosis not present

## 2024-01-28 DIAGNOSIS — R21 Rash and other nonspecific skin eruption: Secondary | ICD-10-CM | POA: Diagnosis not present

## 2024-02-05 ENCOUNTER — Ambulatory Visit (INDEPENDENT_AMBULATORY_CARE_PROVIDER_SITE_OTHER): Admitting: Family Medicine

## 2024-02-05 ENCOUNTER — Encounter: Payer: Self-pay | Admitting: Family Medicine

## 2024-02-05 VITALS — BP 130/70 | HR 76 | Temp 97.9°F | Wt 191.8 lb

## 2024-02-05 DIAGNOSIS — S0096XA Insect bite (nonvenomous) of unspecified part of head, initial encounter: Secondary | ICD-10-CM

## 2024-02-05 DIAGNOSIS — W57XXXA Bitten or stung by nonvenomous insect and other nonvenomous arthropods, initial encounter: Secondary | ICD-10-CM

## 2024-02-05 MED ORDER — DOXYCYCLINE HYCLATE 100 MG PO TABS: 100.0000 mg | ORAL_TABLET | Freq: Two times a day (BID) | ORAL | 0 refills | Status: DC

## 2024-02-05 NOTE — Progress Notes (Signed)
 [name] , 02/21/1956, 68 y.o., female MRN: [id number] Patient Care Team    Relationship Specialty Notifications Start End  McGowen, Aleene DEL, MD PCP - General Family Medicine  10/22/11   Alvaro Ricardo KATHEE Mickey., MD Attending Physician Urology  05/28/12    Comment: [facility] (renal mass)  Cesario Boer, MD Consulting Physician Physical Medicine and Rehabilitation  05/28/12   Johnnye Ade, MD Consulting Physician Obstetrics and Gynecology  07/09/16   Shari Easter, MD Consulting Physician Orthopedic Surgery  07/30/18   Shari Easter, MD Consulting Physician Orthopedic Surgery  09/14/18   Avram Lupita BRAVO, MD Consulting Physician Gastroenterology  06/11/20     Chief Complaint  Patient presents with   Insect Bite    Back of the neck; went to UC 7 days ago; having HA since starting Abx     Subjective: [name] is a 68 y.o. Pt presents for an OV with complaints of concerns surrounding tick bites.  Patient reports she sustained a tick bite in the back of her neck 7 days ago.  I had noticed a headache and some neck tightness and noticed enlarged lymph nodes in the back of her neck which made her pain go looking for causes, and she found the tick attached at that time.  She was able to get the tick removed and went to the urgent care.  The urgent care placed her on 7 days of doxycycline  twice daily.  Patient reports she has had headaches and myalgias.  She denies any rash.  Denies fever. She endorses having fibromyalgia so it is difficult to tell if the discomfort was her fibromyalgia response to the tick bite.     10/27/2023   11:13 AM 04/02/2023    3:00 PM 12/23/2022    9:57 AM 11/13/2022    4:09 PM 07/09/2022   10:33 AM  Depression screen PHQ 2/9  Decreased Interest 0 1 0 0 0  Down, Depressed, Hopeless 0 0 0 0 0  PHQ - 2 Score 0 1 0 0 0  Altered sleeping   2 0   Tired, decreased energy   1 1   Change in appetite   0 0   Feeling bad or failure about yourself    1 0    Trouble concentrating   1 0   Moving slowly or fidgety/restless   0 0   Suicidal thoughts   0 0   PHQ-9 Score   5 1   Difficult doing work/chores   Not difficult at all Not difficult at all     Allergies  Allergen Reactions   Fenofibrate  Rash   Codeine Nausea Only   Dilaudid  [Hydromorphone  Hcl] Nausea And Vomiting   Social History   Social History Narrative   Married, 3 children.   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   No tobacco, alcohol, or drugs.  No exercise.   Past Medical History:  Diagnosis Date   Bilateral carpal tunnel syndrome    pt to get bilat carpal tunnel release as of 09/2018 ortho eval   Bilateral primary osteoarthritis of knee    L tricompart, R medial compart   BPPV (benign paroxysmal positional vertigo)    Cataract    Chronic pain syndrome    Chronic renal insufficiency, stage III (moderate) (HCC)    GFR 50's   Fibromyalgia  GERD (gastroesophageal reflux disease)    Heart murmur    History of galactorrhea 02/05/2011   History of kidney cancer    2013   Hyperlipidemia, mixed    Hypertension    IFG (impaired fasting glucose) 2016   HbA1c 5.7-5.8.   Knee pain, bilateral 02/05/2011   Lumbar spondylosis    Migraine syndrome    resolved after menopause   Overweight(278.02)    Pre-diabetes    Seasonal allergic rhinitis    Solitary left kidney 2013   Aquired: right nephrectomy for renal cancer   Past Surgical History:  Procedure Laterality Date   CARPAL TUNNEL RELEASE Right 09/2018   CESAREAN SECTION  1983   COLONOSCOPY  04/2013   Normal 2014.  01/2022 normal. Recall 23yrs d/t FH CRCA   DEXA     GYN scheduled this at 05/12/19 f/u.   DILATATION & CURRETTAGE/HYSTEROSCOPY WITH RESECTOCOPE N/A 01/25/2024   Procedure: DILATATION & CURETTAGE/HYSTEROSCOPY WITH RESECTOCOPE;  Surgeon: Johnnye Ade, MD;  Location: [facility]  OR;  Service: Gynecology;  Laterality: N/A;  MYOSURE LITE   ROBOT ASSISTED LAPAROSCOPIC NEPHRECTOMY  07/12/2012   Procedure: ROBOTIC ASSISTED LAPAROSCOPIC NEPHRECTOMY;  Surgeon: Ricardo Likens, MD;  Location: [facility];  Service: Urology;  Laterality: Right;   TONSILLECTOMY     TUBAL LIGATION     Family History  Problem Relation Age of Onset   Thyroid  disease Mother    Heart disease Mother        CHF, heart murmur, PAD   Kidney disease Mother        RAS   Other Mother 3       bowel obstruction,surgery this year   Colon cancer Father 22   Cancer Sister        cervical, breast   Thyroid  disease Sister    Thyroid  disease Sister    Diabetes Sister    Diabetes Sister    Hypertension Sister    Cancer Sister        kidney   Heart disease Brother    Cancer Paternal Aunt        breast cancer   Heart disease Maternal Grandmother    Thyroid  disease Maternal Grandmother    Thyroid  disease Maternal Grandfather    Cancer Maternal Grandfather    Diabetes Paternal Grandmother    Cancer Paternal Grandmother        breast   Heart disease Paternal Grandmother    Heart disease Paternal Grandfather    Diabetes Paternal Grandfather    Hypertension Paternal Grandfather    Asthma Daughter    Allergies Daughter    Lung disease Daughter        recurrent bronchitis   Lung disease Son        recurrent bronchitis   Colon cancer Paternal Great-grandmother    Crohn's disease Neg Hx    Esophageal cancer Neg Hx    Rectal cancer Neg Hx    Stomach cancer Neg Hx    Allergies as of 02/05/2024       Reactions   Fenofibrate  Rash   Codeine Nausea Only   Dilaudid  [hydromorphone  Hcl] Nausea And Vomiting        Medication List        Accurate as of February 05, 2024  3:01 PM. If you have any questions, ask your nurse or doctor.          3 AM MELATONIN SLEEP PO Take by mouth at bedtime. Over the counter   acetaminophen  500 MG tablet  Commonly known as: TYLENOL  Take 1,000 mg by mouth every 8  (eight) hours as needed.   albuterol  108 (90 Base) MCG/ACT inhaler Commonly known as: VENTOLIN  HFA INHALE 2 PUFFS INTO THE LUNGS EVERY 6 HOURS AS NEEDED FOR WHEEZE OR SHORTNESS OF BREATH What changed: how much to take   amLODipine  10 MG tablet Commonly known as: NORVASC  Take 1 tablet (10 mg total) by mouth daily.   atorvastatin  80 MG tablet Commonly known as: LIPITOR Take 1 tablet (80 mg total) by mouth daily.   BENEFIBER PO Take by mouth.   cyclobenzaprine  10 MG tablet Commonly known as: FLEXERIL  TAKE 1 TABLET BY MOUTH THREE TIMES A DAY AS NEEDED   diphenhydramine-acetaminophen  25-500 MG Tabs tablet Commonly known as: TYLENOL  PM Take 1 tablet by mouth at bedtime as needed.   doxycycline  100 MG capsule Commonly known as: VIBRAMYCIN  Take 100 mg by mouth every 12 (twelve) hours. What changed: Another medication with the same name was added. Make sure you understand how and when to take each. Changed by: Charlies Bellini   doxycycline  100 MG tablet Commonly known as: VIBRA -TABS Take 1 tablet (100 mg total) by mouth 2 (two) times daily. What changed: You were already taking a medication with the same name, and this prescription was added. Make sure you understand how and when to take each. Changed by: Charlies Bellini   DULoxetine  60 MG capsule Commonly known as: CYMBALTA  Take 1 capsule (60 mg total) by mouth daily. TAKE 1 CAPSULE BY MOUTH EVERY DAY   fluconazole  150 MG tablet Commonly known as: Diflucan  Take 1 tablet (150 mg total) by mouth daily. 1 tab po x 1 day   gabapentin  300 MG capsule Commonly known as: NEURONTIN  TAKE 2 CAPSULES BY MOUTH 3 TIMES A DAY   metoprolol  succinate 50 MG 24 hr tablet Commonly known as: Toprol  XL Take 1 tablet (50 mg total) by mouth daily. Take with or immediately following a meal.   multivitamin with minerals Tabs tablet Take 1 tablet by mouth daily.   mupirocin  ointment 2 % Commonly known as: BACTROBAN  Apply to affected areas tid x  7d   OVER THE COUNTER MEDICATION Take 2 tablets by mouth daily. Energize Caffeine Pills Fast Acting Energy Pill   oxyCODONE  5 MG immediate release tablet Commonly known as: Oxy IR/ROXICODONE  1-2 tabs po q6h prn   pantoprazole  40 MG tablet Commonly known as: PROTONIX  Take 1 tablet (40 mg total) by mouth daily.   phentermine  37.5 MG capsule Take 1 capsule (37.5 mg total) by mouth every morning.   promethazine  12.5 MG tablet Commonly known as: PHENERGAN  1-2 tabs po q6h prn nausea   sennosides-docusate sodium  8.6-50 MG tablet Commonly known as: SENOKOT-S Take 1 tablet by mouth 2 (two) times daily. While taking pain meds to prevent constipation        All past medical history, surgical history, allergies, family history, immunizations andmedications were updated in the EMR today and reviewed under the history and medication portions of their EMR.     ROS Negative, with the exception of above mentioned in HPI   Objective:  BP 130/70   Pulse 76   Temp 97.9 F (36.6 C)   Wt 191 lb 12.8 oz (87 kg)   SpO2 98%   BMI 35.08 kg/m  Body mass index is 35.08 kg/m.  Physical Exam Vitals and nursing note reviewed.  Constitutional:      General: She is not in acute distress.    Appearance: Normal appearance.  She is normal weight. She is not ill-appearing or toxic-appearing.  HENT:     Head: Normocephalic and atraumatic.  Eyes:     General: No scleral icterus.       Right eye: No discharge.        Left eye: No discharge.     Extraocular Movements: Extraocular movements intact.     Conjunctiva/sclera: Conjunctivae normal.     Pupils: Pupils are equal, round, and reactive to light.  Neck:     Comments: + right occipital enlargement of nodes.  Cardiovascular:     Rate and Rhythm: Normal rate and regular rhythm.  Pulmonary:     Effort: Pulmonary effort is normal.     Breath sounds: Normal breath sounds.  Musculoskeletal:     Cervical back: Tenderness present.   Lymphadenopathy:     Cervical: No cervical adenopathy.  Skin:    Findings: No rash.     Comments: X2 area of insect bite posterior neck. No remaining remnants of insect remain under magnification  Neurological:     Mental Status: She is alert and oriented to person, place, and time. Mental status is at baseline.     Motor: No weakness.     Coordination: Coordination normal.     Gait: Gait normal.  Psychiatric:        Mood and Affect: Mood normal.        Behavior: Behavior normal.        Thought Content: Thought content normal.        Judgment: Judgment normal.      No results found. No results found. No results found for this or any previous visit (from the past 24 hours).  Assessment/Plan: Pallavi Clifton is a 68 y.o. female present for OV for  Tick bite of head, unspecified part, initial encounter (Primary) Elected to extend Doxy bid while we wait on titers. She still has significant lymph node swelling  - B. burgdorfi antibodies - Rocky mtn spotted fvr abs pnl(IgG+IgM) - Doxy bid. 10 d - If titers are positive> would extend additional 7 day to current script.   Reviewed expectations re: course of current medical issues. Discussed self-management of symptoms. Outlined signs and symptoms indicating need for more acute intervention. Patient verbalized understanding and all questions were answered. Patient received an After-Visit Summary.    Orders Placed This Encounter  Procedures   B. burgdorfi antibodies   Rocky mtn spotted fvr abs pnl(IgG+IgM)   Meds ordered this encounter  Medications   doxycycline  (VIBRA -TABS) 100 MG tablet    Sig: Take 1 tablet (100 mg total) by mouth 2 (two) times daily.    Dispense:  20 tablet    Refill:  0   Referral Orders  No referral(s) requested today     Note is dictated utilizing voice recognition software. Although note has been proof read prior to signing, occasional typographical errors still can be missed. If any questions  arise, please do not hesitate to call for verification.   electronically signed by:  Charlies Bellini, DO  [facility] - OR

## 2024-02-05 NOTE — Patient Instructions (Addendum)

## 2024-02-08 ENCOUNTER — Ambulatory Visit: Payer: Self-pay | Admitting: Family Medicine

## 2024-02-09 ENCOUNTER — Ambulatory Visit (INDEPENDENT_AMBULATORY_CARE_PROVIDER_SITE_OTHER): Admitting: Family Medicine

## 2024-02-09 ENCOUNTER — Encounter: Payer: Self-pay | Admitting: Family Medicine

## 2024-02-09 VITALS — BP 114/60 | HR 85 | Temp 97.5°F | Ht 62.0 in | Wt 190.2 lb

## 2024-02-09 DIAGNOSIS — L281 Prurigo nodularis: Secondary | ICD-10-CM

## 2024-02-09 DIAGNOSIS — L989 Disorder of the skin and subcutaneous tissue, unspecified: Secondary | ICD-10-CM

## 2024-02-09 DIAGNOSIS — H6122 Impacted cerumen, left ear: Secondary | ICD-10-CM

## 2024-02-09 MED ORDER — DOXYCYCLINE HYCLATE 100 MG PO CAPS: 100.0000 mg | ORAL_CAPSULE | Freq: Two times a day (BID) | ORAL | 0 refills | Status: AC

## 2024-02-09 NOTE — Progress Notes (Signed)
 OFFICE VISIT  02/09/2024  CC:  Chief Complaint  Patient presents with   Ear Pain    Left x 1 week, fluid in ear and sometimes draining   Skin Concern    Spots all over body    Patient is a 68 y.o. female who presents for left ear pain and for skin lesion concerns.  INTERIM HX:  Left ear muffled and uncomfortable intermittently for at least the last several days.  For about the last 2 months she has had itchy red lesions on the skin of her face, arms, legs, and back. I prescribed her Bactroban  ointment when they first started in this helps some but it is a little difficult to tell whether they involve due to this medication or simply natural evolution of the lesion.  Generally it starts off with a bump with surrounding redness and is very itchy and crusts a couple of areas and then fades and leaves a spot of hyperpigmentation.  She says sometimes a couple of them have been deep. She describes 1 as having a blister initially. 1 in the center of her forehead has a central ulceration. No one else in her home has any similar lesions.  On 01/28/2024 she was seen at [facility] urgent care at [facility] for tick bite.  She was prescribed 7 days of doxycycline . She then presented here on 02/05/2024 with some question of neck tightness and aching as well as right occipital lymph node enlargement.  She was prescribed an additional 10 days of doxycycline . Lyme and RMSF antibodies were negative.  Kimbly has not had any new headaches, muscle aches, or fevers.  She says the doxycycline  tablets recently prescribed are hard on her stomach and she prefers a new prescription for that capsules.  ROS as above, plus-->  no CP, no SOB, no wheezing, no cough, no dizziness, no HAs, no melena/hematochezia.  No polyuria or polydipsia.  No focal weakness, paresthesias, or tremors.  No acute vision or hearing abnormalities.  No dysuria or unusual/new urinary urgency or frequency.  No recent changes in lower  legs. No n/v/d or abd pain.  No palpitations.     Past Medical History:  Diagnosis Date   Bilateral carpal tunnel syndrome    pt to get bilat carpal tunnel release as of 09/2018 ortho eval   Bilateral primary osteoarthritis of knee    L tricompart, R medial compart   BPPV (benign paroxysmal positional vertigo)    Cataract    Chronic pain syndrome    Chronic renal insufficiency, stage III (moderate) (HCC)    GFR 50's   Fibromyalgia    GERD (gastroesophageal reflux disease)    Heart murmur    History of galactorrhea 02/05/2011   History of kidney cancer    2013   Hyperlipidemia, mixed    Hypertension    IFG (impaired fasting glucose) 2016   HbA1c 5.7-5.8.   Knee pain, bilateral 02/05/2011   Lumbar spondylosis    Migraine syndrome    resolved after menopause   Overweight(278.02)    Pre-diabetes    Seasonal allergic rhinitis    Solitary left kidney 2013   Aquired: right nephrectomy for renal cancer    Past Surgical History:  Procedure Laterality Date   CARPAL TUNNEL RELEASE Right 09/2018   CESAREAN SECTION  1983   COLONOSCOPY  04/2013   Normal 2014.  01/2022 normal. Recall 18yrs d/t FH CRCA   DEXA     GYN scheduled this at 05/12/19 f/u.  DILATATION & CURRETTAGE/HYSTEROSCOPY WITH RESECTOCOPE N/A 01/25/2024   Procedure: DILATATION & CURETTAGE/HYSTEROSCOPY WITH RESECTOCOPE;  Surgeon: Johnnye Ade, MD;  Location: [facility] OR;  Service: Gynecology;  Laterality: N/A;  MYOSURE LITE   ROBOT ASSISTED LAPAROSCOPIC NEPHRECTOMY  07/12/2012   Procedure: ROBOTIC ASSISTED LAPAROSCOPIC NEPHRECTOMY;  Surgeon: Ricardo Likens, MD;  Location: [facility];  Service: Urology;  Laterality: Right;   TONSILLECTOMY     TUBAL LIGATION      Outpatient Medications Prior to Visit  Medication Sig Dispense Refill   acetaminophen  (TYLENOL ) 500 MG tablet Take 1,000 mg by mouth every 8 (eight) hours as needed.     albuterol  (VENTOLIN  HFA) 108 (90 Base) MCG/ACT inhaler INHALE 2 PUFFS INTO THE LUNGS EVERY 6  HOURS AS NEEDED FOR WHEEZE OR SHORTNESS OF BREATH (Patient taking differently: 1-2 puffs. INHALE 2 PUFFS INTO THE LUNGS EVERY 6 HOURS AS NEEDED FOR WHEEZE OR SHORTNESS OF BREATH) 18 g 0   amLODipine  (NORVASC ) 10 MG tablet Take 1 tablet (10 mg total) by mouth daily. 90 tablet 3   atorvastatin  (LIPITOR) 80 MG tablet Take 1 tablet (80 mg total) by mouth daily. 90 tablet 3   cyclobenzaprine  (FLEXERIL ) 10 MG tablet TAKE 1 TABLET BY MOUTH THREE TIMES A DAY AS NEEDED 90 tablet 5   diphenhydramine-acetaminophen  (TYLENOL  PM) 25-500 MG TABS Take 1 tablet by mouth at bedtime as needed.     DULoxetine  (CYMBALTA ) 60 MG capsule Take 1 capsule (60 mg total) by mouth daily. TAKE 1 CAPSULE BY MOUTH EVERY DAY 90 capsule 3   fluconazole  (DIFLUCAN ) 150 MG tablet Take 1 tablet (150 mg total) by mouth daily. 1 tab po x 1 day 1 tablet 0   gabapentin  (NEURONTIN ) 300 MG capsule TAKE 2 CAPSULES BY MOUTH 3 TIMES A DAY 540 capsule 1   Melatonin-Theanine (3 AM MELATONIN SLEEP PO) Take by mouth at bedtime. Over the counter     metoprolol  succinate (TOPROL  XL) 50 MG 24 hr tablet Take 1 tablet (50 mg total) by mouth daily. Take with or immediately following a meal. 90 tablet 3   Multiple Vitamin (MULTIVITAMIN WITH MINERALS) TABS Take 1 tablet by mouth daily.     mupirocin  ointment (BACTROBAN ) 2 % Apply to affected areas tid x 7d 15 g 0   OVER THE COUNTER MEDICATION Take 2 tablets by mouth daily. Energize Caffeine Pills Fast Acting Energy Pill     oxyCODONE  (OXY IR/ROXICODONE ) 5 MG immediate release tablet 1-2 tabs po q6h prn 90 tablet 0   pantoprazole  (PROTONIX ) 40 MG tablet Take 1 tablet (40 mg total) by mouth daily. 90 tablet 3   phentermine  37.5 MG capsule Take 1 capsule (37.5 mg total) by mouth every morning. 30 capsule 5   promethazine  (PHENERGAN ) 12.5 MG tablet 1-2 tabs po q6h prn nausea 30 tablet 0   sennosides-docusate sodium  (SENOKOT-S) 8.6-50 MG tablet Take 1 tablet by mouth 2 (two) times daily. While taking pain meds to  prevent constipation 30 tablet 0   Wheat Dextrin (BENEFIBER PO) Take by mouth.     doxycycline  (VIBRA -TABS) 100 MG tablet Take 1 tablet (100 mg total) by mouth 2 (two) times daily. 20 tablet 0   doxycycline  (VIBRAMYCIN ) 100 MG capsule Take 100 mg by mouth every 12 (twelve) hours.     No facility-administered medications prior to visit.    Allergies  Allergen Reactions   Fenofibrate  Rash   Codeine Nausea Only   Dilaudid  [Hydromorphone  Hcl] Nausea And Vomiting    Review of Systems As  per HPI  PE:    02/09/2024   11:42 AM 02/05/2024    2:42 PM 01/25/2024    8:15 AM  Vitals with BMI  Height 5' 2    Weight 190 lbs 3 oz 191 lbs 13 oz   BMI 34.78 35.07   Systolic 114 130 875  Diastolic 60 70 70  Pulse 85 76 73     Physical Exam  General: Alert and well-appearing. Left ear canal has a thin film of cerumen blocking the canal 95%.  This was removed with curette and tympanic membrane appeared normal.  The canal is not erythematous or swollen and there is no exudate. Skin: She has sparsely scattered erythematous flat papules that range from 2 mm to 8 mm in size.  The surface is excoriated on a couple of them.  1 in the central forehead area has a 1 mm shallow ulceration. The lesions are located on the arms and face for the most part.  There are a couple of hyperpigmented areas on the left lower leg where there were more acute lesions in the past. No furuncles, no pustules, no vesicles. Mouth and oropharynx without lesion. There are no lesions on her hands.  LABS:  Last metabolic panel Lab Results  Component Value Date   GLUCOSE 102 (H) 01/25/2024   NA 135 01/25/2024   K 4.3 01/25/2024   CL 102 01/25/2024   CO2 23 01/25/2024   BUN 17 01/25/2024   CREATININE 1.16 (H) 01/25/2024   GFRNONAA 52 (L) 01/25/2024   CALCIUM  8.8 (L) 01/25/2024   PHOS 3.5 02/04/2012   PROT 6.3 12/23/2022   ALBUMIN 4.6 06/17/2021   BILITOT 0.5 12/23/2022   ALKPHOS 73 06/17/2021   AST 17 12/23/2022    ALT 16 12/23/2022   ANIONGAP 10 01/25/2024   IMPRESSION AND PLAN:  #1 pruritic skin lesions. Unknown etiology. Question MRSA. I did punch biopsy today and put a culture swab in the punch cavity and sent this to the lab. Continue Bactroban  ointment to the lesions and we will continue her on doxycycline  for 14 more days (she was originally put on this recently for empiric treatment for tickborne disease).  Punch biopsy procedure: After informed consent was obtained, using Betadine  for cleansing and 1% Lidocaine  with epinephrine for anesthetic, with sterile technique a 4 mm punch biopsy was used to obtain a biopsy specimen of the lesion. Hemostasis was obtained by pressure.  I did not place a suture. Will allow wound to heal by secondary intention.  Wound care instructions provided. Be alert for any signs of cutaneous infection. The specimen is labeled and sent to pathology for evaluation. The procedure was well tolerated without complications.  #2 left ear cerumen impaction.  Removed today with curette. Patient had improvement in symptoms.  An After Visit Summary was printed and given to the patient.  FOLLOW UP: 1 week  Signed:  Gerlene Hockey, MD           02/09/2024

## 2024-02-10 LAB — ROCKY MTN SPOTTED FVR ABS PNL(IGG+IGM)
RMSF IgG: NOT DETECTED
RMSF IgM: NOT DETECTED

## 2024-02-10 LAB — B. BURGDORFI ANTIBODIES: B burgdorferi Ab IgG+IgM: <0.9 {index}

## 2024-02-13 LAB — WOUND CULTURE
MICRO NUMBER:: 16759521
SPECIMEN QUALITY:: ADEQUATE

## 2024-02-14 ENCOUNTER — Ambulatory Visit: Payer: Self-pay | Admitting: Family Medicine

## 2024-02-15 LAB — DERMATOLOGY PATHOLOGY

## 2024-02-16 ENCOUNTER — Other Ambulatory Visit: Payer: Self-pay | Admitting: Family Medicine

## 2024-02-16 MED ORDER — OXYCODONE HCL 5 MG PO TABS: ORAL_TABLET | ORAL | 0 refills | Status: DC

## 2024-02-16 NOTE — Telephone Encounter (Unsigned)
 Copied from CRM #[id number]. Topic: Clinical - Medication Refill >> Feb 16, 2024  9:33 AM Jayma L wrote: Medication: oxyCODONE  (OXY IR/ROXICODONE ) 5 MG immediate release tablet  Has the patient contacted their pharmacy? No (Agent: If no, request that the patient contact the pharmacy for the refill. If patient does not wish to contact the pharmacy document the reason why and proceed with request.) (Agent: If yes, when and what did the pharmacy advise?)  This is the patient's preferred pharmacy:  [id number] - [place], [place] - [address] [place] [address] Phone: [phone] Fax: [phone]   Is this the correct pharmacy for this prescription? Yes If no, delete pharmacy and type the correct one.   Has the prescription been filled recently? No  Is the patient out of the medication? No  Has the patient been seen for an appointment in the last year OR does the patient have an upcoming appointment? Yes  Can we respond through MyChart? No  Agent: Please be advised that Rx refills may take up to 3 business days. We ask that you follow-up with your pharmacy.

## 2024-02-16 NOTE — Telephone Encounter (Signed)
 Prescription sent. Needs to follow-up in the next 4 to 6 weeks for recheck on her pain medication and we can do physical at that time.

## 2024-02-17 ENCOUNTER — Telehealth: Payer: Self-pay

## 2024-02-17 ENCOUNTER — Ambulatory Visit: Admitting: Family Medicine

## 2024-02-17 ENCOUNTER — Encounter: Payer: Self-pay | Admitting: Family Medicine

## 2024-02-17 VITALS — BP 118/69 | HR 70 | Temp 98.2°F | Ht 62.0 in | Wt 190.2 lb

## 2024-02-17 DIAGNOSIS — L281 Prurigo nodularis: Secondary | ICD-10-CM | POA: Diagnosis not present

## 2024-02-17 DIAGNOSIS — M797 Fibromyalgia: Secondary | ICD-10-CM | POA: Diagnosis not present

## 2024-02-17 DIAGNOSIS — G894 Chronic pain syndrome: Secondary | ICD-10-CM | POA: Diagnosis not present

## 2024-02-17 DIAGNOSIS — M170 Bilateral primary osteoarthritis of knee: Secondary | ICD-10-CM

## 2024-02-17 DIAGNOSIS — M25561 Pain in right knee: Secondary | ICD-10-CM

## 2024-02-17 DIAGNOSIS — F5101 Primary insomnia: Secondary | ICD-10-CM

## 2024-02-17 DIAGNOSIS — M25562 Pain in left knee: Secondary | ICD-10-CM | POA: Diagnosis not present

## 2024-02-17 DIAGNOSIS — G8929 Other chronic pain: Secondary | ICD-10-CM

## 2024-02-17 MED ORDER — TRAZODONE HCL 50 MG PO TABS
ORAL_TABLET | ORAL | 1 refills | Status: DC
Start: 2024-02-17 — End: 2024-04-18

## 2024-02-17 MED ORDER — TRIAMCINOLONE ACETONIDE 0.5 % EX OINT
TOPICAL_OINTMENT | CUTANEOUS | 3 refills | Status: DC
Start: 2024-02-17 — End: 2024-04-11

## 2024-02-17 MED ORDER — ESZOPICLONE 1 MG PO TABS: 1.0000 mg | ORAL_TABLET | Freq: Every evening | ORAL | 0 refills | Status: DC | PRN

## 2024-02-17 NOTE — Telephone Encounter (Signed)
 Pt advised of medication update

## 2024-02-17 NOTE — Progress Notes (Addendum)
 OFFICE VISIT  02/17/2024  CC:  Chief Complaint  Patient presents with   Follow-up    1 week; pt states feeling slightly better but equilibrium is off, feeling off balance   Patient is a 68 y.o. female who presents for 1 week follow-up rash and chronic pain syndrome. A/P as of last visit: #1 pruritic skin lesions. Unknown etiology. Question MRSA. I did punch biopsy today and put a culture swab in the punch cavity and sent this to the lab. Continue Bactroban  ointment to the lesions and we will continue her on doxycycline  for 14 more days (she was originally put on this recently for empiric treatment for tickborne disease).   #2 left ear cerumen impaction.  Removed today with curette. Patient had improvement in symptoms.  INTERIM HX: Rash is improved, lesions shrinking and less itchy. No new lesions. The skin punch biopsy showed prurigo nodularis.  Long hx, many years, of insomnia.  Problems initiating as well as maintaining. Trying melatonin, tylenol  pm, , flexeril :  all not much help.  Describes some problems with poor sleep hygiene. Has never been on rx sleep aid. No RLS.  Pain control stable. Indication for chronic opioid: chronic lumbar and bilat knee osteoarthritis , spine arthritis+ fibromyalgia pain that I treat with pain meds in order to maximize functioning and quality of life.  Not NSAID candidate due to chronic renal insufficiency. She is also taking cymbalta  60mg  qd and gabapentin  600 mg nightly usually.  Also Flexeril  pretty regularly. PMP AWARE reviewed today: most recent rx for oxycodone  was filled 02/05/24, # 90, rx by me.  Most recent phentermine  rx filled 02/05/24, #30. No red flags.  ROS as above, plus--> no fevers, no CP, no SOB, no wheezing, no cough, no dizziness, no HAs, no rashes, no melena/hematochezia.  No polyuria or polydipsia.   No focal weakness, paresthesias, or tremors.  No acute vision or hearing abnormalities.  No dysuria or unusual/new urinary  urgency or frequency.  No recent changes in lower legs. No n/v/d or abd pain.  No palpitations.    Past Medical History:  Diagnosis Date   Bilateral carpal tunnel syndrome    pt to get bilat carpal tunnel release as of 09/2018 ortho eval   Bilateral primary osteoarthritis of knee    L tricompart, R medial compart   BPPV (benign paroxysmal positional vertigo)    Cataract    Chronic pain syndrome    Chronic renal insufficiency, stage III (moderate) (HCC)    GFR 50's   Fibromyalgia    GERD (gastroesophageal reflux disease)    Heart murmur    History of galactorrhea 02/05/2011   History of kidney cancer    2013   Hyperlipidemia, mixed    Hypertension    IFG (impaired fasting glucose) 2016   HbA1c 5.7-5.8.   Knee pain, bilateral 02/05/2011   Lumbar spondylosis    Migraine syndrome    resolved after menopause   Overweight(278.02)    Pre-diabetes    Seasonal allergic rhinitis    Solitary left kidney 2013   Aquired: right nephrectomy for renal cancer    Past Surgical History:  Procedure Laterality Date   CARPAL TUNNEL RELEASE Right 09/2018   CESAREAN SECTION  1983   COLONOSCOPY  04/2013   Normal 2014.  01/2022 normal. Recall 53yrs d/t FH CRCA   DEXA     GYN scheduled this at 05/12/19 f/u.   DILATATION & CURRETTAGE/HYSTEROSCOPY WITH RESECTOCOPE N/A 01/25/2024   Procedure: DILATATION & CURETTAGE/HYSTEROSCOPY WITH RESECTOCOPE;  Surgeon: Johnnye Ade, MD;  Location: [facility] OR;  Service: Gynecology;  Laterality: N/A;  MYOSURE LITE   ROBOT ASSISTED LAPAROSCOPIC NEPHRECTOMY  07/12/2012   Procedure: ROBOTIC ASSISTED LAPAROSCOPIC NEPHRECTOMY;  Surgeon: Ricardo Likens, MD;  Location: [facility];  Service: Urology;  Laterality: Right;   TONSILLECTOMY     TUBAL LIGATION      Outpatient Medications Prior to Visit  Medication Sig Dispense Refill   acetaminophen  (TYLENOL ) 500 MG tablet Take 1,000 mg by mouth every 8 (eight) hours as needed.     albuterol  (VENTOLIN  HFA) 108 (90 Base) MCG/ACT  inhaler INHALE 2 PUFFS INTO THE LUNGS EVERY 6 HOURS AS NEEDED FOR WHEEZE OR SHORTNESS OF BREATH (Patient taking differently: 1-2 puffs. INHALE 2 PUFFS INTO THE LUNGS EVERY 6 HOURS AS NEEDED FOR WHEEZE OR SHORTNESS OF BREATH) 18 g 0   amLODipine  (NORVASC ) 10 MG tablet Take 1 tablet (10 mg total) by mouth daily. 90 tablet 3   atorvastatin  (LIPITOR) 80 MG tablet Take 1 tablet (80 mg total) by mouth daily. 90 tablet 3   cyclobenzaprine  (FLEXERIL ) 10 MG tablet TAKE 1 TABLET BY MOUTH THREE TIMES A DAY AS NEEDED 90 tablet 5   diphenhydramine-acetaminophen  (TYLENOL  PM) 25-500 MG TABS Take 1 tablet by mouth at bedtime as needed.     doxycycline  (VIBRAMYCIN ) 100 MG capsule Take 1 capsule (100 mg total) by mouth 2 (two) times daily for 14 days. 28 capsule 0   DULoxetine  (CYMBALTA ) 60 MG capsule Take 1 capsule (60 mg total) by mouth daily. TAKE 1 CAPSULE BY MOUTH EVERY DAY 90 capsule 3   gabapentin  (NEURONTIN ) 300 MG capsule TAKE 2 CAPSULES BY MOUTH 3 TIMES A DAY 540 capsule 1   Melatonin-Theanine (3 AM MELATONIN SLEEP PO) Take by mouth at bedtime. Over the counter     metoprolol  succinate (TOPROL  XL) 50 MG 24 hr tablet Take 1 tablet (50 mg total) by mouth daily. Take with or immediately following a meal. 90 tablet 3   Multiple Vitamin (MULTIVITAMIN WITH MINERALS) TABS Take 1 tablet by mouth daily.     mupirocin  ointment (BACTROBAN ) 2 % Apply to affected areas tid x 7d 15 g 0   OVER THE COUNTER MEDICATION Take 2 tablets by mouth daily. Energize Caffeine Pills Fast Acting Energy Pill     oxyCODONE  (OXY IR/ROXICODONE ) 5 MG immediate release tablet 1-2 tabs po q6h prn 90 tablet 0   pantoprazole  (PROTONIX ) 40 MG tablet Take 1 tablet (40 mg total) by mouth daily. 90 tablet 3   phentermine  37.5 MG capsule Take 1 capsule (37.5 mg total) by mouth every morning. 30 capsule 5   promethazine  (PHENERGAN ) 12.5 MG tablet 1-2 tabs po q6h prn nausea 30 tablet 0   sennosides-docusate sodium  (SENOKOT-S) 8.6-50 MG tablet Take 1  tablet by mouth 2 (two) times daily. While taking pain meds to prevent constipation 30 tablet 0   Wheat Dextrin (BENEFIBER PO) Take by mouth.     fluconazole  (DIFLUCAN ) 150 MG tablet Take 1 tablet (150 mg total) by mouth daily. 1 tab po x 1 day 1 tablet 0   No facility-administered medications prior to visit.    Allergies  Allergen Reactions   Fenofibrate  Rash   Codeine Nausea Only   Dilaudid  [Hydromorphone  Hcl] Nausea And Vomiting    Review of Systems As per HPI  PE:    02/17/2024   10:21 AM 02/09/2024   11:42 AM 02/05/2024    2:42 PM  Vitals with BMI  Height 5' 2  5' 2   Weight 190 lbs 3 oz 190 lbs 3 oz 191 lbs 13 oz  BMI 34.78 34.78 35.07  Systolic 118 114 869  Diastolic 69 60 70  Pulse 70 85 76     Physical Exam  Gen: Alert, well appearing.  Patient is oriented to person, place, time, and situation. SKIN: face and arms and upper chest with sparsely scattered small pinkish/erythematous macules and papules. No vesicles or excoriations.  LABS:  Last metabolic panel Lab Results  Component Value Date   GLUCOSE 102 (H) 01/25/2024   NA 135 01/25/2024   K 4.3 01/25/2024   CL 102 01/25/2024   CO2 23 01/25/2024   BUN 17 01/25/2024   CREATININE 1.16 (H) 01/25/2024   GFRNONAA 52 (L) 01/25/2024   CALCIUM  8.8 (L) 01/25/2024   PHOS 3.5 02/04/2012   PROT 6.3 12/23/2022   ALBUMIN 4.6 06/17/2021   BILITOT 0.5 12/23/2022   ALKPHOS 73 06/17/2021   AST 17 12/23/2022   ALT 16 12/23/2022   ANIONGAP 10 01/25/2024   IMPRESSION AND PLAN:  1) Skin lesions: bx showed prurigo nodularis. Discussed with pt, nonspecific, may recur. Treatment at this time is potent topical steroid:  triamcinolone  0.5% ointment bid prn. Oral antihistamine prn.  2) Chronic pain syndrome, high risk med use. Stable osteoarthritis and fibromyalgia pain. Oxycodone  5mg , 1-2 q6h prn, #90 per month. CSC and UDS UTD.  3) Chronic insomnia. Discussed importance of proper sleep hygiene. Will do trial of  lunesta .  If insurer does not cover this then will dry low dose trazedone-->50-100 mg at bedtime prn.  An After Visit Summary was printed and given to the patient.  FOLLOW UP: No follow-ups on file.  Signed:  Gerlene Hockey, MD           02/17/2024

## 2024-02-17 NOTE — Telephone Encounter (Signed)
 Okay, trazodone  prescribed

## 2024-03-21 ENCOUNTER — Other Ambulatory Visit: Payer: Self-pay | Admitting: Family Medicine

## 2024-03-21 MED ORDER — OXYCODONE HCL 5 MG PO TABS: ORAL_TABLET | ORAL | 0 refills | Status: DC

## 2024-03-21 NOTE — Telephone Encounter (Signed)
 Copied from CRM [id number]. Topic: Clinical - Medication Refill >> Mar 21, 2024 11:09 AM Corin V wrote: Medication: oxyCODONE  (OXY IR/ROXICODONE ) 5 MG immediate release tablet  Has the patient contacted their pharmacy? Yes (Agent: If no, request that the patient contact the pharmacy for the refill. If patient does not wish to contact the pharmacy document the reason why and proceed with request.) (Agent: If yes, when and what did the pharmacy advise?)  This is the patient's preferred pharmacy:  [id number] - [place], [place] - [address] [place] [address] Phone: [phone] Fax: [phone]  Is this the correct pharmacy for this prescription? Yes If no, delete pharmacy and type the correct one.   Has the prescription been filled recently? No  Is the patient out of the medication? Yes- took last one today  Has the patient been seen for an appointment in the last year OR does the patient have an upcoming appointment? Yes  Can we respond through MyChart? No  Agent: Please be advised that Rx refills may take up to 3 business days. We ask that you follow-up with your pharmacy.

## 2024-03-21 NOTE — Telephone Encounter (Signed)
 Requesting: oxycodone  Contract: 10/27/23 UDS: 07/17/23 Last Visit: 02/17/24 Next Visit: 05/25/24 Last Refill: 02/16/24 (90,0)  Please Advise. Rx pending

## 2024-03-21 NOTE — Telephone Encounter (Signed)
Pt advised refill sent. °

## 2024-04-06 ENCOUNTER — Telehealth: Payer: Self-pay

## 2024-04-06 NOTE — Telephone Encounter (Signed)
 Spoke with patient, she still has lesion on just her arm but multiple spots, describes as dry and hard. She is still using the triamcinolone  cream but not complete yet.  She is currently scheduled for Monday Sept 29 at 2pm

## 2024-04-06 NOTE — Telephone Encounter (Signed)
 I will need to take a look at it in the office in order to best advise her. Is she using the triamcinolone  cream I prescribed last visit? Is the arm the only place that still has a lesion?

## 2024-04-06 NOTE — Telephone Encounter (Signed)
 Communication  Reason for CRM: Patient has had skin lesion right arm for 6 to 8 months and it hasn't healed she is concerned about it and wants to know what to do next and would like the Dr Candise or his nurse to give her a call today concerning the issue on    Pt was last seen on 02/09/24 regarding this issue.

## 2024-04-06 NOTE — Telephone Encounter (Signed)
 Sounds good

## 2024-04-07 NOTE — Telephone Encounter (Signed)
 No further action needed.

## 2024-04-11 ENCOUNTER — Encounter: Payer: Self-pay | Admitting: Family Medicine

## 2024-04-11 ENCOUNTER — Ambulatory Visit (INDEPENDENT_AMBULATORY_CARE_PROVIDER_SITE_OTHER): Admitting: Family Medicine

## 2024-04-11 VITALS — BP 132/74 | HR 89 | Temp 97.6°F | Ht 62.0 in | Wt 193.8 lb

## 2024-04-11 DIAGNOSIS — G894 Chronic pain syndrome: Secondary | ICD-10-CM

## 2024-04-11 DIAGNOSIS — M150 Primary generalized (osteo)arthritis: Secondary | ICD-10-CM

## 2024-04-11 DIAGNOSIS — L281 Prurigo nodularis: Secondary | ICD-10-CM

## 2024-04-11 DIAGNOSIS — Z79899 Other long term (current) drug therapy: Secondary | ICD-10-CM

## 2024-04-11 DIAGNOSIS — M797 Fibromyalgia: Secondary | ICD-10-CM | POA: Diagnosis not present

## 2024-04-11 MED ORDER — TRIAMCINOLONE ACETONIDE 0.5 % EX OINT: TOPICAL_OINTMENT | CUTANEOUS | 3 refills | Status: AC

## 2024-04-11 MED ORDER — PHENTERMINE HCL 37.5 MG PO CAPS: 37.5000 mg | ORAL_CAPSULE | ORAL | 5 refills | Status: AC

## 2024-04-11 MED ORDER — OXYCODONE HCL 10 MG PO TABS: ORAL_TABLET | ORAL | 0 refills | Status: DC

## 2024-04-11 NOTE — Progress Notes (Signed)
 OFFICE VISIT  04/11/2024  CC:  Chief Complaint  Patient presents with   Follow-up    Skin lesion     Patient is a 67 y.o. female who presents for follow-up rash/skin lesion.  INTERIM HX: Still has some pink firm oval lesions on the extensor surface of the right forearm, itchy. Skin biopsy 02/09/2024 showed prurigo nodularis. She has been using the triamcinolone  cream and vitamin E cream.  Her chronic pain is worsening over the last 6 to 12 months.  She wonders if she can increase the dose of her oxycodone . She currently takes Cymbalta  60 mg a day, gabapentin  600 mg 3 times a day, Flexeril  10 mg 3 times daily as needed, and oxycodone  5 mg, 1-2 every 6 hours as needed. Lately she has been taking 2 of her 5 mg tabs to get any significant amount of relief. She hurts from neck down to feet, myofascial areas as well as axial spine pain from neck to sacrum.  Also significant bilateral knee pain from osteoarthritis. Has bilateral heel pain that waxes and wanes (for years). PMP AWARE reviewed today: most recent rx for oxycodone  was filled 03/21/2024, # 90, rx by me.  Most recent phentermine  prescription was filled 03/10/2024, #30, prescription by me. No red flags.  Past Medical History:  Diagnosis Date   Bilateral carpal tunnel syndrome    pt to get bilat carpal tunnel release as of 09/2018 ortho eval   Bilateral primary osteoarthritis of knee    L tricompart, R medial compart   BPPV (benign paroxysmal positional vertigo)    Cataract    Chronic pain syndrome    Chronic renal insufficiency, stage III (moderate)    GFR 50's   Fibromyalgia    GERD (gastroesophageal reflux disease)    Heart murmur    History of galactorrhea 02/05/2011   History of kidney cancer    2013   Hyperlipidemia, mixed    Hypertension    IFG (impaired fasting glucose) 2016   HbA1c 5.7-5.8.   Knee pain, bilateral 02/05/2011   Lumbar spondylosis    Migraine syndrome    resolved after menopause    Overweight(278.02)    Pre-diabetes    Prurigo nodularis    Seasonal allergic rhinitis    Solitary left kidney 2013   Aquired: right nephrectomy for renal cancer    Past Surgical History:  Procedure Laterality Date   CARPAL TUNNEL RELEASE Right 09/2018   CESAREAN SECTION  1983   COLONOSCOPY  04/2013   Normal 2014.  01/2022 normal. Recall 58yrs d/t FH CRCA   DEXA     GYN scheduled this at 05/12/19 f/u.   DILATATION & CURRETTAGE/HYSTEROSCOPY WITH RESECTOCOPE N/A 01/25/2024   Procedure: DILATATION & CURETTAGE/HYSTEROSCOPY WITH RESECTOCOPE;  Surgeon: Johnnye Ade, MD;  Location: [facility] OR;  Service: Gynecology;  Laterality: N/A;  MYOSURE LITE   ROBOT ASSISTED LAPAROSCOPIC NEPHRECTOMY  07/12/2012   Procedure: ROBOTIC ASSISTED LAPAROSCOPIC NEPHRECTOMY;  Surgeon: Ricardo Likens, MD;  Location: [facility];  Service: Urology;  Laterality: Right;   TONSILLECTOMY     TUBAL LIGATION      Outpatient Medications Prior to Visit  Medication Sig Dispense Refill   acetaminophen  (TYLENOL ) 500 MG tablet Take 1,000 mg by mouth every 8 (eight) hours as needed.     albuterol  (VENTOLIN  HFA) 108 (90 Base) MCG/ACT inhaler INHALE 2 PUFFS INTO THE LUNGS EVERY 6 HOURS AS NEEDED FOR WHEEZE OR SHORTNESS OF BREATH (Patient taking differently: 1-2 puffs. INHALE 2 PUFFS INTO THE LUNGS EVERY  6 HOURS AS NEEDED FOR WHEEZE OR SHORTNESS OF BREATH) 18 g 0   amLODipine  (NORVASC ) 10 MG tablet Take 1 tablet (10 mg total) by mouth daily. 90 tablet 3   atorvastatin  (LIPITOR) 80 MG tablet Take 1 tablet (80 mg total) by mouth daily. 90 tablet 3   cyclobenzaprine  (FLEXERIL ) 10 MG tablet TAKE 1 TABLET BY MOUTH THREE TIMES A DAY AS NEEDED 90 tablet 5   diphenhydramine-acetaminophen  (TYLENOL  PM) 25-500 MG TABS Take 1 tablet by mouth at bedtime as needed.     DULoxetine  (CYMBALTA ) 60 MG capsule Take 1 capsule (60 mg total) by mouth daily. TAKE 1 CAPSULE BY MOUTH EVERY DAY 90 capsule 3   gabapentin  (NEURONTIN ) 300 MG capsule TAKE 2 CAPSULES BY  MOUTH 3 TIMES A DAY 540 capsule 1   Melatonin-Theanine (3 AM MELATONIN SLEEP PO) Take by mouth at bedtime. Over the counter     metoprolol  succinate (TOPROL  XL) 50 MG 24 hr tablet Take 1 tablet (50 mg total) by mouth daily. Take with or immediately following a meal. 90 tablet 3   Multiple Vitamin (MULTIVITAMIN WITH MINERALS) TABS Take 1 tablet by mouth daily.     mupirocin  ointment (BACTROBAN ) 2 % Apply to affected areas tid x 7d 15 g 0   OVER THE COUNTER MEDICATION Take 2 tablets by mouth daily. Energize Caffeine Pills Fast Acting Energy Pill     pantoprazole  (PROTONIX ) 40 MG tablet Take 1 tablet (40 mg total) by mouth daily. 90 tablet 3   promethazine  (PHENERGAN ) 12.5 MG tablet 1-2 tabs po q6h prn nausea 30 tablet 0   sennosides-docusate sodium  (SENOKOT-S) 8.6-50 MG tablet Take 1 tablet by mouth 2 (two) times daily. While taking pain meds to prevent constipation 30 tablet 0   traZODone  (DESYREL ) 50 MG tablet 1 tab p.o. nightly as needed for sleep 30 tablet 1   Wheat Dextrin (BENEFIBER PO) Take by mouth.     oxyCODONE  (OXY IR/ROXICODONE ) 5 MG immediate release tablet 1-2 tabs po q6h prn 90 tablet 0   phentermine  37.5 MG capsule Take 1 capsule (37.5 mg total) by mouth every morning. 30 capsule 5   triamcinolone  ointment (KENALOG ) 0.5 % Apply bid to affected areas as needed 30 g 3   No facility-administered medications prior to visit.    Allergies  Allergen Reactions   Fenofibrate  Rash   Codeine Nausea Only   Dilaudid  [Hydromorphone  Hcl] Nausea And Vomiting    Review of Systems As per HPI  PE:    04/11/2024    2:03 PM 02/17/2024   10:21 AM 02/09/2024   11:42 AM  Vitals with BMI  Height 5' 2 5' 2 5' 2  Weight 193 lbs 13 oz 190 lbs 3 oz 190 lbs 3 oz  BMI 35.44 34.78 34.78  Systolic 132 118 885  Diastolic 74 69 60  Pulse 89 70 85     Physical Exam  General: Alert and well-appearing. Affect is pleasant, thought and speech are lucid. Right forearm extensor surface with 6-8  pink flat but palpable and firm lesions approximately ranging 2 or 3 mm to 2 cm size.  No excoriations.  No tenderness or surrounding erythema.  No crusting.  LABS:  Last CBC Lab Results  Component Value Date   WBC 7.6 01/25/2024   HGB 12.3 01/25/2024   HCT 37.1 01/25/2024   MCV 94.6 01/25/2024   MCH 31.4 01/25/2024   RDW 13.7 01/25/2024   PLT 295 01/25/2024   Last metabolic panel Lab Results  Component Value Date   GLUCOSE 102 (H) 01/25/2024   NA 135 01/25/2024   K 4.3 01/25/2024   CL 102 01/25/2024   CO2 23 01/25/2024   BUN 17 01/25/2024   CREATININE 1.16 (H) 01/25/2024   GFRNONAA 52 (L) 01/25/2024   CALCIUM  8.8 (L) 01/25/2024   PHOS 3.5 02/04/2012   PROT 6.3 12/23/2022   ALBUMIN 4.6 06/17/2021   BILITOT 0.5 12/23/2022   ALKPHOS 73 06/17/2021   AST 17 12/23/2022   ALT 16 12/23/2022   ANIONGAP 10 01/25/2024   Last hemoglobin A1c Lab Results  Component Value Date   HGBA1C 5.8 (H) 12/23/2022   IMPRESSION AND PLAN:  #1 prurigo nodularis. Discussed the waxing and waning nature of this. We will continue triamcinolone  0.5% ointment twice daily to affected areas. Benadryl 1-2 times a day as needed. Will refer to dermatology to see if anything further to add.  2.  Chronic pain syndrome--> fibromyalgia, osteoarthritis multiple sites.  Long-term use of oxycodone , suspect that she has developed some tolerance.  Will increase her dose to a 10 mg tab 3 times a day as needed, #90. She will continue gabapentin  600 mg 3 times daily and Cymbalta  60 mg a day as well as cyclobenzaprine  10 mg 3 times daily as needed. Therapeutic expectations and side effect profiles and potential interactions of these medications was discussed today.  Patient's questions answered.  An After Visit Summary was printed and given to the patient.  FOLLOW UP: Keep appointment set for 05/25/2024  Signed:  Gerlene Hockey, MD           04/11/2024

## 2024-04-16 ENCOUNTER — Other Ambulatory Visit: Payer: Self-pay | Admitting: Family Medicine

## 2024-04-23 ENCOUNTER — Other Ambulatory Visit: Payer: Self-pay | Admitting: Family Medicine

## 2024-05-19 ENCOUNTER — Other Ambulatory Visit: Payer: Self-pay | Admitting: Family Medicine

## 2024-05-19 NOTE — Telephone Encounter (Unsigned)
 Copied from CRM [id number]. Topic: Clinical - Medication Refill >> May 19, 2024  4:01 PM [name] wrote: Medication: phentermine  37.5 MG capsule - patient stated she has been out for a couple of days now would like it as soon as possible   Has the patient contacted their pharmacy? Yes (Agent: If no, request that the patient contact the pharmacy for the refill. If patient does not wish to contact the pharmacy document the reason why and proceed with request.) (Agent: If yes, when and what did the pharmacy advise?)  This is the patient's preferred pharmacy:  [id number] - [place], [place] - [address] [place] [address] Phone: [phone] Fax: [phone] Hours: Not open 24 hours    Is this the correct pharmacy for this prescription? Yes If no, delete pharmacy and type the correct one.   Has the prescription been filled recently? No  Is the patient out of the medication? Yes  Has the patient been seen for an appointment in the last year OR does the patient have an upcoming appointment? Yes  Can we respond through MyChart? Yes  Agent: Please be advised that Rx refills may take up to 3 business days. We ask that you follow-up with your pharmacy.

## 2024-05-20 ENCOUNTER — Telehealth: Payer: Self-pay

## 2024-05-20 NOTE — Telephone Encounter (Signed)
 RF request for Phentermine  LOV: 04/11/24 Next ov: 05/25/24 Last written: 04/11/24 (30,5)   Pt still has refills remaninig, CMA unable to decline due to type of medication. Sending to PCP

## 2024-05-20 NOTE — Telephone Encounter (Signed)
 Copied from CRM [id number]. Topic: Clinical - Medication Question >> May 20, 2024 11:23 AM Revonda D wrote: Reason for CRM: Pt is calling to request a refill for the phentermine  37.5 MG capsule. I informed the pt that a request is currently pending. Pt stated that she needs this medication approved today because she is currently out and can't be off this medication for too long.  Tried calling patient, unable to LVM. Pt had this medication refilled on 9/29 with refills. She should contact the pharmacy to initiate refill

## 2024-05-23 ENCOUNTER — Telehealth: Payer: Self-pay | Admitting: Family Medicine

## 2024-05-23 MED ORDER — OXYCODONE HCL 10 MG PO TABS: ORAL_TABLET | ORAL | 0 refills | Status: DC

## 2024-05-23 NOTE — Telephone Encounter (Signed)
 Detailed message left for pt advising we have sent in a refill, okay per [facility]

## 2024-05-23 NOTE — Telephone Encounter (Signed)
 Ok, rx sent

## 2024-05-23 NOTE — Telephone Encounter (Unsigned)
 Copied from CRM [id number]. Topic: Clinical - Medication Refill >> May 23, 2024  9:48 AM Alfonso HERO wrote: Medication: Oxycodone  HCl 10 MG TABS  Has the patient contacted their pharmacy? No (Agent: If no, request that the patient contact the pharmacy for the refill. If patient does not wish to contact the pharmacy document the reason why and proceed with request.) (Agent: If yes, when and what did the pharmacy advise?)  This is the patient's preferred pharmacy:  [id number] - [place], [place] - [address] [place] [address] Phone: [phone] Fax: [phone]   Is this the correct pharmacy for this prescription? Yes If no, delete pharmacy and type the correct one.   Has the prescription been filled recently? Yes  Is the patient out of the medication? Yes  Has the patient been seen for an appointment in the last year OR does the patient have an upcoming appointment? Yes  Can we respond through MyChart? Yes  Agent: Please be advised that Rx refills may take up to 3 business days. We ask that you follow-up with your pharmacy.

## 2024-05-24 DIAGNOSIS — M8588 Other specified disorders of bone density and structure, other site: Secondary | ICD-10-CM | POA: Diagnosis not present

## 2024-05-25 ENCOUNTER — Ambulatory Visit (INDEPENDENT_AMBULATORY_CARE_PROVIDER_SITE_OTHER): Admitting: Family Medicine

## 2024-05-25 ENCOUNTER — Other Ambulatory Visit: Payer: Self-pay | Admitting: Family Medicine

## 2024-05-25 ENCOUNTER — Encounter: Payer: Self-pay | Admitting: Family Medicine

## 2024-05-25 VITALS — BP 132/68 | HR 74 | Temp 98.5°F | Ht 62.0 in | Wt 196.8 lb

## 2024-05-25 DIAGNOSIS — I10 Essential (primary) hypertension: Secondary | ICD-10-CM

## 2024-05-25 DIAGNOSIS — E66812 Obesity, class 2: Secondary | ICD-10-CM | POA: Diagnosis not present

## 2024-05-25 DIAGNOSIS — M150 Primary generalized (osteo)arthritis: Secondary | ICD-10-CM | POA: Diagnosis not present

## 2024-05-25 DIAGNOSIS — G894 Chronic pain syndrome: Secondary | ICD-10-CM | POA: Diagnosis not present

## 2024-05-25 DIAGNOSIS — E782 Mixed hyperlipidemia: Secondary | ICD-10-CM

## 2024-05-25 DIAGNOSIS — N2889 Other specified disorders of kidney and ureter: Secondary | ICD-10-CM | POA: Diagnosis not present

## 2024-05-25 DIAGNOSIS — M797 Fibromyalgia: Secondary | ICD-10-CM | POA: Diagnosis not present

## 2024-05-25 DIAGNOSIS — R7303 Prediabetes: Secondary | ICD-10-CM | POA: Diagnosis not present

## 2024-05-25 DIAGNOSIS — Z23 Encounter for immunization: Secondary | ICD-10-CM | POA: Diagnosis not present

## 2024-05-25 DIAGNOSIS — Z0000 Encounter for general adult medical examination without abnormal findings: Secondary | ICD-10-CM

## 2024-05-25 MED ORDER — WEGOVY 0.5 MG/0.5ML ~~LOC~~ SOAJ: 0.5000 mg | SUBCUTANEOUS | 0 refills | Status: DC

## 2024-05-25 NOTE — Patient Instructions (Signed)

## 2024-05-25 NOTE — Progress Notes (Signed)
Office Note 05/25/2024  CC: No chief complaint on file.  Patient is a 68 y.o. female who is here for annual health maintenance exam and follow-up hypertension, chronic renal insufficiency, , hyperlipidemia, and chronic pain. A/P as of last visit: 1 prurigo nodularis. Discussed the waxing and waning nature of this. We will continue triamcinolone  0.5% ointment twice daily to affected areas. Benadryl 1-2 times a day as needed. Will refer to dermatology to see if anything further to add.   2.  Chronic pain syndrome--> fibromyalgia, osteoarthritis multiple sites.  Long-term use of oxycodone , suspect that she has developed some tolerance.  Will increase her dose to a 10 mg tab 3 times a day as needed, #90. She will continue gabapentin  600 mg 3 times daily and Cymbalta  60 mg a day as well as cyclobenzaprine  10 mg 3 times daily as needed.  INTERIM HX: [name] is frustrated with her inability to lose weight despite doing a pretty good diet and exercise regimen. She requests GLP-1, is willing to pay out-of-pocket.  Her pain level is improved since increasing oxycodone  to 10 mg 3 times daily as needed.   PMP AWARE reviewed today: most recent rx for phentermine  was filled 05/20/2024, # 10, rx by me.  Most recent oxycodone  10 mg prescription was filled 04/11/2024, #90, prescription by me. No red flags.  Past Medical History:  Diagnosis Date   Bilateral carpal tunnel syndrome    pt to get bilat carpal tunnel release as of 09/2018 ortho eval   Bilateral primary osteoarthritis of knee    L tricompart, R medial compart   BPPV (benign paroxysmal positional vertigo)    Cataract    Chronic pain syndrome    Chronic renal insufficiency, stage III (moderate)    GFR 50's   Fibromyalgia    GERD (gastroesophageal reflux disease)    Heart murmur    History of galactorrhea 02/05/2011   History of kidney cancer    2013   Hyperlipidemia, mixed    Hypertension    IFG (impaired fasting glucose) 2016    HbA1c 5.7-5.8.   Knee pain, bilateral 02/05/2011   Lumbar spondylosis    Migraine syndrome    resolved after menopause   Overweight(278.02)    Pre-diabetes    Prurigo nodularis    Seasonal allergic rhinitis    Solitary left kidney 2013   Aquired: right nephrectomy for renal cancer    Past Surgical History:  Procedure Laterality Date   CARPAL TUNNEL RELEASE Right 09/2018   CESAREAN SECTION  1983   COLONOSCOPY  04/2013   Normal 2014.  01/2022 normal. Recall 80yrs d/t FH CRCA   DEXA     GYN scheduled this at 05/12/19 f/u.   DILATATION & CURRETTAGE/HYSTEROSCOPY WITH RESECTOCOPE N/A 01/25/2024   Procedure: DILATATION & CURETTAGE/HYSTEROSCOPY WITH RESECTOCOPE;  Surgeon: Johnnye Ade, MD;  Location: [facility] OR;  Service: Gynecology;  Laterality: N/A;  MYOSURE LITE   ROBOT ASSISTED LAPAROSCOPIC NEPHRECTOMY  07/12/2012   Procedure: ROBOTIC ASSISTED LAPAROSCOPIC NEPHRECTOMY;  Surgeon: Ricardo Likens, MD;  Location: [facility] ORS;  Service: Urology;  Laterality: Right;   TONSILLECTOMY     TUBAL LIGATION      Family History  Problem Relation Age of Onset   Thyroid  disease Mother    Heart disease Mother        CHF, heart murmur, PAD   Kidney disease Mother        RAS   Other Mother 24       bowel obstruction,surgery this year  Colon cancer Father 52   Cancer Sister        cervical, breast   Thyroid  disease Sister    Thyroid  disease Sister    Diabetes Sister    Diabetes Sister    Hypertension Sister    Cancer Sister        kidney   Heart disease Brother    Cancer Paternal Aunt        breast cancer   Heart disease Maternal Grandmother    Thyroid  disease Maternal Grandmother    Thyroid  disease Maternal Grandfather    Cancer Maternal Grandfather    Diabetes Paternal Grandmother    Cancer Paternal Grandmother        breast   Heart disease Paternal Grandmother    Heart disease Paternal Grandfather    Diabetes Paternal Grandfather    Hypertension Paternal Grandfather    Asthma  Daughter    Allergies Daughter    Lung disease Daughter        recurrent bronchitis   Lung disease Son        recurrent bronchitis   Colon cancer Paternal Great-grandmother    Crohn's disease Neg Hx    Esophageal cancer Neg Hx    Rectal cancer Neg Hx    Stomach cancer Neg Hx     Social History   Socioeconomic History   Marital status: Married    Spouse name: Milderd   Number of children: 3   Years of education: Not on file   Highest education level: Not on file  Occupational History   Not on file  Tobacco Use   Smoking status: Former    Current packs/day: 0.00    Average packs/day: 2.0 packs/day for 5.0 years (10.0 ttl pk-yrs)    Types: Cigarettes    Start date: 07/14/1976    Quit date: 07/14/1981    Years since quitting: 42.8    Passive exposure: Never   Smokeless tobacco: Never  Vaping Use   Vaping status: Never Used  Substance and Sexual Activity   Alcohol use: No   Drug use: No   Sexual activity: Yes  Other Topics Concern   Not on file  Social History Narrative   Married, 3 children.   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   No tobacco, alcohol, or drugs.  No exercise.   Social Drivers of Corporate Investment Banker Strain: Not on file  Food Insecurity: Not on file  Transportation Needs: Not on file  Physical Activity: Not on file  Stress: Not on file  Social Connections: Not on file  Intimate Partner Violence: Not on file    Outpatient Medications Prior to Visit  Medication Sig Dispense Refill   acetaminophen  (TYLENOL ) 500 MG tablet Take 1,000 mg by mouth every 8 (eight) hours as needed.     albuterol  (VENTOLIN  HFA) 108 (90 Base) MCG/ACT inhaler INHALE 2 PUFFS INTO THE LUNGS EVERY 6 HOURS AS NEEDED FOR WHEEZE OR SHORTNESS OF BREATH 18 g 0   amLODipine  (NORVASC ) 10 MG tablet Take 1 tablet (10 mg total) by mouth daily. 90  tablet 3   atorvastatin  (LIPITOR) 80 MG tablet Take 1 tablet (80 mg total) by mouth daily. 90 tablet 3   cyclobenzaprine  (FLEXERIL ) 10 MG tablet TAKE 1 TABLET BY MOUTH THREE TIMES A DAY AS NEEDED 90 tablet 5   diphenhydramine-acetaminophen  (TYLENOL  PM) 25-500  MG TABS Take 1 tablet by mouth at bedtime as needed.     DULoxetine  (CYMBALTA ) 60 MG capsule Take 1 capsule (60 mg total) by mouth daily. TAKE 1 CAPSULE BY MOUTH EVERY DAY 90 capsule 3   gabapentin  (NEURONTIN ) 300 MG capsule TAKE 2 CAPSULES BY MOUTH 3 TIMES A DAY 540 capsule 1   Melatonin-Theanine (3 AM MELATONIN SLEEP PO) Take by mouth at bedtime. Over the counter     metoprolol  succinate (TOPROL -XL) 50 MG 24 hr tablet TAKE 1 TABLET BY MOUTH DAILY. TAKE WITH OR IMMEDIATELY FOLLOWING A MEAL. 90 tablet 0   Multiple Vitamin (MULTIVITAMIN WITH MINERALS) TABS Take 1 tablet by mouth daily.     mupirocin  ointment (BACTROBAN ) 2 % Apply to affected areas tid x 7d 15 g 0   OVER THE COUNTER MEDICATION Take 2 tablets by mouth daily. Energize Caffeine Pills Fast Acting Energy Pill     Oxycodone  HCl 10 MG TABS 1 tab every 8 hours as needed 90 tablet 0   pantoprazole  (PROTONIX ) 40 MG tablet Take 1 tablet (40 mg total) by mouth daily. 90 tablet 3   phentermine  37.5 MG capsule Take 1 capsule (37.5 mg total) by mouth every morning. 30 capsule 5   promethazine  (PHENERGAN ) 12.5 MG tablet 1-2 tabs po q6h prn nausea 30 tablet 0   sennosides-docusate sodium  (SENOKOT-S) 8.6-50 MG tablet Take 1 tablet by mouth 2 (two) times daily. While taking pain meds to prevent constipation 30 tablet 0   traZODone  (DESYREL ) 50 MG tablet TAKE 1 TABLET BY MOUTH NIGHTLY AS NEEDED FOR SLEEP 30 tablet 1   triamcinolone  ointment (KENALOG ) 0.5 % Apply bid to affected areas as needed 30 g 3   Wheat Dextrin (BENEFIBER PO) Take by mouth.     No facility-administered medications prior to visit.    Allergies  Allergen Reactions   Fenofibrate  Rash   Codeine Nausea Only   Dilaudid   [Hydromorphone  Hcl] Nausea And Vomiting    Review of Systems  Constitutional:  Negative for appetite change, chills, fatigue and fever.  HENT:  Negative for congestion, dental problem, ear pain and sore throat.   Eyes:  Negative for discharge, redness and visual disturbance.  Respiratory:  Negative for cough, chest tightness, shortness of breath and wheezing.   Cardiovascular:  Negative for chest pain, palpitations and leg swelling.  Gastrointestinal:  Negative for abdominal pain, blood in stool, diarrhea, nausea and vomiting.  Genitourinary:  Negative for difficulty urinating, dysuria, flank pain, frequency, hematuria and urgency.  Musculoskeletal:  Positive for arthralgias, back pain, myalgias and neck pain. Negative for joint swelling and neck stiffness.  Skin:  Negative for pallor and rash.  Neurological:  Negative for dizziness, speech difficulty, weakness and headaches.  Hematological:  Negative for adenopathy. Does not bruise/bleed easily.  Psychiatric/Behavioral:  Negative for confusion and sleep disturbance. The patient is not nervous/anxious.     PE;    05/25/2024    1:06 PM 05/25/2024    1:05 PM 04/11/2024    2:03 PM  Vitals with BMI  Height  5' 2 5' 2  Weight  196 lbs 13 oz 193 lbs 13 oz  BMI  35.99 35.44  Systolic 132 144 867  Diastolic 68 75 74  Pulse  74 89   Gen: Alert, well appearing.  Patient is oriented to person, place, time, and situation. AFFECT: pleasant, lucid thought and speech. ENT: Ears: EACs clear, normal epithelium.  TMs with good light reflex and landmarks bilaterally.  Eyes: no injection,  icteris, swelling, or exudate.  EOMI, PERRLA. Nose: no drainage or turbinate edema/swelling.  No injection or focal lesion.  Mouth: lips without lesion/swelling.  Oral mucosa pink and moist.  Dentition intact and without obvious caries or gingival swelling.  Oropharynx without erythema, exudate, or swelling.  Neck: supple/nontender.  No LAD, mass, or TM.  Carotid  pulses 2+ bilaterally, without bruits. CV: RRR, no m/r/g.   LUNGS: CTA bilat, nonlabored resps, good aeration in all lung fields. ABD: soft, NT, ND EXT: no clubbing, cyanosis, or edema.  Musculoskeletal: no joint swelling, erythema, warmth, or tenderness.  ROM of all joints intact. Skin - no sores or suspicious lesions or rashes or color changes  Pertinent labs:  Lab Results  Component Value Date   TSH 0.93 06/17/2021   Lab Results  Component Value Date   WBC 7.6 01/25/2024   HGB 12.3 01/25/2024   HCT 37.1 01/25/2024   MCV 94.6 01/25/2024   PLT 295 01/25/2024   Lab Results  Component Value Date   CREATININE 1.16 (H) 01/25/2024   BUN 17 01/25/2024   NA 135 01/25/2024   K 4.3 01/25/2024   CL 102 01/25/2024   CO2 23 01/25/2024   Lab Results  Component Value Date   ALT 16 12/23/2022   AST 17 12/23/2022   ALKPHOS 73 06/17/2021   BILITOT 0.5 12/23/2022   Lab Results  Component Value Date   CHOL 177 12/23/2022   Lab Results  Component Value Date   HDL 69 12/23/2022   Lab Results  Component Value Date   LDLCALC 76 12/23/2022   Lab Results  Component Value Date   TRIG 219 (H) 12/23/2022   Lab Results  Component Value Date   CHOLHDL 2.6 12/23/2022   Lab Results  Component Value Date   HGBA1C 5.8 (H) 12/23/2022   ASSESSMENT AND PLAN:   No problem-specific Assessment & Plan notes found for this encounter.  #1 health maintenance exam: Reviewed age and gender appropriate health maintenance issues (prudent diet, regular exercise, health risks of tobacco and excessive alcohol, use of seatbelts, fire alarms in home, use of sunscreen).  Also reviewed age and gender appropriate health screening as well as vaccine recommendations. Vaccines: Prevnar 20-->deferred today.  Labs: cmet, A1c, lipids Cervical ca screening: UTD Per GYN, Dr. Johnnye. Breast ca screening: UTD Per GYN, Dr. Johnnye. DEXA: UTD per gyn-->just recently done, will get records. Colon ca screening:  recall 01/2027  #2 hypertension, well-controlled on Toprol -XL 50 mg a day and amlodipine  10 mg a day. Monitor electrolytes and creatinine today.  3.  Hypercholesterolemia, doing well on atorvastatin  80 mg a day. Lipid panel and hepatic panel today.  4.  Chronic renal insufficiency stage III, solitary kidney. She avoids NSAIDs. She hydrates well. Monitor kidney function today.  5.  Chronic pain syndrome--> fibromyalgia and osteoarthritis of multiple sites. Good control now on oxycodone  10 mg 3 times daily as needed. A new prescription was not needed today.  #6 obesity, class II. She is working on consolidated edison and exercise.  She has a nutrition class coming up. She will see if she can afford GLP-1 out-of-pocket--> Wegovy 0.5 mg weekly. Therapeutic expectations and side effect profile of medication discussed today.  Patient's questions answered.  An After Visit Summary was printed and given to the patient.  FOLLOW UP:  Return in about 4 weeks (around 06/22/2024) for routine chronic illness f/u.  Signed:  Phil Rozanna Cormany, MD  05/25/2024  

## 2024-05-26 ENCOUNTER — Ambulatory Visit: Payer: Self-pay | Admitting: Family Medicine

## 2024-05-26 LAB — COMPREHENSIVE METABOLIC PANEL WITH GFR
AG Ratio: 1.8 (calc) (ref 1.0–2.5)
ALT: 31 U/L — ABNORMAL HIGH (ref 6–29)
AST: 28 U/L (ref 10–35)
Albumin: 4.2 g/dL (ref 3.6–5.1)
Alkaline phosphatase (APISO): 90 U/L (ref 37–153)
BUN/Creatinine Ratio: 11 (calc) (ref 6–22)
BUN: 12 mg/dL (ref 7–25)
CO2: 28 mmol/L (ref 20–32)
Calcium: 9.3 mg/dL (ref 8.6–10.4)
Chloride: 104 mmol/L (ref 98–110)
Creat: 1.14 mg/dL — ABNORMAL HIGH (ref 0.50–1.05)
Globulin: 2.4 g/dL (ref 1.9–3.7)
Glucose, Bld: 108 mg/dL — ABNORMAL HIGH (ref 65–99)
Potassium: 5 mmol/L (ref 3.5–5.3)
Sodium: 138 mmol/L (ref 135–146)
Total Bilirubin: 0.4 mg/dL (ref 0.2–1.2)
Total Protein: 6.6 g/dL (ref 6.1–8.1)
eGFR: 53 mL/min/1.73m2 — ABNORMAL LOW (ref >=60)

## 2024-05-26 LAB — HEMOGLOBIN A1C
Hgb A1c MFr Bld: 5.8 % — ABNORMAL HIGH (ref <5.7)
Mean Plasma Glucose: 120 mg/dL
eAG (mmol/L): 6.6 mmol/L

## 2024-05-26 LAB — LIPID PANEL
Cholesterol: 216 mg/dL — ABNORMAL HIGH (ref <200)
HDL: 66 mg/dL (ref >=50)
LDL Cholesterol (Calc): 111 mg/dL — ABNORMAL HIGH
Non-HDL Cholesterol (Calc): 150 mg/dL — ABNORMAL HIGH (ref <130)
Total CHOL/HDL Ratio: 3.3 (calc) (ref <5.0)
Triglycerides: 274 mg/dL — ABNORMAL HIGH (ref <150)

## 2024-05-27 MED ORDER — SEMAGLUTIDE(0.25 OR 0.5MG/DOS) 2 MG/3ML ~~LOC~~ SOPN: PEN_INJECTOR | SUBCUTANEOUS | 0 refills | Status: AC

## 2024-05-27 NOTE — Telephone Encounter (Signed)
 Ok, ozempic rx sent

## 2024-05-31 ENCOUNTER — Other Ambulatory Visit (HOSPITAL_COMMUNITY): Payer: Self-pay

## 2024-05-31 ENCOUNTER — Telehealth: Payer: Self-pay

## 2024-05-31 NOTE — Telephone Encounter (Signed)
 Pharmacy Patient Advocate Encounter   Received notification from Onbase that prior authorization for Ozempic (0.25 or 0.5 MG/DOSE) 2MG /3ML pen-injectors  is required/requested.   Insurance verification completed.   The patient is insured through [facility].   Per test claim: PA required; PA submitted to above mentioned insurance via Latent Key/confirmation #/EOC [id number] Status is pending

## 2024-06-01 ENCOUNTER — Ambulatory Visit: Admitting: Physician Assistant

## 2024-06-02 DIAGNOSIS — M9902 Segmental and somatic dysfunction of thoracic region: Secondary | ICD-10-CM | POA: Diagnosis not present

## 2024-06-02 DIAGNOSIS — M9901 Segmental and somatic dysfunction of cervical region: Secondary | ICD-10-CM | POA: Diagnosis not present

## 2024-06-02 DIAGNOSIS — M62830 Muscle spasm of back: Secondary | ICD-10-CM | POA: Diagnosis not present

## 2024-06-02 DIAGNOSIS — M9903 Segmental and somatic dysfunction of lumbar region: Secondary | ICD-10-CM | POA: Diagnosis not present

## 2024-06-02 NOTE — Telephone Encounter (Signed)
 PLEASE BE ADVISED APPROVAL LETTER HAS BEEN SCANNED IN MEDIA OF CHART

## 2024-06-02 NOTE — Telephone Encounter (Signed)
 Pharmacy Patient Advocate Encounter  Received notification from [facility] MEDICARE that Prior Authorization for {Ozempic (0.25 or 0.5 MG/DOSE) 2MG /3ML pen-injectors has been APPROVED from 06/02/2024 to 07/13/2024 ONLY APPROVED UNTIL THE END OF YEAR PT MAY HAVE ENROLLED IN NEW PLAN FOR 2026 MEDICARE PLANS   PA #/Case ID/Reference #: [id number]

## 2024-06-04 ENCOUNTER — Other Ambulatory Visit: Payer: Self-pay | Admitting: Family Medicine

## 2024-06-06 DIAGNOSIS — M9902 Segmental and somatic dysfunction of thoracic region: Secondary | ICD-10-CM | POA: Diagnosis not present

## 2024-06-06 DIAGNOSIS — M62830 Muscle spasm of back: Secondary | ICD-10-CM | POA: Diagnosis not present

## 2024-06-06 DIAGNOSIS — M9901 Segmental and somatic dysfunction of cervical region: Secondary | ICD-10-CM | POA: Diagnosis not present

## 2024-06-06 DIAGNOSIS — M9903 Segmental and somatic dysfunction of lumbar region: Secondary | ICD-10-CM | POA: Diagnosis not present

## 2024-06-07 DIAGNOSIS — M9901 Segmental and somatic dysfunction of cervical region: Secondary | ICD-10-CM | POA: Diagnosis not present

## 2024-06-07 DIAGNOSIS — M62830 Muscle spasm of back: Secondary | ICD-10-CM | POA: Diagnosis not present

## 2024-06-07 DIAGNOSIS — M9902 Segmental and somatic dysfunction of thoracic region: Secondary | ICD-10-CM | POA: Diagnosis not present

## 2024-06-07 DIAGNOSIS — M9903 Segmental and somatic dysfunction of lumbar region: Secondary | ICD-10-CM | POA: Diagnosis not present

## 2024-06-13 DIAGNOSIS — M9903 Segmental and somatic dysfunction of lumbar region: Secondary | ICD-10-CM | POA: Diagnosis not present

## 2024-06-13 DIAGNOSIS — M9901 Segmental and somatic dysfunction of cervical region: Secondary | ICD-10-CM | POA: Diagnosis not present

## 2024-06-13 DIAGNOSIS — M9902 Segmental and somatic dysfunction of thoracic region: Secondary | ICD-10-CM | POA: Diagnosis not present

## 2024-06-13 DIAGNOSIS — M62830 Muscle spasm of back: Secondary | ICD-10-CM | POA: Diagnosis not present

## 2024-06-15 DIAGNOSIS — Z6835 Body mass index (BMI) 35.0-35.9, adult: Secondary | ICD-10-CM | POA: Diagnosis not present

## 2024-06-20 DIAGNOSIS — M9901 Segmental and somatic dysfunction of cervical region: Secondary | ICD-10-CM | POA: Diagnosis not present

## 2024-06-20 DIAGNOSIS — M62830 Muscle spasm of back: Secondary | ICD-10-CM | POA: Diagnosis not present

## 2024-06-20 DIAGNOSIS — M9902 Segmental and somatic dysfunction of thoracic region: Secondary | ICD-10-CM | POA: Diagnosis not present

## 2024-06-20 DIAGNOSIS — M9903 Segmental and somatic dysfunction of lumbar region: Secondary | ICD-10-CM | POA: Diagnosis not present

## 2024-06-22 ENCOUNTER — Encounter: Payer: Self-pay | Admitting: Physician Assistant

## 2024-06-22 ENCOUNTER — Ambulatory Visit: Admitting: Physician Assistant

## 2024-06-22 VITALS — BP 110/66

## 2024-06-22 DIAGNOSIS — L281 Prurigo nodularis: Secondary | ICD-10-CM

## 2024-06-22 DIAGNOSIS — L280 Lichen simplex chronicus: Secondary | ICD-10-CM | POA: Diagnosis not present

## 2024-06-22 DIAGNOSIS — D225 Melanocytic nevi of trunk: Secondary | ICD-10-CM | POA: Diagnosis not present

## 2024-06-22 DIAGNOSIS — D229 Melanocytic nevi, unspecified: Secondary | ICD-10-CM

## 2024-06-22 MED ORDER — CLOBETASOL PROPIONATE 0.05 % EX OINT: 1.0000 | TOPICAL_OINTMENT | Freq: Two times a day (BID) | CUTANEOUS | 1 refills | Status: AC

## 2024-06-22 NOTE — Progress Notes (Signed)
° °  New Patient Visit   Subjective  [name] is a 68 y.o. female NEW PATIENT who presents for the following: She started having a rash over a year ago. Her PCP did a biopsy 02/09/2024 and it was c/w prurigo nodularis. She has active areas on her right arm today. She has used TMC cream but it does not help. The spots are painful when they flare.  She has a mole on her chest that came up in her 78s. She worries that it will turn into something.    The following portions of the chart were reviewed this encounter and updated as appropriate: medications, allergies, medical history  Review of Systems:  No other skin or systemic complaints except as noted in HPI or Assessment and Plan.  Objective  Well appearing patient in no apparent distress; mood and affect are within normal limits.   A focused examination was performed of the following areas: Arms, legs, chest   Relevant exam findings are noted in the Assessment and Plan.    Assessment & Plan   BENIGN APPEARING NEVUS - chest  - normal appearing  - reassurance provided     PRURIGO NODULARIS/LICHEN SIMPLEX CHRONICUS Exam: Excoriated papules and nodules at arms, legs   Lichen simplex chronicus (LSC) is a persistent itchy area of thickened skin that is induced by chronic rubbing and/or scratching (chronic dermatitis).  These areas may be pink, hyperpigmented and may have excoriations and bumps (prurigo nodules-PN).  PN/LSC is commonly observed in uncontrolled atopic dermatitis and other forms of eczema, and in other itchy skin conditions (eg, insect bites, scabies).  Sometimes it is not possible to know initial cause of PN/LSC if it has been present for a long time.  It generally responds well to treatment with high potency topical steroids.  It is important to stop rubbing/scratching the area in order to break the itch-scratch-rash-itch cycle, in order for the rash to resolve.   Treatment Plan: Clobetasol ointment Apply to  affected areas twice daily until clear.  Avoid picking/rubbing/scratching   PRURIGO NODULARIS   LICHEN SIMPLEX CHRONICUS   BENIGN NEVUS    Return if symptoms worsen or fail to improve.  I, Roseline Hutchinson, CMA, am acting as scribe for Desira Alessandrini K, PA-C .   Documentation: I have reviewed the above documentation for accuracy and completeness, and I agree with the above.  Ailea Rhatigan K, PA-C

## 2024-06-22 NOTE — Patient Instructions (Signed)

## 2024-06-23 ENCOUNTER — Ambulatory Visit: Admitting: Family Medicine

## 2024-06-24 ENCOUNTER — Other Ambulatory Visit: Payer: Self-pay | Admitting: Family Medicine

## 2024-06-24 NOTE — Telephone Encounter (Unsigned)
 Copied from CRM #[id number]. Topic: Clinical - Medication Refill >> Jun 24, 2024 12:57 PM Victoria A wrote: Medication: Oxycodone  HCl 10 MG TABS  Has the patient contacted their pharmacy? Yes (Agent: If no, request that the patient contact the pharmacy for the refill. If patient does not wish to contact the pharmacy document the reason why and proceed with request.) (Agent: If yes, when and what did the pharmacy advise?) Call Primary  This is the patient's preferred pharmacy:  [id number] [place] [place] - [address] [place] [address] Phone: [phone] Fax: [phone]   Is this the correct pharmacy for this prescription? Yes If no, delete pharmacy and type the correct one.   Has the prescription been filled recently? No  Is the patient out of the medication? No Couple pills left  Has the patient been seen for an appointment in the last year OR does the patient have an upcoming appointment? Yes  Can we respond through MyChart? Yes  Agent: Please be advised that Rx refills may take up to 3 business days. We ask that you follow-up with your pharmacy.

## 2024-06-27 MED ORDER — OXYCODONE HCL 10 MG PO TABS: ORAL_TABLET | ORAL | 0 refills | Status: DC

## 2024-06-27 NOTE — Telephone Encounter (Signed)
 Requesting: oxycodone  Contract: 10/27/23 UDS: 07/17/23 Last Visit: 05/25/24 Next Visit: 08/17/24 Last Refill:  05/23/24 (90,0)  Please Advise. Rx pending

## 2024-06-29 ENCOUNTER — Ambulatory Visit

## 2024-07-26 ENCOUNTER — Other Ambulatory Visit: Payer: Self-pay | Admitting: Family Medicine

## 2024-07-26 MED ORDER — OXYCODONE HCL 10 MG PO TABS: ORAL_TABLET | ORAL | 0 refills | Status: AC

## 2024-07-26 NOTE — Telephone Encounter (Signed)
 Copied from CRM [id number]. Topic: Clinical - Medication Refill >> Jul 26, 2024  3:37 PM Mesmerise C wrote: Medication: Oxycodone  HCl 10 MG TABS  Has the patient contacted their pharmacy? No (Agent: If no, request that the patient contact the pharmacy for the refill. If patient does not wish to contact the pharmacy document the reason why and proceed with request.) (Agent: If yes, when and what did the pharmacy advise?)  This is the patient's preferred pharmacy:  [id number] [place], [place] - [address] [place] [address] Phone: [phone] Fax: [phone]  Is this the correct pharmacy for this prescription? Yes If no, delete pharmacy and type the correct one.   Has the prescription been filled recently? No  Is the patient out of the medication? No  Has the patient been seen for an appointment in the last year OR does the patient have an upcoming appointment? Yes  Can we respond through MyChart? Yes  Agent: Please be advised that Rx refills may take up to 3 business days. We ask that you follow-up with your pharmacy.

## 2024-07-26 NOTE — Telephone Encounter (Signed)
 Requesting: oxycodone  Contract: 10/27/23 UDS: 07/17/23 Last Visit: 05/25/24 Next Visit: 08/17/24 Last Refill: 06/27/24 (90,0)  Please Advise. Rx pending

## 2024-07-27 NOTE — Telephone Encounter (Signed)
 Detailed message left advising pt refill sent to pharmacy on file.

## 2024-07-31 ENCOUNTER — Other Ambulatory Visit: Payer: Self-pay | Admitting: Family Medicine

## 2024-08-04 ENCOUNTER — Ambulatory Visit: Admitting: Student

## 2024-08-04 ENCOUNTER — Ambulatory Visit: Payer: Self-pay

## 2024-08-04 NOTE — Telephone Encounter (Signed)
 Pt rescheduled due to having lost her wallet. Pt scheduled for tomorrow.

## 2024-08-04 NOTE — Telephone Encounter (Signed)
 FYI Only or Action Required?: FYI only for provider: appointment scheduled on 08/04/24 at [facility].  Patient was last seen in primary care on 05/25/2024 by McGowen, Aleene DEL, MD.  Called Nurse Triage reporting Sore Throat.  Symptoms began a week ago.  Interventions attempted: OTC medications: Tylenol , Oral B mouth sore wash and Prescription medications: Oxycodone .  Symptoms are: unchanged.  Triage Disposition: See Physician Within 24 Hours  Patient/caregiver understands and will follow disposition?: Yes   Reason for Disposition  Earache also present  Answer Assessment - Initial Assessment Questions Oral B mouth sore solution, tylenol , oxycodone   1. ONSET: When did the throat start hurting? (Hours or days ago)      A week  2. SEVERITY: How bad is the sore throat? (Scale 1-10; mild, moderate or severe)     5/10  3. FEVER: Do you have a fever? If Yes, ask: What is your temperature, how was it measured, and when did it start?     Denies  4. OTHER SYMPTOMS: Do you have any other symptoms? (e.g., difficulty breathing, headache, rash)     Headache on and off for a week, left ear ache started yesterday, sinus pressure, small red bumps on tongue, painful. mild nausea, abd pain, denies vomiting or diarrhea. Denies SOB or chest pain  Protocols used: Sore Throat-A-AH  Message from [place] F sent at 08/04/2024 10:20 AM EST  Reason for Triage: Patient having ear pain, headache, pressure and sore throat/tongue, swelling

## 2024-08-05 ENCOUNTER — Other Ambulatory Visit (HOSPITAL_COMMUNITY): Payer: Self-pay

## 2024-08-05 ENCOUNTER — Ambulatory Visit: Payer: Self-pay | Admitting: Student

## 2024-08-15 ENCOUNTER — Telehealth: Payer: Self-pay | Admitting: Pharmacy Technician

## 2024-08-15 ENCOUNTER — Other Ambulatory Visit (HOSPITAL_COMMUNITY): Payer: Self-pay

## 2024-08-15 NOTE — Telephone Encounter (Signed)
 Prior Authorization form/request asks a question that requires your assistance. Please see the question below and advise accordingly. The PA will not be submitted until the necessary information is received.   Is the patient  currently being treated with Ozempic  for type 2 diabetes?

## 2024-08-15 NOTE — Telephone Encounter (Signed)
 Please advise.

## 2024-08-16 NOTE — Telephone Encounter (Signed)
 LM for pt to return call to discuss medication

## 2024-08-17 ENCOUNTER — Ambulatory Visit: Admitting: Family Medicine

## 2024-08-17 NOTE — Telephone Encounter (Signed)
 Spoke with pt, advised medication is not covered by insurance due to no diabetes diagnosis. She will contact her insurance to see if they have something else for specific for weight loss.

## 2024-08-17 NOTE — Progress Notes (Unsigned)
 OFFICE VISIT  08/17/2024  CC: No chief complaint on file.   Patient is a 69 y.o. female who presents for 35-month follow-up hypertension, chronic renal insufficiency, weight management, and chronic pain syndrome. A/P as of last visit: 1 hypertension, well-controlled on Toprol -XL 50 mg a day and amlodipine  10 mg a day. Monitor electrolytes and creatinine today.   2.  Hypercholesterolemia, doing well on atorvastatin  80 mg a day. Lipid panel and hepatic panel today.   3.  Chronic renal insufficiency stage III, solitary kidney. She avoids NSAIDs. She hydrates well. Monitor kidney function today.   4.  Chronic pain syndrome--> fibromyalgia and osteoarthritis of multiple sites.  Good control now on oxycodone  10 mg 3 times daily as needed. A new prescription was not needed today.   #5 obesity, class II. She is working on consolidated edison and exercise.  She has a nutrition class coming up. She will see if she can afford GLP-1 out-of-pocket--> Wegovy  0.5 mg weekly.  INTERIM HX: ***   PMP AWARE reviewed today: most recent rx for oxycodone  10 mg was filled 07/26/2024, # 90, rx by me.  Most recent phentermine  prescription was filled 07/19/2024, #30, prescription by me.  Most recent gabapentin  prescription was filled 06/06/2024, #540, prescription by me. No red flags.  Past Medical History:  Diagnosis Date   Bilateral carpal tunnel syndrome    pt to get bilat carpal tunnel release as of 09/2018 ortho eval   Bilateral primary osteoarthritis of knee    L tricompart, R medial compart   BPPV (benign paroxysmal positional vertigo)    Cataract    Chronic pain syndrome    Chronic renal insufficiency, stage III (moderate)    GFR 50's   Fibromyalgia    GERD (gastroesophageal reflux disease)    Heart murmur    History of galactorrhea 02/05/2011   History of kidney cancer    2013   Hyperlipidemia, mixed    Hypertension    IFG (impaired fasting glucose) 2016   HbA1c 5.7-5.8.   Knee pain,  bilateral 02/05/2011   Lumbar spondylosis    Migraine syndrome    resolved after menopause   Overweight(278.02)    Pre-diabetes    Prurigo nodularis    Seasonal allergic rhinitis    Solitary left kidney 2013   Aquired: right nephrectomy for renal cancer    Past Surgical History:  Procedure Laterality Date   CARPAL TUNNEL RELEASE Right 09/2018   CESAREAN SECTION  1983   COLONOSCOPY  04/2013   Normal 2014.  01/2022 normal. Recall 46yrs d/t FH CRCA   DEXA     GYN scheduled this at 05/12/19 f/u.   DILATATION & CURRETTAGE/HYSTEROSCOPY WITH RESECTOCOPE N/A 01/25/2024   Procedure: DILATATION & CURETTAGE/HYSTEROSCOPY WITH RESECTOCOPE;  Surgeon: Johnnye Ade, MD;  Location: [facility] OR;  Service: Gynecology;  Laterality: N/A;  MYOSURE LITE   ROBOT ASSISTED LAPAROSCOPIC NEPHRECTOMY  07/12/2012   Procedure: ROBOTIC ASSISTED LAPAROSCOPIC NEPHRECTOMY;  Surgeon: Ricardo Likens, MD;  Location: [facility] ORS;  Service: Urology;  Laterality: Right;   TONSILLECTOMY     TUBAL LIGATION      Outpatient Medications Prior to Visit  Medication Sig Dispense Refill   acetaminophen  (TYLENOL ) 500 MG tablet Take 1,000 mg by mouth every 8 (eight) hours as needed.     albuterol  (VENTOLIN  HFA) 108 (90 Base) MCG/ACT inhaler INHALE 2 PUFFS INTO THE LUNGS EVERY 6 HOURS AS NEEDED FOR WHEEZE OR SHORTNESS OF BREATH 18 g 0   amLODipine  (NORVASC ) 10 MG tablet Take  1 tablet (10 mg total) by mouth daily. 90 tablet 3   atorvastatin  (LIPITOR) 80 MG tablet Take 1 tablet (80 mg total) by mouth daily. 90 tablet 3   clobetasol  ointment (TEMOVATE ) 0.05 % Apply 1 Application topically 2 (two) times daily. Apply to affected areas twice daily until clear 60 g 1   cyclobenzaprine  (FLEXERIL ) 10 MG tablet TAKE 1 TABLET BY MOUTH THREE TIMES A DAY AS NEEDED 90 tablet 5   diphenhydramine-acetaminophen  (TYLENOL  PM) 25-500 MG TABS Take 1 tablet by mouth at bedtime as needed.     DULoxetine  (CYMBALTA ) 60 MG capsule Take 1 capsule (60 mg total) by  mouth daily. TAKE 1 CAPSULE BY MOUTH EVERY DAY 90 capsule 3   gabapentin  (NEURONTIN ) 300 MG capsule TAKE 2 CAPSULES BY MOUTH 3 TIMES A DAY 540 capsule 0   Melatonin-Theanine (3 AM MELATONIN SLEEP PO) Take by mouth at bedtime. Over the counter     metoprolol  succinate (TOPROL -XL) 50 MG 24 hr tablet TAKE 1 TABLET BY MOUTH DAILY. TAKE WITH OR IMMEDIATELY FOLLOWING A MEAL. 30 tablet 0   Multiple Vitamin (MULTIVITAMIN WITH MINERALS) TABS Take 1 tablet by mouth daily.     mupirocin  ointment (BACTROBAN ) 2 % Apply to affected areas tid x 7d 15 g 0   OVER THE COUNTER MEDICATION Take 2 tablets by mouth daily. Energize Caffeine Pills Fast Acting Energy Pill     Oxycodone  HCl 10 MG TABS 1 tab every 8 hours as needed 90 tablet 0   pantoprazole  (PROTONIX ) 40 MG tablet Take 1 tablet (40 mg total) by mouth daily. 90 tablet 3   phentermine  37.5 MG capsule Take 1 capsule (37.5 mg total) by mouth every morning. 30 capsule 5   promethazine  (PHENERGAN ) 12.5 MG tablet 1-2 tabs po q6h prn nausea 30 tablet 0   Semaglutide ,0.25 or 0.5MG /DOS, 2 MG/3ML SOPN 0.5mg  SQ q7d 3 mL 0   sennosides-docusate sodium  (SENOKOT-S) 8.6-50 MG tablet Take 1 tablet by mouth 2 (two) times daily. While taking pain meds to prevent constipation 30 tablet 0   traZODone  (DESYREL ) 50 MG tablet TAKE 1 TABLET BY MOUTH NIGHTLY AS NEEDED FOR SLEEP 30 tablet 1   triamcinolone  ointment (KENALOG ) 0.5 % Apply bid to affected areas as needed 30 g 3   Wheat Dextrin (BENEFIBER PO) Take by mouth.     No facility-administered medications prior to visit.    Allergies[1]  Review of Systems As per HPI  PE:    06/22/2024    8:36 AM 05/25/2024    1:06 PM 05/25/2024    1:05 PM  Vitals with BMI  Height   5' 2  Weight   196 lbs 13 oz  BMI   35.99  Systolic 110 132 855  Diastolic 66 68 75  Pulse   74    Physical Exam  ***  LABS:  Last CBC Lab Results  Component Value Date   WBC 7.6 01/25/2024   HGB 12.3 01/25/2024   HCT 37.1 01/25/2024    MCV 94.6 01/25/2024   MCH 31.4 01/25/2024   RDW 13.7 01/25/2024   PLT 295 01/25/2024   Last metabolic panel Lab Results  Component Value Date   GLUCOSE 108 (H) 05/25/2024   NA 138 05/25/2024   K 5.0 05/25/2024   CL 104 05/25/2024   CO2 28 05/25/2024   BUN 12 05/25/2024   CREATININE 1.14 (H) 05/25/2024   EGFR 53 (L) 05/25/2024   CALCIUM  9.3 05/25/2024   PHOS 3.5 02/04/2012  PROT 6.6 05/25/2024   ALBUMIN 4.6 06/17/2021   BILITOT 0.4 05/25/2024   ALKPHOS 73 06/17/2021   AST 28 05/25/2024   ALT 31 (H) 05/25/2024   ANIONGAP 10 01/25/2024   Last lipids Lab Results  Component Value Date   CHOL 216 (H) 05/25/2024   HDL 66 05/25/2024   LDLCALC 111 (H) 05/25/2024   LDLDIRECT 125.0 06/17/2021   TRIG 274 (H) 05/25/2024   CHOLHDL 3.3 05/25/2024   Last hemoglobin A1c Lab Results  Component Value Date   HGBA1C 5.8 (H) 05/25/2024   Last thyroid  functions Lab Results  Component Value Date   TSH 0.93 06/17/2021   FREET4 1.16 01/31/2011   IMPRESSION AND PLAN:  No problem-specific Assessment & Plan notes found for this encounter.   An After Visit Summary was printed and given to the patient.  FOLLOW UP: No follow-ups on file. Next CPE November 2026 Signed:  Gerlene Hockey, MD           08/17/2024      [1]  Allergies Allergen Reactions   Fenofibrate  Rash   Codeine Nausea Only   Dilaudid  [Hydromorphone  Hcl] Nausea And Vomiting

## 2024-08-29 ENCOUNTER — Ambulatory Visit: Admitting: Family Medicine
